# Patient Record
Sex: Male | Born: 1960
Health system: Southern US, Community
[De-identification: ages and names within clinical notes are randomized; demographics above are authoritative.]

## PROBLEM LIST (undated history)

## (undated) DIAGNOSIS — I10 Essential (primary) hypertension: Secondary | ICD-10-CM

## (undated) DIAGNOSIS — R5383 Other fatigue: Principal | ICD-10-CM

## (undated) DIAGNOSIS — I2510 Atherosclerotic heart disease of native coronary artery without angina pectoris: Secondary | ICD-10-CM

## (undated) DIAGNOSIS — E785 Hyperlipidemia, unspecified: Secondary | ICD-10-CM

## (undated) DIAGNOSIS — D751 Secondary polycythemia: Secondary | ICD-10-CM

## (undated) DIAGNOSIS — E119 Type 2 diabetes mellitus without complications: Secondary | ICD-10-CM

## (undated) DIAGNOSIS — I214 Non-ST elevation (NSTEMI) myocardial infarction: Secondary | ICD-10-CM

## (undated) DIAGNOSIS — M109 Gout, unspecified: Secondary | ICD-10-CM

## (undated) DIAGNOSIS — Z9861 Coronary angioplasty status: Secondary | ICD-10-CM

## (undated) DIAGNOSIS — N200 Calculus of kidney: Secondary | ICD-10-CM

## (undated) DIAGNOSIS — E781 Pure hyperglyceridemia: Secondary | ICD-10-CM

## (undated) HISTORY — DX: Calculus of kidney: N20.0

## (undated) HISTORY — DX: Non-ST elevation (NSTEMI) myocardial infarction: I21.4

## (undated) HISTORY — PX: APPENDECTOMY: SHX54

## (undated) HISTORY — DX: Type 2 diabetes mellitus without complications: E11.9

## (undated) HISTORY — PX: CHOLECYSTECTOMY: SHX55

## (undated) HISTORY — DX: Gout, unspecified: M10.9

## (undated) HISTORY — DX: Coronary angioplasty status: Z98.61

## (undated) HISTORY — DX: Other fatigue: R53.83

## (undated) HISTORY — PX: COLECTOMY: SHX59

## (undated) HISTORY — PX: OTHER SURGICAL HISTORY: SHX169

## (undated) HISTORY — PX: HERNIA REPAIR: SHX51

## (undated) HISTORY — DX: Atherosclerotic heart disease of native coronary artery without angina pectoris: I25.10

## (undated) HISTORY — DX: Pure hyperglyceridemia: E78.1

## (undated) HISTORY — DX: Essential (primary) hypertension: I10

## (undated) HISTORY — DX: Secondary polycythemia: D75.1

## (undated) HISTORY — DX: Hyperlipidemia, unspecified: E78.5

---

## 2000-09-06 ENCOUNTER — Emergency Department (HOSPITAL_COMMUNITY): Admission: EM | Admit: 2000-09-06 | Discharge: 2000-09-06 | Payer: Self-pay | Admitting: *Deleted

## 2000-09-06 ENCOUNTER — Inpatient Hospital Stay (HOSPITAL_COMMUNITY): Admission: EM | Admit: 2000-09-06 | Discharge: 2000-09-07 | Payer: Self-pay | Admitting: Internal Medicine

## 2000-09-06 ENCOUNTER — Encounter: Payer: Self-pay | Admitting: *Deleted

## 2000-09-07 ENCOUNTER — Encounter: Payer: Self-pay | Admitting: Urology

## 2000-09-09 ENCOUNTER — Emergency Department (HOSPITAL_COMMUNITY): Admission: EM | Admit: 2000-09-09 | Discharge: 2000-09-09 | Payer: Self-pay | Admitting: Emergency Medicine

## 2001-05-02 ENCOUNTER — Ambulatory Visit (HOSPITAL_COMMUNITY): Admission: RE | Admit: 2001-05-02 | Discharge: 2001-05-02 | Payer: Self-pay | Admitting: Internal Medicine

## 2001-05-06 ENCOUNTER — Inpatient Hospital Stay (HOSPITAL_COMMUNITY): Admission: AD | Admit: 2001-05-06 | Discharge: 2001-05-08 | Payer: Self-pay | Admitting: Internal Medicine

## 2001-05-06 ENCOUNTER — Encounter (INDEPENDENT_AMBULATORY_CARE_PROVIDER_SITE_OTHER): Payer: Self-pay | Admitting: Internal Medicine

## 2001-05-09 ENCOUNTER — Inpatient Hospital Stay (HOSPITAL_COMMUNITY): Admission: EM | Admit: 2001-05-09 | Discharge: 2001-05-14 | Payer: Self-pay | Admitting: *Deleted

## 2001-05-09 ENCOUNTER — Encounter: Payer: Self-pay | Admitting: Internal Medicine

## 2001-05-13 ENCOUNTER — Encounter (INDEPENDENT_AMBULATORY_CARE_PROVIDER_SITE_OTHER): Payer: Self-pay | Admitting: Internal Medicine

## 2001-05-14 ENCOUNTER — Encounter (INDEPENDENT_AMBULATORY_CARE_PROVIDER_SITE_OTHER): Payer: Self-pay | Admitting: Internal Medicine

## 2002-08-11 ENCOUNTER — Emergency Department (HOSPITAL_COMMUNITY): Admission: EM | Admit: 2002-08-11 | Discharge: 2002-08-11 | Payer: Self-pay | Admitting: Emergency Medicine

## 2002-08-11 ENCOUNTER — Encounter: Payer: Self-pay | Admitting: Emergency Medicine

## 2002-12-31 ENCOUNTER — Emergency Department (HOSPITAL_COMMUNITY): Admission: EM | Admit: 2002-12-31 | Discharge: 2002-12-31 | Payer: Self-pay | Admitting: *Deleted

## 2004-07-31 ENCOUNTER — Ambulatory Visit: Payer: Self-pay | Admitting: Internal Medicine

## 2004-11-17 ENCOUNTER — Ambulatory Visit (HOSPITAL_COMMUNITY): Admission: RE | Admit: 2004-11-17 | Discharge: 2004-11-17 | Payer: Self-pay | Admitting: Internal Medicine

## 2005-08-28 ENCOUNTER — Emergency Department (HOSPITAL_COMMUNITY): Admission: EM | Admit: 2005-08-28 | Discharge: 2005-08-29 | Payer: Self-pay | Admitting: Emergency Medicine

## 2005-08-29 ENCOUNTER — Inpatient Hospital Stay (HOSPITAL_COMMUNITY): Admission: AD | Admit: 2005-08-29 | Discharge: 2005-09-03 | Payer: Self-pay | Admitting: Internal Medicine

## 2005-08-31 ENCOUNTER — Ambulatory Visit: Payer: Self-pay | Admitting: Internal Medicine

## 2006-02-06 ENCOUNTER — Ambulatory Visit: Payer: Self-pay | Admitting: Internal Medicine

## 2006-04-13 ENCOUNTER — Ambulatory Visit: Payer: Self-pay | Admitting: Internal Medicine

## 2006-04-13 ENCOUNTER — Inpatient Hospital Stay (HOSPITAL_COMMUNITY): Admission: EM | Admit: 2006-04-13 | Discharge: 2006-04-16 | Payer: Self-pay | Admitting: Emergency Medicine

## 2007-03-21 ENCOUNTER — Ambulatory Visit (HOSPITAL_COMMUNITY): Admission: RE | Admit: 2007-03-21 | Discharge: 2007-03-21 | Payer: Self-pay | Admitting: Internal Medicine

## 2009-01-17 ENCOUNTER — Ambulatory Visit (HOSPITAL_COMMUNITY): Admission: RE | Admit: 2009-01-17 | Discharge: 2009-01-17 | Payer: Self-pay | Admitting: Chiropractic Medicine

## 2009-09-05 ENCOUNTER — Ambulatory Visit (HOSPITAL_COMMUNITY)
Admission: RE | Admit: 2009-09-05 | Discharge: 2009-09-05 | Payer: Self-pay | Admitting: Physical Medicine and Rehabilitation

## 2009-09-16 ENCOUNTER — Ambulatory Visit (HOSPITAL_COMMUNITY)
Admission: RE | Admit: 2009-09-16 | Discharge: 2009-09-16 | Payer: Self-pay | Admitting: Physical Medicine and Rehabilitation

## 2010-07-07 NOTE — Consult Note (Signed)
NAMETEODOR, PRATER               ACCOUNT NO.:  [id number]   MEDICAL RECORD NO.:  [id number]          PATIENT TYPE:  INP   LOCATION:  A301                          FACILITY:  [facility]   PHYSICIAN:  R. Roetta Sessions, M.D. DATE OF BIRTH:  04/01/1960   DATE OF CONSULTATION:  08/31/2005  DATE OF DISCHARGE:                                   CONSULTATION   REASON FOR CONSULTATION:  Small bowel obstruction.   HISTORY OF PRESENT ILLNESS:  [name] is a pleasant 50 year old  Caucasian male admitted to the hospital on August 29, 2005, with a 24-hour  history of abdominal distention, pain, nausea, vomiting, and increased stool  frequency.  This gentleman was in his usual state of health status until 24  hours prior to admission when he developed the above-mentioned symptoms. He  ate some baked chicken the day before he got ill. This was fixed for some  period of time and he had a second helping at supper time. He went to work  and started having some abdominal pain cramps and the above mentioned  symptoms. He did not have any fever or chills. He came to the  emergency  department and was ultimately admitted. Plain films demonstrated a partial  small bowel obstruction as did CT scan.   He has been treated symptomatically with Dilaudid and Zofran/Phenergan.  He  has refused an NG tube.   He developed some GI upset with Cipro orally previously and refused to take  Cipro.  His antibiotic regimen includes IV Flagyl alone at this time.  Stool  studies from August 30, 2005, demonstrated rare white cells, CF toxin assay  negative.  Culture is negative. Thus far, white count 9.2 on admission. H&H  15.7 and 44.9.  LFTs, amylase and lipase were not elevated. His T-max has  been 99 since admission to the hospital.  CT of the abdomen today,  interpreted by Dr. [name], revealed worsening of small bowel obstruction  picture  with small bowel edema and free fluid.  I reviewed the CT  personally with Dr.  Valentino Nose this evening and indeed it does show  impressive diffuse small bowel edema and free fluid. It was notable that  there was no transition point and edematous small bowel all the way down to  his J-pouch anal anastomosis.  This gentleman is status post total  proctocolectomy at [facility] back in 2003 for fulminant ulcerative  colitis. He had a stormy postoperative course and what sounds like he had a  wound dehiscence and had to have a staged subsequent procedure with  ileostomy takedown and J-pouch construction/ileoanal anastomosis.   He is status post cholecystectomy. It is notable that scrutinizing the CT  film closely this evening there is no evidence of vascular  compromise/insufficient length of venous or arterial side and there is no  transition point as well.   PAST MEDICAL HISTORY:  As outlined above.  1.  History of gastroesophageal reflux disease, takes Prilosec      intermittently.  2.  Hyperlipidemia, on Tricor.   The patient presented in March  2003 here with ulcerative colitis __________  over at [facility].  He had surgery by Dr. Lorne Skeens.  He has been  followed in six month and now yearly intervals by Dr. Byrd Hesselbach. He is felt to  be doing very well from his surgery and subsequent revision.  He has a  history of undergoing colonoscopy by Dr. 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   He had been passing blood for some time prior to his diagnosis of UC.   Past medical history is also significant for herniorrhaphy.   MEDICATIONS ON ADMISSION:  1.  Prilosec p.r.n.  2.  Tricor.   ALLERGIES:  DOXYCYCLINE and MORPHINE.   FAMILY HISTORY:  Negative for chronic GI or liver illness.   SOCIAL HISTORY:  The patient lives here in [place] and he works for  [employer].  He has one 30 year old son.  He has two  grandchildren with whom he is in the process of adopting. He does not use   tobacco products or alcohol.  No illicit drugs.  Has supportive family.   REVIEW OF SYSTEMS:  As in history of present illness. He generally  has 8-10  watery bowel movements daily and rarely takes Lomotil (not mentioned above),  frequency of bowel movements increased just minimally recently.  He has not  been passing any blood per rectum.  He denies any chest pain, dyspnea on  exertion. He has gained back the 70 pounds he lost at the time of surgery  back in 2003/2004.   PHYSICAL EXAMINATION:  GENERAL: A 50 year old resting comfortably in the  hospital room accompanied by his wife.  VITAL SIGNS:  Temperature 98.4, pulse 82, respiratory rate 20, BP 123/77.  Weight on admission 254.5.  SKIN: Warm and dry. There is no jaundice. No cutaneous stigmata or chronic  liver disease.  HEENT:  No scleral icterus. Conjunctiva pink. Oral cavity with no lesions.  CHEST:  Lungs clear to auscultation.  HEART:  Regular rate and rhythm, without murmurs, rubs, or gallops.  ABDOMEN:  Full.  He has an ileostomy scar and a wide laparotomy scar, well  healed. His abdomen is fully distended.  His bowel sounds are markedly  diminished.  He has mild diffuse tenderness, voluntary guarding. He does not  have any rebound tenderness. No obvious mass or organomegaly.  EXTREMITIES: No edema.   LABORATORY DATA:  CBC as stated above.  Sodium 137, potassium 3.9, chloride  101, CO2 28, glucose 150, BUN 50, creatinine 1.0, calcium 9.7, total protein  7.4, albumin 4.6, AST 33, ALT 27,  alkaline phosphatase 66, total bilirubin  1.1, amylase 27, lipase 17.   IMPRESSION:  Calvin Cruz is a very pleasant 50 year old gentleman, status post  total proctocolectomy and a J-pouch ileal anastomosis secondary to fulminant  ulcerative colitis. He has done very well from the standpoint of bowel  surgery until recently when he developed acute illness characterized by abdominal distention, nausea and vomiting, some increased stool  output,  radiographic evidence of diffuse small bowel process.   In all likelihood he has an infectious enteritis which is really producing  more of a clinically pseudo-obstruction picture than a true mechanical  obstruction per se.  There is nothing to  suggest vascular compromise,  either a venous or arterial side.  Likewise, there is no transition point.  This appears to be a diffuse process and most likely represents food-borne  illness.   His antibiotic  regimen includes Flagyl only.   I had a lengthy discussion with the patient and his wife at the bedside  about their aversion to NG tube placement, although they had no untoward  problems related to NG tube placement at [facility]. They recall that NG tube  placement is associated with a fairly horrific 40-day hospitalization when  Calvin Cruz was quite ill. In addition, his resistance to Cipro stems to some  nausea that both he (and his wife) have experienced taking Cipro previously.  In no way am I given a history of anything that is remotely concerning for  an allergic reaction with this agent.   RECOMMENDATIONS:  I explained to Calvin Cruz and his wife that placing an NG  tube, how likely will decompress this upstream small bowel and given him  significant relief, although an NG tube may not be very comfortable in the  nasal or oropharynx. It is a temporary maneuver.  It is for the greater good  of his bowel condition at this point in time. In addition, I feel that we  should go ahead and start him on IV Cipro forthwith as a bacterial infection  is likely the primary problem.   The fact that he has white cells in his stool is not particularly reassuring  as white cells are usually produced from inflammation of the colon and are  not typically produced from the small bowel.  Stool culture has been  negative thus far and his CF toxin assay has come back negative, it has been  noted.  I have instructed the nursing staff to go ahead and  give him  premedicating Cipro of 0.5 mg and Dilaudid IV, and given him Ativan 1 mg IV.  Will go ahead and subsequently place a small NG tube intermittently while  suctioning and will cut his p.o. intake back to ice chips  and will take off  his IV fluids.   Will start Cipro 400 mg IV q.12h. with the first dose being administered  immediately.   I see no need for surgical consultation at this time.   I would like to thank Dr. [name] for allowing this nice gentleman to see  me.      [name], M.D.  Electronically Signed     RMR/MEDQ  D:  08/31/2005  T:  08/31/2005  Job:  [id number]

## 2010-07-07 NOTE — Discharge Summary (Signed)
[facility]  Patient:    Calvin Cruz, Calvin Cruz Visit Number: [id number] MRN: [id number]          Service Type: MED Location: 3A Y782 [id number] Attending Physician:  [name] Dictated by:   Lionel December, M.D. Admit Date:  05/09/2001 Discharge Date: 05/14/2001   CC:         [name], M.D.   Discharge Summary  DISCHARGE DIAGNOSES: 1. Fulminant pan ulcerative colitis refractory to medical therapy. 2. Impending toxic megacolon. 3. Reflux esophagitis. 4. Hyperlipidemia. 5. Mild hypokalemia.  DISPOSITION:  The patient was transferred to ______ on May 14, 2001, to the care of Dr. [name] of gastroenterology service.  HISTORY OF PRESENT ILLNESS:  The patient is a 50 year old Caucasian male who underwent a total colonoscopy on May 02, 2001, and was found to have fairly severe ulcerative colitis which was diffuse.  He was begun on prednisone, Cipro, and Flagyl.  His stool studies subsequently came back negative.  He was admitted with nausea, vomiting on May 06, 2001, and discharged on May 08, 2001.  His nausea or vomiting were felt to be due to his medications.  He returned one day after being discharged.  At this time he was complaining of abdominal pain, relapse of nausea and vomiting, and still had bloody diarrhea. He was admitted by Dr. [name].  LABORATORY DATA:  His laboratory studies revealed WBC of 15.9, H&H of 16 and 46.7, platelet count of 443,000.  He had 64% neutrophils, but no bands.  His sedimentation rate was 40.  Sodium was 134, potassium 3.4, chloride 109, CO2 was 22, glucose 129, BUN 13, creatinine 1, calcium 8, total protein 5.6, with albumin of 2.8.  Acute abdominal series revealed normal gas pattern without pneumoperitoneum, air fluid levels, or obstruction.  Chest x-ray was within normal limits.  HOSPITAL COURSE:  The patient was maintained on clear liquids and switched to IV steroids and given Nubain for pain  control.  Because of continued pain, abdominopelvic CT was obtained on May 09, 2001.  It showed changes of diffuse colitis with sparing of cecum and rectum.  These changes were most pronounced involving the descending and sigmoid colon.  He had a scant amount of ascites, and there was no evidence of urinary tract calculi or hydronephrosis.  His therapy was continued, and he appeared to be gradually getting better.  He was having less pain, and his nausea and vomiting resolved.  His WBC dropped to 13.1 on the day after admission and, on March 24 and May 14, 2001, was normal.  However, when he was begun on a low-residue diet he developed more pain and started to throw up again.  He was still having diarrhea which was liquid but without frank blood.  Stool studies were repeated.  The C. difficile toxin titer was negative.  Culture was also negative, but O&P revealed a few Blastocystis hominis.  I felt that this was not a pathogen but decided to treat him with IV Flagyl.  After initial improvement, his condition regressed.  He was having more pain in his lower abdomen and also had a low-grade temperature.  I also did upper abdominal ultrasound to make sure he did not have biliary tract disease, and this was normal.  His acute abdominal series was repeated on May 14, 2001, and now he had developed a distention of his colon with a few air fluid levels in it. There was some gas in small bowel.  There was no free  air.  This finding was completely new compared to his previous finding.  I was concerned that he had failed medical therapy and was heading towards toxic megacolon.  I was also concerned that he could end up with perforation due to fulminant colitis and felt that may be heading toward surgery.  His condition was reviewed with his family members, and it was decided to transfer him to the care of Dr. Steele Sizer, a gastroenterologist at ______ with expertise in inflammatory bowel  disease.  The patient was transferred to his service on May 14, 2001. ictated by:   Lionel December, M.D. Attending Physician:  [name] DD:  05/23/01 TD:  05/24/01 Job: [id number]

## 2010-07-07 NOTE — Discharge Summary (Signed)
NAMETORRY, ISTRE               ACCOUNT NO.:  [id number]   MEDICAL RECORD NO.:  [id number]          PATIENT TYPE:  INP   LOCATION:  A301                          FACILITY:  [facility]   PHYSICIAN:  Kingsley Callander. Ouida Sills, MD       DATE OF BIRTH:  January 04, 1961   DATE OF ADMISSION:  08/29/2005  DATE OF DISCHARGE:  07/16/2007LH                                 DISCHARGE SUMMARY   DISCHARGE DIAGNOSES:  1.  Gastroenteritis.  2.  History of ulcerative colitis.  3.  Hypertriglyceridemia.  4.  Hypokalemia.   HOSPITAL COURSE:  This patient is a 51 year old white male with a history of  colectomy for ulcerative colitis who presented with abdominal pain, nausea,  vomiting and diarrhea.  He was treated with IV fluids, antibiotics and  analgesics.  A CT scan had revealed findings suggestive of partial small-  bowel obstruction and acute abdominal series on August 29, 2005, revealed  improvement actually compared to films from an ER visit 2 days earlier.  He  had increased symptoms of pain though on August 31, 2005, and had worsening  small bowel obstructive changes by x-ray. An NG tube could not be placed due  to his nasal anatomy and level of discomfort.  He was kept n.p.o.  He was  seen in gastroenterology consultation by Dr. [name].  He was treated with IV  Cipro and Flagyl.  He gradually improved with diminished abdominal  discomfort and diminished distension.  He had loose stools.  A stool for  white cells revealed rare PMNs.  His C.  diff toxin was negative.  Stool  culture has thus far been negative.   As he improved, his diet was advanced.  He tolerated clear liquids and then  a low-residue diet   His repeat white count was 4.9.  He had a mild hypokalemia of 3.4 and was  supplemented intravenously.  He was improved and stable for discharge by the  evening of September 03, 2005.  He will have follow-up in the office in 1 week.   DISCHARGE MEDICATIONS:  1.  Lortab q.4h. p.r.n.  2.  Cipro 500 mg b.i.d.  3.   Flagyl 500 mg t.i.d.   He was encouraged to continue a low-residue diet.  He will hold Tricor for  now.      Kingsley Callander. Ouida Sills, MD  Electronically Signed     ROF/MEDQ  D:  09/03/2005  T:  09/04/2005  Job:  [id number]

## 2010-07-07 NOTE — Group Therapy Note (Signed)
NAMEDILLINGER, ASTON               ACCOUNT NO.:  [id number]   MEDICAL RECORD NO.:  [id number]          PATIENT TYPE:  INP   LOCATION:  A301                          FACILITY:  [facility]   PHYSICIAN:  Kingsley Callander. Ouida Sills, MD       DATE OF BIRTH:  October 04, 1960   DATE OF PROCEDURE:  08/31/2005  DATE OF DISCHARGE:                                   PROGRESS NOTE   Calvin Cruz has had some additional discomfort and some heaving overnight.  He has  not been febrile.  He has had a couple of loose stools which have not been  bloody.   Temperature 98.6, pulse 84, respirations 20, blood pressure 124/83.  Chest  clear.  Heart regular with no murmurs.  Abdomen more distended, minimally  tender.  No hepatosplenomegaly or palpable mass.   IMPRESSION:  Ileus versus partial small-bowel obstruction.  Flat and upright  abdominal films reveal worsening.  An NG tube is being placed.  Will  continue Flagyl and p.r.n. Zofran and IV fluids.  A surgical consultation  will be obtained if necessary.  His MET-7 today is normal except for a  glucose of 116, which is actually improved from his prior values of 187 and  150.      Kingsley Callander. Ouida Sills, MD  Electronically Signed     ROF/MEDQ  D:  08/31/2005  T:  09/01/2005  Job:  [id number]

## 2010-07-07 NOTE — Discharge Summary (Signed)
[facility]  Patient:    Calvin Cruz, Calvin Cruz Visit Number: [id number] MRN: [id number]          Service Type: MED Location: 3A Y782 01 Attending Physician:  [name] Dictated by:   Lionel December, M.D. Admit Date:  05/09/2001 Discharge Date: 05/14/2001   CC:         [name], M.D.   Discharge Summary  DISCHARGE DIAGNOSES: 1. Acute ulcerative colitis. 2. Nausea and vomiting possibly secondary to antibiotic therapy. 3. Reflux esophagitis. 4. Hyperlipidemia.  CONDITION AT TIME OF DISCHARGE:  Improved and stable.  DISCHARGE MEDICATIONS: 1. Prednisone 40 mg q.a.m. 2. Bentyl 20 mg t.i.d. 3. Prilosec 30 mg q.a.m. 4. Flagyl 250 mg b.i.d. for one week. 5. Imodium 2 mg q.i.d. p.r.n.  HOSPITAL COURSE:  The patient is a 50 year old Caucasian male who presented with a rectal bleeding and diarrhea and was diagnosed with a fairly severe pan ulcerative colitis on May 02, 2001. While results of stool studies and biopsy were pending, he was discharged on prednisone, Cipro, and Flagyl. One day before his office visit he developed nausea and vomiting and also was having copious diarrhea and therefore hospitalized. When his wife called me from home, I was concerned that he may be developing toxic megacolon. However, on initial evaluation he did not appear to be toxic or acutely ill. His abdominal examination revealed active bowel sounds, and he had mild to moderate tenderness in both iliac fossa as to his epigastric area. Acute abdominal series showed nonspecific gas pattern. There was no evidence of air-fluid levels or dilated colon. His WBC was 11.0, H&H was 15.3 and 42.6. He had 63% neutrophils but no bands. His chemistry panel was normal. He was treated with IV fluids and IV steroids, initially made n.p.o. He was begun on Rowasa enemas as well as Levsin sublingual. By the next morning, he was feeling much better and begun on low residue diet.  Flagyl  was added. He was also given IV Protonix initially and this was changed to p.o. We also changed his IV steroid to prednisone. By day #2 he was feeling even better. He was tolerating his diet. He was having some cramps and diarrhea but no nausea and vomiting. His lab studies were repeated. His WBC was 8.8, H&H was 14.2 and 42.2, platelet count was 359,000. I felt that his nausea and vomiting was possibly due to Cipro.  DISPOSITION:  He was discharged on May 08, 2001 to be followed in the office on May 14, 2001. Dictated by:   Lionel December, M.D. Attending Physician:  [name] DD:  05/23/01 TD:  05/24/01 Job: [id number]

## 2010-07-07 NOTE — H&P (Signed)
[name], Cruz               ACCOUNT NO.:  [id number]   MEDICAL RECORD NO.:  [id number]          PATIENT TYPE:  OBV   LOCATION:  A301                          FACILITY:  [facility]   PHYSICIAN:  Edward L. Juanetta Gosling, M.D.DATE OF BIRTH:  17-Mar-1960   DATE OF ADMISSION:  08/29/2005  DATE OF DISCHARGE:  LH                                HISTORY & PHYSICAL   PRIMARY CARE PHYSICIAN:  Patient of Dr. Ouida Sills.   REASON FOR ADMISSION:  Possible food poisoning.   HISTORY:  Calvin Cruz is a 50 year old who came to the emergency room last  night with complaints of abdominal pain, nausea, and vomiting.  He had  diarrhea but he always has diarrhea.  His pain was rated at that time as  moderate to severe but it resolved apparently while he was in the emergency  room and then he was discharged home.  However after he was going home he  had more problems with pain and nausea, called Dr. Karilyn Cota who is his  gastroenterologist, Dr. Karilyn Cota told him that perhaps he should be in the  hospital.  He then called Dr. Ouida Sills who is his regular medical doctor but  Dr. Ouida Sills was out so I had him come to the hospital.   PAST MEDICAL HISTORY:  His past medical history is positive for ulcerative  colitis and in 2003 he had a severe episode of ulcerative colitis and  eventually culminated in him having a colon resection and he says he is left  with a J pouch.  He has had an umbilical hernioplasty as well.  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   ALLERGIES:  HE IS ALLERGIC TO DOXYCYCLINE AND MORPHINE.   SOCIAL HISTORY:  He lives with his family.  He does not smoke.  He does not  drink any alcohol.  He is employed and actually got sick at work.  He thinks  that he got sick after eating some chicken that was baked and he thinks he  got food poisoning.   PHYSICAL EXAMINATION:  His physical examination here shows a well-developed,  well-nourished male who appears to be fairly comfortable, although  he says  he is still nauseated and still having some pain that he rates at about a  three.  His temperature is 99, pulse is 99, respirations 20, blood pressure  130/85, O2 saturation is 94%, height listed at 66 inches which I think is  incorrect, weight 254.5.  His pupils are reactive.  Nose and throat are  clear.  His neck is supple without masses.  His chest is clear without  wheezes, rales or rhonchi.  His mucous membranes are slightly dry.  His  pupils are reactive.  His abdomen has multiple surgical scars and is mildly  tender mostly in the right lower quadrant.  His central nervous system  examination is grossly intact.  Abdominal exam shows that his bowel sounds  are present and mildly hyperactive.  I did not do a rectal examination.   LABORATORY WORK DONE LAST NIGHT:  His glucose was 187, SGOT  was 38, the rest  of his comp metabolic profile was normal.  White count 11,700, hemoglobin  16.2, platelets 300,000.  He had a CT abdomen in the emergency room and  apparently was told by Dr. Karilyn Cota that Dr. Karilyn Cota had looked at the films  and it was consistent with food poisoning.  A flat and upright abdominal  film was at least somewhat suggestive of a small-bowel obstruction.  CT  abdomen read as complete colectomy wall thickening and multiple distal small  bowel loops consistent with enteritis, mild surrounding mesenteric edema,  mild adjacent ascites, possibly ileus.   PLAN:  He says that in the past he has taken Flagyl when he has gotten sick.  If he takes Cipro with it, it tends to make him worse so he is not wanting  to take Cipro.  My plan is to go ahead and get some more labs tonight, give  him a clear liquid diet, give him Flagyl, Dilaudid in low dose for pain and  Zofran for nausea.      Edward L. Juanetta Gosling, M.D.  Electronically Signed     ELH/MEDQ  D:  08/29/2005  T:  08/29/2005  Job:  [id number]

## 2010-07-07 NOTE — Discharge Summary (Signed)
Calvin Cruz, Calvin Cruz               ACCOUNT NO.:  [id number]   MEDICAL RECORD NO.:  [id number]          PATIENT TYPE:  INP   LOCATION:  A325                          FACILITY:  [facility]   PHYSICIAN:  Skeet Latch, DO    DATE OF BIRTH:  Apr 23, 1960   DATE OF ADMISSION:  04/13/2006  DATE OF DISCHARGE:  02/26/2008LH                               DISCHARGE SUMMARY   DISCHARGE DIAGNOSES:  1. Viral gastroenteritis.  2. History of ulcerative colitis.  3. Gastroesophageal reflux disease.   CONSULTANTS:  Dr.  Karilyn Cota.   BRIEF HOSPITAL COURSE:  Calvin Cruz is a 50 year old Caucasian male who  has a history of ulcerative colitis and hemicolectomy who presented with  a 3 day history of nausea, vomiting and diarrhea.  The patient states  that he had a fever.  Called his primary care physician who gave him  Phenergan without relief.  The patient states these symptoms continued  and he was brought to the hospital for evaluation.  The patient was seen  and with his history of ulcerative colitis with hemicolectomy it was  decided the patient needed to be admitted for evaluation.  The patient  had an acute abdominal series performed which was suspicious for small  bowel obstruction.  The patient had a CT of his abdomen performed which  was negative for any bowel obstruction.  This showed his status post  colectomy and his cholecystectomy, but no acute disease.  The patient  was maintained on IV fluids and kept n.p.o.  The patient's diet was  slowly increased to a full liquid diet and was advanced as tolerated.  Today the patient did have a regular breakfast and lunch and now the  patient is stable for discharge.   TESTS:  The patient had a C-diff toxin performed which was negative.  The patient also had a stool culture which was reintubated for better  growth, so pending result.   VITALS ON DISCHARGE:  Temperature 97.9, pulse 64, respirations 20, blood  pressure is 101/71.  The patient is sating 97%  on room air.   MEDICATIONS ON DISCHARGE:  1. Tricor 145 mg daily.  2. Prilosec 40 mg p.o. daily.  3. Phenergan 25 mg p.o. q.4-6h. p.r.n.  4. Levsin 0.125 mg sublingual q.i.d. p.r.n.   DISCHARGE CONDITION:  Stable.   DISPOSITION:  Discharge to home.   INSTRUCTIONS:  The patient can resume regular activities as tolerated.  The patient is to maintain a regular diet, but to start off with bland  foods and then increase as tolerated.  The patient to follow up with Dr.  Ouida Sills in 1-2 weeks. The patient is also instructed to follow up with Dr.  Patty Sermons office in the next month if abdominal pain continues.  The  patient understands these directions and agrees.      Skeet Latch, DO  Electronically Signed     SM/MEDQ  D:  04/16/2006  T:  04/16/2006  Job:  [id number]   cc:   Kingsley Callander. Ouida Sills, MD  Fax: [phone]

## 2010-07-07 NOTE — H&P (Signed)
[facility]  Patient:    Calvin Cruz, Calvin Cruz Visit Number: [id number] MRN: [id number]          Service Type: MED Location: 3A [phone] Attending Physician:  Rosalyn Charters Dictated by:   Tana Coast, P.A. Admit Date:  05/09/2001   CC:         [name], M.D.   History and Physical  DATE OF BIRTH:  Sep 21, 1960  CHIEF COMPLAINT:  Nausea, vomiting, abdominal pain and bloody diarrhea.  HISTORY OF PRESENT ILLNESS:  The patient is a 50 year old gentleman well-known to our practice from recent evaluation for a several-week history of bloody diarrhea.  He underwent a total colonoscopy on May 02, 2001 and was found to have severe colitis involving all of the colon and the rectum.  His TI was normal.  Stool culture at that time was negative.  He was felt to have UC and was begun on prednisone, Cipro and Flagyl.  He was seen on May 06, 2001 with intractable nausea and vomiting and was admitted to the hospital for this. Patient was evaluated for possible toxic megacolon, however, there was no evidence of this on acute abdominal series.  He quickly improved and actually went home yesterday.  He went home on prednisone 40 mg q.d., Bentyl 20 mg t.i.d., Prevacid 30 mg q.d., Flagyl 500 mg b.i.d., Imodium 2 mg p.r.n. Patient did very well yesterday and was able to tolerate a regular diet.  He woke up about 12 a.m. this morning with mild abdominal cramping which progressed throughout the day.  He called our office and we recommended Phenergan 25 mg p.o. q.4h. p.r.n. at that point.  Several hours later, his wife called back stating that he began vomiting and had vomited at least three or four episodes.  He continued to have three to four episodes of moderate-volume hematochezia as well.  He had one episode of syncope.  Patient was advised to come to the emergency department for further evaluation.  In the emergency department, he was found to have moderate left  lower quadrant/left flank pain on examination; he also had moderate left CVA tenderness.  He had diffuse lower abdominal discomfort with palpation as well. No organomegaly or masses.  His rectal temperature was 101.1, pulse 102, blood pressure 107/74.  He was pale and clammy.  Laboratories revealed a white count of 15.9 with 64% neutrophils, 22% lymphocytes, hemoglobin 16.9, hematocrit 46.7, platelets 443,000; sodium 134, potassium 3.4, BUN 13, creatinine 1, glucose 129, amylase 34, lipase 25, albumin 2.8, total bilirubin 0.5, alkaline phosphatase 41, SGOT 16, SGPT 15.  Urinalysis revealed trace ketones, trace protein, positive nitrite.  Acute abdominal series revealed no acute disease.  He was begun on IV normal saline and received Phenergan 25 mg IV (requested IM) and Demerol 50 mg IV.  The patients nausea and abdominal pain improved.  MEDICATIONS:  1. Prednisone 40 mg p.o. q.d.  2. Bentyl 20 mg p.o. t.i.d.  3. Imodium 2 mg q.i.d. p.r.n.  4. Phenergan 25 mg p.o. q.4h. p.r.n.  5. Prevacid 30 mg p.o. q.d.  6. Flagyl 500 mg p.o. b.i.d.  ALLERGIES:  DOXYCYCLINE causes rash.  PAST MEDICAL HISTORY:  In December 2001, he had a total colonoscopy by Dr. Franky Macho and had a large polyp removed from the rectosigmoid area which had intramucosal carcinoma and high-grade dysplasia.  No polyps were found on recent colonoscopy by Dr. Lionel December.  He has hyperlipidemia, intermittent gastroesophageal reflux disease.  History of urinary  tract stone requiring lithotripsy in June 2002.  He has had umbilical hernia repair.  FAMILY HISTORY:  Mother has diabetes, hypertension, coronary artery disease, status post CABG.  Father died of lung cancer.  He has a brother who is a diabetic and one who is an alcoholic.  He denies any chronic history of GI illnesses or colorectal cancer.  SOCIAL HISTORY:  He has been married for 23 years.  He has one child.  He is employed with [employer].  He currently  smokes 15 cigarettes daily and has been smoking since age 24.  He denies alcohol use.  REVIEW OF SYSTEMS:  Please see HPI for GI.  GENERAL:  One episode of syncope. Denies any injury.  GENITOURINARY:  Denies any dysuria or hematuria.  History of renal stones.  RESPIRATORY:  Denies any shortness of breath.  CARDIAC: Denies any chest pain.  PHYSICAL EXAMINATION:  VITAL SIGNS:  Temperature 101.1, rectally; pulse 102; respirations 18; blood pressure 107/74.  GENERAL:  A pleasant, well-nourished, well-developed Caucasian male who appears acutely ill.  SKIN:  Pale and clammy.  HEENT:  Conjunctivae are pink.  Sclerae nonicteric.  Oropharyngeal mucosa moist and pink.  No lesions, erythema or exudate.  CHEST:  Lungs clear to auscultation.  CARDIAC:  Regular rate and rhythm.  Normal S1 and S2.  No murmurs, rubs, or gallops.  ABDOMEN:  Positive bowel sounds, nondistended.  Palpation reveals mild-to-moderate tenderness in the left lower quadrant.  Moderate tenderness in the left flank area.  Moderate CVA tenderness.  No organomegaly or masses.  EXTREMITIES:  No cyanosis or edema.  LABORATORIES AND X-RAYS:  Please see HPI.  CT of the abdomen and pelvis pending.  IMPRESSION:  Patient is a 50 year old gentleman with pancolonic ulcerative colitis.  He has had recurrent nausea and vomiting requiring a recent hospitalization (just discharged yesterday).  Previously, it was felt that his nausea and vomiting were secondary to antibiotic therapy and it may very well be related to his Flagyl currently.  Again, there is no evidence of toxic megacolon on acute abdominal series.  With history of ureteral stone previously requiring lithotripsy, would also wonder about possibility of renal colic with resultant nausea and vomiting; hopefully, the screening CT of the abdomen and pelvis will provide useful information.  He continues to have significant bloody diarrhea.  Patient has what appears to be  two separate  types of abdominal pain, one with diffuse abdominal cramping and another with left flank/left back pain.  Again, etiology may be secondary to both his colitis and possibly renal colic.  PLAN:  1. Will admit him for further treatment and evaluation.  IV normal saline     with 20 mEq of Kay Ciel per liter at 100 cc/hr.  2. Nubain 10 mg IV q.4h. p.r.n.  3. Phenergan 25 mg IM q.4h. p.r.n.  4. IV Solu-Medrol 20 mg q.6h.  5. IV Protonix 40 mg q.24h.  6. Bentyl 20 mg p.o. t.i.d. p.r.n.  7. Imodium 2 mg p.r.n. loose stool, not to exceed four daily.  8. Will obtain a MET-7 and CBC in the morning.  9. Stool for O&P and C. difficile. 10. Clear liquids as tolerated. 11. Will hold off antibiotics for now.Dictated by:   Tana Coast, P.A.  Attending Physician:  Rosalyn Charters DD:  05/09/01 TD:  05/10/01 Job: [id number]

## 2010-07-07 NOTE — Consult Note (Signed)
[name], [name]               ACCOUNT NO.:  [id number]   MEDICAL RECORD NO.:  [id number]          PATIENT TYPE:  INP   LOCATION:  A325                          FACILITY:  [facility]   PHYSICIAN:  R. Calvin Cruz, M.D. DATE OF BIRTH:  08-14-1960   DATE OF CONSULTATION:  04/14/2006  DATE OF DISCHARGE:                                 CONSULTATION   REASON FOR CONSULTATION:  Nausea, vomiting, partial small-bowel  obstruction.   HISTORY OF PRESENT ILLNESS:  [name] is a pleasant 50-year-  old Caucasian male with a history of undergoing an urgent total  proctocolectomy for fulminant UC by Dr. Mirian Cruz over at [facility] in 2003 who subsequently had a J-pouch  ileoanal anastomosis created was in his usual state of fair health until  3 days ago when he developed abdominal distention, nausea and vomiting.  He has four to five loose to semiformed nonbloody bowel movements at his  baseline since surgery but over the past 2 days he has passed more green  watery like bowel movements perhaps eight to nine daily.  He has not had  any melena or blood per rectum.  He came to the emergency department  yesterday and was evaluated and subsequently admitted to the hospitalist  service.  Plain films demonstrated a fairly impressive pattern  consistent with a high-grade small-bowel obstruction.  There was no free  air.  The small bowel dilation extended well into what appeared to be  the distal small bowel.   He has not been febrile since hospitalized.  His white count on  admission is 7.8, today 5.0, his H&H were 18.3 and 51.9 on admission,  today 15.6 and 45.2.  He has pretty much normal differential.   He has been treated with IV fluids and antiemetics.  He does not have an  NG tube.  Today he is feeling much better.  It is notable we saw this  gentleman back in July 2007 with nausea and vomiting.  CAT scan  demonstrated picture more consistent with an enteritis  rather than a  small-bowel obstruction.  He responded nicely to a course of Flagyl and  Cipro.  He has done well over the past 6 months, in fact, he has gained  a good 20 pounds by his report.  He did have a problem with what sounds  like an abscess around his old ostomy site which drained spontaneously.  He saw Dr. Karilyn Cruz and Dr. Byrd Cruz for this problem a couple of months  ago.   He really has not had any sick contacts recently, although his son who  does not live with him did have some nausea and vomiting which was self-  limited a week or so ago.   PAST MEDICAL HISTORY:  As stated above fulminant ulcerative colitis  requiring panproctocolectomy at [facility].  He had a total of  three surgeries to get where he is today with a J-pouch ileoanal  anastomosis.  Prior to that he had a colonoscopy by Dr. Lovell Cruz in 2001  which demonstrated carcinoma in situ and  a polyp removed.  Gastroesophageal reflux disease.  Hyperlipidemia.   MEDICATIONS ON ADMISSION:  Prilosec OTC p.r.n. and TriCor.   ALLERGIES:  DOXYCYCLINE AND MORPHINE.   FAMILY HISTORY:  Negative for chronic GI or liver disease.   SOCIAL HISTORY:  The patient lives in [place].  He works third shift  at [employer].  He has a 56 year old son who does not live  with him.  He has grandchildren.  Supportive family.  No tobacco or  alcohol.   REVIEW OF SYSTEMS:  Has not had any fever or chills.  No odynophagia,  dysphagia.  No melena, rectal bleeding.  Weight gain over the past 6  months.  No chest pain, dyspnea on exertion.   PHYSICAL EXAMINATION:  GENERAL:  He appears to be comfortable and says  he is feeling much better today.  His wife is present in the room.  VITAL SIGNS:  Temperature 97.6, pulse 73, respiratory rate 20, BP  125/81.  He weighs 242.8 pounds on admission.  HEENT:  No scleral icterus.  Conjunctivae are pink.  Oral cavity:  No  lesions.  CHEST:  Lungs are clear to auscultation.  CARDIAC  EXAM:  Regular rate and rhythm without murmur, gallop or rub.  ABDOMEN:  He has a well-healed scar from ileostomy takedown previously.  Abdomen is somewhat full.  Bowel sounds are markedly diminished.  There  is increased tympany.  He has mild diffuse tenderness without obvious  mass or organomegaly.  EXTREMITY EXAM:  No edema.   LABORATORY DATA:  CBC today:  White count 5.0, H&H 15.6 and 45.2, MCV  89.9, platelet count 286.  Sodium 139, potassium 4.4, chloride 101, CO2  29, glucose 166, BUN 21, creatinine 1.04, alkaline phosphatase 63, AST  30, ALT 29, total protein 8.5, albumin 5.0, calcium 9.1.  Urinalysis:  Small bilirubin, trace ketones, many squamous epithelial cells, white  count 11-20, rbc's 7-10, bacteria many.   IMPRESSION:  [name] is a very pleasant 50 year old gentleman  admitted to the hospital with nausea, vomiting and basically worsening  of chronic diarrhea.  Plain films of the abdomen suggest a rather high-  grade partial small-bowel obstruction.   This in the setting of someone who has undergone a total proctocolectomy  and J-pouch ileoanal anastomosis related to fulminant ulcerative colitis  some 4 years ago now.   This acute illness is more related to a mechanical bowel obstruction  rather than any enteric or inflammatory bowel process, although he  presented with a somewhat similar illness back in July 2007, more  impressed now that he currently has a mechanical process, he certainly  could have adhesions, the J-pouch anastomosis also could have some  problems with stricture formation, etc.   RECOMMENDATIONS:  1. Agree with symptomatic treatment for his nausea and vomiting.   He has a significant aversion to an NG tube related to a long stormy  hospitalization relating to his emergency panproctocolectomy back in  2003 and we will avoid an NG tube if at all possible.  1. I will proceed with a CT of the abdomen and pelvis with IV and oral       contrast.  Hopefully we will get small bowel contrast down into the      small bowel.   Would really like to see the anastomosis on CT containing an adequate  amount of contrast.   It could be a bit risky proposition to instill contrast per his anus  given the surgical reconstruction  he has had recently.  Hopefully we  will get enough information per oral administered contrast.  I will make  further recommendations in the very near future.   I would like to thank the hospitalist team for allowing me to see this  very nice gentleman.   It is notable that no one called me to notify me of this consultation.  I stumbled upon this consultation when his name popped up on my computer  list at 1430 hours today.      [name], M.D.  Electronically Signed     RMR/MEDQ  D:  04/14/2006  T:  04/14/2006  Job:  [id number]   cc:   Hospitalist P Team   Kingsley Callander. Ouida Sills, MD  Fax: [phone]

## 2010-07-07 NOTE — Group Therapy Note (Signed)
Calvin Cruz, Calvin Cruz               ACCOUNT NO.:  [id number]   MEDICAL RECORD NO.:  [id number]          PATIENT TYPE:  INP   LOCATION:  A325                          FACILITY:  [facility]   PHYSICIAN:  Margaretmary Dys, M.D.DATE OF BIRTH:  Sep 24, 1960   DATE OF PROCEDURE:  04/14/2006  DATE OF DISCHARGE:                                 PROGRESS NOTE   SUBJECTIVE:  The patient feels better today.  He has had little nausea  and has not vomited all day today.  He has had increased diarrhea  frequency, more than his baseline.  He does have a history of ulcerative  colitis and has a jejunal pouch placed.   OBJECTIVE:  GENERAL:  Conscious, alert, comfortable, and in no acute  distress.  VITAL SIGNS:  Stable.  HEENT:  Normocephalic and atraumatic.  Oral mucosa was moist.  There  were no exudates.  NECK:  Supple, no JVD.  LUNGS:  Clear clinically with good air entry bilaterally.  HEART:  S1 and S2 regular.  No S3, S4, gallops, or rubs.  ABDOMEN:  Soft and nontender.  Bowel sounds were positive.  No masses  palpable.  EXTREMITIES:  No edema.   ASSESSMENT:  Likely has acute viral gastroenteritis.  The patient had  nausea and vomiting, diarrhea, and it appears to be improved.  Continue  fluid hydration at this time.  There is no indication for any antibiotic  therapy.  We will observe him for another day or two.  Overall, the  patient is stable.      Margaretmary Dys, M.D.  Electronically Signed     AM/MEDQ  D:  04/14/2006  T:  04/14/2006  Job:  [id number]

## 2010-07-07 NOTE — H&P (Signed)
[facility]  Patient:    Calvin Cruz, Calvin Cruz Visit Number: [id number] MRN: [id number]          Service Type: MED Location: 3A A330 01 Attending Physician:  Malissa Hippo Dictated by:   Lionel December, M.D. Admit Date:  05/06/2001   CC:         [name], M.D.   History and Physical  PRESENTING COMPLAINT:  Nausea and vomiting in a patient recently diagnosed with pan-ulcerative colitis who also has copious diarrhea.  HISTORY OF PRESENT ILLNESS:  Mr. Garriga is a 50 year old Caucasian male who was evaluated in the office last week for bloody diarrhea.  He underwent total colonoscopy on May 02, 2001.  He was found to have severe colitis involving all of the colon and rectum.  His TI was normal.  Stool studies were also obtained at that time.  It was felt that he had UC and was begun on prednisone, Cipro and Flagyl, and was also given prescription for Bentyl to be used on p.r.n. basis for cramps.  He called the office yesterday stating that he was still having several bowel movements per day; he was advised to take Imodium 2 mg q.i.d. p.r.n.  He has been vomiting since 2 oclock this morning. He has been vomiting fluid and has not thrown up any blood.  He has also been having intractable heartburn.  This has gotten worse in the last few days.  He has had this problem intermittently and has been using Maalox.  Only once in the past did he take Prevacid.  He is having a bowel movement almost every hour.  Most stools are loose and watery but every now and then he is seeing some blood.  He does not feel much better since he has been on therapy.  He has not experienced any fever, chills or night sweats.  He is having abdominal pain mainly in his epigastrium and across the lower abdomen.  Review of his weight record reveals that he has lost 10 pounds since he was seen in the office one week ago.  MEDICATIONS:  His usual medications include: 1. Tricor 160 mg  q.d. 2. Lipitor 10 mg q.d. 3. Vitamin E 400 units q.d. 4. Prednisone 40 mg q.a.m. 5. Cipro 500 mg b.i.d. 6. Flagyl 500 mg b.i.d. 7. Bentyl 20 mg t.i.d. p.r.n. 8. He is also on enteric-coated ASA 81 mg q.d., which is on hold. 9. Imodium 2 mg q.i.d. p.r.n.  PAST MEDICAL HISTORY:  He has had umbilical herniorrhaphy.  In December of 2001, he had total colonoscopy by Dr. Franky Macho and had a large polyp removed from rectosigmoid area which had intramucosal carcinoma and high-grade dysplasia.  No polyps were found on recent colonoscopy by me.  He has hyperlipidemia and intermittent symptoms of reflux esophagitis.  ALLERGIES:  None known.  FAMILY HISTORY:  Negative for inflammatory bowel disease.  Father died of lung carcinoma and mother has CAD.  One brother is diabetic.  SOCIAL HISTORY:  He is married and has one child.  He does not ______ but has been smoking about 15 cigarettes per day; he has been doing this for almost 30 years.  PHYSICAL EXAMINATION:  GENERAL:  A well-developed, well-nourished Caucasian male who appears to be acutely ill.  He weighs 229-1/2 pounds.  He is 6-feet 6-inches tall.  VITAL SIGNS:  Pulse 83 per minute, blood pressure 138/90, temperature is 97.1, respirations 20.  HEENT:  Conjunctivae are pink, sclerae nonicteric.  Oropharyngeal  mucosa is unremarkable.  NECK:  Without masses or thyromegaly.  CARDIAC:  Regular rhythm, normal S1 and S2.  No murmur or gallop noted.  LUNGS:  Clear to auscultation.  ABDOMEN:  Symmetrical.  Bowel sounds are normal.  Palpation reveals a soft abdomen with mild-to-moderate tenderness in both iliac fossae as well as epigastric area.  No organomegaly or masses.  RECTAL:  Exam is deferred.  EXTREMITIES:  No clubbing or edema noted.  LABORATORY AND ACCESSORY DATA:  Stool cultures from last week are negative for enteric pathogens and O&P is also negative.  Acute abdominal series reviewed with Dr. Manson Passey.  He has gas  mainly in the colon with a few small air-fluid levels but it is not dilated.  Small bowel is non-dilated.  There is no pneumoperitoneum.  There is some bony abnormality on a chest film which is unchanged from previous x-rays; it is in the left upper hemithorax.  Labs from today:  WBC is 11.0, H&H are 15.3 and 42.6, platelet count is 329,000; he had 63 neutrophils, 6 eos, 22 lymphs, 9 monos.  Sodium 135, potassium 3.5, chloride 105, CO2 is 26, glucose 113, BUN 9, creatinine 1.1, bilirubin is 0.5, AP 44, albumin 3.4, AST 17, ALT 17, calcium is 8.7.  ASSESSMENT:  Patient has acute pan-ulcerative colitis.  He now presents with nausea and vomiting.  I suspect that it is secondary to his antibiotic therapy.  It could also be due to the fact that his gastroesophageal reflux disease has gotten worse with acute illness, antibiotics and antispasmodic. Acute abdominal series does not show any changes of toxic megacolon.  PLAN:  We will treat him with IV fluids with Joyce Gross Ciel supplement and for now keep him n.p.o.  We will give him Solu-Medrol 40 mg IV q.12h. or Decadron in an equivalent dose.  We will start him on Protonix 40 mg IV q.d. and Rowasa enema at bedtime.  We will also use Levsin SL t.i.d. to help with his cramps.  We will hold off his Tricor and Lipitor for now and also hold off antibiotics. Dictated by:   Lionel December, M.D. Attending Physician:  Malissa Hippo DD:  05/06/01 TD:  05/07/01 Job: [id number]

## 2010-07-07 NOTE — H&P (Signed)
Calvin Cruz, Calvin Cruz               ACCOUNT NO.:  [id number]   MEDICAL RECORD NO.:  [id number]          PATIENT TYPE:  INP   LOCATION:  A325                          FACILITY:  [facility]   PHYSICIAN:  Gardiner Barefoot, MD    DATE OF BIRTH:  [birth year]   DATE OF ADMISSION:  04/13/2006  DATE OF DISCHARGE:  LH                              HISTORY & PHYSICAL   CHIEF COMPLAINT:  Nausea, vomiting and diarrhea.   HISTORY OF PRESENT ILLNESS:  Calvin Cruz is a pleasant 50 year old male  with a history of ulcerative colitis and hemicolectomy who presents with  3-day history of nausea, vomiting and diarrhea.  The patient has had a  subjective fever at home as well and had to call his primary physician  who prescribed Phenergan with no relief.  The patient says it is similar  to what he has had in the past.  He reports no sick contacts, no travel,  no recent illnesses and no other complaints.  The patient is unable to  hold any food down at this time.   PAST MEDICAL HISTORY:  1. GERD.  2. Hyperlipidemia.  3. Ulcerative colitis.  4. Hemicolectomy.   MEDICATIONS:  1. Prilosec.  2. Tricor.   ALLERGIES:  1. DOXYCYCLINE causes rash.  2. MORPHINE causes rash.   FAMILY HISTORY:  No history of ulcerative colitis.   SOCIAL HISTORY:  The patient lives in [place] with his wife.  Denies  any alcohol, tobacco or drugs.   REVIEW OF SYSTEMS:  A 12-point review of systems is obtained and was  negative other than that as presented in the history of present illness.   PHYSICAL EXAMINATION:  VITAL SIGNS:  Temperature 98.6, pulse is 104,  respirations 20, blood pressure is 116/84.  Weight is 242 pounds.  GENERAL:  The patient is awake, alert and oriented x3 and appears in  mild stress with nausea.  HEENT:  Anicteric.  Mucus membranes are dry.  CARDIOVASCULAR:  Tachycardic with regular rhythm and no murmurs,  gallops, or rubs.  LUNGS:  Clear to auscultation bilaterally.  ABDOMEN:  Soft, nontender.  Mild  distention.  No hepatosplenomegaly and  positive hyperactive bowel sounds.  EXTREMITIES:  No clubbing, cyanosis, or edema.  SKIN:  No rash.   LABORATORY DATA:  Includes WBC of 7.8 with 49% neutrophils, hemoglobin  is 18.3, platelets 332.  CMP notable for a sodium of 135, potassium 3.6,  chloride 98, bicarb 25, glucose 181, BUN is 23, creatinine is 1.24.  Urinalysis is negative.   ASSESSMENT/PLAN:  1. Gastroenteritis:  The patient will be treated with adequate      hydration and we will check the stool for bacterial causes as well      as treat the patient for his nausea and vomiting with p.r.n. Zofran      and Dilaudid for pain.  We will consult his gastroenterologist, Dr.      [name].  2. Gastroesophageal reflux disease:  We will continue with intravenous      reflux medicine.  3. Disposition:  The patient is a full code.  Gardiner Barefoot, MD  Electronically Signed     RWC/MEDQ  D:  04/13/2006  T:  04/13/2006  Job:  [id number]

## 2010-10-31 ENCOUNTER — Emergency Department (HOSPITAL_COMMUNITY): Payer: Worker's Compensation

## 2010-10-31 ENCOUNTER — Encounter: Payer: Self-pay | Admitting: Emergency Medicine

## 2010-10-31 ENCOUNTER — Emergency Department (HOSPITAL_COMMUNITY)
Admission: EM | Admit: 2010-10-31 | Discharge: 2010-10-31 | Disposition: A | Discharge status: Home / Self Care | Payer: Worker's Compensation | Attending: Emergency Medicine | Admitting: Emergency Medicine

## 2010-10-31 DIAGNOSIS — W230XXA Caught, crushed, jammed, or pinched between moving objects, initial encounter: Secondary | ICD-10-CM | POA: Insufficient documentation

## 2010-10-31 DIAGNOSIS — Y9269 Other specified industrial and construction area as the place of occurrence of the external cause: Secondary | ICD-10-CM | POA: Insufficient documentation

## 2010-10-31 DIAGNOSIS — S62609B Fracture of unspecified phalanx of unspecified finger, initial encounter for open fracture: Secondary | ICD-10-CM | POA: Insufficient documentation

## 2010-10-31 MED ORDER — HYDROCODONE-ACETAMINOPHEN 5-325 MG PO TABS
1.0000 | ORAL_TABLET | Freq: Once | ORAL | Status: DC
  Filled 2010-10-31: qty 1

## 2010-10-31 MED ORDER — LIDOCAINE HCL (PF) 2 % IJ SOLN
5.0000 mL | Freq: Once | INTRAMUSCULAR | Status: AC
  Administered 2010-10-31: 5 mL
  Filled 2010-10-31: qty 10

## 2010-10-31 MED ORDER — CEPHALEXIN 500 MG PO CAPS: 500.0000 mg | ORAL_CAPSULE | Freq: Four times a day (QID) | ORAL | Status: AC

## 2010-10-31 MED ORDER — HYDROCODONE-ACETAMINOPHEN 5-325 MG PO TABS: 1.0000 | ORAL_TABLET | ORAL | Status: AC | PRN

## 2010-10-31 MED ORDER — CEPHALEXIN 500 MG PO CAPS
500.0000 mg | ORAL_CAPSULE | Freq: Once | ORAL | Status: AC
  Administered 2010-10-31: 500 mg via ORAL
  Filled 2010-10-31: qty 1

## 2010-10-31 NOTE — ED Notes (Signed)
Pt a/ox4. Resp even and unlabored. NAD at this time. D/C instructions reviewed with pt. Pt verbalized understanding. Vicodin 6 pack dispensed by Burgess Amor. Pt escorted to lab for drug screen.

## 2010-10-31 NOTE — ED Provider Notes (Signed)
Medical screening examination/treatment/procedure(s) were performed by non-physician practitioner and as supervising physician I was immediately available for consultation/collaboration. Devoria Albe, MD, Armando Gang   Ward Givens, MD 10/31/10 [phone]

## 2010-10-31 NOTE — ED Provider Notes (Signed)
History     CSN: [id number] Arrival date & time: 10/31/2010  5:50 PM  Chief Complaint  Patient presents with  . Laceration   Patient is a 50 y.o. male presenting with skin laceration. The history is provided by the patient.  Laceration  The incident occurred less than 1 hour ago. The laceration is located on the right hand. The laceration is 1 cm in size. Injury mechanism: caught finger at work in a door. The pain is at a severity of 8/10. The pain is moderate (He denies numbness of the finger. ). The pain has been constant since onset. He reports no foreign bodies present. His tetanus status is unknown.    Past Medical History  Diagnosis Date  . Ulcerative colitis   . High cholesterol     Past Surgical History  Procedure Date  . Colectomy   . Hernia repair     Family History  Problem Relation Age of Onset  . Diabetes Mother   . Coronary artery disease Mother   . Cancer Father   . Diabetes Brother     History  Substance Use Topics  . Smoking status: Never Smoker   . Smokeless tobacco: Not on file  . Alcohol Use: No      Review of Systems  All other systems reviewed and are negative.    Physical Exam  BP 133/84  Pulse 84  Temp 99.2 F (37.3 C)  Resp 20  Ht 6\' 6"  (1.981 m)  Wt 240 lb (108.863 kg)  BMI 27.73 kg/m2  SpO2 100%  Physical Exam  Nursing note and vitals reviewed. Constitutional: He is oriented to person, place, and time. He appears well-developed and well-nourished.  HENT:  Head: Normocephalic and atraumatic.  Eyes: Conjunctivae are normal.  Neck: Normal range of motion.  Cardiovascular: Normal rate and intact distal pulses.   Pulmonary/Chest: Effort normal.  Abdominal: He exhibits no distension.  Musculoskeletal: Normal range of motion. He exhibits tenderness.  Neurological: He is alert and oriented to person, place, and time.       Distal sensation intact.  Skin: Skin is warm and dry.       Laceration through distal right index finger,   Including through distal nail plate.  Hemostatic.  Psychiatric: He has a normal mood and affect.    ED Course  Procedures  MDM Spoke with Dr Amanda Pea who will see patient in am at his office 11 am.  Xeroform,  Bulky dressing,  Keflex.  No sutures.      Candis Musa, PA 10/31/10 2141

## 2010-10-31 NOTE — ED Notes (Signed)
Pt has laceration to index finger after getting it caught in a machine at work.

## 2010-10-31 NOTE — ED Notes (Signed)
Betadine and NS soak being performed.

## 2012-02-20 DIAGNOSIS — M109 Gout, unspecified: Secondary | ICD-10-CM

## 2012-02-20 HISTORY — DX: Gout, unspecified: M10.9

## 2012-10-30 ENCOUNTER — Telehealth: Payer: Self-pay | Admitting: Hematology and Oncology

## 2012-10-30 NOTE — Telephone Encounter (Signed)
Left pt vm to return call in ref to np appt. °

## 2012-11-05 ENCOUNTER — Telehealth: Payer: Self-pay | Admitting: Hematology and Oncology

## 2012-11-05 NOTE — Telephone Encounter (Signed)
S/w pt wife and gve np appt 10/10 @ 10:30 w/Dr. Bertis Ruddy Referring Dr. Adrian Prince  Dx- Polycythemia Welcome packet mailed.

## 2012-11-05 NOTE — Telephone Encounter (Signed)
C/D 11/05/12 for appt. 10/10

## 2012-11-05 NOTE — Telephone Encounter (Signed)
2ND. LVOM FOR PT TO RETURN CALL IN RE TO REFERRAL.  °

## 2012-11-28 ENCOUNTER — Ambulatory Visit (HOSPITAL_BASED_OUTPATIENT_CLINIC_OR_DEPARTMENT_OTHER): Payer: Worker's Compensation

## 2012-11-28 ENCOUNTER — Encounter: Payer: Self-pay | Admitting: Hematology and Oncology

## 2012-11-28 ENCOUNTER — Telehealth: Payer: Self-pay | Admitting: Hematology and Oncology

## 2012-11-28 ENCOUNTER — Ambulatory Visit (HOSPITAL_BASED_OUTPATIENT_CLINIC_OR_DEPARTMENT_OTHER): Payer: Worker's Compensation | Admitting: Hematology and Oncology

## 2012-11-28 ENCOUNTER — Ambulatory Visit (HOSPITAL_BASED_OUTPATIENT_CLINIC_OR_DEPARTMENT_OTHER): Payer: Managed Care, Other (non HMO)

## 2012-11-28 ENCOUNTER — Other Ambulatory Visit (HOSPITAL_BASED_OUTPATIENT_CLINIC_OR_DEPARTMENT_OTHER): Payer: Worker's Compensation | Admitting: Lab

## 2012-11-28 VITALS — BP 135/85 | HR 72 | Temp 97.5°F | Resp 20 | Ht 78.0 in | Wt 242.9 lb

## 2012-11-28 VITALS — BP 129/71 | HR 62

## 2012-11-28 DIAGNOSIS — E291 Testicular hypofunction: Secondary | ICD-10-CM

## 2012-11-28 DIAGNOSIS — D751 Secondary polycythemia: Secondary | ICD-10-CM

## 2012-11-28 DIAGNOSIS — Z9049 Acquired absence of other specified parts of digestive tract: Secondary | ICD-10-CM

## 2012-11-28 DIAGNOSIS — K5190 Ulcerative colitis, unspecified, without complications: Secondary | ICD-10-CM

## 2012-11-28 DIAGNOSIS — D45 Polycythemia vera: Secondary | ICD-10-CM

## 2012-11-28 DIAGNOSIS — R5383 Other fatigue: Secondary | ICD-10-CM

## 2012-11-28 DIAGNOSIS — R5381 Other malaise: Secondary | ICD-10-CM

## 2012-11-28 HISTORY — DX: Secondary polycythemia: D75.1

## 2012-11-28 HISTORY — DX: Other fatigue: R53.83

## 2012-11-28 LAB — CBC WITH DIFFERENTIAL/PLATELET
Basophils Absolute: 0 10*3/uL (ref 0.0–0.1)
EOS%: 1.3 % (ref 0.0–7.0)
Eosinophils Absolute: 0.1 10*3/uL (ref 0.0–0.5)
HGB: 14.3 g/dL (ref 13.0–17.1)
LYMPH%: 49.9 % — ABNORMAL HIGH (ref 14.0–49.0)
MCV: 89.6 fL (ref 79.3–98.0)
MONO%: 7.4 % (ref 0.0–14.0)
NEUT#: 2.1 10*3/uL (ref 1.5–6.5)
Platelets: 152 10*3/uL (ref 140–400)
RBC: 4.59 10*6/uL (ref 4.20–5.82)
RDW: 12.4 % (ref 11.0–14.6)

## 2012-11-28 LAB — COMPREHENSIVE METABOLIC PANEL (CC13)
ALT: 25 U/L (ref 0–55)
AST: 22 U/L (ref 5–34)
Albumin: 3.2 g/dL — ABNORMAL LOW (ref 3.5–5.0)
Alkaline Phosphatase: 57 U/L (ref 40–150)
BUN: 9.5 mg/dL (ref 7.0–26.0)
Creatinine: 0.7 mg/dL (ref 0.7–1.3)
Potassium: 3.9 mEq/L (ref 3.5–5.1)

## 2012-11-28 MED ORDER — SODIUM CHLORIDE 0.9 % IV SOLN
1000.0000 mL | INTRAVENOUS | Status: DC
  Administered 2012-11-28: 12:00:00 via INTRAVENOUS

## 2012-11-28 NOTE — Patient Instructions (Signed)

## 2012-11-28 NOTE — Progress Notes (Signed)
1203-Phlebotomy to right ac.  375 grams drained from 1203-1210.  At 1210-Pt started to c/o being "light headed".  Phlebotomy stopped immediately.  Pt.'s feet elevated. Pt unresponsive for approximately 1 minute. IV started to left forearm with NS wide open.  Dr. Bertis Ruddy notified and to infusion room to assess patient.  VS monitored closely.  Pt.'s wife at bedside.  Pt discharged to home after receiving 1 liter of NS and had no complaints at time of discharge.  Gait steady.  Pt has no questions at this time.  Pressure dressing clean, dry and intact to phlebotomy site on left arm.  Phlebotomy discharge instructions reviewed with patient.

## 2012-11-28 NOTE — Progress Notes (Signed)
Checked in new patient with no financial issues. Mail and phone only °

## 2012-11-28 NOTE — Progress Notes (Signed)
[facility] CONSULT NOTE  CHIEF COMPLAINTS/PURPOSE OF CONSULTATION: Secondary erythrocytosis  HISTORY OF PRESENTING ILLNESS:  Calvin Cruz 52 y.o. male is accompanied by his wife today. The patient reportedly had a normal CBC at baseline although we do not have records that. More than a year ago, he was complaining of profound fatigue and subsequently went to her primary care provider who subsequently found that his testosterone level was borderline low. He was started on testosterone injection every other week for many months but he was not improving his testosterone level or his energy level. He saw another provider who prescribed androgen gel and has been taking that for almost a year on a daily basis. Despite seeing elevated levels of testosterone, the patient still feels fatigued. Incidentally, he have blood work checked and found to have severe erythrocytosis. He was referred to do blood donation Center and donated his blood once. Most recently, recheck testosterone level was over 19 g and he was refused blood donation and hence was being referred here. The patient denies recent diagnosis of blood clots. Denies any headaches, chest pain, shortness of breath, older cramps. The patient has chronic musculoskeletal pain and recently was found to a borderline low vitamin D level and has started on vitamin D supplements this week. He also has a history of ulcerative colitis status post complete colectomy with ileal anal anastomosis in 2003. The patient denies smoking history. He is never been diagnosed with obstructive sleep apnea although according to his wife, he fractured his nasal bones 3 times and appeared to snore heavily at times. Denies excessive exposure to carbon monoxide. No family history of polycythemia vera or erythrocytosis  MEDICAL HISTORY:  Past Medical History  Diagnosis Date  . Ulcerative colitis   . High cholesterol   . Gout 2014  . Fatigue 11/28/2012  .  Erythrocytosis 11/28/2012    SURGICAL HISTORY: Past Surgical History  Procedure Laterality Date  . Colectomy    . Hernia repair      SOCIAL HISTORY: History   Social History  . Marital Status: Married    Spouse Name: N/A    Number of Children: N/A  . Years of Education: N/A   Occupational History  . Not on file.   Social History Main Topics  . Smoking status: Never Smoker   . Smokeless tobacco: Not on file  . Alcohol Use: No  . Drug Use: No  . Sexual Activity: Not on file   Other Topics Concern  . Not on file   Social History Narrative  . No narrative on file    FAMILY HISTORY: Family History  Problem Relation Age of Onset  . Diabetes Mother   . Coronary artery disease Mother   . Cancer Father 30    lung  . Diabetes Brother     ALLERGIES:  is allergic to doxycycline; flagyl; and morphine and related.  MEDICATIONS: Current outpatient prescriptions:allopurinol (ZYLOPRIM) 300 MG tablet, Take 300 mg by mouth daily., Disp: , Rfl: ;  diphenoxylate-atropine (LOMOTIL) 2.5-0.025 MG per tablet, Take 2 tablets by mouth 2 (two) times daily. Twice daily and as needed for diarrhea, Disp: , Rfl: ;  omega-3 acid ethyl esters (LOVAZA) 1 G capsule, Take 2 g by mouth 2 (two) times daily.  , Disp: , Rfl:  OVER THE COUNTER MEDICATION, Take by mouth daily. Multivitamin Liquid: Mixture of vitamins and minerals , Disp: , Rfl:   REVIEW OF SYSTEMS:   Constitutional: Denies fevers, chills or abnormal weight  loss Eyes: Denies blurriness of vision, double vision or watery eyes Ears, nose, mouth, throat, and face: Denies mucositis or sore throat Respiratory: Denies cough, dyspnea or wheezes Cardiovascular: Denies palpitation, chest discomfort or lower extremity swelling Gastrointestinal:  Denies nausea, heartburn or change in bowel habits Skin: Denies abnormal skin rashes Lymphatics: Denies new lymphadenopathy or easy bruising Neurological:Denies numbness, tingling or new  weaknesses Behavioral/Psych: Mood is stable, no new changes  All other systems were reviewed with the patient and are negative.  PHYSICAL EXAMINATION: ECOG PERFORMANCE STATUS: 1 - Symptomatic but completely ambulatory  Filed Vitals:   11/28/12 1043  BP: 135/85  Pulse: 72  Temp: 97.5 F (36.4 C)  Resp: 20   Filed Weights   11/28/12 1043  Weight: 242 lb 14.4 oz (110.179 kg)    GENERAL:alert, no distress and comfortable SKIN: skin color, texture, turgor are normal, no rashes or significant lesions EYES: normal, conjunctiva are pink and non-injected, sclera clear OROPHARYNX:no exudate, no erythema and lips, buccal mucosa, and tongue normal  NECK: supple, thyroid normal size, non-tender, without nodularity LYMPH:  no palpable lymphadenopathy in the cervical, axillary or inguinal LUNGS: clear to auscultation and percussion with normal breathing effort HEART: regular rate & rhythm and no murmurs and no lower extremity edema ABDOMEN:abdomen soft, non-tender and normal bowel sounds Musculoskeletal:no cyanosis of digits and no clubbing  PSYCH: alert & oriented x 3 with fluent speech NEURO: no focal motor/sensory deficits  LABORATORY DATA:  I have reviewed the data as listed I reviewed his CBC was done in his physician's office dated 10/13/2012 it came back really high with hemoglobin 19.4 and hematocrit of 56.8%.   ASSESSMENT:  Erythrocytosis, likely secondary to testosterone use  PLAN:  #1 erythrocytosis The patient has discontinued his testosterone use 2-3 weeks ago. The patient does look plethoric today. I recommend phlebotomy of one unit of blood. Also recommend the patient to take aspirin. The patient is not going to go back on hormone replacement therapy due to side effects of erythrocytosis and he did not help with his energy level #2 hypogonadism I suspect this is not the cause of his fatigue. We would discontinue his hormone replacement therapy #3 fatigue We'll check his  thyroid function tests today. Am wondering whether the patient may have diagnosis obstructive sleep apnea. After further investigation, it appears the patient knows to work long hours at work. I recommend lifestyle modifications before we refer him to get a sleep study. #4 history of ulcerative colitis status post colectomy I would check a B12 level today. I recommend the patient to continue high dose vitamin D due to possible malabsorption. Addendum while the patient is getting phlebotomy today he actually passed out for about a minute. The patient's vital signs were stable. We will give you 1 L of saline infusion before discharging him from the clinic.  All questions were answered. The patient knows to call the clinic with any problems, questions or concerns. I can certainly see the patient much sooner if necessary. No barriers to learning was detected.  I spent 40 minutes counseling the patient face to face. The total time spent in the appointment was 60 minutes and more than 50% was on counseling.     Salim Forero, MD 11/28/2012 12:01 PM

## 2012-11-28 NOTE — Telephone Encounter (Signed)
gv and printed appt sched and avs for pt for OCT added tx for today per Cameo

## 2012-12-01 LAB — VITAMIN B12: Vitamin B-12: 926 pg/mL — ABNORMAL HIGH (ref 211–911)

## 2012-12-01 LAB — T4: T4, Total: 8 ug/dL (ref 5.0–12.5)

## 2012-12-01 LAB — ERYTHROPOIETIN: Erythropoietin: 3.3 m[IU]/mL (ref 2.6–18.5)

## 2012-12-05 ENCOUNTER — Encounter (INDEPENDENT_AMBULATORY_CARE_PROVIDER_SITE_OTHER): Payer: Self-pay | Admitting: *Deleted

## 2012-12-11 ENCOUNTER — Other Ambulatory Visit (HOSPITAL_BASED_OUTPATIENT_CLINIC_OR_DEPARTMENT_OTHER): Payer: Worker's Compensation | Admitting: Lab

## 2012-12-11 ENCOUNTER — Ambulatory Visit (HOSPITAL_BASED_OUTPATIENT_CLINIC_OR_DEPARTMENT_OTHER): Payer: Worker's Compensation | Admitting: Hematology and Oncology

## 2012-12-11 VITALS — BP 129/78 | HR 87 | Temp 97.5°F | Resp 20 | Ht 78.0 in | Wt 242.1 lb

## 2012-12-11 DIAGNOSIS — R5381 Other malaise: Secondary | ICD-10-CM

## 2012-12-11 DIAGNOSIS — D751 Secondary polycythemia: Secondary | ICD-10-CM

## 2012-12-11 DIAGNOSIS — Z9049 Acquired absence of other specified parts of digestive tract: Secondary | ICD-10-CM

## 2012-12-11 DIAGNOSIS — R5383 Other fatigue: Secondary | ICD-10-CM

## 2012-12-11 LAB — CBC WITH DIFFERENTIAL/PLATELET
Eosinophils Absolute: 0.1 10*3/uL (ref 0.0–0.5)
HCT: 43.2 % (ref 38.4–49.9)
LYMPH%: 44.7 % (ref 14.0–49.0)
MCHC: 35.2 g/dL (ref 32.0–36.0)
MCV: 89 fL (ref 79.3–98.0)
MONO#: 0.4 10*3/uL (ref 0.1–0.9)
NEUT#: 2.8 10*3/uL (ref 1.5–6.5)
Platelets: 183 10*3/uL (ref 140–400)
RBC: 4.86 10*6/uL (ref 4.20–5.82)
RDW: 12.7 % (ref 11.0–14.6)
WBC: 6 10*3/uL (ref 4.0–10.3)
lymph#: 2.7 10*3/uL (ref 0.9–3.3)

## 2012-12-11 NOTE — Progress Notes (Signed)
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 The patient was found to have hypergonadism and was placed on testosterone replacement therapy. Subsequently he was found to have severe erythrocytosis requiring phlebotomy sessions INTERVAL HISTORY: Calvin Cruz 52 y.o. male returns for further followup. He had fainting episode after the recent phlebotomy. History of fatigue. Denies any headache some muscle cramps.  I have reviewed the past medical history, past surgical history, social history and family history with the patient and they are unchanged from previous note.  ALLERGIES:  is allergic to doxycycline; flagyl; and morphine and related.  MEDICATIONS:  Current Outpatient Prescriptions  Medication Sig Dispense Refill  . allopurinol (ZYLOPRIM) 300 MG tablet Take 300 mg by mouth daily.      . diphenoxylate-atropine (LOMOTIL) 2.5-0.025 MG per tablet Take 2 tablets by mouth 2 (two) times daily. Twice daily and as needed for diarrhea      . omega-3 acid ethyl esters (LOVAZA) 1 G capsule Take 2 g by mouth 2 (two) times daily.        Marland Kitchen OVER THE COUNTER MEDICATION Take by mouth daily. Multivitamin Liquid: Mixture of vitamins and minerals        No current facility-administered medications for this visit.     REVIEW OF SYSTEMS:   Constitutional: Denies fevers, chills or night sweats All other systems were reviewed with the patient and are negative.  PHYSICAL EXAMINATION: ECOG PERFORMANCE STATUS: 1 - Symptomatic but completely ambulatory  Filed Vitals:   12/11/12 1306  BP: 129/78  Pulse: 87  Temp: 97.5 F (36.4 C)  Resp: 20   Filed Weights   12/11/12 1306  Weight: 242 lb 1.6 oz (109.816 kg)    GENERAL:alert, no distress and comfortable  LABORATORY DATA:  I have reviewed  the data as listed Results for orders placed in visit on 12/11/12 (from the past 48 hour(s))  CBC WITH DIFFERENTIAL     Status: None   Collection Time    12/11/12 12:49 PM      Result Value Range   WBC 6.0  4.0 - 10.3 10e3/uL   NEUT# 2.8  1.5 - 6.5 10e3/uL   HGB 15.2  13.0 - 17.1 g/dL   HCT 40.9  81.1 - 91.4 %   Platelets 183  140 - 400 10e3/uL   MCV 89.0  79.3 - 98.0 fL   MCH 31.3  27.2 - 33.4 pg   MCHC 35.2  32.0 - 36.0 g/dL   RBC 7.82  9.56 - 2.13 10e6/uL   RDW 12.7  11.0 - 14.6 %   lymph# 2.7  0.9 - 3.3 10e3/uL   MONO# 0.4  0.1 - 0.9 10e3/uL   Eosinophils Absolute 0.1  0.0 - 0.5 10e3/uL   Basophils Absolute 0.1  0.0 - 0.1 10e3/uL   NEUT% 46.2  39.0 - 75.0 %   LYMPH% 44.7  14.0 - 49.0 %   MONO% 6.9  0.0 - 14.0 %   EOS% 1.2  0.0 - 7.0 %   BASO% 1.0  0.0 - 2.0 %    ASSESSMENT:  Erythrocytosis secondary to testosterone replacement  PLAN:  #1 secondary erythrocytosis After phlebotomy session, his hemoglobin now dropped to less than 16 g. I recommend he continue to stay off using testosterone replacement therapy. The patient agrees. One further workup that has not been  done is evaluation for a obstructive sleep apnea. The patient will like to hold off for now. I recommend he have his CBC rechecked in 3 months. I recommend the patient to consider 81 mg aspirin to prevent risk of blood clot #2 fatigue Suspected to stress from work. He could also have an undiagnosed obstructive sleep apnea. The patient would like to defer workup for now. All questions were answered. The patient knows to call the clinic with any problems, questions or concerns. No barriers to learning was detected.  I spent 10 minutes counseling the patient face to face. The total time spent in the appointment was 15 minutes and more than 50% was on counseling.     [facility], Jamarea Selner, MD 12/11/2012 1:43 PM

## 2012-12-22 ENCOUNTER — Ambulatory Visit (INDEPENDENT_AMBULATORY_CARE_PROVIDER_SITE_OTHER): Payer: Managed Care, Other (non HMO) | Admitting: Internal Medicine

## 2012-12-22 ENCOUNTER — Encounter (INDEPENDENT_AMBULATORY_CARE_PROVIDER_SITE_OTHER): Payer: Self-pay | Admitting: Internal Medicine

## 2012-12-22 VITALS — BP 122/74 | HR 86 | Temp 97.3°F | Resp 18 | Ht 78.0 in | Wt 241.6 lb

## 2012-12-22 DIAGNOSIS — K439 Ventral hernia without obstruction or gangrene: Secondary | ICD-10-CM

## 2012-12-22 DIAGNOSIS — K91850 Pouchitis: Secondary | ICD-10-CM | POA: Insufficient documentation

## 2012-12-22 DIAGNOSIS — E291 Testicular hypofunction: Secondary | ICD-10-CM

## 2012-12-22 MED ORDER — RIFAXIMIN 550 MG PO TABS: 550.0000 mg | ORAL_TABLET | Freq: Two times a day (BID) | ORAL | Status: DC

## 2012-12-22 NOTE — Patient Instructions (Addendum)
Use Xifaxan as directed for total of 2 weeks each time. Call if abdominal pain recurs.

## 2012-12-22 NOTE — Progress Notes (Signed)
Presenting complaint;  Upper abdominal pain. To discuss treatment options for pouchitis.  History of present illness;   patient is 52 year old Caucasian male who is here for scheduled visit accompanied by his wife. He was last seen over 2 years ago. He has history of ulcerative colitis requiring proctocolectomy with ileal pouch anal anastomosis in 2003. Few weeks ago he developed aching pain across his upper abdomen. He felt like he had sore muscles. He did not experience nausea or vomiting fever or chills melena or rectal bleeding. This pain has completely resolved. He also complains of intermittent soreness involving a ventral hernia. He states he is always able to reduce this hernia. He does not believe his getting bigger. He has history of pouchitis and took antibiotics about 6 months ago. He had good response to Flagyl but he developed skin rash after 3 courses or so. Similarly he developed side effects with Cipro. His wife is wondering what he could take for the next episode of pouchitis. On an average she has 5 stools per day. Stool consistency varies from watery to semi-formed. He has occasional nocturnal bowel movement. He has not seen Dr. Durenda Hurt in the last few years. It has been more than 5 years since his pouch was examined. He has very good appetite and his weight has been stable. He does not do scheduled exercise but he states he gets to do a lot of walking and climbing stairs at work. He works anywhere from 3-6 days each week.  Current Medications: Lomotil 2 tablets by mouth twice a day when necessary. Lovaza 2 g by mouth twice a day. MVI one capsule by mouth twice a day. AndroGel 1.62%; 4 pumps to skin daily.  Past medical history; History of ulcerative colitis refractory to medical therapy leading to proctocolectomy with ileal pouch anal anastomosis. First stage was in November 2003 and  second stage was in February 2004. He had cholecystectomy at the time of takedown of  ileostomy. History of pre-diabetes. History of urolithiasis Hypogonadism. Erythrocytosis secondary to androgen therapy. H&H is now normal. Umbilicus herniorrhaphy. Keratoplasty in October 2013. He was diagnosed with gout 3 months ago.  Allergies; Allergies  Allergen Reactions  . Ciprofloxacin Itching  . Doxycycline Other (See Comments)    Redness of skin, burning sensation  . Flagyl [Metronidazole] Itching  . Morphine And Related Itching    Family history; Father had CAD and lung cancer and died at 51. Mother had CAD diabetes and hypertension and died at 26. He has 2 brothers and one sister. Both brothers are diabetic and sister has hyperlipidemia.  Social history; He is married and has one son. They have 2 adopted grandchildren. He smoked cigarettes for 25 years about a pack a day but quit in March 2003. He does not drink alcohol. He worked at [employer] for 25 years and for the last 8 years he's been working at [employer] and [employer].   Objective: Blood pressure 122/74, pulse 86, temperature 97.3 F (36.3 C), temperature source Oral, resp. rate 18, height 6\' 6"  (1.981 m), weight 241 lb 9.6 oz (109.589 kg). Patient is alert and in no acute distress. Conjunctiva is pink. Sclera is nonicteric Oropharyngeal mucosa is normal. No neck masses or thyromegaly noted. Cardiac exam with regular rhythm normal S1 and S2. No murmur or gallop noted. Lungs are clear to auscultation. Abdomen is symmetrical with midline scar as well as ileostomy scar in right lower quadrant. Small hernia noted to the left of scar above level  of umbilicus. It is completely reducible and nontender. No organomegaly or masses noted.  No LE edema or clubbing noted.  Labs/studies Results: Lab data from 10/13/2012. Hemoglobin A1c 6.7%  Bilirubin 1.4 and direct 0.4, AP 66, AST 28, ALT to 33, total protein 7.0 and albumin 4.0 CBC from 12/11/2012. WBC 6.0, H&H 15.2 and 43.2 and platelet count  183K  Assessment:  #1. recent episode of self-limiting upper abdominal pain possibly musculoskeletal. Will not proceed with further workup unless pain relapses in which case he would be evaluated while symptomatic. #2. Small ventral hernia. He is having intermittent discomfort but hernia is completely reducible. Will monitor course and determine if he should have it fixed. #3. History of ulcerative colitis. Status post proctocolectomy with ileal pouch anal anastomosis over 10 years ago. He has had 2 episodes of pouchitis a long delay and has responded to metronidazole and Cipro and unfortunately he cannot take either of these medications. Next choice would be Xifaxan. He may also need examination of pouch at some point.    Plan:  Patient given prescription for Xifaxan 550 mg along with instructions on how to use this medication. If he does experience another episode of pouchitis he'll take it for 2 weeks and stop. Patient will call for urgent appointment if abdominal pain recurs or he develops symptoms pertaining to ventral hernia otherwise he will return for office visit in one year. I will confer with his surgeon Dr. Durenda Hurt of [facility] if there is any need for examination of ileal pouch.

## 2013-02-04 ENCOUNTER — Encounter (INDEPENDENT_AMBULATORY_CARE_PROVIDER_SITE_OTHER): Payer: Self-pay

## 2013-08-26 ENCOUNTER — Encounter (INDEPENDENT_AMBULATORY_CARE_PROVIDER_SITE_OTHER): Payer: Self-pay | Admitting: *Deleted

## 2013-12-22 ENCOUNTER — Encounter (INDEPENDENT_AMBULATORY_CARE_PROVIDER_SITE_OTHER): Payer: Managed Care, Other (non HMO) | Admitting: Internal Medicine

## 2013-12-22 NOTE — Progress Notes (Deleted)
   Subjective:    Patient ID: Calvin Cruz, male    DOB: 05-Aug-1960, 53 y.o.   MRN: [id number]  HPI   History of ulcerative colitis refractory to medical therapy leading to proctocolectomy with ileal pouch anal anastomosis. First stage was in November 2003 and  second stage was in February 2004. He had cholecystectomy at the time of takedown of ileostomy.  .  Review of Systems     Objective:   Physical Exam        Assessment & Plan:

## 2013-12-23 ENCOUNTER — Telehealth (INDEPENDENT_AMBULATORY_CARE_PROVIDER_SITE_OTHER): Payer: Self-pay | Admitting: Internal Medicine

## 2013-12-23 ENCOUNTER — Other Ambulatory Visit (INDEPENDENT_AMBULATORY_CARE_PROVIDER_SITE_OTHER): Payer: Self-pay | Admitting: Internal Medicine

## 2013-12-23 NOTE — Progress Notes (Signed)
This encounter was created in error - please disregard.

## 2013-12-29 ENCOUNTER — Telehealth (INDEPENDENT_AMBULATORY_CARE_PROVIDER_SITE_OTHER): Payer: Self-pay | Admitting: *Deleted

## 2013-12-29 ENCOUNTER — Encounter (INDEPENDENT_AMBULATORY_CARE_PROVIDER_SITE_OTHER): Payer: Self-pay | Admitting: *Deleted

## 2013-12-29 NOTE — Telephone Encounter (Signed)
Albesa Seen SHOWED for his apt with Deberah Castle, NP on 12/22/13. A NS letter has been mailed.

## 2013-12-29 NOTE — Telephone Encounter (Signed)
error 

## 2014-07-22 ENCOUNTER — Encounter (HOSPITAL_COMMUNITY): Payer: Self-pay | Admitting: Emergency Medicine

## 2014-07-22 ENCOUNTER — Ambulatory Visit (HOSPITAL_COMMUNITY): Payer: Managed Care, Other (non HMO)

## 2014-07-22 ENCOUNTER — Inpatient Hospital Stay (HOSPITAL_COMMUNITY)
Admission: EM | Admit: 2014-07-22 | Discharge: 2014-07-24 | DRG: 247 | Disposition: A | Discharge status: Home / Self Care | Payer: Managed Care, Other (non HMO) | Attending: Cardiology | Admitting: Cardiology

## 2014-07-22 ENCOUNTER — Encounter (HOSPITAL_COMMUNITY)
Admission: EM | Disposition: A | Discharge status: Home / Self Care | Payer: Self-pay | Source: Home / Self Care | Attending: Cardiology

## 2014-07-22 ENCOUNTER — Emergency Department (HOSPITAL_COMMUNITY): Payer: Managed Care, Other (non HMO)

## 2014-07-22 DIAGNOSIS — D751 Secondary polycythemia: Secondary | ICD-10-CM | POA: Diagnosis present

## 2014-07-22 DIAGNOSIS — M109 Gout, unspecified: Secondary | ICD-10-CM | POA: Diagnosis present

## 2014-07-22 DIAGNOSIS — Z881 Allergy status to other antibiotic agents status: Secondary | ICD-10-CM | POA: Diagnosis not present

## 2014-07-22 DIAGNOSIS — E785 Hyperlipidemia, unspecified: Secondary | ICD-10-CM | POA: Diagnosis present

## 2014-07-22 DIAGNOSIS — I10 Essential (primary) hypertension: Secondary | ICD-10-CM | POA: Insufficient documentation

## 2014-07-22 DIAGNOSIS — Z888 Allergy status to other drugs, medicaments and biological substances status: Secondary | ICD-10-CM | POA: Diagnosis not present

## 2014-07-22 DIAGNOSIS — Z885 Allergy status to narcotic agent status: Secondary | ICD-10-CM | POA: Diagnosis not present

## 2014-07-22 DIAGNOSIS — I214 Non-ST elevation (NSTEMI) myocardial infarction: Principal | ICD-10-CM | POA: Diagnosis present

## 2014-07-22 DIAGNOSIS — Z8249 Family history of ischemic heart disease and other diseases of the circulatory system: Secondary | ICD-10-CM

## 2014-07-22 DIAGNOSIS — Z833 Family history of diabetes mellitus: Secondary | ICD-10-CM

## 2014-07-22 DIAGNOSIS — Z79899 Other long term (current) drug therapy: Secondary | ICD-10-CM | POA: Diagnosis not present

## 2014-07-22 DIAGNOSIS — I2510 Atherosclerotic heart disease of native coronary artery without angina pectoris: Secondary | ICD-10-CM | POA: Diagnosis not present

## 2014-07-22 DIAGNOSIS — R079 Chest pain, unspecified: Secondary | ICD-10-CM | POA: Diagnosis present

## 2014-07-22 DIAGNOSIS — G43909 Migraine, unspecified, not intractable, without status migrainosus: Secondary | ICD-10-CM | POA: Diagnosis not present

## 2014-07-22 DIAGNOSIS — Z87891 Personal history of nicotine dependence: Secondary | ICD-10-CM

## 2014-07-22 DIAGNOSIS — Z7902 Long term (current) use of antithrombotics/antiplatelets: Secondary | ICD-10-CM

## 2014-07-22 DIAGNOSIS — E780 Pure hypercholesterolemia: Secondary | ICD-10-CM | POA: Diagnosis present

## 2014-07-22 DIAGNOSIS — Z7982 Long term (current) use of aspirin: Secondary | ICD-10-CM | POA: Diagnosis not present

## 2014-07-22 DIAGNOSIS — K5190 Ulcerative colitis, unspecified, without complications: Secondary | ICD-10-CM | POA: Diagnosis present

## 2014-07-22 DIAGNOSIS — Z9861 Coronary angioplasty status: Secondary | ICD-10-CM

## 2014-07-22 HISTORY — DX: Atherosclerotic heart disease of native coronary artery without angina pectoris: I25.10

## 2014-07-22 HISTORY — PX: TRANSTHORACIC ECHOCARDIOGRAM: SHX275

## 2014-07-22 HISTORY — DX: Non-ST elevation (NSTEMI) myocardial infarction: I21.4

## 2014-07-22 LAB — CBC
HCT: 42 % (ref 39.0–52.0)
HEMATOCRIT: 40 % (ref 39.0–52.0)
HEMOGLOBIN: 14.3 g/dL (ref 13.0–17.0)
Hemoglobin: 14.9 g/dL (ref 13.0–17.0)
MCH: 31.5 pg (ref 26.0–34.0)
MCH: 31.1 pg (ref 26.0–34.0)
MCHC: 35.5 g/dL (ref 30.0–36.0)
MCHC: 35.8 g/dL (ref 30.0–36.0)
MCV: 88.8 fL (ref 78.0–100.0)
MCV: 87 fL (ref 78.0–100.0)
PLATELETS: 187 10*3/uL (ref 150–400)
Platelets: 192 10*3/uL (ref 150–400)
RBC: 4.73 MIL/uL (ref 4.22–5.81)
RBC: 4.6 MIL/uL (ref 4.22–5.81)
RDW: 12.3 % (ref 11.5–15.5)
RDW: 12.3 % (ref 11.5–15.5)
WBC: 7.1 10*3/uL (ref 4.0–10.5)
WBC: 6.8 10*3/uL (ref 4.0–10.5)

## 2014-07-22 LAB — BASIC METABOLIC PANEL
Anion gap: 15 (ref 5–15)
Anion gap: 14 (ref 5–15)
BUN: 15 mg/dL (ref 6–20)
BUN: 14 mg/dL (ref 6–20)
CALCIUM: 9.9 mg/dL (ref 8.9–10.3)
CHLORIDE: 101 mmol/L (ref 101–111)
CHLORIDE: 100 mmol/L — AB (ref 101–111)
CO2: 24 mmol/L (ref 22–32)
CO2: 23 mmol/L (ref 22–32)
Calcium: 9.6 mg/dL (ref 8.9–10.3)
Creatinine, Ser: 0.85 mg/dL (ref 0.61–1.24)
Creatinine, Ser: 0.82 mg/dL (ref 0.61–1.24)
GFR calc Af Amer: >60 mL/min (ref >60)
GFR calc non Af Amer: >60 mL/min (ref >60)
GFR calc non Af Amer: >60 mL/min (ref >60)
GLUCOSE: 186 mg/dL — AB (ref 65–99)
Glucose, Bld: 169 mg/dL — ABNORMAL HIGH (ref 65–99)
Potassium: 3.9 mmol/L (ref 3.5–5.1)
Potassium: 3.9 mmol/L (ref 3.5–5.1)
SODIUM: 140 mmol/L (ref 135–145)
Sodium: 137 mmol/L (ref 135–145)

## 2014-07-22 LAB — LIPID PANEL
CHOLESTEROL: 198 mg/dL (ref 0–200)
HDL: 29 mg/dL — AB (ref >40)
LDL CALC: UNDETERMINED mg/dL (ref 0–99)
Total CHOL/HDL Ratio: 6.8 RATIO
Triglycerides: 421 mg/dL — ABNORMAL HIGH (ref <150)
VLDL: UNDETERMINED mg/dL (ref 0–40)

## 2014-07-22 LAB — HEPARIN LEVEL (UNFRACTIONATED): Heparin Unfractionated: 0.32 IU/mL (ref 0.30–0.70)

## 2014-07-22 LAB — APTT: aPTT: 31 seconds (ref 24–37)

## 2014-07-22 LAB — PROTIME-INR
INR: 0.84 (ref 0.00–1.49)
Prothrombin Time: 11.8 seconds (ref 11.6–15.2)

## 2014-07-22 LAB — TROPONIN I
TROPONIN I: 0.64 ng/mL — AB (ref <0.031)
TROPONIN I: 0.76 ng/mL — AB (ref <0.031)
Troponin I: 0.22 ng/mL — ABNORMAL HIGH (ref <0.031)
Troponin I: 0.94 ng/mL (ref <0.031)

## 2014-07-22 LAB — BRAIN NATRIURETIC PEPTIDE: B Natriuretic Peptide: 17.1 pg/mL (ref 0.0–100.0)

## 2014-07-22 LAB — MAGNESIUM: Magnesium: 1.9 mg/dL (ref 1.7–2.4)

## 2014-07-22 LAB — TSH: TSH: 2.944 u[IU]/mL (ref 0.350–4.500)

## 2014-07-22 SURGERY — LEFT HEART CATH AND CORONARY ANGIOGRAPHY

## 2014-07-22 MED ORDER — ACETAMINOPHEN 325 MG PO TABS
650.0000 mg | ORAL_TABLET | ORAL | Status: DC | PRN
  Administered 2014-07-22 – 2014-07-24 (×3): 650 mg via ORAL
  Filled 2014-07-22 (×2): qty 2

## 2014-07-22 MED ORDER — NITROGLYCERIN 0.4 MG SL SUBL
0.4000 mg | SUBLINGUAL_TABLET | SUBLINGUAL | Status: DC | PRN
  Administered 2014-07-23: 0.4 mg via SUBLINGUAL
  Filled 2014-07-22: qty 1

## 2014-07-22 MED ORDER — LORAZEPAM 0.5 MG PO TABS
0.5000 mg | ORAL_TABLET | Freq: Once | ORAL | Status: AC
  Administered 2014-07-22: 0.5 mg via ORAL
  Filled 2014-07-22: qty 1

## 2014-07-22 MED ORDER — SODIUM CHLORIDE 0.9 % IJ SOLN: 3.0000 mL | INTRAMUSCULAR | Status: DC | PRN

## 2014-07-22 MED ORDER — ATORVASTATIN CALCIUM 80 MG PO TABS
80.0000 mg | ORAL_TABLET | Freq: Every day | ORAL | Status: DC
  Administered 2014-07-22 – 2014-07-23 (×2): 80 mg via ORAL
  Filled 2014-07-22 (×2): qty 1

## 2014-07-22 MED ORDER — SODIUM CHLORIDE 0.9 % IJ SOLN: 3.0000 mL | Freq: Two times a day (BID) | INTRAMUSCULAR | Status: DC

## 2014-07-22 MED ORDER — ASPIRIN EC 81 MG PO TBEC
81.0000 mg | DELAYED_RELEASE_TABLET | Freq: Every day | ORAL | Status: DC
Start: 2014-07-24 — End: 2014-07-24

## 2014-07-22 MED ORDER — ASPIRIN EC 81 MG PO TBEC: 81.0000 mg | DELAYED_RELEASE_TABLET | Freq: Every day | ORAL | Status: DC

## 2014-07-22 MED ORDER — NITROGLYCERIN 2 % TD OINT
1.0000 [in_us] | TOPICAL_OINTMENT | Freq: Once | TRANSDERMAL | Status: AC
  Administered 2014-07-22: 1 [in_us] via TOPICAL
  Filled 2014-07-22: qty 1

## 2014-07-22 MED ORDER — ASPIRIN 81 MG PO CHEW
81.0000 mg | CHEWABLE_TABLET | ORAL | Status: AC
  Administered 2014-07-22: 81 mg via ORAL
  Filled 2014-07-22: qty 1

## 2014-07-22 MED ORDER — SODIUM CHLORIDE 0.9 % IV SOLN: 250.0000 mL | INTRAVENOUS | Status: DC | PRN

## 2014-07-22 MED ORDER — METOPROLOL TARTRATE 25 MG PO TABS
25.0000 mg | ORAL_TABLET | Freq: Two times a day (BID) | ORAL | Status: DC
  Administered 2014-07-22 – 2014-07-23 (×4): 25 mg via ORAL
  Filled 2014-07-22 (×5): qty 1

## 2014-07-22 MED ORDER — ONDANSETRON HCL 4 MG/2ML IJ SOLN
4.0000 mg | Freq: Four times a day (QID) | INTRAMUSCULAR | Status: DC | PRN
  Administered 2014-07-24: 4 mg via INTRAVENOUS
  Filled 2014-07-22: qty 2

## 2014-07-22 MED ORDER — ONDANSETRON HCL 4 MG/2ML IJ SOLN
4.0000 mg | Freq: Once | INTRAMUSCULAR | Status: DC
  Filled 2014-07-22: qty 2

## 2014-07-22 MED ORDER — ASPIRIN 81 MG PO CHEW
324.0000 mg | CHEWABLE_TABLET | Freq: Once | ORAL | Status: AC
  Administered 2014-07-22: 324 mg via ORAL
  Filled 2014-07-22: qty 4

## 2014-07-22 MED ORDER — FENOFIBRATE 160 MG PO TABS
160.0000 mg | ORAL_TABLET | Freq: Every day | ORAL | Status: DC
  Administered 2014-07-22: 160 mg via ORAL
  Filled 2014-07-22 (×3): qty 1

## 2014-07-22 MED ORDER — HEPARIN BOLUS VIA INFUSION
4000.0000 [IU] | Freq: Once | INTRAVENOUS | Status: AC
  Administered 2014-07-22: 4000 [IU] via INTRAVENOUS

## 2014-07-22 MED ORDER — HEPARIN (PORCINE) IN NACL 100-0.45 UNIT/ML-% IJ SOLN
1700.0000 [IU]/h | INTRAMUSCULAR | Status: DC
  Administered 2014-07-22 (×2): 1600 [IU]/h via INTRAVENOUS
  Administered 2014-07-23: 1700 [IU]/h via INTRAVENOUS
  Filled 2014-07-22 (×4): qty 250

## 2014-07-22 MED ORDER — SODIUM CHLORIDE 0.9 % IV SOLN
INTRAVENOUS | Status: DC
  Administered 2014-07-22: 950 mL via INTRAVENOUS
  Administered 2014-07-22: 09:00:00 via INTRAVENOUS

## 2014-07-22 MED ORDER — ONDANSETRON HCL 4 MG/2ML IJ SOLN
4.0000 mg | Freq: Once | INTRAMUSCULAR | Status: AC
  Administered 2014-07-22: 4 mg via INTRAVENOUS

## 2014-07-22 MED ORDER — ACETAMINOPHEN 500 MG PO TABS
1000.0000 mg | ORAL_TABLET | Freq: Once | ORAL | Status: AC
  Administered 2014-07-22: 1000 mg via ORAL
  Filled 2014-07-22: qty 2

## 2014-07-22 NOTE — H&P (Signed)
Calvin Cruz is an 54 y.o. male.    Chief Complaint: chest pain Primary Cardiologist: new HPI: Calvin Cruz is a 54 yo man with PMH of ulcerative colitis, secondary erythrocytosis and dyslipidemia who presents with chest pain to [facility]. He characterized his pain as substernal, pressure-like with some right jaw and arm pain/weakness that began 1 night prior to admission and returned night of admission lasting up to 4 hours that resolved by the time he reached the ER. He received aspirin and heparin gtt at [facility] and cardiology consulted for transfer to [facility]. He has not had recurrent pain. The pain finally resolved when he rested. His mother had CAD and 5v CABG. He is currently CP free and accompanied by his wife and son.      Past Medical History  Diagnosis Date  . Ulcerative colitis   . High cholesterol   . Gout 2014  . Fatigue 11/28/2012  . Erythrocytosis 11/28/2012    Past Surgical History  Procedure Laterality Date  . Colectomy    . Hernia repair      Family History  Problem Relation Age of Onset  . Diabetes Mother   . Coronary artery disease Mother   . Cancer Father 71    lung  . Diabetes Brother    Social History:  reports that he quit smoking about 12 years ago. His smoking use included Cigarettes. He has never used smokeless tobacco. He reports that he does not drink alcohol or use illicit drugs.  Allergies:  Allergies  Allergen Reactions  . Ciprofloxacin Itching  . Doxycycline Other (See Comments)    Redness of skin, burning sensation  . Flagyl [Metronidazole] Itching  . Morphine And Related Itching     (Not in a hospital admission)  Results for orders placed or performed during the hospital encounter of 07/22/14 (from the past 48 hour(s))  CBC     Status: None   Collection Time: 07/22/14  1:00 AM  Result Value Ref Range   WBC 7.1 4.0 - 10.5 K/uL   RBC 4.73 4.22 - 5.81 MIL/uL   Hemoglobin 14.9 13.0 - 17.0 g/dL   HCT 42.0 39.0 - 52.0 %     MCV 88.8 78.0 - 100.0 fL   MCH 31.5 26.0 - 34.0 pg   MCHC 35.5 30.0 - 36.0 g/dL   RDW 12.3 11.5 - 15.5 %   Platelets 192 150 - 400 K/uL  Basic metabolic panel     Status: Abnormal   Collection Time: 07/22/14  1:00 AM  Result Value Ref Range   Sodium 140 135 - 145 mmol/L   Potassium 3.9 3.5 - 5.1 mmol/L   Chloride 101 101 - 111 mmol/L   CO2 24 22 - 32 mmol/L   Glucose, Bld 186 (H) 65 - 99 mg/dL   BUN 15 6 - 20 mg/dL   Creatinine, Ser 0.85 0.61 - 1.24 mg/dL   Calcium 9.9 8.9 - 10.3 mg/dL   GFR calc non Af Amer >60 >60 mL/min   GFR calc Af Amer >60 >60 mL/min    Comment: (NOTE) The eGFR has been calculated using the CKD EPI equation. This calculation has not been validated in all clinical situations. eGFR's persistently <60 mL/min signify possible Chronic Kidney Disease.    Anion gap 15 5 - 15  Troponin I     Status: Abnormal   Collection Time: 07/22/14  1:00 AM  Result Value Ref Range   Troponin I 0.22 (H) <0.031  ng/mL    Comment:        PERSISTENTLY INCREASED TROPONIN VALUES IN THE RANGE OF 0.04-0.49 ng/mL CAN BE SEEN IN:       -UNSTABLE ANGINA       -CONGESTIVE HEART FAILURE       -MYOCARDITIS       -CHEST TRAUMA       -ARRYHTHMIAS       -LATE PRESENTING MYOCARDIAL INFARCTION       -COPD   CLINICAL FOLLOW-UP RECOMMENDED.    Dg Chest Port 1 View  07/22/2014   CLINICAL DATA:  Chest pain radiating to the neck, jaw, and bilateral arms. Symptoms for 3 hours.  EXAM: PORTABLE CHEST - 1 VIEW  COMPARISON:  None.  FINDINGS: The cardiomediastinal contours are normal. Pulmonary vasculature is normal. No consolidation, pleural effusion, or pneumothorax. Incidental note of an azygos fissure. No acute osseous abnormalities are seen.  IMPRESSION: No acute pulmonary process.   Electronically Signed   By: Jeb Levering M.D.   On: 07/22/2014 01:31    Review of Systems  Constitutional: Negative for fever, chills and weight loss.  HENT: Negative for ear pain, hearing loss and  tinnitus.   Respiratory: Negative for cough and shortness of breath.   Cardiovascular: Positive for chest pain. Negative for orthopnea, claudication and leg swelling.  Gastrointestinal: Positive for diarrhea. Negative for nausea and vomiting.  Musculoskeletal: Positive for joint pain. Negative for myalgias and neck pain.  Neurological: Positive for headaches. Negative for dizziness, tingling and tremors.  Endo/Heme/Allergies: Negative for polydipsia. Does not bruise/bleed easily.  Psychiatric/Behavioral: Negative for suicidal ideas, hallucinations and substance abuse.    Blood pressure 155/94, pulse 93, temperature 97.4 F (36.3 C), resp. rate 15, height _0  (1.981 m), weight 113.399 kg (250 lb), SpO2 96 %. Physical Exam  Nursing note and vitals reviewed. Constitutional: He is oriented to person, place, and time. He appears well-developed and well-nourished. No distress.  HENT:  Head: Normocephalic and atraumatic.  Nose: Nose normal.  Mouth/Throat: Oropharynx is clear and moist. No oropharyngeal exudate.  Eyes: Conjunctivae and EOM are normal. Pupils are equal, round, and reactive to light. No scleral icterus.  Neck: Normal range of motion. Neck supple. No JVD present. No tracheal deviation present.  Cardiovascular: Normal rate, regular rhythm, normal heart sounds and intact distal pulses.  Exam reveals no gallop.   No murmur heard. Respiratory: Effort normal. No respiratory distress. He has no wheezes. He has no rales.  GI: Soft. Bowel sounds are normal. He exhibits no distension. There is no tenderness. There is no rebound.  Musculoskeletal: Normal range of motion. He exhibits no edema or tenderness.  Neurological: He is alert and oriented to person, place, and time. No cranial nerve deficit. Coordination normal.  Skin: Skin is warm and dry. No rash noted. He is not diaphoretic. No erythema.  Psychiatric: He has a normal mood and affect. His behavior is normal. Thought content normal.     Labs reviewed; na 140, K 3.9, bun/cr 15/0.85, trop 0.22 EKG: SR  Assessment/Plan Calvin Cruz is a 54 yo man with PMH of ulcerative colitis, secondary erythrocytosis and dyslipidemia who presents with chest pain to [facility]. He was found to have elevated troponin consistent with NSTEMI. He has risk factors of ulcerative colitis, dyslipidemia, family history, age and male gender. He received aspirin and heparin gtt at [facility].  Problem List 1. NSTEMI/chest pain 2. Dyslipidemia 3. Hypertension 4. Ulcerative Colitis Plan 1. NPO after MN for  LHC in AM. I discussed this plan with the patient.  2. trend cardiac markers, observation on telemetry, admit to telemetry 3. plan for LHC in AM; if symptoms change, low threshold to activate cath lab 4. asa 81 mg, heparin gtt, metoprolol 25 mg bid  5. atorvastatin 80 mg qHS first dose now 6. hba1c, tsh, lipid panel, BNP 7. Echocardiogram in AM  Alexarae Oliva 07/22/2014, 2:06 AM

## 2014-07-22 NOTE — ED Provider Notes (Signed)
CSN: [id number]     Arrival date & time 07/22/14  0034 History   First MD Initiated Contact with Patient 07/22/14 0110     Chief Complaint  Patient presents with  . Chest Pain     (Consider location/radiation/quality/duration/timing/severity/associated sxs/prior Treatment) HPI  Patient states yesterday afternoon he was just sitting around the house and he started getting right-sided chest pain that went up into his right jaw. He denies any other associated symptoms. He states it lasted about an hour. He states tonight about 9:30 PM after he went to work he started having a sharp right-sided chest pain that radiated into his jaw described as achy feeling. He denies pain in his arms but states his arms felt weak and heavy. He denies nausea or vomiting, diaphoresis, shortness of breath. He states nothing he did made the pain feel better, nothing he did made the pain worse. He states he took some Advil, walked around, and nothing made it feel better. He states the pain continued until he was placed in a stretcher in the ED at which point pain resolved. He states he is currently pain-free. He states he had his blood pressure checked at work and it was high at 188/120. He states he checked it 3 times and the lowest was 177/111. Patient does admit he has "nerves". He states he gets very upset easily if he is having some type of health problem.  Patient states he quit smoking over 10 years ago. He does have high cholesterol and is on medication for that. He states his mother had 5 vessel bypass surgery and died at age 43. He states his father died at age 43 from lung cancer but had some type of heart problem that he is not sure what the details of.     PCP Dr Forde Dandy  Past Medical History  Diagnosis Date  . Ulcerative colitis   . High cholesterol   . Gout 2014  . Fatigue 11/28/2012  . Erythrocytosis 11/28/2012   Past Surgical History  Procedure Laterality Date  . Colectomy    . Hernia repair      Family History  Problem Relation Age of Onset  . Diabetes Mother   . Coronary artery disease Mother   . Cancer Father 31    lung  . Diabetes Brother    History  Substance Use Topics  . Smoking status: Former Smoker    Types: Cigarettes    Quit date: 12/22/2001  . Smokeless tobacco: Never Used  . Alcohol Use: No  lives with spouse employed  Review of Systems  All other systems reviewed and are negative.     Allergies  Ciprofloxacin; Doxycycline; Flagyl; and Morphine and related  Home Medications   Prior to Admission medications   Medication Sig Start Date End Date Taking? Authorizing Provider  diphenoxylate-atropine (LOMOTIL) 2.5-0.025 MG per tablet Take 2 tablets by mouth 2 (two) times daily. Twice daily and as needed for diarrhea   Yes Historical Provider, MD  omega-3 acid ethyl esters (LOVAZA) 1 G capsule Take 2 g by mouth 2 (two) times daily.     Yes Historical Provider, MD  [place] Patient takes 2 by mouth daily - Vita - Sprout Capsules - Multi Vitamin   Yes Historical Provider, MD  rifaximin (XIFAXAN) 550 MG TABS tablet Take 1 tablet (550 mg total) by mouth 2 (two) times daily. 12/22/12   Rogene Houston, MD   BP 155/94 mmHg  Pulse 93  Temp(Src)  97.4 F (36.3 C)  Resp 15  Ht 6\' 6"  (1.981 m)  Wt 250 lb (113.399 kg)  BMI 28.90 kg/m2  SpO2 96%  Vital signs normal except hypertension  Physical Exam  Constitutional: He is oriented to person, place, and time. He appears well-developed and well-nourished.  Non-toxic appearance. He does not appear ill. No distress.  HENT:  Head: Normocephalic and atraumatic.  Right Ear: External ear normal.  Left Ear: External ear normal.  Nose: Nose normal. No mucosal edema or rhinorrhea.  Mouth/Throat: Oropharynx is clear and moist and mucous membranes are normal. No dental abscesses or uvula swelling.  Eyes: Conjunctivae and EOM are normal. Pupils are equal, round, and reactive to light.  Neck: Normal  range of motion and full passive range of motion without pain. Neck supple.  Cardiovascular: Normal rate, regular rhythm and normal heart sounds.  Exam reveals no gallop and no friction rub.   No murmur heard. Pulmonary/Chest: Effort normal and breath sounds normal. No respiratory distress. He has no wheezes. He has no rhonchi. He has no rales. He exhibits no tenderness and no crepitus.  Abdominal: Soft. Normal appearance and bowel sounds are normal. He exhibits no distension. There is no tenderness. There is no rebound and no guarding.  Musculoskeletal: Normal range of motion. He exhibits no edema or tenderness.  Moves all extremities well.   Neurological: He is alert and oriented to person, place, and time. He has normal strength. No cranial nerve deficit.  Skin: Skin is warm, dry and intact. No rash noted. No erythema. No pallor.  Psychiatric: His speech is normal and behavior is normal. His mood appears anxious.  Nursing note and vitals reviewed.   ED Course  Procedures (including critical care time)  Medications  heparin ADULT infusion 100 units/mL (25000 units/250 mL) (1,600 Units/hr Intravenous Transfusing/Transfer 07/22/14 0300)  aspirin chewable tablet 324 mg (324 mg Oral Given 07/22/14 0151)  acetaminophen (TYLENOL) tablet 1,000 mg (1,000 mg Oral Given 07/22/14 0151)  nitroGLYCERIN (NITROGLYN) 2 % ointment 1 inch (1 inch Topical Given 07/22/14 0151)  heparin bolus via infusion 4,000 Units (4,000 Units Intravenous Given 07/22/14 0227)  LORazepam (ATIVAN) tablet 0.5 mg (0.5 mg Oral Given 07/22/14 0246)  ondansetron (ZOFRAN) injection 4 mg (4 mg Intravenous Given 07/22/14 0246)   Patient was given aspirin 324 mg to chew, nitroglycerin ointment was placed and he was given Tylenol as preventative for headache from the nitroglycerin.  Patient's troponin was reviewed and is noted to be positive. He was started on a heparin bolus and drip.  Patient remains pain-free so he was just kept on the  nitroglycerin paste.  02:00 Dr Alejandro Mulling, accepts in transfer to [facility] telemetry, Dr Zandra Abts attending.  Patient informed of need for admission and he is agreeable.  03:15 pt left with [facility]. Remained painfree. Did gets meds for nausea and anxiety.    Labs Review Results for orders placed or performed during the hospital encounter of 07/22/14  CBC  Result Value Ref Range   WBC 7.1 4.0 - 10.5 K/uL   RBC 4.73 4.22 - 5.81 MIL/uL   Hemoglobin 14.9 13.0 - 17.0 g/dL   HCT 42.0 39.0 - 52.0 %   MCV 88.8 78.0 - 100.0 fL   MCH 31.5 26.0 - 34.0 pg   MCHC 35.5 30.0 - 36.0 g/dL   RDW 12.3 11.5 - 15.5 %   Platelets 192 150 - 400 K/uL  Basic metabolic panel  Result Value Ref Range   Sodium  140 135 - 145 mmol/L   Potassium 3.9 3.5 - 5.1 mmol/L   Chloride 101 101 - 111 mmol/L   CO2 24 22 - 32 mmol/L   Glucose, Bld 186 (H) 65 - 99 mg/dL   BUN 15 6 - 20 mg/dL   Creatinine, Ser 0.85 0.61 - 1.24 mg/dL   Calcium 9.9 8.9 - 10.3 mg/dL   GFR calc non Af Amer >60 >60 mL/min   GFR calc Af Amer >60 >60 mL/min   Anion gap 15 5 - 15  Troponin I  Result Value Ref Range   Troponin I 0.22 (H) <0.031 ng/mL  Protime-INR  Result Value Ref Range   Prothrombin Time 11.8 11.6 - 15.2 seconds   INR 0.84 0.00 - 1.49  APTT  Result Value Ref Range   aPTT 31 24 - 37 seconds   Laboratory interpretation all normal except hyperglycemia, positive troponin     Imaging Review Dg Chest Port 1 View  07/22/2014   CLINICAL DATA:  Chest pain radiating to the neck, jaw, and bilateral arms. Symptoms for 3 hours.  EXAM: PORTABLE CHEST - 1 VIEW  COMPARISON:  None.  FINDINGS: The cardiomediastinal contours are normal. Pulmonary vasculature is normal. No consolidation, pleural effusion, or pneumothorax. Incidental note of an azygos fissure. No acute osseous abnormalities are seen.  IMPRESSION: No acute pulmonary process.   Electronically Signed   By: Jeb Levering M.D.   On: 07/22/2014 01:31     EKG  Interpretation   Date/Time:  Thursday July 22 2014 00:43:02 EDT Ventricular Rate:  93 PR Interval:  164 QRS Duration: 98 QT Interval:  355 QTC Calculation: 441 R Axis:   54 Text Interpretation:  Sinus rhythm Normal ECG No old tracing to compare  Confirmed by Tjay Velazquez  MD-I, Debby Clyne ([id number]) on 07/22/2014 1:10:32 AM      MDM   Final diagnoses:  NSTEMI (non-ST elevated myocardial infarction)    Plan transfer to [facility] for admission  Rolland Porter, MD, FACEP  CRITICAL CARE Performed by: Rolland Porter L Total critical care time: 36 min Critical care time was exclusive of separately billable procedures and treating other patients. Critical care was necessary to treat or prevent imminent or life-threatening deterioration. Critical care was time spent personally by me on the following activities: development of treatment plan with patient and/or surrogate as well as nursing, discussions with consultants, evaluation of patient's response to treatment, examination of patient, obtaining history from patient or surrogate, ordering and performing treatments and interventions, ordering and review of laboratory studies, ordering and review of radiographic studies, pulse oximetry and re-evaluation of patient's condition.     Rolland Porter, MD 07/22/14 [phone]

## 2014-07-22 NOTE — Progress Notes (Signed)
CRITICAL VALUE ALERT  Critical value received:  Troponin 0.64  Date of notification:  07/22/2014  Time of notification:  1040  Critical value read back:Yes.    Nurse who received alert:  Edward Qualia RN  MD notified (1st page):  Hoi Meng  Time of first page:  0750  MD notified (2nd page):  Time of second page:  Responding MD:  Almyra Deforest  Time MD responded:  [phone]

## 2014-07-22 NOTE — Progress Notes (Signed)
Cath delayed until 9:00am tomorrow (07/23/14) due to few urgent cases this afternoon. Patient and wife informed and they were appreciative. NPO after midnight.

## 2014-07-22 NOTE — Progress Notes (Signed)
ANTICOAGULATION CONSULT NOTE - Follow Up Consult  Pharmacy Consult for Heparin Indication: chest pain/ACS  Allergies  Allergen Reactions  . Ciprofloxacin Itching  . Doxycycline Other (See Comments)    Redness of skin, burning sensation  . Flagyl [Metronidazole] Itching  . Morphine And Related Itching    Patient Measurements: Height: 6\' 6"  (198.1 cm) Weight: 249 lb 12.8 oz (113.309 kg) IBW/kg (Calculated) : 91.4 Heparin Dosing Weight: 113.3 kg  Vital Signs: Temp: 98.7 F (37.1 C) (06/02 0500) Temp Source: Oral (06/02 0500) BP: 120/82 mmHg (06/02 1105) Pulse Rate: 71 (06/02 1105)  Labs:  Recent Labs  07/22/14 0100 07/22/14 0158 07/22/14 0440 07/22/14 1005 07/22/14 1110  HGB 14.9  --  14.3  --   --   HCT 42.0  --  40.0  --   --   PLT 192  --  187  --   --   APTT  --  31  --   --   --   LABPROT  --  11.8  --   --   --   INR  --  0.84  --   --   --   HEPARINUNFRC  --   --   --   --  0.32  CREATININE 0.85  --  0.82  --   --   TROPONINI 0.22*  --  0.64* 0.94*  --     Estimated Creatinine Clearance: 144.3 mL/min (by C-G formula based on Cr of 0.82).   Medications:  Prescriptions prior to admission  Medication Sig Dispense Refill Last Dose  . diphenoxylate-atropine (LOMOTIL) 2.5-0.025 MG per tablet Take 2 tablets by mouth 2 (two) times daily. Twice daily and as needed for diarrhea   07/21/2014 at Unknown time  . ibuprofen (ADVIL,MOTRIN) 200 MG tablet Take 200 mg by mouth every 6 (six) hours as needed for headache or moderate pain.   Past Week at Unknown time  . omega-3 acid ethyl esters (LOVAZA) 1 G capsule Take 2 g by mouth 2 (two) times daily.     07/21/2014 at Unknown time  . OVER THE COUNTER MEDICATION Patient takes 2 by mouth daily - Vita - Sprout Capsules - Multi Vitamin   07/21/2014 at Unknown time    Assessment: 54 y.o. M transferred from [facility]. Continues on heparin for CP. + troponins  Anticoagulation: Heparin for r/o ACS. HL 0.32 in goal. CBC WNL. Plan  cath  Cardiovascular: HLD, TG 421. VSS Meds: ASA81, Lipitor80, Fenofibrate, metoprolol  Endocrinology: gout, TSH WNL  Gastrointestinal / Nutrition: ulcerative colitis  Nephrology: CrCl>100  Hematology / Oncology: secondary erythrocytosis due to hormone injection therapy  PTA Medication Issues: [place]  Best Practices  Goal of Therapy:  Heparin level 0.3-0.7 units/ml Monitor platelets by anticoagulation protocol: Yes   Plan:  Continue heparin infusion at 1600 units/hr Cath this afternoon.   Jadah Bobak S. Alford Highland, PharmD, [place] Clinical Staff Pharmacist Pager [phone]  Eilene Ghazi Stillinger 07/22/2014,12:16 PM

## 2014-07-22 NOTE — ED Notes (Signed)
Pt c/o chest pain that radiates to neck and jaw and bilateral arm weakness since yesterday.

## 2014-07-22 NOTE — Progress Notes (Signed)
ANTICOAGULATION CONSULT NOTE - Initial Consult  Pharmacy Consult for Heparin Indication: chest pain/ACS  Allergies  Allergen Reactions  . Ciprofloxacin Itching  . Doxycycline Other (See Comments)    Redness of skin, burning sensation  . Flagyl [Metronidazole] Itching  . Morphine And Related Itching    Patient Measurements: Height: 6\' 6"  (198.1 cm) Weight: 250 lb (113.399 kg) IBW/kg (Calculated) : 91.4 Heparin Dosing Weight: 113.4 kg  Vital Signs: Temp: 97.4 F (36.3 C) (06/02 0040) BP: 155/94 mmHg (06/02 0200) Pulse Rate: 93 (06/02 0200)  Labs:  Recent Labs  07/22/14 0100  HGB 14.9  HCT 42.0  PLT 192  CREATININE 0.85  TROPONINI 0.22*    Estimated Creatinine Clearance: 139.2 mL/min (by C-G formula based on Cr of 0.85).   Medical History: Past Medical History  Diagnosis Date  . Ulcerative colitis   . High cholesterol   . Gout 2014  . Fatigue 11/28/2012  . Erythrocytosis 11/28/2012    Medications:   (Not in a hospital admission)  Assessment: 54 yo male ED patient positive troponin, chest pain PTA medications reviewed Labs reviewed  Goal of Therapy:  Heparin level 0.3-0.7 units/ml Monitor platelets by anticoagulation protocol: Yes   Plan:  Give 4000 units bolus x 1 Start heparin infusion at 1600 units/hr Check anti-Xa level in 6 hours and daily while on heparin Continue to monitor H&H and platelets  Abner Greenspan, Kylie Simmonds Bennett 07/22/2014,2:09 AM

## 2014-07-22 NOTE — Progress Notes (Addendum)
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 Initial enzyme was elevated. Dr. Jules Cruz discussed with patient cardiac catheterization last night and he agree to proceed.   Subjective: CP resolved around midnight, has not occurred since. No SOB  Physical exam: General: NAD, A&O Heart: RRR, no murmur. 2+ radial pulse bilaterally Lung: CTA Abdomen: soft, nontender  Plan: Per Dr. Evette Cruz note, I will call cath lab and place him on cath board today. Notified by nurse about second trop trending up. However no CP at this time since midnight. Will continue IV heparin. I have personally discussed with patient benefit and risk of cardiac catheterization including bleeding, renal/vascular injury, arrhythmia, MI, stroke, loss of life or limb. He displayed clear understanding and agree to proceed.   Note triglyceride >400, on high dose statin, likely need finofibrate  Calvin Corrigan PA Pager: [phone]   The patient was seen, examined and discussed with Calvin Deforest, PA-C and I agree with the above.   The patient now chest pain free, we will add fenofibrates.  Calvin Spark, MD, [facility] 07/22/2014

## 2014-07-22 NOTE — Progress Notes (Signed)
UR Completed Nolita Kutter Graves-Bigelow, RN,BSN [phone]  

## 2014-07-22 NOTE — Care Management Note (Signed)
Case Management Note  Patient Details  Name: Calvin Cruz MRN: [id number] Date of Birth: 02-Jun-1960  Subjective/Objective: Pt admitted for Nstemi. Plan for cardiac cath 07-23-14.                   Action/Plan: CM to monitor for disposition needs.   Expected Discharge Date:                  Expected Discharge Plan:  Home/Self Care  In-House Referral:     Discharge planning Services  CM Consult  Post Acute Care Choice:    Choice offered to:     DME Arranged:    DME Agency:     HH Arranged:    Black Agency:     Status of Service:     Medicare Important Message Given:  No Date Medicare IM Given:    Medicare IM give by:    Date Additional Medicare IM Given:    Additional Medicare Important Message give by:     If discussed at [place] of Stay Meetings, dates discussed:    Additional Comments:  Bethena Roys, RN 07/22/2014, 3:11 PM

## 2014-07-22 NOTE — Progress Notes (Signed)
  Echocardiogram 2D Echocardiogram has been performed.  Calvin Cruz 07/22/2014, 12:18 PM

## 2014-07-23 ENCOUNTER — Encounter (HOSPITAL_COMMUNITY)
Admission: EM | Disposition: A | Discharge status: Home / Self Care | Payer: Managed Care, Other (non HMO) | Source: Home / Self Care | Attending: Cardiology

## 2014-07-23 DIAGNOSIS — I2510 Atherosclerotic heart disease of native coronary artery without angina pectoris: Secondary | ICD-10-CM

## 2014-07-23 DIAGNOSIS — E785 Hyperlipidemia, unspecified: Secondary | ICD-10-CM | POA: Insufficient documentation

## 2014-07-23 DIAGNOSIS — I10 Essential (primary) hypertension: Secondary | ICD-10-CM | POA: Insufficient documentation

## 2014-07-23 HISTORY — PX: CARDIAC CATHETERIZATION: SHX172

## 2014-07-23 LAB — CBC
HCT: 39.4 % (ref 39.0–52.0)
HEMOGLOBIN: 14.1 g/dL (ref 13.0–17.0)
MCH: 31.3 pg (ref 26.0–34.0)
MCHC: 35.8 g/dL (ref 30.0–36.0)
MCV: 87.6 fL (ref 78.0–100.0)
PLATELETS: 168 10*3/uL (ref 150–400)
RBC: 4.5 MIL/uL (ref 4.22–5.81)
RDW: 12.4 % (ref 11.5–15.5)
WBC: 6.1 10*3/uL (ref 4.0–10.5)

## 2014-07-23 LAB — BASIC METABOLIC PANEL
ANION GAP: 11 (ref 5–15)
BUN: 10 mg/dL (ref 6–20)
CHLORIDE: 102 mmol/L (ref 101–111)
CO2: 26 mmol/L (ref 22–32)
CREATININE: 0.66 mg/dL (ref 0.61–1.24)
Calcium: 9 mg/dL (ref 8.9–10.3)
GFR calc Af Amer: >60 mL/min (ref >60)
GLUCOSE: 143 mg/dL — AB (ref 65–99)
POTASSIUM: 3.6 mmol/L (ref 3.5–5.1)
SODIUM: 139 mmol/L (ref 135–145)

## 2014-07-23 LAB — GLUCOSE, CAPILLARY
GLUCOSE-CAPILLARY: 147 mg/dL — AB (ref 65–99)
GLUCOSE-CAPILLARY: 221 mg/dL — AB (ref 65–99)
GLUCOSE-CAPILLARY: 146 mg/dL — AB (ref 65–99)

## 2014-07-23 LAB — HEMOGLOBIN A1C
Hgb A1c MFr Bld: 6.8 % — ABNORMAL HIGH (ref 4.8–5.6)
Mean Plasma Glucose: 148 mg/dL

## 2014-07-23 LAB — HEPARIN LEVEL (UNFRACTIONATED): Heparin Unfractionated: 0.28 IU/mL — ABNORMAL LOW (ref 0.30–0.70)

## 2014-07-23 LAB — POCT ACTIVATED CLOTTING TIME: ACTIVATED CLOTTING TIME: 571 s

## 2014-07-23 SURGERY — LEFT HEART CATH AND CORONARY ANGIOGRAPHY

## 2014-07-23 MED ORDER — HEPARIN SODIUM (PORCINE) 1000 UNIT/ML IJ SOLN
INTRAMUSCULAR | Status: AC
  Filled 2014-07-23: qty 1

## 2014-07-23 MED ORDER — SODIUM CHLORIDE 0.9 % IV SOLN: 250.0000 mL | INTRAVENOUS | Status: DC | PRN

## 2014-07-23 MED ORDER — SODIUM CHLORIDE 0.9 % WEIGHT BASED INFUSION
3.0000 mL/kg/h | INTRAVENOUS | Status: AC
  Administered 2014-07-23: 3 mL/kg/h via INTRAVENOUS

## 2014-07-23 MED ORDER — INSULIN ASPART 100 UNIT/ML ~~LOC~~ SOLN: 0.0000 [IU] | Freq: Three times a day (TID) | SUBCUTANEOUS | Status: DC

## 2014-07-23 MED ORDER — ACETAMINOPHEN 325 MG PO TABS
ORAL_TABLET | ORAL | Status: AC
  Filled 2014-07-23: qty 2

## 2014-07-23 MED ORDER — SODIUM CHLORIDE 0.9 % IJ SOLN
3.0000 mL | Freq: Two times a day (BID) | INTRAMUSCULAR | Status: DC
  Administered 2014-07-23: 3 mL via INTRAVENOUS

## 2014-07-23 MED ORDER — VERAPAMIL HCL 2.5 MG/ML IV SOLN
INTRAVENOUS | Status: DC | PRN
  Administered 2014-07-23 (×2): via INTRA_ARTERIAL

## 2014-07-23 MED ORDER — FENTANYL CITRATE (PF) 100 MCG/2ML IJ SOLN
INTRAMUSCULAR | Status: DC | PRN
  Administered 2014-07-23: 50 ug via INTRAVENOUS

## 2014-07-23 MED ORDER — HEPARIN SODIUM (PORCINE) 1000 UNIT/ML IJ SOLN
INTRAMUSCULAR | Status: DC | PRN
  Administered 2014-07-23: 6000 [IU] via INTRAVENOUS

## 2014-07-23 MED ORDER — MIDAZOLAM HCL 2 MG/2ML IJ SOLN
INTRAMUSCULAR | Status: AC
  Filled 2014-07-23: qty 2

## 2014-07-23 MED ORDER — TICAGRELOR 90 MG PO TABS
ORAL_TABLET | ORAL | Status: DC | PRN
  Administered 2014-07-23: 180 mg via ORAL

## 2014-07-23 MED ORDER — ASPIRIN 81 MG PO CHEW
81.0000 mg | CHEWABLE_TABLET | ORAL | Status: AC
  Administered 2014-07-23: 81 mg via ORAL
  Filled 2014-07-23: qty 1

## 2014-07-23 MED ORDER — BIVALIRUDIN BOLUS VIA INFUSION - CUPID
INTRAVENOUS | Status: DC | PRN
  Administered 2014-07-23: 83.175 mg via INTRAVENOUS
  Administered 2014-07-23: 194.075 mg via INTRAVENOUS

## 2014-07-23 MED ORDER — SODIUM CHLORIDE 0.9 % IV SOLN
0.2500 mg/kg/h | INTRAVENOUS | Status: DC
  Filled 2014-07-23: qty 250

## 2014-07-23 MED ORDER — NITROGLYCERIN 1 MG/10 ML FOR IR/CATH LAB
INTRA_ARTERIAL | Status: AC
  Filled 2014-07-23: qty 10

## 2014-07-23 MED ORDER — INSULIN ASPART 100 UNIT/ML ~~LOC~~ SOLN: 0.0000 [IU] | Freq: Every day | SUBCUTANEOUS | Status: DC

## 2014-07-23 MED ORDER — VERAPAMIL HCL 2.5 MG/ML IV SOLN
INTRAVENOUS | Status: AC
  Filled 2014-07-23: qty 2

## 2014-07-23 MED ORDER — IOHEXOL 350 MG/ML SOLN
INTRAVENOUS | Status: DC | PRN
  Administered 2014-07-23: 195 mL via INTRA_ARTERIAL

## 2014-07-23 MED ORDER — TRAMADOL HCL 50 MG PO TABS
50.0000 mg | ORAL_TABLET | Freq: Two times a day (BID) | ORAL | Status: DC | PRN
  Administered 2014-07-23: 50 mg via ORAL
  Filled 2014-07-23: qty 1

## 2014-07-23 MED ORDER — LIDOCAINE HCL (PF) 1 % IJ SOLN
INTRAMUSCULAR | Status: AC
  Filled 2014-07-23: qty 30

## 2014-07-23 MED ORDER — SODIUM CHLORIDE 0.9 % IV SOLN
250.0000 mg | INTRAVENOUS | Status: DC | PRN
  Administered 2014-07-23: 1.75 mg/kg/h via INTRAVENOUS

## 2014-07-23 MED ORDER — NITROGLYCERIN 1 MG/10 ML FOR IR/CATH LAB
INTRA_ARTERIAL | Status: DC | PRN
  Administered 2014-07-23: 200 ug via INTRACORONARY

## 2014-07-23 MED ORDER — TICAGRELOR 90 MG PO TABS
90.0000 mg | ORAL_TABLET | Freq: Two times a day (BID) | ORAL | Status: DC
  Administered 2014-07-23: 90 mg via ORAL
  Filled 2014-07-23: qty 1

## 2014-07-23 MED ORDER — TICAGRELOR 90 MG PO TABS: 90.0000 mg | ORAL_TABLET | Freq: Two times a day (BID) | ORAL | Status: DC

## 2014-07-23 MED ORDER — HEPARIN (PORCINE) IN NACL 2-0.9 UNIT/ML-% IJ SOLN
INTRAMUSCULAR | Status: AC
  Filled 2014-07-23: qty 1500

## 2014-07-23 MED ORDER — FENTANYL CITRATE (PF) 100 MCG/2ML IJ SOLN
INTRAMUSCULAR | Status: AC
  Filled 2014-07-23: qty 2

## 2014-07-23 MED ORDER — BIVALIRUDIN 250 MG IV SOLR
INTRAVENOUS | Status: AC
Start: 2014-07-23 — End: 2014-07-23
  Filled 2014-07-23: qty 250

## 2014-07-23 MED ORDER — NITROGLYCERIN 0.4 MG/SPRAY TL SOLN
Status: DC | PRN
  Administered 2014-07-23: 1 via SUBLINGUAL

## 2014-07-23 MED ORDER — SODIUM CHLORIDE 0.9 % IV SOLN
INTRAVENOUS | Status: DC | PRN
  Administered 2014-07-23: 500 mL via INTRAVENOUS

## 2014-07-23 MED ORDER — TICAGRELOR 90 MG PO TABS
ORAL_TABLET | ORAL | Status: AC
  Filled 2014-07-23: qty 2

## 2014-07-23 MED ORDER — SODIUM CHLORIDE 0.9 % IJ SOLN: 3.0000 mL | INTRAMUSCULAR | Status: DC | PRN

## 2014-07-23 MED ORDER — MIDAZOLAM HCL 2 MG/2ML IJ SOLN
INTRAMUSCULAR | Status: DC | PRN
  Administered 2014-07-23: 1 mg via INTRAVENOUS
  Administered 2014-07-23: 2 mg via INTRAVENOUS

## 2014-07-23 SURGICAL SUPPLY — 21 items
BALLN TREK RX 2.5X15 (BALLOONS) ×3
BALLOON TREK RX 2.5X15 (BALLOONS) IMPLANT
CATH INFINITI 5FR ANG PIGTAIL (CATHETERS) ×3 IMPLANT
CATH INFINITI 5FR MULTPACK ANG (CATHETERS) IMPLANT
CATH OPTITORQUE TIG 4.0 5F (CATHETERS) ×3 IMPLANT
CATH VISTA GUIDE 6FR JL4 (CATHETERS) ×2 IMPLANT
CATH VISTA GUIDE 6FR XBLAD3.5 (CATHETERS) ×2 IMPLANT
DEVICE RAD COMP TR BAND LRG (VASCULAR PRODUCTS) ×3 IMPLANT
GLIDESHEATH SLEND A-KIT 6F 22G (SHEATH) ×3 IMPLANT
KIT ENCORE 26 ADVANTAGE (KITS) ×2 IMPLANT
KIT HEART LEFT (KITS) ×3 IMPLANT
PACK CARDIAC CATHETERIZATION (CUSTOM PROCEDURE TRAY) ×3 IMPLANT
SHEATH PINNACLE 5F 10CM (SHEATH) IMPLANT
STENT XIENCE ALPINE RX 2.75X18 (Permanent Stent) ×2 IMPLANT
SYR MEDRAD MARK V 150ML (SYRINGE) ×3 IMPLANT
TRANSDUCER W/STOPCOCK (MISCELLANEOUS) ×3 IMPLANT
TUBING CIL FLEX 10 FLL-RA (TUBING) ×3 IMPLANT
WIRE ASAHI PROWATER 180CM (WIRE) ×2 IMPLANT
WIRE EMERALD 3MM-J .035X150CM (WIRE) IMPLANT
WIRE HI TORQ BMW 190CM (WIRE) ×2 IMPLANT
WIRE SAFE-T 1.5MM-J .035X260CM (WIRE) ×3 IMPLANT

## 2014-07-23 NOTE — Progress Notes (Addendum)
TR BAND REMOVAL  LOCATION:   rightl radial  DEFLATED PER PROTOCOL:    Yes.    TIME BAND OFF / DRESSING APPLIED:    1500  SITE UPON ARRIVAL:    Level 0  SITE AFTER BAND REMOVAL:    Level 0  REVERSE ALLEN'S TEST:     positive  CIRCULATION SENSATION AND MOVEMENT:    Within Normal Limits   Yes.    COMMENTS:   Pt denies any discomfort at site at this time.

## 2014-07-23 NOTE — Progress Notes (Signed)
ANTICOAGULATION CONSULT NOTE - Follow Up Consult  Pharmacy Consult for heparin Indication: NSTEMI   Labs:  Recent Labs  07/22/14 0100 07/22/14 0158 07/22/14 0440 07/22/14 1005 07/22/14 1110 07/22/14 1635 07/23/14 0339  HGB 14.9  --  14.3  --   --   --  14.1  HCT 42.0  --  40.0  --   --   --  39.4  PLT 192  --  187  --   --   --  168  APTT  --  31  --   --   --   --   --   LABPROT  --  11.8  --   --   --   --   --   INR  --  0.84  --   --   --   --   --   HEPARINUNFRC  --   --   --   --  0.32  --  0.28*  CREATININE 0.85  --  0.82  --   --   --   --   TROPONINI 0.22*  --  0.64* 0.94*  --  0.76*  --       Assessment: 54yo male now subtherapeutic on heparin after one level at low end of goal.  Goal of Therapy:  Heparin level 0.3-0.7 units/ml   Plan:  Will increase heparin gtt slightly to 1700 units/hr until off for cath.  Wynona Neat, PharmD, BCPS  07/23/2014,5:56 AM

## 2014-07-23 NOTE — Interval H&P Note (Signed)
History and Physical Interval Note:  07/23/2014 9:02 AM  Calvin Cruz  has presented today for surgery, with the diagnosis of NSTEMI.   The various methods of treatment have been discussed with the patient and family. After consideration of risks, benefits and other options for treatment, the patient has consented to  Procedure(s): Left Heart Cath and Coronary Angiography (N/A) as a surgical intervention .  The patient's history has been reviewed, patient examined, no change in status, stable for surgery.  I have reviewed the patient's chart and labs.  Questions were answered to the patient's satisfaction.     Davenport, Lindsay  Cath Lab Visit (complete for each Cath Lab visit)  Clinical Evaluation Leading to the Procedure:   ACS: Yes.    Non-ACS:    Anginal Classification: CCS IV  Anti-ischemic medical therapy: Maximal Therapy (2 or more classes of medications)  Non-Invasive Test Results: No non-invasive testing performed  Prior CABG: No previous CABG

## 2014-07-23 NOTE — Progress Notes (Signed)
CM spoke to pt/wife regarding Brilinta, phamplet given to pt with 30 day freecard and copay card..Pt uses [facility] in [place], [place], pharmacy([phone]) called per CM to confirm medication is in stock. CM shared with pt/wife medication is in stock. No other needs identified per CM. Whitman Hero RN,BSN,CM [phone]

## 2014-07-23 NOTE — Progress Notes (Signed)
Inpatient Diabetes Program Recommendations  AACE/ADA: New Consensus Statement on Inpatient Glycemic Control (2013)  Target Ranges:  Prepandial:   less than 140 mg/dL      Peak postprandial:   less than 180 mg/dL (1-2 hours)      Critically ill patients:  140 - 180 mg/dL   Diabetes history: None   Inpatient Diabetes Program Recommendations HgbA1C: A1c 6.8%. Per ADA this meets New DM diagnosis. If patient is to be diagnosed this admission, please consider odering a Dietitian and DM consult for Lifestyle management education.   Thanks,  Tama Headings RN, MSN, [facility] Inpatient Diabetes Coordinator Team Pager [phone]

## 2014-07-23 NOTE — Progress Notes (Signed)
Paged by nurse as pt having headache and slight atypical chest discomfort with SOB. Per nurse, headache not alleviated by tylenol.  Lung clear on exam. Heart RRR  Patient allergic to morphine. Dilaudid cause nausea and vomiting and require zofran (his symptom would not justify given dilaudid). Per wife he can tolerate tramadol.   Will order PRN tramadol, however discussed with patient and advised not to use narcotic as long as he can tolerates it. He denies any current CP and was asleep when i walked into the room.  Hilbert Corrigan PA Pager: [phone]

## 2014-07-23 NOTE — Progress Notes (Signed)
Patient Name: Calvin Cruz Date of Encounter: 07/23/2014  Primary Cardiologist: new- Dr. Meda Coffee   Principal Problem:   NSTEMI (non-ST elevated myocardial infarction) Active Problems:   Ulcerative colitis    SUBJECTIVE  Denies any CP or SOB. CP has not recurred since admission  CURRENT MEDS . [MAR Hold] aspirin EC  81 mg Oral Daily  . [MAR Hold] atorvastatin  80 mg Oral q1800  . bivalirudin (ANGIOMAX) infusion 5 mg/mL (Cath Lab,ACS,PCI indication)  0.25 mg/kg/hr Intravenous To Cath  . [MAR Hold] fenofibrate  160 mg Oral Daily  . [MAR Hold] metoprolol tartrate  25 mg Oral BID  . [MAR Hold] sodium chloride  3 mL Intravenous Q12H  . sodium chloride  3 mL Intravenous Q12H   OBJECTIVE  Filed Vitals:   07/22/14 1105 07/22/14 1323 07/22/14 2100 07/23/14 0529  BP: 120/82 113/73 116/75 110/68  Pulse: 71 65 74 67  Temp:  98.7 F (37.1 C) 97.7 F (36.5 C) 98 F (36.7 C)  TempSrc:  Oral Oral Oral  Resp:  19 18 18   Height:      Weight:    244 lb 9.6 oz (110.95 kg)  SpO2:  97% 96% 95%    Intake/Output Summary (Last 24 hours) at 07/23/14 0821 Last data filed at 07/22/14 2000  Gross per 24 hour  Intake    336 ml  Output   1000 ml  Net   -664 ml   Filed Weights   07/22/14 0040 07/22/14 0500 07/23/14 0529  Weight: 250 lb (113.399 kg) 249 lb 12.8 oz (113.309 kg) 244 lb 9.6 oz (110.95 kg)    PHYSICAL EXAM  General: Pleasant, NAD. Neuro: Alert and oriented X 3. Moves all extremities spontaneously. Psych: Normal affect. HEENT:  Normal  Neck: Supple without bruits or JVD. Lungs:  Resp regular and unlabored, CTA. Heart: RRR no s3, s4, or murmurs. Abdomen: Soft, non-tender, non-distended, BS + x 4.  Extremities: No clubbing, cyanosis or edema. DP/PT/Radials 2+ and equal bilaterally.  Accessory Clinical Findings  CBC  Recent Labs  07/22/14 0440 07/23/14 0339  WBC 6.8 6.1  HGB 14.3 14.1  HCT 40.0 39.4  MCV 87.0 87.6  PLT 187 073   Basic Metabolic  Panel  Recent Labs  07/22/14 0440 07/23/14 0339  NA 137 139  K 3.9 3.6  CL 100* 102  CO2 23 26  GLUCOSE 169* 143*  BUN 14 10  CREATININE 0.82 0.66  CALCIUM 9.6 9.0  MG 1.9  --    Cardiac Enzymes  Recent Labs  07/22/14 0440 07/22/14 1005 07/22/14 1635  TROPONINI 0.64* 0.94* 0.76*   Hemoglobin A1C  Recent Labs  07/22/14 0440  HGBA1C 6.8*   Fasting Lipid Panel  Recent Labs  07/22/14 0440  CHOL 198  HDL 29*  LDLCALC UNABLE TO CALCULATE IF TRIGLYCERIDE OVER 400 mg/dL  TRIG 421*  CHOLHDL 6.8   Thyroid Function Tests  Recent Labs  07/22/14 0440  TSH 2.944    TELE NSR    ECG  NSR with no significant ST-T wave changes  Echocardiogram 07/22/2014  LV EF: 55% -  60%  ------------------------------------------------------------------- Indications:   Chest pain 786.51.  ------------------------------------------------------------------- History:  PMH: High Cholesterol.  ------------------------------------------------------------------- Study Conclusions  - Left ventricle: The cavity size was normal. Systolic function was normal. The estimated ejection fraction was in the range of 55% to 60%. Wall motion was normal; there were no regional wall motion abnormalities. Left ventricular diastolic function parameters were normal.  Radiology/Studies  Dg Chest Port 1 View  07/22/2014   CLINICAL DATA:  Chest pain radiating to the neck, jaw, and bilateral arms. Symptoms for 3 hours.  EXAM: PORTABLE CHEST - 1 VIEW  COMPARISON:  None.  FINDINGS: The cardiomediastinal contours are normal. Pulmonary vasculature is normal. No consolidation, pleural effusion, or pneumothorax. Incidental note of an azygos fissure. No acute osseous abnormalities are seen.  IMPRESSION: No acute pulmonary process.   Electronically Signed   By: Jeb Levering M.D.   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 Initial enzyme was elevated. Dr. Jules Husbands discussed with patient cardiac catheterization last night and he agree to proceed.   1. NSTEMI  - Echo 07/22/2014 EF 55-60%, no RWMA  - pending cath today, discussed with patient benefit and risk of cardiac catheterization including bleeding, renal/vascular injury, arrhythmia, MI, stroke, loss of life or limb. He displayed clear understanding and agree to proceed.   2. Hyperlipidemia: triglyceride >400, on high dose statin, started on finofibrate  - per pt, he has been aware of it for several yrs, originally had trig >1200, tried diet with trig controlled to <500, started on lovaza by his PCP. PCP monitor every 3 month   3. h/o ulcerative colitis: large colon removed during last flair, no further problem or bleeding, no contraindication to DES  4. secondary erythrocytosis due to hormone injection therapy  Signed, Woodward Ku Pager: [phone]   The patient was seen, examined and discussed with Almyra Deforest, PA-C and I agree with the above.   54 year old male with NSTEMI - cath today showed Severe single-vessel disease involving the mid circumflex at the takeoff of OM1, s/p a Xience alpine drug-eluting stent placement reducing this circumflex stenosis to 0%. Well-preserved LVEF with normal LVEDP. Mild inferolateral hypokinesis. Dual antiplatelet therapy for minimum one year. Continue aggressive risk factor modification: Beta blocker, statin, plus or minus ACE inhibitor pending blood pressure. Fenofibrates added to atorvastatin for severe triglycerides elevation.  Anticipated discharge tomorrow.  Dorothy Spark, MD, [facility] 07/23/2014

## 2014-07-24 DIAGNOSIS — I214 Non-ST elevation (NSTEMI) myocardial infarction: Principal | ICD-10-CM

## 2014-07-24 LAB — BASIC METABOLIC PANEL
Anion gap: 9 (ref 5–15)
BUN: 6 mg/dL (ref 6–20)
CALCIUM: 9.1 mg/dL (ref 8.9–10.3)
CO2: 28 mmol/L (ref 22–32)
CREATININE: 0.79 mg/dL (ref 0.61–1.24)
Chloride: 103 mmol/L (ref 101–111)
GFR calc Af Amer: >60 mL/min (ref >60)
Glucose, Bld: 155 mg/dL — ABNORMAL HIGH (ref 65–99)
POTASSIUM: 4 mmol/L (ref 3.5–5.1)
SODIUM: 140 mmol/L (ref 135–145)

## 2014-07-24 LAB — CBC
HCT: 39.9 % (ref 39.0–52.0)
HEMOGLOBIN: 14.2 g/dL (ref 13.0–17.0)
MCH: 30.9 pg (ref 26.0–34.0)
MCHC: 35.6 g/dL (ref 30.0–36.0)
MCV: 86.9 fL (ref 78.0–100.0)
PLATELETS: 161 10*3/uL (ref 150–400)
RBC: 4.59 MIL/uL (ref 4.22–5.81)
RDW: 12.1 % (ref 11.5–15.5)
WBC: 6.9 10*3/uL (ref 4.0–10.5)

## 2014-07-24 LAB — GLUCOSE, CAPILLARY: Glucose-Capillary: 120 mg/dL — ABNORMAL HIGH (ref 65–99)

## 2014-07-24 MED ORDER — LISINOPRIL 5 MG PO TABS: 5.0000 mg | ORAL_TABLET | Freq: Every day | ORAL | Status: DC

## 2014-07-24 MED ORDER — FENOFIBRATE 160 MG PO TABS: 160.0000 mg | ORAL_TABLET | Freq: Every day | ORAL | Status: DC

## 2014-07-24 MED ORDER — ASPIRIN 81 MG PO TBEC: 81.0000 mg | DELAYED_RELEASE_TABLET | Freq: Every day | ORAL | Status: DC

## 2014-07-24 MED ORDER — METOPROLOL TARTRATE 25 MG PO TABS: 25.0000 mg | ORAL_TABLET | Freq: Two times a day (BID) | ORAL | Status: DC

## 2014-07-24 MED ORDER — TICAGRELOR 90 MG PO TABS: 90.0000 mg | ORAL_TABLET | Freq: Two times a day (BID) | ORAL | Status: DC

## 2014-07-24 MED ORDER — HEART ATTACK BOUNCING BOOK
Freq: Once | Status: AC
  Administered 2014-07-24: 05:00:00
  Filled 2014-07-24: qty 1

## 2014-07-24 MED ORDER — NITROGLYCERIN 0.4 MG SL SUBL: 0.4000 mg | SUBLINGUAL_TABLET | SUBLINGUAL | Status: DC | PRN

## 2014-07-24 MED ORDER — ANGIOPLASTY BOOK
Freq: Once | Status: AC
  Administered 2014-07-24: 05:00:00
  Filled 2014-07-24: qty 1

## 2014-07-24 MED ORDER — EXERCISE FOR HEART AND HEALTH BOOK
Freq: Once | Status: AC
  Administered 2014-07-24: 05:00:00
  Filled 2014-07-24: qty 1

## 2014-07-24 MED ORDER — ATORVASTATIN CALCIUM 80 MG PO TABS: 80.0000 mg | ORAL_TABLET | Freq: Every day | ORAL | Status: DC

## 2014-07-24 NOTE — Progress Notes (Signed)
CARDIAC REHAB PHASE I   PRE:  Rate/Rhythm: 80  BP:  Sitting: 142/93     SaO2: 96% RA  MODE:  Ambulation: 1000 ft   POST:  Rate/Rhythm: 87  BP:  Sitting: 151/98     SaO2: 96% RA  8:22am-9:05am Patient ambulated independently without any complaints. Educated patient, wife, and son on risk factors, exercising at home, NTG and chest pain, restrictions, nutrition, and stents.  Patient and family were receptive. Patient is interested in Cardiac Rehab at [facility]. Patient left sitting in bed with family at bedside.  Heiley Shaikh, [place], [place] 07/24/2014 9:02 AM

## 2014-07-24 NOTE — Plan of Care (Signed)
Problem: Consults Goal: PCI Patient Education (See Patient Education module for education specifics.)  Outcome: Progressing MI book ordered, angioplasty book and exercise for your heart book.

## 2014-07-24 NOTE — Discharge Summary (Signed)
Physician Discharge Summary     Cardiologist:  Ellyn Hack  Patient ID: Calvin Cruz MRN: [id number] DOB/AGE: [birth year] 56 y.o.  Admit date: 07/22/2014 Discharge date: 07/24/2014  Admission Diagnoses: NSTEMI  Discharge Diagnoses:  Principal Problem:   NSTEMI (non-ST elevated myocardial infarction) Active Problems:   Ulcerative colitis   Hyperlipidemia   Essential hypertension   Discharged Condition: stable  Hospital Course:   [name] is a 54 yo man with PMH of ulcerative colitis, secondary erythrocytosis and dyslipidemia who presented with chest pain to [facility]. He characterized his pain as substernal, pressure-like with some right jaw and arm pain/weakness that began one night prior to admission and returned night of admission lasting up to 4 hours that resolved by the time he reached the ER. He received aspirin and heparin gtt at [facility] and cardiology consulted for transfer to [facility]. He has not had recurrent pain. The pain finally resolved when he rested. His mother had CAD and 5v CABG.   He was continued on ASA, heparin.  Lopressor 25 bid added along with statin.  TG were 421( used to be >1200).  Fenofibrate and lovaza.  He underwent a LHC which revealed severe single-vessel disease involving the mid circumflex at the takeoff of OM1, s/p a Xience alpine drug-eluting stent placement reducing this circumflex stenosis to 0%.  Well-preserved LVEF with normal LVEDP. Mild inferolateral hypokinesis. Dual antiplatelet therapy with ASA, brilinta for minimum one year. Continue aggressive risk factor modification. The patient developed a migraine the night prior to DC.  Tramadol was given.  Echo 07/22/2014 EF 55-60%, no RWMA.  The patient was seen by Dr. Harl Bowie who felt he was stable for DC home.   Consults: Cardiac rehab  Significant Diagnostic Studies:  Lipid Panel     Component Value Date/Time   CHOL 198 07/22/2014 0440   TRIG 421* 07/22/2014 0440   HDL 29*  07/22/2014 0440   CHOLHDL 6.8 07/22/2014 0440   VLDL UNABLE TO CALCULATE IF TRIGLYCERIDE OVER 400 mg/dL 07/22/2014 0440   LDLCALC UNABLE TO CALCULATE IF TRIGLYCERIDE OVER 400 mg/dL 07/22/2014 0440     Treatments:  See above  Discharge Exam: Blood pressure 156/90, pulse 72, temperature 98.3 F (36.8 C), temperature source Oral, resp. rate 16, height 6\' 6"  (1.981 m), weight 246 lb 7.6 oz (111.8 kg), SpO2 98 %.   Disposition: 01-Home or Self Care      Discharge Instructions    Diet - low sodium heart healthy    Complete by:  As directed      Discharge instructions    Complete by:  As directed   No lifting with right arm for three days     Discharge instructions    Complete by:  As directed   Stay out of work until June 13.     Increase activity slowly    Complete by:  As directed             Medication List    TAKE these medications        aspirin 81 MG EC tablet  Take 1 tablet (81 mg total) by mouth daily.     atorvastatin 80 MG tablet  Commonly known as:  LIPITOR  Take 1 tablet (80 mg total) by mouth daily at 6 PM.     diphenoxylate-atropine 2.5-0.025 MG per tablet  Commonly known as:  LOMOTIL  Take 2 tablets by mouth 2 (two) times daily. Twice daily and as needed for diarrhea  fenofibrate 160 MG tablet  Take 1 tablet (160 mg total) by mouth daily.     ibuprofen 200 MG tablet  Commonly known as:  ADVIL,MOTRIN  Take 200 mg by mouth every 6 (six) hours as needed for headache or moderate pain.     lisinopril 5 MG tablet  Commonly known as:  PRINIVIL,ZESTRIL  Take 1 tablet (5 mg total) by mouth daily.     metoprolol tartrate 25 MG tablet  Commonly known as:  LOPRESSOR  Take 1 tablet (25 mg total) by mouth 2 (two) times daily.     nitroGLYCERIN 0.4 MG SL tablet  Commonly known as:  NITROSTAT  Place 1 tablet (0.4 mg total) under the tongue every 5 (five) minutes x 3 doses as needed for chest pain.     omega-3 acid ethyl esters 1 G capsule  Commonly  known as:  LOVAZA  Take 2 g by mouth 2 (two) times daily.     OVER THE COUNTER MEDICATION  Patient takes 2 by mouth daily - Vita - Sprout Capsules - Multi Vitamin     ticagrelor 90 MG Tabs tablet  Commonly known as:  BRILINTA  Take 1 tablet (90 mg total) by mouth 2 (two) times daily.       Follow-up Information    Follow up with [facility], Leonie Green, MD.   Specialty:  Cardiology   Why:  The office will call you with the follow up appt date and time.  The initial visist may be at the [facility].    Contact information:   [address] [place] [address] [phone]      Greater than 30 minutes was spent completing the patient's discharge.    SignedTarri Fuller, [place] 07/24/2014, 8:43 AM

## 2014-07-24 NOTE — Progress Notes (Addendum)
Subjective: Had a migraine last night.  Atypical CP. Resolved  Objective: Vital signs in last 24 hours: Temp:  [97.9 F (36.6 C)-98.6 F (37 C)] 98.6 F (37 C) (06/04 0403) Pulse Rate:  [0-89] 69 (06/04 0403) Resp:  [0-22] 12 (06/04 0403) BP: (107-152)/(61-95) 137/77 mmHg (06/04 0403) SpO2:  [0 %-99 %] 98 % (06/04 0403) Weight:  [246 lb 7.6 oz (111.8 kg)] 246 lb 7.6 oz (111.8 kg) (06/04 0038) Last BM Date: 07/23/14  Intake/Output from previous day: 06/03 0701 - 06/04 0700 In: 2492 [P.O.:1160; I.V.:1332] Out: 2500 [Urine:2500] Intake/Output this shift:    Medications Scheduled Meds: . aspirin EC  81 mg Oral Daily  . atorvastatin  80 mg Oral q1800  . bivalirudin (ANGIOMAX) infusion 5 mg/mL (Cath Lab,ACS,PCI indication)  0.25 mg/kg/hr Intravenous To Cath  . fenofibrate  160 mg Oral Daily  . insulin aspart  0-5 Units Subcutaneous QHS  . insulin aspart  0-9 Units Subcutaneous TID WC  . metoprolol tartrate  25 mg Oral BID  . sodium chloride  3 mL Intravenous Q12H  . ticagrelor  90 mg Oral BID   Continuous Infusions: . heparin 1,700 Units/hr (07/23/14 0720)   PRN Meds:.sodium chloride, sodium chloride, acetaminophen, nitroGLYCERIN, ondansetron (ZOFRAN) IV, sodium chloride, traMADol  PE: General appearance: alert, cooperative and no distress Lungs: clear to auscultation bilaterally Heart: regular rate and rhythm, S1, S2 normal, no murmur, click, rub or gallop Extremities: No LEE Pulses: 2+ and symmetric Skin: Warm and dry. Radial cath site stable Neurologic: Grossly normal  Lab Results:   Recent Labs  07/22/14 0440 07/23/14 0339 07/24/14 0253  WBC 6.8 6.1 6.9  HGB 14.3 14.1 14.2  HCT 40.0 39.4 39.9  PLT 187 168 161   BMET  Recent Labs  07/22/14 0440 07/23/14 0339 07/24/14 0253  NA 137 139 140  K 3.9 3.6 4.0  CL 100* 102 103  CO2 23 26 28   GLUCOSE 169* 143* 155*  BUN 14 10 6   CREATININE 0.82 0.66 0.79  CALCIUM 9.6 9.0 9.1   PT/INR  Recent  Labs  07/22/14 0158  LABPROT 11.8  INR 0.84   Cholesterol  Recent Labs  07/22/14 0440  CHOL 198   Lipid Panel     Component Value Date/Time   CHOL 198 07/22/2014 0440   TRIG 421* 07/22/2014 0440   HDL 29* 07/22/2014 0440   CHOLHDL 6.8 07/22/2014 0440   VLDL UNABLE TO CALCULATE IF TRIGLYCERIDE OVER 400 mg/dL 07/22/2014 0440   LDLCALC UNABLE TO CALCULATE IF TRIGLYCERIDE OVER 400 mg/dL 07/22/2014 0440       Assessment/Plan  Principal Problem:   NSTEMI (non-ST elevated myocardial infarction) Active Problems:   Ulcerative colitis   Hyperlipidemia   Essential hypertension  1. NSTEMI - Echo 07/22/2014 EF 55-60%, no RWMA -cath yesterday showed severe single-vessel disease involving the mid circumflex at the takeoff of OM1, s/p a Xience alpine drug-eluting stent placement reducing this circumflex stenosis to 0%. Well-preserved LVEF with normal LVEDP. Mild inferolateral hypokinesis. Dual antiplatelet therapy for minimum one year. Continue aggressive risk factor modification: Beta blocker, statin, plus or minus ACE inhibitor pending blood pressure.  Fenofibrates added to atorvastatin for severe triglycerides elevation.  Dietary changes discussed as well.  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  2. Hyperlipidemia: triglyceride >400, on high dose statin, started on fenofibrate - per pt, he has been aware of it for several yrs, originally had trig >1200, tried diet with trig controlled  to <500, started on lovaza by his PCP. PCP monitor every 3 month   3. h/o ulcerative colitis: large colon removed during last flair, no further problem or bleeding, no contraindication to DES  4. secondary erythrocytosis due to hormone injection therapy  Ok for DC home   LOS: 2 days    Tarri Fuller PA-C 07/24/2014 7:22 AM  Patient seen and discussed with PA Hager. Admitted with NSTEMI, s/p DES to LCX. Normal LVEF. On medical therapy with ASA 81, atorva 80, lopressor,  brillinta. Will start low dose ACE-I in setting of ACS and CAD and DM. Elevated HgbA1c of 6.6 consistent with DM, he will need to f/u with pcp to consider therapy. Fenofibrate added to regimen due to TGs in 400s. Will need f/u in 2 weeks, plan for discharge today.   Zandra Abts MD

## 2014-07-26 ENCOUNTER — Encounter (HOSPITAL_COMMUNITY): Payer: Self-pay | Admitting: Cardiology

## 2014-07-26 ENCOUNTER — Telehealth: Payer: Self-pay | Admitting: Cardiology

## 2014-07-26 NOTE — Telephone Encounter (Signed)
6.6.16 Received FMLA/Attending Physician Statement on my desk 07/26/14.  Sent to [employer] @ [facility] to send letter/packet for signed AUTH and pmt.  Sent 6.6.16 via I/O courier. lp

## 2014-07-27 MED FILL — Heparin Sodium (Porcine) 2 Unit/ML in Sodium Chloride 0.9%: INTRAMUSCULAR | Qty: 500 | Status: AC

## 2014-07-27 MED FILL — Lidocaine HCl Local Preservative Free (PF) Inj 1%: INTRAMUSCULAR | Qty: 30 | Status: AC

## 2014-08-03 ENCOUNTER — Telehealth: Payer: Self-pay | Admitting: *Deleted

## 2014-08-03 NOTE — Telephone Encounter (Signed)
Addendum: Correction-- faxed -signed by Dr Ellyn Hack cardiac rehab phase ll order to [facility]

## 2014-08-03 NOTE — Telephone Encounter (Signed)
FAXED  -SIGNED BY DR Glen Flora

## 2014-08-09 NOTE — Telephone Encounter (Signed)
Received FMLA Personal assistant Forms) and [employer] Illness Claim Form-Physician Statement from [employer].  On my desk 08/09/14.  Given to Earlie Server, RN for Dr Ellyn Hack to review , complete and sign

## 2014-08-10 ENCOUNTER — Encounter (HOSPITAL_COMMUNITY): Payer: Self-pay

## 2014-08-10 ENCOUNTER — Encounter (HOSPITAL_COMMUNITY)
Admission: RE | Admit: 2014-08-10 | Discharge: 2014-08-10 | Disposition: A | Discharge status: Home / Self Care | Payer: Managed Care, Other (non HMO) | Source: Ambulatory Visit | Attending: Cardiology | Admitting: Cardiology

## 2014-08-10 VITALS — BP 102/68 | HR 79 | Ht 78.0 in | Wt 240.1 lb

## 2014-08-10 DIAGNOSIS — I213 ST elevation (STEMI) myocardial infarction of unspecified site: Secondary | ICD-10-CM | POA: Diagnosis not present

## 2014-08-10 DIAGNOSIS — I214 Non-ST elevation (NSTEMI) myocardial infarction: Secondary | ICD-10-CM

## 2014-08-10 NOTE — Patient Instructions (Signed)
Pt has finished orientation and is scheduled to start CR on August 16, 2014 at 1100. Pt has been instructed to arrive to class 15 minutes early for scheduled class. Pt has been instructed to wear comfortable clothing and shoes with rubber soles. Pt has been told to take their medications 1 hour prior to coming to class.  If the patient is not going to attend class, he has been instructed to call.

## 2014-08-10 NOTE — Progress Notes (Signed)
Patient arrived at 1430.  Patient referred to Cardiac Rehab by Dr. Ellyn Hack due to MI (I21.4)  Dr. Ellyn Hack is his cardiologist and Dr. Forde Dandy is his PCP.  During orientation advised patient on arrival and appointment times what to wear, what to do before, during and after exercise.  Reviewed attendance and class policy.  Talked about inclement weather and class consultation policy. Patient is scheduled to start cardiac Rehab on August 16, 2014 at 1100.  Patient was advised to come to class 5 minutes before class starts.  He was also given instructions on meeting with the dietician and attending the Family Structure classes. Pt is eager to get started.  Patient able to finish pre walk test.  Education and orientation finished at 1535.

## 2014-08-10 NOTE — Progress Notes (Signed)
Cardiac/Pulmonary Rehab Medication Review by a Pharmacist  Does the patient  feel that his/her medications are working for him/her?  yes  Has the patient been experiencing any side effects to the medications prescribed?  yes  Does the patient measure his/her own blood pressure or blood glucose at home?  yes   Does the patient have any problems obtaining medications due to transportation or finances?   no  Understanding of regimen: excellent Understanding of indications: excellent Potential of compliance: excellent  Questions asked to Determine Patient Understanding of Medication Regimen:  1. What is the name of the medication?  2. What is the medication used for?  3. When should it be taken?  4. How much should be taken?  5. How will you take it?  6. What side effects should you report?  Understanding Defined as: Excellent: All questions above are correct Good: Questions 1-4 are correct Fair: Questions 1-2 are correct  Poor: 1 or none of the above questions are correct   Pharmacist comments: Pt is experiencing some dizziness and shortness of breath but not every day.  No other side effects reported.  Medication changes have been noted in chart.    Calvin Cruz A 08/10/2014 2:45 PM

## 2014-08-12 NOTE — Telephone Encounter (Signed)
Placed signed fmla papers on lynn's desk

## 2014-08-13 ENCOUNTER — Telehealth: Payer: Self-pay | Admitting: Cardiology

## 2014-08-13 NOTE — Telephone Encounter (Signed)
Received signed FMLA/Physician Statement back from Dr Ellyn Hack.  Notified patient that forms were completed and signed.  Faxed forms to [place].  Mailed patient copies. 08/13/14. lp

## 2014-08-16 ENCOUNTER — Encounter (HOSPITAL_COMMUNITY)
Admission: RE | Admit: 2014-08-16 | Discharge: 2014-08-16 | Disposition: A | Discharge status: Home / Self Care | Payer: Managed Care, Other (non HMO) | Source: Ambulatory Visit | Attending: Cardiology | Admitting: Cardiology

## 2014-08-16 DIAGNOSIS — I213 ST elevation (STEMI) myocardial infarction of unspecified site: Secondary | ICD-10-CM | POA: Diagnosis not present

## 2014-08-18 ENCOUNTER — Encounter (HOSPITAL_COMMUNITY)
Admission: RE | Admit: 2014-08-18 | Discharge: 2014-08-18 | Disposition: A | Discharge status: Home / Self Care | Payer: Managed Care, Other (non HMO) | Source: Ambulatory Visit | Attending: Cardiology | Admitting: Cardiology

## 2014-08-18 DIAGNOSIS — I213 ST elevation (STEMI) myocardial infarction of unspecified site: Secondary | ICD-10-CM | POA: Diagnosis not present

## 2014-08-18 NOTE — Progress Notes (Signed)
Cardiac Rehabilitation Program Outcomes Report   Orientation:  08/10/14 Graduate Date:  tbd Discharge Date:  tbd # of sessions completed: 3  Cardiologist: Benita Stabile MD:  Forde Dandy Class Time:  1100  A.  Exercise Program:  Tolerates exercise @ 3.71 METS for 15 minutes and Walk Test Results:  Pre: 3.03  B.  Mental Health:  Good mental attitude  C.  Education/Instruction/Skills  Accurately checks own pulse.  Rest:  71  Exercise:  102  Uses Perceived Exertion Scale and/or Dyspnea Scale  D.  Nutrition/Weight Control/Body Composition:  Adherence to prescribed nutrition program: fair    E.  Blood Lipids    Lab Results  Component Value Date   CHOL 198 07/22/2014   HDL 29* 07/22/2014   LDLCALC UNABLE TO CALCULATE IF TRIGLYCERIDE OVER 400 mg/dL 07/22/2014   TRIG 421* 07/22/2014   CHOLHDL 6.8 07/22/2014    F.  Lifestyle Changes:  Making positive lifestyle changes  G.  Symptoms noted with exercise:  Asymptomatic  Report Completed By:  Stevphen Rochester RN   Comments:  This is patients first week in [facility]

## 2014-08-20 ENCOUNTER — Encounter (HOSPITAL_COMMUNITY)
Admission: RE | Admit: 2014-08-20 | Discharge: 2014-08-20 | Disposition: A | Discharge status: Home / Self Care | Payer: Managed Care, Other (non HMO) | Source: Ambulatory Visit | Attending: Cardiology | Admitting: Cardiology

## 2014-08-20 DIAGNOSIS — I213 ST elevation (STEMI) myocardial infarction of unspecified site: Secondary | ICD-10-CM | POA: Diagnosis present

## 2014-08-23 ENCOUNTER — Encounter (HOSPITAL_COMMUNITY): Payer: Managed Care, Other (non HMO)

## 2014-08-25 ENCOUNTER — Encounter (HOSPITAL_COMMUNITY)
Admission: RE | Admit: 2014-08-25 | Discharge: 2014-08-25 | Disposition: A | Discharge status: Home / Self Care | Payer: Managed Care, Other (non HMO) | Source: Ambulatory Visit | Attending: Cardiology | Admitting: Cardiology

## 2014-08-25 DIAGNOSIS — I213 ST elevation (STEMI) myocardial infarction of unspecified site: Secondary | ICD-10-CM | POA: Diagnosis not present

## 2014-08-27 ENCOUNTER — Encounter (HOSPITAL_COMMUNITY)
Admission: RE | Admit: 2014-08-27 | Discharge: 2014-08-27 | Disposition: A | Discharge status: Home / Self Care | Payer: Managed Care, Other (non HMO) | Source: Ambulatory Visit | Attending: Cardiology | Admitting: Cardiology

## 2014-08-27 DIAGNOSIS — I213 ST elevation (STEMI) myocardial infarction of unspecified site: Secondary | ICD-10-CM | POA: Diagnosis not present

## 2014-08-30 ENCOUNTER — Encounter (HOSPITAL_COMMUNITY)
Admission: RE | Admit: 2014-08-30 | Discharge: 2014-08-30 | Disposition: A | Discharge status: Home / Self Care | Payer: Managed Care, Other (non HMO) | Source: Ambulatory Visit | Attending: Cardiology | Admitting: Cardiology

## 2014-08-30 DIAGNOSIS — I213 ST elevation (STEMI) myocardial infarction of unspecified site: Secondary | ICD-10-CM | POA: Diagnosis not present

## 2014-09-01 ENCOUNTER — Encounter (HOSPITAL_COMMUNITY): Payer: Managed Care, Other (non HMO)

## 2014-09-01 ENCOUNTER — Encounter: Payer: Self-pay | Admitting: Physician Assistant

## 2014-09-01 ENCOUNTER — Ambulatory Visit (INDEPENDENT_AMBULATORY_CARE_PROVIDER_SITE_OTHER): Payer: Managed Care, Other (non HMO) | Admitting: Physician Assistant

## 2014-09-01 VITALS — BP 112/62 | HR 60 | Wt 240.8 lb

## 2014-09-01 DIAGNOSIS — E785 Hyperlipidemia, unspecified: Secondary | ICD-10-CM

## 2014-09-01 DIAGNOSIS — D751 Secondary polycythemia: Secondary | ICD-10-CM

## 2014-09-01 DIAGNOSIS — K51919 Ulcerative colitis, unspecified with unspecified complications: Secondary | ICD-10-CM

## 2014-09-01 DIAGNOSIS — I10 Essential (primary) hypertension: Secondary | ICD-10-CM

## 2014-09-01 DIAGNOSIS — I2510 Atherosclerotic heart disease of native coronary artery without angina pectoris: Secondary | ICD-10-CM | POA: Diagnosis not present

## 2014-09-01 MED ORDER — LISINOPRIL 2.5 MG PO TABS: 2.5000 mg | ORAL_TABLET | Freq: Every day | ORAL | Status: DC

## 2014-09-01 NOTE — Progress Notes (Signed)
Cardiology Office Note   Date:  09/01/2014   ID:  Calvin Cruz, DOB 09-18-1960, MRN [id number]  PCP:  Calvin Stack, MD  Cardiologist:  Dr. Glenetta Hew     Chief Complaint  Patient presents with  . Hospitalization Follow-up    Non-STEMI >> DES to LCx  . Coronary Artery Disease     History of Present Illness: Calvin Cruz is a 54 y.o. male with a hx of ulcerative colitis, secondary erythrocytosis, HL. Admitted 6/2-6/4 with a non-STEMI. Originally presented [facility] with sub-sternal chest discomfort with right jaw pain. He was transferred to [facility] for further evaluation and treatment. LHC demonstrated severe single-vessel disease with 90% stenosis of the mid LCx at the bifurcation of OM1. This was treated with a Xience DES. Echocardiogram demonstrated normal LV function. Of note, A1c was 6.6 suggestive of diabetes. Follow-up with PCP as recommended. He was treated with high-dose statin as well as fenofibrate in the setting of significantly elevated triglycerides. He returns for follow-up.  Since DC from the hospital, he has been doing well. He denies any further chest discomfort. He has been walking and going to cardiac rehabilitation. He denies significant dyspnea. He does get dizzy if he bends over and stands up quickly. He denies syncope. He denies orthopnea, PND or edema.   Studies/Reports Reviewed Today:  LHC/PCI 07/23/14 LM: Okay LAD: Proximal to mid 40% RI: 30% LCx: Mid to distal 90%, distal 20%, OM1 50% RCA: Mild disease EF: Mid inferolateral HK, EF 55-65% PCI: 2.75 x 18 mm Xience Alpine DES to mid LCx at takeoff of OM1 (OM1 jailed 50% stenosis)  Echo 07/22/14 EF 55-60%, normal wall motion, normal diastolic function    Past Medical History  Diagnosis Date  . Ulcerative colitis   . High cholesterol   . Gout 2014  . Fatigue 11/28/2012  . Erythrocytosis 11/28/2012    Past Surgical History  Procedure Laterality Date  .  Colectomy    . Hernia repair    . Cardiac catheterization N/A 07/23/2014    Procedure: Left Heart Cath and Coronary Angiography;  Surgeon: Leonie Man, MD;  Location: [place] [facility];  Service: Cardiovascular;  Laterality: N/A;  . Cardiac catheterization N/A 07/23/2014    Procedure: Coronary Stent Intervention;  Surgeon: Leonie Man, MD;  Location: [place] [facility];  Service: Cardiovascular;  Laterality: N/A;     Current Outpatient Prescriptions  Medication Sig Dispense Refill  . acetaminophen (TYLENOL) 325 MG tablet Take 650 mg by mouth every 6 (six) hours as needed for mild pain.    Marland Kitchen acidophilus (RISAQUAD) CAPS capsule Take 2 capsules by mouth daily.    Marland Kitchen aspirin EC 81 MG EC tablet Take 1 tablet (81 mg total) by mouth daily.    Marland Kitchen atorvastatin (LIPITOR) 80 MG tablet Take 1 tablet (80 mg total) by mouth daily at 6 PM. 30 tablet 11  . diphenoxylate-atropine (LOMOTIL) 2.5-0.025 MG per tablet Take 2 tablets by mouth 2 (two) times daily. Twice daily and as needed for diarrhea    . fenofibrate 160 MG tablet Take 1 tablet (160 mg total) by mouth daily. 30 tablet 11  . lisinopril (PRINIVIL,ZESTRIL) 2.5 MG tablet Take 1 tablet (2.5 mg total) by mouth daily. 30 tablet 11  . metoprolol tartrate (LOPRESSOR) 25 MG tablet Take 1 tablet (25 mg total) by mouth 2 (two) times daily. 60 tablet 11  . nitroGLYCERIN (NITROSTAT) 0.4 MG SL tablet Place 1 tablet (0.4 mg  total) under the tongue every 5 (five) minutes x 3 doses as needed for chest pain. 25 tablet 12  . OVER THE COUNTER MEDICATION Patient takes 2 by mouth daily - Vita - Sprout Capsules - Multi Vitamin    . rifaximin (XIFAXAN) 550 MG TABS tablet Take 550 mg by mouth 2 (two) times daily.    . ticagrelor (BRILINTA) 90 MG TABS tablet Take 1 tablet (90 mg total) by mouth 2 (two) times daily. 60 tablet 11   No current facility-administered medications for this visit.    Allergies:   Flagyl; Ciprofloxacin; Doxycycline; and Morphine and related     Social History:  The patient  reports that he quit smoking about 12 years ago. His smoking use included Cigarettes. He has never used smokeless tobacco. He reports that he does not drink alcohol or use illicit drugs.   Family History:  The patient's family history includes Cancer (age of onset: 21) in his father; Coronary artery disease in his mother; Diabetes in his brother and mother.    ROS:   Please see the history of present illness.   Review of Systems  Cardiovascular: Positive for dyspnea on exertion.  Hematologic/Lymphatic: Bruises/bleeds easily.  All other systems reviewed and are negative.     PHYSICAL EXAM: VS:  BP 112/62 mmHg  Pulse 60  Wt 240 lb 12.8 oz (109.226 kg)    Wt Readings from Last 3 Encounters:  09/01/14 240 lb 12.8 oz (109.226 kg)  08/10/14 240 lb 1.3 oz (108.9 kg)  07/24/14 246 lb 7.6 oz (111.8 kg)     GEN: Well nourished, well developed, in no acute distress HEENT: normal Neck: no JVD, no masses Cardiac:  Normal S1/S2, RRR; no murmur ,  no rubs or gallops, no edema ; right wrist without hematoma or mass  Respiratory:  clear to auscultation bilaterally, no wheezing, rhonchi or rales. GI: soft, nontender, nondistended, + BS MS: no deformity or atrophy Skin: warm and dry  Neuro:  CNs II-XII intact, Strength and sensation are intact Psych: Normal affect   EKG:  EKG is ordered today.  It demonstrates:   NSR, HR 60, normal axis, no ST changes, no change since prior tracing.    Recent Labs: 07/22/2014: B Natriuretic Peptide 17.1; Magnesium 1.9; TSH 2.944 07/24/2014: BUN 6; Creatinine, Ser 0.79; Hemoglobin 14.2; Platelets 161; Potassium 4.0; Sodium 140    Lipid Panel    Component Value Date/Time   CHOL 198 07/22/2014 0440   TRIG 421* 07/22/2014 0440   HDL 29* 07/22/2014 0440   CHOLHDL 6.8 07/22/2014 0440   VLDL UNABLE TO CALCULATE IF TRIGLYCERIDE OVER 400 mg/dL 07/22/2014 0440   LDLCALC UNABLE TO CALCULATE IF TRIGLYCERIDE OVER 400 mg/dL  07/22/2014 0440      ASSESSMENT AND PLAN:  Coronary artery disease involving native coronary artery of native heart without angina pectoris:  No further angina. We discussed the importance of dual antiplatelet therapy. Of note, he had several mos of post neck pain prior to his MI.  This has resolved as well. He had xrays that were neg.  He saw PT and chiropractors without any improvement.  Question if this was his anginal equivalent.  He is already going to [facility]. He will continue this.  He does get dizzy after bending over at times.  I will decrease his Lisinopril to 2.5 mg QD.  Continue statin, beta-blocker.   Essential hypertension:  Controlled.  Adjust Lisinopril as noted.  Arrange FU BMET.   Hyperlipidemia:  Continue  statin, fenofibrate.  Arrange FU lipids and LFTs.   Ulcerative colitis, unspecified complication:  FU with GI as planned.   Glucose Intolerance:  He is working on diet and exercise.  He is losing weight. He will FU with PCP.      Medication Changes: Current medicines are reviewed at length with the patient today.  Concerns regarding medicines are as outlined above.  The following changes have been made:   Discontinued Medications   No medications on file   Modified Medications   Modified Medication Previous Medication   LISINOPRIL (PRINIVIL,ZESTRIL) 2.5 MG TABLET lisinopril (PRINIVIL,ZESTRIL) 5 MG tablet      Take 1 tablet (2.5 mg total) by mouth daily.    Take 1 tablet (5 mg total) by mouth daily.   New Prescriptions   No medications on file    Labs/ tests ordered today include:   Orders Placed This Encounter  Procedures  . EKG 12-Lead     Disposition:   FU with Dr. Glenetta Hew in 3 mos.    Signed, Versie Starks, MHS 09/01/2014 5:28 PM    [facility] [place], [place], [place]  [address] Phone: [phone]; Fax: [phone]

## 2014-09-01 NOTE — Patient Instructions (Signed)
Medication Instructions:  1. DECREASE LISINOPRIL 2.5 MG DAILY; NEW RX SENT IN TODAY  Labwork: RX GIVEN TO HAVE LAB WORK DONE IN 2 WEEKS WITH THE RESULTS TO BE FAXED TO [facility], [place]  Testing/Procedures: NONE  Follow-Up: DR. HARDING IN 3 MONTHS  Any Other Special Instructions Will Be Listed Below (If Applicable).

## 2014-09-03 ENCOUNTER — Encounter (HOSPITAL_COMMUNITY)
Admission: RE | Admit: 2014-09-03 | Discharge: 2014-09-03 | Disposition: A | Discharge status: Home / Self Care | Payer: Managed Care, Other (non HMO) | Source: Ambulatory Visit | Attending: Cardiology | Admitting: Cardiology

## 2014-09-03 DIAGNOSIS — I213 ST elevation (STEMI) myocardial infarction of unspecified site: Secondary | ICD-10-CM | POA: Diagnosis not present

## 2014-09-06 ENCOUNTER — Encounter (HOSPITAL_COMMUNITY)
Admission: RE | Admit: 2014-09-06 | Discharge: 2014-09-06 | Disposition: A | Discharge status: Home / Self Care | Payer: Managed Care, Other (non HMO) | Source: Ambulatory Visit | Attending: Cardiology | Admitting: Cardiology

## 2014-09-06 DIAGNOSIS — I213 ST elevation (STEMI) myocardial infarction of unspecified site: Secondary | ICD-10-CM | POA: Diagnosis not present

## 2014-09-07 NOTE — Progress Notes (Signed)
Patient was given Individual Home Exercise Plan today, 09/06/2014. Handout was reviewed and discussed. Patient verbalized an understanding.

## 2014-09-08 ENCOUNTER — Encounter (HOSPITAL_COMMUNITY)
Admission: RE | Admit: 2014-09-08 | Discharge: 2014-09-08 | Disposition: A | Discharge status: Home / Self Care | Payer: Managed Care, Other (non HMO) | Source: Ambulatory Visit | Attending: Cardiology | Admitting: Cardiology

## 2014-09-08 DIAGNOSIS — I213 ST elevation (STEMI) myocardial infarction of unspecified site: Secondary | ICD-10-CM | POA: Diagnosis not present

## 2014-09-10 ENCOUNTER — Encounter (HOSPITAL_COMMUNITY)
Admission: RE | Admit: 2014-09-10 | Discharge: 2014-09-10 | Disposition: A | Discharge status: Home / Self Care | Payer: Managed Care, Other (non HMO) | Source: Ambulatory Visit | Attending: Cardiology | Admitting: Cardiology

## 2014-09-10 DIAGNOSIS — I213 ST elevation (STEMI) myocardial infarction of unspecified site: Secondary | ICD-10-CM | POA: Diagnosis not present

## 2014-09-13 ENCOUNTER — Encounter (HOSPITAL_COMMUNITY)
Admission: RE | Admit: 2014-09-13 | Discharge: 2014-09-13 | Disposition: A | Discharge status: Home / Self Care | Payer: Managed Care, Other (non HMO) | Source: Ambulatory Visit | Attending: Cardiology | Admitting: Cardiology

## 2014-09-13 DIAGNOSIS — I213 ST elevation (STEMI) myocardial infarction of unspecified site: Secondary | ICD-10-CM | POA: Diagnosis not present

## 2014-09-15 ENCOUNTER — Encounter (HOSPITAL_COMMUNITY)
Admission: RE | Admit: 2014-09-15 | Discharge: 2014-09-15 | Disposition: A | Discharge status: Home / Self Care | Payer: Managed Care, Other (non HMO) | Source: Ambulatory Visit | Attending: Cardiology | Admitting: Cardiology

## 2014-09-15 DIAGNOSIS — I213 ST elevation (STEMI) myocardial infarction of unspecified site: Secondary | ICD-10-CM | POA: Diagnosis not present

## 2014-09-16 ENCOUNTER — Telehealth: Payer: Self-pay | Admitting: Physician Assistant

## 2014-09-16 DIAGNOSIS — E785 Hyperlipidemia, unspecified: Secondary | ICD-10-CM

## 2014-09-16 DIAGNOSIS — I2510 Atherosclerotic heart disease of native coronary artery without angina pectoris: Secondary | ICD-10-CM

## 2014-09-16 DIAGNOSIS — I10 Essential (primary) hypertension: Secondary | ICD-10-CM

## 2014-09-16 NOTE — Telephone Encounter (Signed)
S/w pt's wife and pt about pt's c/o severe leg cramps. Wife states this has happened x 2 since his MI in 07/2014, new meds started in the hospital. Lisinopril was decreased 7/13 when pt saw PA. She denies any swelling, redness. I will d/w PA and cb   By Michae Kava, CMA

## 2014-09-16 NOTE — Telephone Encounter (Signed)
New message      Pt wife states pt is having severe leg cramps at night Please call to discuss

## 2014-09-16 NOTE — Telephone Encounter (Signed)
Check BMET, Mg2+. Hold Lipitor for 1 week.  If this helps, change to Crestor 40 mg QD.  If no help, resume Lipitor 80 mg QD. Arrange FU with PCP to evaluate for other causes. Richardson Dopp, PA-C   09/16/2014 1:19 PM

## 2014-09-17 ENCOUNTER — Other Ambulatory Visit (HOSPITAL_COMMUNITY)
Admission: RE | Admit: 2014-09-17 | Discharge: 2014-09-17 | Disposition: A | Discharge status: Home / Self Care | Payer: Managed Care, Other (non HMO) | Source: Ambulatory Visit | Attending: Physician Assistant | Admitting: Physician Assistant

## 2014-09-17 ENCOUNTER — Encounter (HOSPITAL_COMMUNITY): Admission: RE | Admit: 2014-09-17 | Payer: Managed Care, Other (non HMO) | Source: Ambulatory Visit

## 2014-09-17 DIAGNOSIS — I2510 Atherosclerotic heart disease of native coronary artery without angina pectoris: Secondary | ICD-10-CM | POA: Insufficient documentation

## 2014-09-17 DIAGNOSIS — E785 Hyperlipidemia, unspecified: Secondary | ICD-10-CM | POA: Insufficient documentation

## 2014-09-17 LAB — LIPID PANEL
CHOLESTEROL: 144 mg/dL (ref 0–200)
HDL: 36 mg/dL — ABNORMAL LOW (ref >40)
LDL Cholesterol: 84 mg/dL (ref 0–99)
Total CHOL/HDL Ratio: 4 RATIO
Triglycerides: 119 mg/dL (ref <150)
VLDL: 24 mg/dL (ref 0–40)

## 2014-09-17 LAB — BASIC METABOLIC PANEL
Anion gap: 7 (ref 5–15)
BUN: 18 mg/dL (ref 6–20)
CO2: 26 mmol/L (ref 22–32)
CREATININE: 0.9 mg/dL (ref 0.61–1.24)
Calcium: 9.3 mg/dL (ref 8.9–10.3)
Chloride: 105 mmol/L (ref 101–111)
Glucose, Bld: 136 mg/dL — ABNORMAL HIGH (ref 65–99)
Potassium: 4 mmol/L (ref 3.5–5.1)
SODIUM: 138 mmol/L (ref 135–145)

## 2014-09-17 LAB — HEPATIC FUNCTION PANEL
ALT: 25 U/L (ref 17–63)
AST: 33 U/L (ref 15–41)
Albumin: 4.6 g/dL (ref 3.5–5.0)
Alkaline Phosphatase: 38 U/L (ref 38–126)
BILIRUBIN TOTAL: 0.6 mg/dL (ref 0.3–1.2)
Bilirubin, Direct: <0.1 mg/dL — ABNORMAL LOW (ref 0.1–0.5)
TOTAL PROTEIN: 7.5 g/dL (ref 6.5–8.1)

## 2014-09-17 LAB — MAGNESIUM: MAGNESIUM: 2 mg/dL (ref 1.7–2.4)

## 2014-09-17 NOTE — Telephone Encounter (Signed)
Pt's wife today made aware of recommendations to hold lipitor x 1 week; if no chnage then restart; if leg cramps better then will start crestor 40 mg daily; advised tcb next week to update PA . Rx sent to [facility] bmet, mag, L/L today.

## 2014-09-20 ENCOUNTER — Encounter (HOSPITAL_COMMUNITY)
Admission: RE | Admit: 2014-09-20 | Discharge: 2014-09-20 | Disposition: A | Discharge status: Home / Self Care | Payer: Managed Care, Other (non HMO) | Source: Ambulatory Visit | Attending: Cardiology | Admitting: Cardiology

## 2014-09-20 DIAGNOSIS — I213 ST elevation (STEMI) myocardial infarction of unspecified site: Secondary | ICD-10-CM | POA: Diagnosis not present

## 2014-09-22 ENCOUNTER — Encounter (HOSPITAL_COMMUNITY)
Admission: RE | Admit: 2014-09-22 | Discharge: 2014-09-22 | Disposition: A | Discharge status: Home / Self Care | Payer: Managed Care, Other (non HMO) | Source: Ambulatory Visit | Attending: Cardiology | Admitting: Cardiology

## 2014-09-22 DIAGNOSIS — I213 ST elevation (STEMI) myocardial infarction of unspecified site: Secondary | ICD-10-CM | POA: Diagnosis not present

## 2014-09-23 ENCOUNTER — Telehealth: Payer: Self-pay | Admitting: Physician Assistant

## 2014-09-23 DIAGNOSIS — E785 Hyperlipidemia, unspecified: Secondary | ICD-10-CM

## 2014-09-23 NOTE — Telephone Encounter (Signed)
New Message  Follow Up on how the patient is doing with the cramps after coming off of lipitor to switch to crestor. The cramps are gone. Please call back to discuss the labs

## 2014-09-24 ENCOUNTER — Encounter (HOSPITAL_COMMUNITY)
Admission: RE | Admit: 2014-09-24 | Discharge: 2014-09-24 | Disposition: A | Discharge status: Home / Self Care | Payer: Managed Care, Other (non HMO) | Source: Ambulatory Visit | Attending: Cardiology | Admitting: Cardiology

## 2014-09-24 DIAGNOSIS — I213 ST elevation (STEMI) myocardial infarction of unspecified site: Secondary | ICD-10-CM | POA: Diagnosis not present

## 2014-09-27 ENCOUNTER — Encounter (HOSPITAL_COMMUNITY)
Admission: RE | Admit: 2014-09-27 | Discharge: 2014-09-27 | Disposition: A | Discharge status: Home / Self Care | Payer: Managed Care, Other (non HMO) | Source: Ambulatory Visit | Attending: Cardiology | Admitting: Cardiology

## 2014-09-27 DIAGNOSIS — I213 ST elevation (STEMI) myocardial infarction of unspecified site: Secondary | ICD-10-CM | POA: Diagnosis not present

## 2014-09-29 ENCOUNTER — Encounter (HOSPITAL_COMMUNITY)
Admission: RE | Admit: 2014-09-29 | Discharge: 2014-09-29 | Disposition: A | Discharge status: Home / Self Care | Payer: Managed Care, Other (non HMO) | Source: Ambulatory Visit | Attending: Cardiology | Admitting: Cardiology

## 2014-09-29 DIAGNOSIS — I213 ST elevation (STEMI) myocardial infarction of unspecified site: Secondary | ICD-10-CM | POA: Diagnosis not present

## 2014-09-29 MED ORDER — ROSUVASTATIN CALCIUM 40 MG PO TABS: 40.0000 mg | ORAL_TABLET | Freq: Every day | ORAL | Status: DC

## 2014-09-29 NOTE — Progress Notes (Signed)
Cardiac Rehabilitation Program Outcomes Report   Orientation:  08/10/14 Graduate Date:  tbd Discharge Date:  tbd # of sessions completed: 18  Cardiologist: Benita Stabile MD:  Forde Dandy Class Time:  1100  A.  Exercise Program:  Tolerates exercise @ 3.70 METS for 15 minutes  B.  Mental Health:  Good mental attitude  C.  Education/Instruction/Skills  Accurately checks own pulse.  Rest:  64  Exercise:  88 and Knows THR for exercise  Uses Perceived Exertion Scale and/or Dyspnea Scale  D.  Nutrition/Weight Control/Body Composition:  Adherence to prescribed nutrition program: fair    E.  Blood Lipids    Lab Results  Component Value Date   CHOL 144 09/17/2014   HDL 36* 09/17/2014   LDLCALC 84 09/17/2014   TRIG 119 09/17/2014   CHOLHDL 4.0 09/17/2014    F.  Lifestyle Changes:  Making positive lifestyle changes  G.  Symptoms noted with exercise:  Asymptomatic  Report Completed By:  Stevphen Rochester RN   Comments:  This is the patients halfway progress note in [facility].

## 2014-09-29 NOTE — Telephone Encounter (Signed)
lmptcb to advise pt per our previous conversation 08/2014 about lipitor/crestor, that on 8/4 leg cramps better since d/c lipitor. See previous phone note 08/2014 per PA states if cleg cramps better off lipitor then start crestor 40 mg daily w/repeat labs.

## 2014-10-01 ENCOUNTER — Encounter (HOSPITAL_COMMUNITY)
Admission: RE | Admit: 2014-10-01 | Discharge: 2014-10-01 | Disposition: A | Discharge status: Home / Self Care | Payer: Managed Care, Other (non HMO) | Source: Ambulatory Visit | Attending: Cardiology | Admitting: Cardiology

## 2014-10-01 DIAGNOSIS — I213 ST elevation (STEMI) myocardial infarction of unspecified site: Secondary | ICD-10-CM | POA: Diagnosis not present

## 2014-10-01 NOTE — Telephone Encounter (Signed)
S/w pt's wife to see if they got my message from 50/10 about starting Crestor, wife said yes. Scheduled FLP/LFT 11/18. Wife states to me that Dr. Seward Grater (DDS) needs ok to clean pt's teeth, pt is on ASA and Brilinta. I advised I will d/w PA and cb on Mon., wife said ok; Wife then also states pt insurance co was needing more detailed information. I said I will ask our HIM Dept call her to find out exactly what it is the insurance co is needing, wife said ok and thank you.

## 2014-10-03 NOTE — Telephone Encounter (Signed)
Ok to clean teeth. He CANNOT stop Brilinta or ASA - he has to continue these both. If dentist needs to stop either of these, the patient will need to hold off on cleaning his teeth for 1 year. Richardson Dopp, PA-C   10/03/2014 7:51 PM

## 2014-10-04 ENCOUNTER — Encounter (HOSPITAL_COMMUNITY)
Admission: RE | Admit: 2014-10-04 | Discharge: 2014-10-04 | Disposition: A | Discharge status: Home / Self Care | Payer: Managed Care, Other (non HMO) | Source: Ambulatory Visit | Attending: Cardiology | Admitting: Cardiology

## 2014-10-04 DIAGNOSIS — I213 ST elevation (STEMI) myocardial infarction of unspecified site: Secondary | ICD-10-CM | POA: Diagnosis not present

## 2014-10-04 NOTE — Telephone Encounter (Signed)
Lmptcb to advise per Brynda Rim. PA pt CANNOT STOP Brilinta or ASA and that if he is needing his teeth cleaned that he will have to wait for 1 year.

## 2014-10-06 ENCOUNTER — Encounter (HOSPITAL_COMMUNITY)
Admission: RE | Admit: 2014-10-06 | Discharge: 2014-10-06 | Disposition: A | Discharge status: Home / Self Care | Payer: Managed Care, Other (non HMO) | Source: Ambulatory Visit | Attending: Cardiology | Admitting: Cardiology

## 2014-10-06 DIAGNOSIS — I213 ST elevation (STEMI) myocardial infarction of unspecified site: Secondary | ICD-10-CM | POA: Diagnosis not present

## 2014-10-07 ENCOUNTER — Telehealth: Payer: Self-pay | Admitting: Physician Assistant

## 2014-10-07 DIAGNOSIS — I2510 Atherosclerotic heart disease of native coronary artery without angina pectoris: Secondary | ICD-10-CM

## 2014-10-07 DIAGNOSIS — I214 Non-ST elevation (NSTEMI) myocardial infarction: Secondary | ICD-10-CM

## 2014-10-07 DIAGNOSIS — E785 Hyperlipidemia, unspecified: Secondary | ICD-10-CM

## 2014-10-07 NOTE — Telephone Encounter (Signed)
New message      Talk to Arbie Cookey about issues they have talked to you before----leg cramps/medication

## 2014-10-07 NOTE — Telephone Encounter (Signed)
I rtnd pt's call and lmptcb to disuss c/o leg cramps.

## 2014-10-08 ENCOUNTER — Encounter (HOSPITAL_COMMUNITY)
Admission: RE | Admit: 2014-10-08 | Discharge: 2014-10-08 | Disposition: A | Discharge status: Home / Self Care | Payer: Managed Care, Other (non HMO) | Source: Ambulatory Visit | Attending: Cardiology | Admitting: Cardiology

## 2014-10-08 DIAGNOSIS — I213 ST elevation (STEMI) myocardial infarction of unspecified site: Secondary | ICD-10-CM | POA: Diagnosis not present

## 2014-10-11 ENCOUNTER — Encounter (HOSPITAL_COMMUNITY)
Admission: RE | Admit: 2014-10-11 | Discharge: 2014-10-11 | Disposition: A | Discharge status: Home / Self Care | Payer: Managed Care, Other (non HMO) | Source: Ambulatory Visit | Attending: Cardiology | Admitting: Cardiology

## 2014-10-11 DIAGNOSIS — I213 ST elevation (STEMI) myocardial infarction of unspecified site: Secondary | ICD-10-CM | POA: Diagnosis not present

## 2014-10-11 NOTE — Telephone Encounter (Signed)
Pt has given me permission to s/w wife. I s/w wife about leg cramps. Wife states they d/c'd fenofibrate on their own a few days. They state cramping/myalgias are improving. On 8/12 advised d/c lipitor, start crestor 40, pt has not started crestor yet. They are both concerned that pt is not able to take statins. I advised there are new medications out there that are not statins and maybe we need to refer him to out Lipid Clinic to d/w pt about other options. I advised Brynda Rim. PA is out of the office and that I will d/w Tera Helper. NP as to further recommendations. Wife agreeable to plan of care.

## 2014-10-11 NOTE — Telephone Encounter (Signed)
Ok to refer to lipid clinic

## 2014-10-11 NOTE — Telephone Encounter (Signed)
Pt's wife notified of referral to [facility]. Wife said ok thank you.

## 2014-10-13 ENCOUNTER — Encounter (HOSPITAL_COMMUNITY)
Admission: RE | Admit: 2014-10-13 | Discharge: 2014-10-13 | Disposition: A | Discharge status: Home / Self Care | Payer: Managed Care, Other (non HMO) | Source: Ambulatory Visit | Attending: Cardiology | Admitting: Cardiology

## 2014-10-13 DIAGNOSIS — I213 ST elevation (STEMI) myocardial infarction of unspecified site: Secondary | ICD-10-CM | POA: Diagnosis not present

## 2014-10-15 ENCOUNTER — Ambulatory Visit (INDEPENDENT_AMBULATORY_CARE_PROVIDER_SITE_OTHER): Payer: Managed Care, Other (non HMO) | Admitting: Pharmacist

## 2014-10-15 ENCOUNTER — Encounter (HOSPITAL_COMMUNITY)
Admission: RE | Admit: 2014-10-15 | Discharge: 2014-10-15 | Disposition: A | Discharge status: Home / Self Care | Payer: Managed Care, Other (non HMO) | Source: Ambulatory Visit | Attending: Cardiology | Admitting: Cardiology

## 2014-10-15 DIAGNOSIS — I213 ST elevation (STEMI) myocardial infarction of unspecified site: Secondary | ICD-10-CM | POA: Diagnosis not present

## 2014-10-15 DIAGNOSIS — E785 Hyperlipidemia, unspecified: Secondary | ICD-10-CM | POA: Diagnosis not present

## 2014-10-15 MED ORDER — OMEGA-3-ACID ETHYL ESTERS 1 G PO CAPS
2.0000 g | ORAL_CAPSULE | Freq: Two times a day (BID) | ORAL | Status: DC
Start: 2014-10-15 — End: 2015-08-04

## 2014-10-15 NOTE — Patient Instructions (Addendum)
Start taking Crestor 20mg  (1/2 tablet) on Monday, Wednesday and Friday.    Add Lovaza 4 capsules each day  If you have any problems, please call Gay Filler at [phone].  We will recheck your labs in November.

## 2014-10-18 ENCOUNTER — Encounter (HOSPITAL_COMMUNITY)
Admission: RE | Admit: 2014-10-18 | Discharge: 2014-10-18 | Disposition: A | Discharge status: Home / Self Care | Payer: Managed Care, Other (non HMO) | Source: Ambulatory Visit | Attending: Cardiology | Admitting: Cardiology

## 2014-10-18 DIAGNOSIS — I213 ST elevation (STEMI) myocardial infarction of unspecified site: Secondary | ICD-10-CM | POA: Diagnosis not present

## 2014-10-18 NOTE — Progress Notes (Signed)
HPI: Calvin Cruz is a 54 y.o. male patient referred to lipid clinic by Richardson Dopp, PA.  He has a PMH significant for CAD s/p PCI of the LCx in June 2016, ulcerative colitis, secondary erythrocysosis, DM (recent A1c 6.6 but pt untreated at this time) and hyperlipidemia.  Pt reports he has had a history of TG > 1000mg /dL for severla years.  He has been on Lovaza and fenofibrate in the past.  On admission to the hospital, he was only taking Lovaza 4 gm/day.  TG were > 400 at that time.  Upon discharge, he was placed on fenofibrate 160mg  and Lipitor 80mg .  He called the office on 7/28 and reported extreme leg cramps.  He was instructed to start holding Lipitor if cramps resolved, would start Crestor 40mg  daily.  He called on 8/4 and cramps were better so Rx for Crestor was sent to pharmacy.  Of note, pt never started the Crestor.  He called back on 8/18 and stated he had continued cramps so the fenofibrate was held and patient sent to the Lipid clinic.  Pt states his leg cramps have resolved but he is still having issues with his hands.  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    Current Medications: none Intolerances: Lipitor 80mg , fenofibrate 160mg   Risk Factors: CAD s/p PCI, DM LDL goal: LDL < 70 mg/dL, non-HDL <100 mg/dL  Diet:  Breakfast- Nutragrain bar, water Lunch- grilled chicken salad, fried catfish and slaw, chicken salad, bison burger  Dinner- Wife cooks.  She is a type 1 diabetic so watches carbohydrates and sugars.  Snack- peanut butter and graham crackers  Exercise:  Cardiac rehab 3 days of the week  Family History:  Mother- high TG Dad- had heart problems but does not know what kind Uncle- MI in 67s  Social History: No alcohol.  Former smoker- quit in March 2003  Labs: 09/17/14- TC 114, TG 119, HDL 36, LDL 84 (on Lipitor 80mg  and Fenofibrate 160mg ) 07/2014-  TC 198, TG 421, HDL 29, LDL not calculated (Lovaza 4gm/day)  Past Medical History  Diagnosis Date  . Ulcerative colitis   . High cholesterol   . Gout 2014  . Fatigue 11/28/2012  . Erythrocytosis 11/28/2012    Current Outpatient Prescriptions on File Prior to Visit  Medication Sig Dispense Refill  . diphenoxylate-atropine (LOMOTIL) 2.5-0.025 MG per tablet Take 2 tablets by mouth 2 (two) times daily. Twice daily and as needed for diarrhea    . acetaminophen (TYLENOL) 325 MG tablet Take 650 mg by mouth every 6 (six) hours as needed for mild pain.    Marland Kitchen acidophilus (RISAQUAD) CAPS capsule Take 2 capsules by mouth daily.    Marland Kitchen aspirin EC 81 MG EC tablet Take 1 tablet (81 mg total) by mouth daily.    . fenofibrate 160 MG tablet Take 1 tablet (160 mg total) by mouth daily. 30 tablet 11  . lisinopril (PRINIVIL,ZESTRIL) 2.5 MG tablet Take 1 tablet (2.5 mg total) by mouth daily. 30 tablet 11  . metoprolol tartrate (LOPRESSOR) 25 MG tablet Take 1 tablet (25 mg total) by mouth 2 (two) times daily. 60 tablet 11  . nitroGLYCERIN (NITROSTAT) 0.4 MG SL tablet Place 1 tablet (0.4 mg total) under the tongue every 5 (five) minutes x 3 doses as needed for chest pain. 25 tablet 12  . OVER THE COUNTER MEDICATION Patient  takes 2 by mouth daily - Vita - Sprout Capsules - Multi Vitamin    . rifaximin (XIFAXAN) 550 MG TABS tablet Take 550 mg by mouth 2 (two) times daily.    . ticagrelor (BRILINTA) 90 MG TABS tablet Take 1 tablet (90 mg total) by mouth 2 (two) times daily. 60 tablet 11   No current facility-administered medications on file prior to visit.    Allergies  Allergen Reactions  . Flagyl [Metronidazole] Itching  . Ciprofloxacin Itching and Nausea And Vomiting  . Doxycycline Itching and Other (See Comments)    Redness of skin, burning sensation  . Morphine And Related Itching    Assessment/Plan:  1.  Hyperlipidemia-  Had long discussion with pt and his wife regarding the symptoms he had while on  Lipitor and fenofibrate.  Agree that the leg cramps were likely medication related but I am not sure his continued issues are medication related as they have not improved off medication and he has a job that puts him at risk for hand/wrist issues.  Myalgias were likely due to combination of high dose statin and fibrate.  I do not think he will be statin intolerant.  He is willing to try a lower dose of statin.  He already has the 40mg  tablet of Crestor.  Will start him with 20mg  three days of the week.   Given his high TG, not sure that Crestor will be able to control these alone.  Will add back Lovaza to avoid medication interactions with fibrate and potential of increased blood glucose with Niacin.  Pt has labs scheduled in November and will recheck cholesterol at that time.

## 2014-10-20 ENCOUNTER — Encounter (HOSPITAL_COMMUNITY)
Admission: RE | Admit: 2014-10-20 | Discharge: 2014-10-20 | Disposition: A | Discharge status: Home / Self Care | Payer: Managed Care, Other (non HMO) | Source: Ambulatory Visit | Attending: Cardiology | Admitting: Cardiology

## 2014-10-20 DIAGNOSIS — I213 ST elevation (STEMI) myocardial infarction of unspecified site: Secondary | ICD-10-CM | POA: Diagnosis not present

## 2014-10-22 ENCOUNTER — Encounter (HOSPITAL_COMMUNITY)
Admission: RE | Admit: 2014-10-22 | Discharge: 2014-10-22 | Disposition: A | Discharge status: Home / Self Care | Payer: Managed Care, Other (non HMO) | Source: Ambulatory Visit | Attending: Cardiology | Admitting: Cardiology

## 2014-10-22 DIAGNOSIS — I213 ST elevation (STEMI) myocardial infarction of unspecified site: Secondary | ICD-10-CM | POA: Diagnosis present

## 2014-10-25 ENCOUNTER — Encounter (HOSPITAL_COMMUNITY): Payer: Managed Care, Other (non HMO)

## 2014-10-27 ENCOUNTER — Encounter (HOSPITAL_COMMUNITY)
Admission: RE | Admit: 2014-10-27 | Discharge: 2014-10-27 | Disposition: A | Discharge status: Home / Self Care | Payer: Managed Care, Other (non HMO) | Source: Ambulatory Visit | Attending: Cardiology | Admitting: Cardiology

## 2014-10-27 DIAGNOSIS — I213 ST elevation (STEMI) myocardial infarction of unspecified site: Secondary | ICD-10-CM | POA: Diagnosis not present

## 2014-10-29 ENCOUNTER — Encounter (HOSPITAL_COMMUNITY): Payer: Managed Care, Other (non HMO)

## 2014-11-01 ENCOUNTER — Encounter (HOSPITAL_COMMUNITY)
Admission: RE | Admit: 2014-11-01 | Discharge: 2014-11-01 | Disposition: A | Discharge status: Home / Self Care | Payer: Managed Care, Other (non HMO) | Source: Ambulatory Visit | Attending: Cardiology | Admitting: Cardiology

## 2014-11-01 DIAGNOSIS — I213 ST elevation (STEMI) myocardial infarction of unspecified site: Secondary | ICD-10-CM | POA: Diagnosis not present

## 2014-11-03 ENCOUNTER — Encounter (HOSPITAL_COMMUNITY)
Admission: RE | Admit: 2014-11-03 | Discharge: 2014-11-03 | Disposition: A | Discharge status: Home / Self Care | Payer: Managed Care, Other (non HMO) | Source: Ambulatory Visit | Attending: Cardiology | Admitting: Cardiology

## 2014-11-03 DIAGNOSIS — I213 ST elevation (STEMI) myocardial infarction of unspecified site: Secondary | ICD-10-CM | POA: Diagnosis not present

## 2014-11-05 ENCOUNTER — Encounter (HOSPITAL_COMMUNITY)
Admission: RE | Admit: 2014-11-05 | Discharge: 2014-11-05 | Disposition: A | Discharge status: Home / Self Care | Payer: Managed Care, Other (non HMO) | Source: Ambulatory Visit | Attending: Cardiology | Admitting: Cardiology

## 2014-11-05 DIAGNOSIS — I213 ST elevation (STEMI) myocardial infarction of unspecified site: Secondary | ICD-10-CM | POA: Diagnosis not present

## 2014-11-08 ENCOUNTER — Encounter (HOSPITAL_COMMUNITY)
Admission: RE | Admit: 2014-11-08 | Discharge: 2014-11-08 | Disposition: A | Discharge status: Home / Self Care | Payer: Managed Care, Other (non HMO) | Source: Ambulatory Visit | Attending: Cardiology | Admitting: Cardiology

## 2014-11-08 DIAGNOSIS — I213 ST elevation (STEMI) myocardial infarction of unspecified site: Secondary | ICD-10-CM | POA: Diagnosis not present

## 2014-11-10 ENCOUNTER — Encounter (HOSPITAL_COMMUNITY): Payer: Managed Care, Other (non HMO)

## 2014-11-12 ENCOUNTER — Encounter (HOSPITAL_COMMUNITY): Payer: Managed Care, Other (non HMO)

## 2014-11-15 ENCOUNTER — Encounter (HOSPITAL_COMMUNITY)
Admission: RE | Admit: 2014-11-15 | Discharge: 2014-11-15 | Disposition: A | Discharge status: Home / Self Care | Payer: Managed Care, Other (non HMO) | Source: Ambulatory Visit | Attending: Cardiology | Admitting: Cardiology

## 2014-11-15 DIAGNOSIS — I213 ST elevation (STEMI) myocardial infarction of unspecified site: Secondary | ICD-10-CM | POA: Diagnosis not present

## 2014-11-16 ENCOUNTER — Encounter: Payer: Self-pay | Admitting: Cardiology

## 2014-11-16 DIAGNOSIS — E781 Pure hyperglyceridemia: Secondary | ICD-10-CM

## 2014-11-16 HISTORY — DX: Pure hyperglyceridemia: E78.1

## 2014-11-16 NOTE — Progress Notes (Signed)
PATIENT: Calvin Cruz MRN: [id number] DOB: 03-19-1960 PCP: Sheela Stack, MD  Clinic Note: Chief Complaint  Patient presents with  . Follow-up    pt c/o wrist pain//had stent put in, Dr. Oneta Rack thinks radial nerve damage  . Dizziness    when he goes from sitting to standing sometimes//has gotten much better     HPI: Calvin Cruz is a 54 y.o. male with a ulcerative colitis, secondary erythrocytosis and dyslipidemia who presented in transfer from [facility] and non-ST elevation MI on 07/22/2014 -- he underwent PCI to the circumflex with a Xience DES stent. He had some mild inferolateral hypokinesis on LV gram that was resolved by the time of his echocardiogram. He was discharged home after 2 days, stable with no current issues.  Studies/Reports Reviewed Today: PSH/PMH updated   LHC/PCI 07/23/14 RCA: Mild disease; LM: Okay ; LAD: Proximal to mid 40%; RI: 30%;LCx: Mid to distal 90%, distal 20%, OM1 50% EF: Mid inferolateral HK, EF 55-65% PCI: 2.75 x 18 mm Xience Alpine DES to mid LCx at takeoff of OM1 (OM1 jailed 50% stenosis)   Echo 07/22/14 EF 55-60%, normal wall motion, normal diastolic function,  no valve lesions   He was seen in initial hospital follow-up by Richardson Dopp, PA. He was doing well, denying any chest discomfort. He was doing cardiac rehabilitation. He did note myalgias.  During his hospitalization he was noted to have severe hypertriglyceridemia with triglycerides in the 1200 range. They went down to roughly 400. He was discharged on fenofibrate with Lovaza as well as atorvastatin. On August 12, he stopped atorvastatin for myalgias symptoms, and was told to switch to Crestor 40 mg. 10 days later, he had still not started the Crestor, but had also stopped fenofibrate because of leg cramps. He was suddenly referred to the lipid clinic with Elberta Leatherwood, [facility]. The feeling was that the myalgias were likely related to high-dose statin and  fenofibrate .  He  agreed to take a low-dose statin -- 20 g Crestor 3 days a week. He was also then restarted back on Lovaza. Plan was to have labs rechecked November.  He is currently doing cardiac rehabilitation.   Interval History: Calvin Cruz presents back for second post hospital follow-upno major complaints. The only issue is that he has had some neuropathic type symptoms in his left wrist. There is concern of possible radial nerve damage. It makes his hand less functional. Other than that he notices strength and usage of his hand is somewhat limited. He has a brace. Otherwise her cardiac standpoint, he is doing well without any complaints. He has a little prostatic dizziness but that got much better when his ACE inhibitor dose was cut in half. He denies any myalgias or arthralgias since increasing the Crestor dosing. He very much enjoys his cardiac rehabilitation, and has not gone back to doing a lot of his routine work.  Cardiovascular ROS: no chest pain or dyspnea on exertion positive for - Orthostatic dizziness negative for - edema, irregular heartbeat, loss of consciousness, orthopnea, palpitations, paroxysmal nocturnal dyspnea, rapid heart rate or shortness of breath : syncope/near-syncope; TIA/amaurosis fugax    Past Medical History  Diagnosis Date  . Ulcerative colitis   . Gout 2014  . Fatigue 11/28/2012  . Erythrocytosis 11/28/2012  . Non-STEMI (non-ST elevated myocardial infarction) 07/22/2014    a) RCA: OK, LM: Okay ; LAD: Proximal to mid 40%; RI: 30%;LCx: Mid to distal 90%, distal 20%, OM1 50% --> DES PCI  .  CAD S/P DES PCI-circumflex 07/22/2014    PCI mCx: PCI: 2.75 x 18 mm Xience Alpine DES to mid LCx at takeoff of OM1 (OM1 jailed 50% stenosis)  . Essential hypertension   . Hyperlipidemia with target LDL less than 70   . Hypertriglyceridemia 11/16/2014    Past Surgical History  Procedure Laterality Date  . Colectomy    . Hernia repair    . Cardiac catheterization N/A 07/23/2014     Procedure: Left Heart Cath and Coronary Angiography;  Surgeon: Leonie Man, MD;  Location: [place] [facility];  Service: Cardiovascular;  RCA: Mild disease; LM: Okay ; LAD: Proximal to mid 40%; RI: 30%;LCx: Mid to distal 90%, distal 20%, OM1 50%; Mid inferolateral HK, EF 55-65%   . Cardiac catheterization N/A 07/23/2014    Procedure: Coronary Stent Intervention;  Surgeon: Leonie Man, MD;  Location: [facility];  Service: Cardiovascular; PCI: 2.75 x 18 mm Xience Alpine DES to mid LCx at takeoff of OM1 (OM1 jailed 50% stenosis)  . Transthoracic echocardiogram  07/22/2014    EF 55-60%, normal wall motion, normal diastolic function,  no valve lesions     Allergies  Allergen Reactions  . Flagyl [Metronidazole] Itching  . Ciprofloxacin Itching and Nausea And Vomiting  . Doxycycline Itching and Other (See Comments)    Redness of skin, burning sensation  . Morphine And Related Itching    Current Outpatient Prescriptions  Medication Sig Dispense Refill  . acetaminophen (TYLENOL) 325 MG tablet Take 650 mg by mouth every 6 (six) hours as needed for mild pain.    Marland Kitchen acidophilus (RISAQUAD) CAPS capsule Take 2 capsules by mouth daily.    Marland Kitchen aspirin EC 81 MG EC tablet Take 1 tablet (81 mg total) by mouth daily.    . Coenzyme Q10 (COQ10) 100 MG CAPS Take 100 mg by mouth daily. GRADUALLY INCREASE TO 300 MG DAILY 90 each 11  . diphenoxylate-atropine (LOMOTIL) 2.5-0.025 MG per tablet Take 2 tablets by mouth 2 (two) times daily. Twice daily and as needed for diarrhea    . lisinopril (PRINIVIL,ZESTRIL) 2.5 MG tablet Take 1 tablet (2.5 mg total) by mouth daily. 30 tablet 11  . metoprolol tartrate (LOPRESSOR) 25 MG tablet Take 1 tablet (25 mg total) by mouth 2 (two) times daily. 60 tablet 11  . nitroGLYCERIN (NITROSTAT) 0.4 MG SL tablet Place 1 tablet (0.4 mg total) under the tongue every 5 (five) minutes x 3 doses as needed for chest pain. 25 tablet 12  . omega-3 acid ethyl esters (LOVAZA) 1 G  capsule Take 2 capsules (2 g total) by mouth 2 (two) times daily. 120 capsule 6  . OVER THE COUNTER MEDICATION Patient takes 2 by mouth daily - Vita - Sprout Capsules - Multi Vitamin    . rifaximin (XIFAXAN) 550 MG TABS tablet Take 550 mg by mouth 2 (two) times daily.    . rosuvastatin (CRESTOR) 40 MG tablet Take 1/2 tablet by mouth on Monday, Wednesday and Friday 30 tablet 11  . ticagrelor (BRILINTA) 90 MG TABS tablet Take 1 tablet (90 mg total) by mouth 2 (two) times daily. 60 tablet 11   No current facility-administered medications for this visit.   Social History   Social History  . Marital Status: Married    Spouse Name: N/A  . Number of Children: N/A  . Years of Education: N/A   Social History Main Topics  . Smoking status: Former Smoker    Types: Cigarettes  Quit date: 12/22/2001  . Smokeless tobacco: Never Used  . Alcohol Use: No  . Drug Use: No  . Sexual Activity: Not Asked   Other Topics Concern  . None   Social History Narrative     family history includes Cancer (age of onset: 14) in his father; Coronary artery disease in his mother; Diabetes in his brother and mother.  ROS: A comprehensive Review of Systems - was perfomed Review of Systems  Constitutional: Negative for malaise/fatigue.  HENT: Negative for nosebleeds.   Respiratory: Negative for cough and wheezing.   Cardiovascular: Negative for claudication.  Gastrointestinal: Negative for blood in stool and melena.  Genitourinary: Negative for hematuria.  Musculoskeletal:       Right wrist pain as noted  Endo/Heme/Allergies: Does not bruise/bleed easily.  All other systems reviewed and are negative.  PHYSICAL EXAM BP 116/74 mmHg  Pulse 67  Ht 6\' 6"  (1.981 m)  Wt 237 lb 11.2 oz (107.82 kg)  BMI 27.47 kg/m2 General appearance: alert, cooperative, appears stated age, no distress and well-nourished and well-groomed. Neck: no adenopathy, no carotid bruit and no JVD Lungs: clear to auscultation  bilaterally, normal percussion bilaterally and non-labored Heart: regular rate and rhythm, S1, S2 normal, no murmur, click, rub or gallop; nondisplaced PMI Abdomen: soft, non-tender; bowel sounds normal; no masses,  no organomegaly; no HJR Extremities: extremities normal, atraumatic, no cyanosis, or edema; right wrist is in a brace. He has full range of motion and normal Allen's test. Pulses: 2+ and symmetric Skin: mobility and turgor normal  Neurologic: Mental status: Alert, oriented, thought content appropriate Cranial nerves: normal (II-XII grossly intact)    Adult ECG Report Not checked  Recent Labs:  Lab Results  Component Value Date   CHOL 144 09/17/2014   HDL 36* 09/17/2014   LDLCALC 84 09/17/2014   TRIG 119 09/17/2014   CHOLHDL 4.0 09/17/2014    ASSESSMENT / PLAN: Overall doing well following his MI and PCI. Orthostatic dizziness improved. The risk discomfort is still causing an issue.  Problem List Items Addressed This Visit    NSTEMI (non-ST elevated myocardial infarction) - Primary (Chronic)    No further incidences a anginal pain. Normal wall motion on echo. He is enjoying phase II cardiac rehabilitation.      Hypertriglyceridemia (Chronic)   Hyperlipidemia with target LDL less than 70 (Chronic)    Repeat triglycerides are much better. Tolerating Crestor. Recommended using CoQ10 to help with side effects. He is not having leg cramps of the head with the Lipitor. He is taking Lovaza now along with Crestor. Due to have labs rechecked in about a month. We will see how he is doing on the new changes.      Essential hypertension (Chronic)    Blood pressure is actually pretty well controlled on low doses medications. Less orthostatic with the reduced dose of lisinopril. We talked about the importance of staying adequately hydrated, and that he may stop or hold lisinopril if he is feeling especially dizzy      CAD S/P DES PCI-circumflex (Chronic)    Only mild residual  disease noted. He had a 50% stenosis and the jailed OM branch a stent. Otherwise no lesions suspect angina. He is on aspirin plus her foot. Crestor. He is taking low-dose metoprolol and even lower dose of lisinopril. As he has no active symptoms, no change dimension.         START TAKING CoQ10 100 mg by mouth daily . Increase by 100 mg  every 3 weeks until you are taking 300 mg daily.  No change any other medications   Your physician wants you to follow-up in FEB-March 2016.   Meds ordered this encounter  Medications  . Coenzyme Q10 (COQ10) 100 MG CAPS    Sig: Take 100 mg by mouth daily. GRADUALLY INCREASE TO 300 MG DAILY    Dispense:  90 each    Refill:  11    Followup: ~5 months  DAVID W. Ellyn Hack, M.D., M.S. Interventional Cardiolgy [facility]

## 2014-11-17 ENCOUNTER — Ambulatory Visit (INDEPENDENT_AMBULATORY_CARE_PROVIDER_SITE_OTHER): Payer: Managed Care, Other (non HMO) | Admitting: Cardiology

## 2014-11-17 ENCOUNTER — Encounter (HOSPITAL_COMMUNITY)
Admission: RE | Admit: 2014-11-17 | Discharge: 2014-11-17 | Disposition: A | Discharge status: Home / Self Care | Payer: Managed Care, Other (non HMO) | Source: Ambulatory Visit | Attending: Cardiology | Admitting: Cardiology

## 2014-11-17 VITALS — BP 116/74 | HR 67 | Ht 78.0 in | Wt 237.7 lb

## 2014-11-17 DIAGNOSIS — I2510 Atherosclerotic heart disease of native coronary artery without angina pectoris: Secondary | ICD-10-CM | POA: Diagnosis not present

## 2014-11-17 DIAGNOSIS — I214 Non-ST elevation (NSTEMI) myocardial infarction: Secondary | ICD-10-CM | POA: Diagnosis not present

## 2014-11-17 DIAGNOSIS — Z9861 Coronary angioplasty status: Secondary | ICD-10-CM

## 2014-11-17 DIAGNOSIS — I213 ST elevation (STEMI) myocardial infarction of unspecified site: Secondary | ICD-10-CM | POA: Diagnosis not present

## 2014-11-17 DIAGNOSIS — I10 Essential (primary) hypertension: Secondary | ICD-10-CM | POA: Diagnosis not present

## 2014-11-17 DIAGNOSIS — E781 Pure hyperglyceridemia: Secondary | ICD-10-CM | POA: Diagnosis not present

## 2014-11-17 DIAGNOSIS — E785 Hyperlipidemia, unspecified: Secondary | ICD-10-CM

## 2014-11-17 MED ORDER — COQ10 100 MG PO CAPS: 100.0000 mg | ORAL_CAPSULE | Freq: Every day | ORAL | Status: AC

## 2014-11-17 NOTE — Patient Instructions (Addendum)
START TAKING CoQ10 100 mg by mouth daily . Increase by 100 mg every 3 weeks until you are taking 300 mg daily.  No change any other medications   Your physician wants you to follow-up in FEB-March 2016. You will receive a reminder letter in the mail two months in advance. If you don't receive a letter, please call our office to schedule the follow-up appointment.

## 2014-11-19 ENCOUNTER — Encounter (HOSPITAL_COMMUNITY)
Admission: RE | Admit: 2014-11-19 | Discharge: 2014-11-19 | Disposition: A | Discharge status: Home / Self Care | Payer: Managed Care, Other (non HMO) | Source: Ambulatory Visit | Attending: Cardiology | Admitting: Cardiology

## 2014-11-19 DIAGNOSIS — I213 ST elevation (STEMI) myocardial infarction of unspecified site: Secondary | ICD-10-CM | POA: Diagnosis not present

## 2014-11-19 NOTE — Assessment & Plan Note (Signed)
No further incidences a anginal pain. Normal wall motion on echo. He is enjoying phase II cardiac rehabilitation.

## 2014-11-19 NOTE — Assessment & Plan Note (Signed)
Blood pressure is actually pretty well controlled on low doses medications. Less orthostatic with the reduced dose of lisinopril. We talked about the importance of staying adequately hydrated, and that he may stop or hold lisinopril if he is feeling especially dizzy

## 2014-11-19 NOTE — Assessment & Plan Note (Signed)
Only mild residual disease noted. He had a 50% stenosis and the jailed OM branch a stent. Otherwise no lesions suspect angina. He is on aspirin plus her foot. Crestor. He is taking low-dose metoprolol and even lower dose of lisinopril. As he has no active symptoms, no change dimension.

## 2014-11-19 NOTE — Assessment & Plan Note (Signed)
Repeat triglycerides are much better. Tolerating Crestor. Recommended using CoQ10 to help with side effects. He is not having leg cramps of the head with the Lipitor. He is taking Lovaza now along with Crestor. Due to have labs rechecked in about a month. We will see how he is doing on the new changes.

## 2014-11-26 NOTE — Progress Notes (Signed)
Cardiac Rehabilitation Program Outcomes Report   Orientation:  08/10/14 Graduate Date:  11/19/14 Discharge Date:  11/19/14 # of sessions completed: 36  Cardiologist: Benita Stabile MD:  Willis Modena Time:  1100  A.  Exercise Program:  Tolerates exercise @ 4.20 METS for 15 minutes, Walk Test Results:  Post: 6.61 mets, Improved functional capacity  1931 %, Improved  muscular strength  7.43 % and Improved  flexibility 11.46 %  B.  Mental Health:  Good mental attitude and PHQ-9: 0.  PHQ9 score 0 at entrance and discharge.   C.  Education/Instruction/Skills  Accurately checks own pulse.  Rest:  91   Exercise:  110, Knows THR for exercise, Uses Perceived Exertion Scale and/or Dyspnea Scale and Attended 13 education classes  Home exercise given: 11/19/14  D.  Nutrition/Weight Control/Body Composition:  Adherence to prescribed nutrition program: good  and Patient has lost .6 kg   E.  Blood Lipids    Lab Results  Component Value Date   CHOL 144 09/17/2014   HDL 36* 09/17/2014   LDLCALC 84 09/17/2014   TRIG 119 09/17/2014   CHOLHDL 4.0 09/17/2014    F.  Lifestyle Changes:  Making positive lifestyle changes  G.  Symptoms noted with exercise:  Asymptomatic  Report Completed By:  Stevphen Rochester RN   Comments:  This is the patients graduation note for [facility].  Patient has done very well in the program and plans to join [facility].

## 2014-11-26 NOTE — Progress Notes (Signed)
Patient is discharged from [facility] program today, November 19, 2014  with 36 sessions.  He achieved LTG of 30 minutes of aerobic exercise at max met level of 4.20.  All patient vitals are WNL.  Patient has not met with dietician.  Discharge instructions have been reviewed in detail and patient expressed an understanding of material given.  Patient plans to exercise at home and join the [facility]. Cardiac Rehab will make 1 month, 6 month and 1 year call backs.  Patient had no complaints of any abnormal S/S or pain on their exit visit.  Patient finished post walk test.

## 2015-01-07 ENCOUNTER — Other Ambulatory Visit (INDEPENDENT_AMBULATORY_CARE_PROVIDER_SITE_OTHER): Payer: Managed Care, Other (non HMO)

## 2015-01-07 DIAGNOSIS — E785 Hyperlipidemia, unspecified: Secondary | ICD-10-CM | POA: Diagnosis not present

## 2015-01-07 LAB — LIPID PANEL
Cholesterol: 171 mg/dL (ref 125–200)
HDL: 43 mg/dL (ref >=40)
LDL CALC: 105 mg/dL (ref <130)
Total CHOL/HDL Ratio: 4 Ratio (ref <=5.0)
Triglycerides: 115 mg/dL (ref <150)
VLDL: 23 mg/dL (ref <30)

## 2015-01-07 LAB — HEPATIC FUNCTION PANEL
ALK PHOS: 64 U/L (ref 40–115)
ALT: 22 U/L (ref 9–46)
AST: 18 U/L (ref 10–35)
Albumin: 4.8 g/dL (ref 3.6–5.1)
BILIRUBIN INDIRECT: 0.6 mg/dL (ref 0.2–1.2)
BILIRUBIN TOTAL: 0.7 mg/dL (ref 0.2–1.2)
Bilirubin, Direct: 0.1 mg/dL (ref <=0.2)
Total Protein: 7.2 g/dL (ref 6.1–8.1)

## 2015-01-07 NOTE — Addendum Note (Signed)
Addended by: Eulis Foster on: 01/07/2015 08:22 AM   Modules accepted: Orders

## 2015-01-11 ENCOUNTER — Telehealth: Payer: Self-pay | Admitting: *Deleted

## 2015-01-11 NOTE — Telephone Encounter (Signed)
-----   Message from Leonie Man, MD sent at 01/10/2015  5:52 PM EST ----- Overall, cholesterol is not at goal. Lets try to increase Crestor to every day dosing. LFTs OK  HARDING, Leonie Green, MD

## 2015-01-11 NOTE — Telephone Encounter (Signed)
Spoke to wife Result given verbalized understanding.  wife states patient is still having cramps taking Crestor M-W F  Will defer to  Dr Oval Linsey and contact her

## 2015-01-11 NOTE — Telephone Encounter (Signed)
Can we do Zetia 10 mg then?  Calvin Man, MD

## 2015-01-12 NOTE — Telephone Encounter (Signed)
Left message to call back  

## 2015-01-20 MED ORDER — EZETIMIBE 10 MG PO TABS: 10.0000 mg | ORAL_TABLET | Freq: Every day | ORAL | Status: DC

## 2015-01-20 NOTE — Telephone Encounter (Signed)
LEFT MESSAGE TO CALL BACK

## 2015-01-20 NOTE — Telephone Encounter (Signed)
Spoke to patient's wife. Verbalized understanding  start ZETIA 10 MG  QD-- # 30 X 11 Wife states patient did stop taking crestor for a few weeks and cramps still continued,so patient restarted medications E-sent to pharmacy

## 2015-04-19 ENCOUNTER — Encounter (HOSPITAL_COMMUNITY): Payer: Self-pay

## 2015-04-19 ENCOUNTER — Emergency Department (HOSPITAL_COMMUNITY)
Admission: EM | Admit: 2015-04-19 | Discharge: 2015-04-19 | Disposition: A | Discharge status: Home / Self Care | Payer: 59 | Source: Home / Self Care | Attending: Emergency Medicine | Admitting: Emergency Medicine

## 2015-04-19 ENCOUNTER — Inpatient Hospital Stay (HOSPITAL_COMMUNITY)
Admission: EM | Admit: 2015-04-19 | Discharge: 2015-04-25 | DRG: 392 | Disposition: A | Discharge status: Home / Self Care | Payer: 59 | Attending: Internal Medicine | Admitting: Internal Medicine

## 2015-04-19 ENCOUNTER — Emergency Department (HOSPITAL_COMMUNITY): Payer: 59

## 2015-04-19 DIAGNOSIS — E119 Type 2 diabetes mellitus without complications: Secondary | ICD-10-CM | POA: Diagnosis present

## 2015-04-19 DIAGNOSIS — K567 Ileus, unspecified: Secondary | ICD-10-CM | POA: Diagnosis present

## 2015-04-19 DIAGNOSIS — M109 Gout, unspecified: Secondary | ICD-10-CM | POA: Diagnosis present

## 2015-04-19 DIAGNOSIS — Z79899 Other long term (current) drug therapy: Secondary | ICD-10-CM

## 2015-04-19 DIAGNOSIS — K5190 Ulcerative colitis, unspecified, without complications: Secondary | ICD-10-CM | POA: Insufficient documentation

## 2015-04-19 DIAGNOSIS — Z8249 Family history of ischemic heart disease and other diseases of the circulatory system: Secondary | ICD-10-CM

## 2015-04-19 DIAGNOSIS — Z7982 Long term (current) use of aspirin: Secondary | ICD-10-CM | POA: Insufficient documentation

## 2015-04-19 DIAGNOSIS — E785 Hyperlipidemia, unspecified: Secondary | ICD-10-CM

## 2015-04-19 DIAGNOSIS — Z87891 Personal history of nicotine dependence: Secondary | ICD-10-CM

## 2015-04-19 DIAGNOSIS — I252 Old myocardial infarction: Secondary | ICD-10-CM

## 2015-04-19 DIAGNOSIS — I2510 Atherosclerotic heart disease of native coronary artery without angina pectoris: Secondary | ICD-10-CM

## 2015-04-19 DIAGNOSIS — R197 Diarrhea, unspecified: Principal | ICD-10-CM

## 2015-04-19 DIAGNOSIS — Z955 Presence of coronary angioplasty implant and graft: Secondary | ICD-10-CM

## 2015-04-19 DIAGNOSIS — K529 Noninfective gastroenteritis and colitis, unspecified: Secondary | ICD-10-CM

## 2015-04-19 DIAGNOSIS — R0981 Nasal congestion: Secondary | ICD-10-CM | POA: Insufficient documentation

## 2015-04-19 DIAGNOSIS — E860 Dehydration: Secondary | ICD-10-CM

## 2015-04-19 DIAGNOSIS — I10 Essential (primary) hypertension: Secondary | ICD-10-CM | POA: Diagnosis present

## 2015-04-19 DIAGNOSIS — Z9049 Acquired absence of other specified parts of digestive tract: Secondary | ICD-10-CM

## 2015-04-19 DIAGNOSIS — R0789 Other chest pain: Secondary | ICD-10-CM

## 2015-04-19 DIAGNOSIS — Z9861 Coronary angioplasty status: Secondary | ICD-10-CM

## 2015-04-19 DIAGNOSIS — Z809 Family history of malignant neoplasm, unspecified: Secondary | ICD-10-CM

## 2015-04-19 DIAGNOSIS — E781 Pure hyperglyceridemia: Secondary | ICD-10-CM | POA: Diagnosis present

## 2015-04-19 DIAGNOSIS — K91850 Pouchitis: Secondary | ICD-10-CM | POA: Diagnosis present

## 2015-04-19 DIAGNOSIS — Z881 Allergy status to other antibiotic agents status: Secondary | ICD-10-CM

## 2015-04-19 DIAGNOSIS — K56609 Unspecified intestinal obstruction, unspecified as to partial versus complete obstruction: Secondary | ICD-10-CM

## 2015-04-19 DIAGNOSIS — A084 Viral intestinal infection, unspecified: Secondary | ICD-10-CM | POA: Diagnosis not present

## 2015-04-19 DIAGNOSIS — R112 Nausea with vomiting, unspecified: Secondary | ICD-10-CM

## 2015-04-19 DIAGNOSIS — Z833 Family history of diabetes mellitus: Secondary | ICD-10-CM

## 2015-04-19 DIAGNOSIS — R109 Unspecified abdominal pain: Secondary | ICD-10-CM

## 2015-04-19 DIAGNOSIS — R05 Cough: Secondary | ICD-10-CM

## 2015-04-19 DIAGNOSIS — K5660 Unspecified intestinal obstruction: Secondary | ICD-10-CM | POA: Diagnosis present

## 2015-04-19 LAB — COMPREHENSIVE METABOLIC PANEL
ALBUMIN: 4.7 g/dL (ref 3.5–5.0)
ALK PHOS: 69 U/L (ref 38–126)
ALT: 20 U/L (ref 17–63)
AST: 19 U/L (ref 15–41)
Anion gap: 12 (ref 5–15)
BUN: 15 mg/dL (ref 6–20)
CALCIUM: 9.7 mg/dL (ref 8.9–10.3)
CO2: 23 mmol/L (ref 22–32)
Chloride: 103 mmol/L (ref 101–111)
Creatinine, Ser: 0.64 mg/dL (ref 0.61–1.24)
GFR calc Af Amer: >60 mL/min (ref >60)
GFR calc non Af Amer: >60 mL/min (ref >60)
GLUCOSE: 195 mg/dL — AB (ref 65–99)
Potassium: 4 mmol/L (ref 3.5–5.1)
Sodium: 138 mmol/L (ref 135–145)
Total Bilirubin: 0.9 mg/dL (ref 0.3–1.2)
Total Protein: 8.2 g/dL — ABNORMAL HIGH (ref 6.5–8.1)

## 2015-04-19 LAB — LIPASE, BLOOD: LIPASE: 21 U/L (ref 11–51)

## 2015-04-19 LAB — CBC WITH DIFFERENTIAL/PLATELET
BASOS ABS: 0.1 10*3/uL (ref 0.0–0.1)
BASOS PCT: 0 %
EOS ABS: 0.1 10*3/uL (ref 0.0–0.7)
Eosinophils Relative: 1 %
HCT: 47.6 % (ref 39.0–52.0)
HEMOGLOBIN: 17.1 g/dL — AB (ref 13.0–17.0)
Lymphocytes Relative: 24 %
Lymphs Abs: 3.4 10*3/uL (ref 0.7–4.0)
MCH: 32 pg (ref 26.0–34.0)
MCHC: 35.9 g/dL (ref 30.0–36.0)
MCV: 89 fL (ref 78.0–100.0)
Monocytes Absolute: 1 10*3/uL (ref 0.1–1.0)
Monocytes Relative: 7 %
NEUTROS PCT: 68 %
Neutro Abs: 9.7 10*3/uL — ABNORMAL HIGH (ref 1.7–7.7)
Platelets: 253 10*3/uL (ref 150–400)
RBC: 5.35 MIL/uL (ref 4.22–5.81)
RDW: 12.5 % (ref 11.5–15.5)
WBC: 14.1 10*3/uL — ABNORMAL HIGH (ref 4.0–10.5)

## 2015-04-19 LAB — LACTIC ACID, PLASMA: LACTIC ACID, VENOUS: 1.5 mmol/L (ref 0.5–2.0)

## 2015-04-19 LAB — TROPONIN I: Troponin I: <0.03 ng/mL (ref <0.031)

## 2015-04-19 MED ORDER — ACETAMINOPHEN 500 MG PO TABS
1000.0000 mg | ORAL_TABLET | Freq: Once | ORAL | Status: AC
  Administered 2015-04-19: 1000 mg via ORAL
  Filled 2015-04-19: qty 2

## 2015-04-19 MED ORDER — HYDROMORPHONE HCL 1 MG/ML IJ SOLN
0.5000 mg | Freq: Once | INTRAMUSCULAR | Status: AC
  Administered 2015-04-19: 0.5 mg via INTRAVENOUS
  Filled 2015-04-19: qty 1

## 2015-04-19 MED ORDER — ONDANSETRON HCL 4 MG/2ML IJ SOLN
4.0000 mg | Freq: Once | INTRAMUSCULAR | Status: AC
  Administered 2015-04-19: 4 mg via INTRAVENOUS
  Filled 2015-04-19: qty 2

## 2015-04-19 MED ORDER — HYDROMORPHONE HCL 1 MG/ML IJ SOLN
1.0000 mg | Freq: Once | INTRAMUSCULAR | Status: AC
  Administered 2015-04-19: 1 mg via INTRAVENOUS
  Filled 2015-04-19: qty 1

## 2015-04-19 MED ORDER — SODIUM CHLORIDE 0.9 % IV SOLN
1000.0000 mL | Freq: Once | INTRAVENOUS | Status: AC
  Administered 2015-04-19: 1000 mL via INTRAVENOUS

## 2015-04-19 MED ORDER — SODIUM CHLORIDE 0.9 % IV BOLUS (SEPSIS): 1000.0000 mL | Freq: Once | INTRAVENOUS | Status: DC

## 2015-04-19 MED ORDER — ONDANSETRON HCL 4 MG/2ML IJ SOLN
INTRAMUSCULAR | Status: AC
  Filled 2015-04-19: qty 2

## 2015-04-19 MED ORDER — SODIUM CHLORIDE 0.9 % IV BOLUS (SEPSIS)
1000.0000 mL | Freq: Once | INTRAVENOUS | Status: AC
  Administered 2015-04-19: 1000 mL via INTRAVENOUS

## 2015-04-19 MED ORDER — DIATRIZOATE MEGLUMINE & SODIUM 66-10 % PO SOLN
ORAL | Status: AC
  Filled 2015-04-19: qty 30

## 2015-04-19 MED ORDER — HYDROMORPHONE HCL 4 MG PO TABS: 4.0000 mg | ORAL_TABLET | Freq: Four times a day (QID) | ORAL | Status: DC | PRN

## 2015-04-19 MED ORDER — ONDANSETRON HCL 4 MG/2ML IJ SOLN
4.0000 mg | Freq: Once | INTRAMUSCULAR | Status: AC
  Administered 2015-04-19: 4 mg via INTRAVENOUS

## 2015-04-19 MED ORDER — IOHEXOL 300 MG/ML  SOLN
100.0000 mL | Freq: Once | INTRAMUSCULAR | Status: AC | PRN
  Administered 2015-04-19: 100 mL via INTRAVENOUS

## 2015-04-19 MED ORDER — ONDANSETRON 4 MG PO TBDP: ORAL_TABLET | ORAL | Status: DC

## 2015-04-19 NOTE — ED Notes (Signed)
Pt received prescription for dilaudid.

## 2015-04-19 NOTE — ED Provider Notes (Signed)
CSN: [id number]     Arrival date & time 04/19/15  2253 History  By signing my name below, I, Helane Gunther, attest that this documentation has been prepared under the direction and in the presence of Merryl Hacker, MD. Electronically Signed: Helane Gunther, ED Scribe. 04/20/2015. 12:23 AM.    Chief Complaint  Patient presents with  . Emesis   The history is provided by the patient. No language interpreter was used.   HPI Comments: Calvin Cruz is a 55 y.o. male former smoker with a PMHx of CAD, Non-STEMI, HLD, hypertriglyceridemia, and ulcerative colitis as well as a PSHx of colectomy who presents to the Emergency Department complaining of emesis onset 1 day ago. He reports associated diarrhea onset 2 days ago. Pt states he was seen for the same in the ED earlier today and sent home after all test results were reviewed. Per wife, pt was sent home with dilaudid and Zofran which he has been taking without relief. She states that pt has not been able to take his heart medications due to the vomiting, and she is concerned he may need to be admitted. Pt denies fever, CP, hematemesis, and bloody stool.  Past Medical History  Diagnosis Date  . Ulcerative colitis   . Gout 2014  . Fatigue 11/28/2012  . Erythrocytosis 11/28/2012  . Non-STEMI (non-ST elevated myocardial infarction) ([place]) 07/22/2014    a) RCA: OK, LM: Okay ; LAD: Proximal to mid 40%; RI: 30%;LCx: Mid to distal 90%, distal 20%, OM1 50% --> DES PCI  . CAD S/P DES PCI-circumflex 07/22/2014    PCI mCx: PCI: 2.75 x 18 mm Xience Alpine DES to mid LCx at takeoff of OM1 (OM1 jailed 50% stenosis)  . Essential hypertension   . Hyperlipidemia with target LDL less than 70   . Hypertriglyceridemia 11/16/2014   Past Surgical History  Procedure Laterality Date  . Colectomy    . Hernia repair    . Cardiac catheterization N/A 07/23/2014    Procedure: Left Heart Cath and Coronary Angiography;  Surgeon: Leonie Man, MD;  Location: [facility];  Service: Cardiovascular;  RCA: Mild disease; LM: Okay ; LAD: Proximal to mid 40%; RI: 30%;LCx: Mid to distal 90%, distal 20%, OM1 50%; Mid inferolateral HK, EF 55-65%   . Cardiac catheterization N/A 07/23/2014    Procedure: Coronary Stent Intervention;  Surgeon: Leonie Man, MD;  Location: [facility];  Service: Cardiovascular; PCI: 2.75 x 18 mm Xience Alpine DES to mid LCx at takeoff of OM1 (OM1 jailed 50% stenosis)  . Transthoracic echocardiogram  07/22/2014    EF 55-60%, normal wall motion, normal diastolic function,  no valve lesions    Family History  Problem Relation Age of Onset  . Diabetes Mother   . Coronary artery disease Mother   . Cancer Father 29    lung  . Diabetes Brother    Social History  Substance Use Topics  . Smoking status: Former Smoker    Types: Cigarettes    Quit date: 12/22/2001  . Smokeless tobacco: Never Used  . Alcohol Use: No    Review of Systems  Constitutional: Negative for fever.  Cardiovascular: Negative for chest pain.  Gastrointestinal: Positive for nausea, vomiting and diarrhea. Negative for blood in stool.  All other systems reviewed and are negative.   Allergies  Flagyl; Ciprofloxacin; Doxycycline; and Morphine and related  Home Medications   Prior to Admission medications   Medication Sig Start Date End  Date Taking? Authorizing Provider  acetaminophen (TYLENOL) 325 MG tablet Take 650 mg by mouth every 6 (six) hours as needed for mild pain.    Historical Provider, MD  acidophilus (RISAQUAD) CAPS capsule Take 2 capsules by mouth daily.    Historical Provider, MD  aspirin EC 81 MG EC tablet Take 1 tablet (81 mg total) by mouth daily. 07/24/14   Brett Canales, PA-C  Coenzyme Q10 (COQ10) 100 MG CAPS Take 100 mg by mouth daily. GRADUALLY INCREASE TO 300 MG DAILY 11/17/14   Leonie Man, MD  diphenoxylate-atropine (LOMOTIL) 2.5-0.025 MG per tablet Take 2 tablets by mouth 2 (two) times daily. Twice daily and as needed  for diarrhea    Historical Provider, MD  ezetimibe (ZETIA) 10 MG tablet Take 1 tablet (10 mg total) by mouth daily. 01/20/15   Leonie Man, MD  HYDROmorphone (DILAUDID) 4 MG tablet Take 1 tablet (4 mg total) by mouth every 6 (six) hours as needed for severe pain. 04/19/15   Fredia Sorrow, MD  lisinopril (PRINIVIL,ZESTRIL) 2.5 MG tablet Take 1 tablet (2.5 mg total) by mouth daily. 09/01/14   Liliane Shi, PA-C  metoprolol tartrate (LOPRESSOR) 25 MG tablet Take 1 tablet (25 mg total) by mouth 2 (two) times daily. 07/24/14   Brett Canales, PA-C  nitroGLYCERIN (NITROSTAT) 0.4 MG SL tablet Place 1 tablet (0.4 mg total) under the tongue every 5 (five) minutes x 3 doses as needed for chest pain. 07/24/14   Brett Canales, PA-C  omega-3 acid ethyl esters (LOVAZA) 1 G capsule Take 2 capsules (2 g total) by mouth 2 (two) times daily. 10/15/14   Leonie Man, MD  ondansetron (ZOFRAN ODT) 4 MG disintegrating tablet 4mg  ODT q4 hours prn nausea/vomit 04/19/15   [name], MD  OVER THE COUNTER MEDICATION Patient takes 2 by mouth daily - Vita - Sprout Capsules - Multi Vitamin    Historical Provider, MD  rifaximin (XIFAXAN) 550 MG TABS tablet Take 550 mg by mouth 2 (two) times daily.    Historical Provider, MD  rosuvastatin (CRESTOR) 40 MG tablet Take 1/2 tablet by mouth on Monday, Wednesday and Friday 10/15/14   Leonie Man, MD  ticagrelor (BRILINTA) 90 MG TABS tablet Take 1 tablet (90 mg total) by mouth 2 (two) times daily. 07/24/14   Brett Canales, PA-C   BP 135/73 mmHg  Pulse 118  Temp(Src) 97.9 F (36.6 C) (Oral)  Resp 20  Ht 6\' 6"  (1.981 m)  Wt 239 lb (108.41 kg)  BMI 27.62 kg/m2  SpO2 100% Physical Exam  Constitutional: He is oriented to person, place, and time.  Uncomfortable appearing but nontoxic  HENT:  Head: Normocephalic and atraumatic.  Mucous membranes dry  Cardiovascular: Normal rate, regular rhythm and normal heart sounds.   No murmur heard. Pulmonary/Chest: Effort normal and  breath sounds normal. No respiratory distress. He has no wheezes.  Abdominal: Soft. Bowel sounds are normal. There is tenderness. There is no rebound and no guarding.  Diffuse tenderness to palpation without rebound or guarding  Musculoskeletal: He exhibits no edema.  Neurological: He is alert and oriented to person, place, and time.  Skin: Skin is warm and dry.  Psychiatric: He has a normal mood and affect.  Nursing note and vitals reviewed.   ED Course  Procedures  DIAGNOSTIC STUDIES: Oxygen Saturation is 100% on RA, normal by my interpretation.    COORDINATION OF CARE: 12:04 AM - Discussed plans to wait on diagnostic studies  and order IV fluids. Pt advised of plan for treatment and pt agrees.  Labs Review Labs Reviewed  COMPREHENSIVE METABOLIC PANEL - Abnormal; Notable for the following:    CO2 21 (*)    Glucose, Bld 216 (*)    Total Bilirubin 1.7 (*)    All other components within normal limits  CBC WITH DIFFERENTIAL/PLATELET    Imaging Review Dg Chest 2 View  04/19/2015  CLINICAL DATA:  Chest pain and cough beginning yesterday. Chest pressure when coughing. EXAM: CHEST  2 VIEW COMPARISON:  Single view of the chest 07/22/2014. FINDINGS: The lungs are clear. Heart size is normal. No pneumothorax or pleural effusion. Azygos fissure is noted. Thoracic spondylosis also seen. IMPRESSION: No acute disease. Electronically Signed   By: Inge Rise M.D.   On: 04/19/2015 11:00   Ct Abdomen Pelvis W Contrast  04/19/2015  CLINICAL DATA:  Low pelvic pain for 1.5 days. EXAM: CT ABDOMEN AND PELVIS WITH CONTRAST TECHNIQUE: Multidetector CT imaging of the abdomen and pelvis was performed using the standard protocol following bolus administration of intravenous contrast. CONTRAST:  126mL OMNIPAQUE IOHEXOL 300 MG/ML  SOLN COMPARISON:  03/21/07 FINDINGS: Lower chest: There is no pleural effusion identified the lung bases are clear. Hepatobiliary: There is no focal liver abnormality. Previous  cholecystectomy. Pancreas: Normal appearance of the pancreas. Spleen: Negative Adrenals/Urinary Tract: The adrenal glands are normal. Unremarkable appearance of the kidneys. The urinary bladder appears within normal limits. Stomach/Bowel: The stomach appears normal the patient is status post total collect. A Hartman's pouch is identified within the pelvis. The small bowel loops are mildly increased in caliber measuring up to 3.5 cm, image number 49 of series 4. Mild small bowel wall enhancement with surrounding inflammation. There is a small amount of free fluid within the white lower quadrant of the abdomen surrounding the small bowel loops, for example image 34 series 4. No abscess identified. Vascular/Lymphatic: Calcified atherosclerotic disease involves the abdominal aorta. No aneurysm. No enlarged retroperitoneal or mesenteric adenopathy. No enlarged pelvic or inguinal lymph nodes. Reproductive: Prostate gland and seminal vesicles are unremarkable. Other: There is no ascites or focal fluid collections within the abdomen or pelvis. Musculoskeletal: The visualized bony structures are remarkable for degenerative disc disease within the lower thoracic spine. IMPRESSION: 1. Postoperative changes of total colectomy and Hartman's pouch formation. 2. There are mild inflammatory changes involving the mildly dilated right lower quadrant small bowel loops with surrounding fat stranding and free fluid. Findings compatible with acute enteritis. No evidence for perforation or abscess formation. No high-grade obstruction 3. Aortic atherosclerosis Electronically Signed   By: Kerby Moors M.D.   On: 04/19/2015 14:32   I have personally reviewed and evaluated these images and lab results as part of my medical decision-making.   EKG Interpretation None      MDM   Final diagnoses:  Enterocolitis   Patient presents with persistent vomiting and diarrhea. CT scan earlier today showed enterocolitis. History of  ulcerative colitis status post colectomy. No fevers. Vital signs notable for heart rate of 118. He received 3 L of fluid earlier today. Patient was placed on maintenance fluid and given pain and nausea medication. Repeat lab work is stable.  Given inability to tolerate by mouth at home, will admit for hydration and further management. Discussed with the hospitalist. Antibiotics. However, patient is allergic to Cipro and Flagyl. Will defer to the hospitalist. He is a patient of Dr. Laural Golden, GI.  I personally performed the services described in this documentation,  which was scribed in my presence. The recorded information has been reviewed and is accurate.   Merryl Hacker, MD 04/20/15 [phone]

## 2015-04-19 NOTE — ED Notes (Signed)
MD notified that pt was having emesis.

## 2015-04-19 NOTE — ED Notes (Signed)
Pt reports diarrhea since Saturday.  Reports stool is watery and frequent.  Pt has a j pouch.  Reports pt usually has 8-10 diarrhea stools a day.  Pt also reports nausea and dry heaves.  Pt thought had a sinus infection and was prescribed augmentin yesterday but has only taken one.  Also took xifaxin for possible pouchitis.

## 2015-04-19 NOTE — ED Notes (Signed)
Pt ambulated to bathroom to have bowel movement.

## 2015-04-19 NOTE — ED Notes (Addendum)
Patient was seen in ED today for vomiting and diarrhea and unable to keep anything down or take medications. Family states they spoke to GI on call and was told to come back to ED for possibl admission. Patient currently vomiting in triage.

## 2015-04-19 NOTE — ED Provider Notes (Signed)
Following the additional liter of fluid patient feels well enough to go home. Patient request a mild pain medicine. Patient tolerated hydromorphone fine here will be given prescription for oral hydromorphone.  Fredia Sorrow, MD 04/19/15 [phone]

## 2015-04-19 NOTE — Discharge Instructions (Signed)
If you were given medicines take as directed.  If you are on coumadin or contraceptives realize their levels and effectiveness is altered by many different medicines.  If you have any reaction (rash, tongues swelling, other) to the medicines stop taking and see a physician.    If your blood pressure was elevated in the ER make sure you follow up for management with a primary doctor or return for chest pain, shortness of breath or stroke symptoms.  Please follow up as directed and return to the ER or see a physician for new or worsening symptoms.  Thank you. Filed Vitals:   04/19/15 1308 04/19/15 1330 04/19/15 1400 04/19/15 1430  BP: 121/86 123/84 125/82 122/85  Pulse: 90 88 90 94  Temp:      TempSrc:      Resp: 12 14 16 26   Height:      Weight:      SpO2: 96% 94% 94% 99%

## 2015-04-19 NOTE — ED Provider Notes (Signed)
History  By signing my name below, I, Calvin Cruz, attest that this documentation has been prepared under the direction and in the presence of Calvin Morrison, MD. Electronically Signed: [name], ED Scribe. 04/19/2015. 12:32 PM  Chief Complaint  Patient presents with  . Diarrhea   The history is provided by the patient and medical records. No language interpreter was used.    HPI Comments:  Calvin Cruz is a 55 y.o. male with PMHx of ulcerative colitis with ileoanal anastemosis who presents to the Emergency Department complaining of constant low abdominal pain that began yesterday. He describes the pain as sharp. Wife reports pt normally has 8-10 loose stools daily but reports he is having severe diarrhea that she describes " is like a faucet". He reports congestion and cough with chest tightness secondary to cough. He reports dry heaving but denies vomiting. She reports sick contact from their grandchildren. He has been drinking Powerade and Gatorade but states he cannot take in enough. He denies modifying factors. He denies fevers, rash, HA, leg swelling. He states he takes an ASA daily. Wife states he was started on Augmentin yesterday for a sinus infection and has had one dose. He is also taking Xifaxin for pouchitis.  Past Medical History  Diagnosis Date  . Ulcerative colitis   . Gout 2014  . Fatigue 11/28/2012  . Erythrocytosis 11/28/2012  . Non-STEMI (non-ST elevated myocardial infarction) ([place]) 07/22/2014    a) RCA: OK, LM: Okay ; LAD: Proximal to mid 40%; RI: 30%;LCx: Mid to distal 90%, distal 20%, OM1 50% --> DES PCI  . CAD S/P DES PCI-circumflex 07/22/2014    PCI mCx: PCI: 2.75 x 18 mm Xience Alpine DES to mid LCx at takeoff of OM1 (OM1 jailed 50% stenosis)  . Essential hypertension   . Hyperlipidemia with target LDL less than 70   . Hypertriglyceridemia 11/16/2014   Past Surgical History  Procedure Laterality Date  . Colectomy    . Hernia repair    . Cardiac  catheterization N/A 07/23/2014    Procedure: Left Heart Cath and Coronary Angiography;  Surgeon: Leonie Man, MD;  Location: [facility];  Service: Cardiovascular;  RCA: Mild disease; LM: Okay ; LAD: Proximal to mid 40%; RI: 30%;LCx: Mid to distal 90%, distal 20%, OM1 50%; Mid inferolateral HK, EF 55-65%   . Cardiac catheterization N/A 07/23/2014    Procedure: Coronary Stent Intervention;  Surgeon: Leonie Man, MD;  Location: [facility];  Service: Cardiovascular; PCI: 2.75 x 18 mm Xience Alpine DES to mid LCx at takeoff of OM1 (OM1 jailed 50% stenosis)  . Transthoracic echocardiogram  07/22/2014    EF 55-60%, normal wall motion, normal diastolic function,  no valve lesions    Family History  Problem Relation Age of Onset  . Diabetes Mother   . Coronary artery disease Mother   . Cancer Father 49    lung  . Diabetes Brother    Social History  Substance Use Topics  . Smoking status: Former Smoker    Types: Cigarettes    Quit date: 12/22/2001  . Smokeless tobacco: Never Used  . Alcohol Use: No    Review of Systems A complete 10 system review of systems was obtained and all systems are negative except as noted in the HPI and PMH.   Allergies  Flagyl; Ciprofloxacin; Doxycycline; and Morphine and related  Home Medications   Prior to Admission medications   Medication Sig Start Date End Date Taking?  Authorizing Provider  acetaminophen (TYLENOL) 325 MG tablet Take 650 mg by mouth every 6 (six) hours as needed for mild pain.   Yes Historical Provider, MD  acidophilus (RISAQUAD) CAPS capsule Take 2 capsules by mouth daily.   Yes Historical Provider, MD  aspirin EC 81 MG EC tablet Take 1 tablet (81 mg total) by mouth daily. 07/24/14  Yes Brett Canales, PA-C  Coenzyme Q10 (COQ10) 100 MG CAPS Take 100 mg by mouth daily. GRADUALLY INCREASE TO 300 MG DAILY 11/17/14  Yes Leonie Man, MD  diphenoxylate-atropine (LOMOTIL) 2.5-0.025 MG per tablet Take 2 tablets by mouth 2 (two)  times daily. Twice daily and as needed for diarrhea   Yes Historical Provider, MD  ezetimibe (ZETIA) 10 MG tablet Take 1 tablet (10 mg total) by mouth daily. 01/20/15  Yes Leonie Man, MD  lisinopril (PRINIVIL,ZESTRIL) 2.5 MG tablet Take 1 tablet (2.5 mg total) by mouth daily. 09/01/14  Yes Liliane Shi, PA-C  metoprolol tartrate (LOPRESSOR) 25 MG tablet Take 1 tablet (25 mg total) by mouth 2 (two) times daily. 07/24/14  Yes Brett Canales, PA-C  nitroGLYCERIN (NITROSTAT) 0.4 MG SL tablet Place 1 tablet (0.4 mg total) under the tongue every 5 (five) minutes x 3 doses as needed for chest pain. 07/24/14  Yes Brett Canales, PA-C  omega-3 acid ethyl esters (LOVAZA) 1 G capsule Take 2 capsules (2 g total) by mouth 2 (two) times daily. 10/15/14  Yes Leonie Man, MD  OVER THE COUNTER MEDICATION Patient takes 2 by mouth daily - Vita - Sprout Capsules - Multi Vitamin   Yes Historical Provider, MD  rifaximin (XIFAXAN) 550 MG TABS tablet Take 550 mg by mouth 2 (two) times daily.   Yes Historical Provider, MD  rosuvastatin (CRESTOR) 40 MG tablet Take 1/2 tablet by mouth on Monday, Wednesday and Friday 10/15/14  Yes Leonie Man, MD  ticagrelor (BRILINTA) 90 MG TABS tablet Take 1 tablet (90 mg total) by mouth 2 (two) times daily. 07/24/14  Yes Brett Canales, PA-C  ondansetron (ZOFRAN ODT) 4 MG disintegrating tablet 4mg  ODT q4 hours prn nausea/vomit 04/19/15   Calvin Morrison, MD   Triage Vitals: BP 146/102 mmHg  Pulse 106  Temp(Src) 97.7 F (36.5 C) (Oral)  Resp 18  Ht 6\' 6"  (1.981 m)  Wt 239 lb (108.41 kg)  BMI 27.62 kg/m2  SpO2 97% Physical Exam  Constitutional: He is oriented to person, place, and time. He appears well-developed and well-nourished.  HENT:  Head: Normocephalic and atraumatic.  Mouth/Throat: Mucous membranes are normal.  Dry mm  Eyes: EOM are normal.  Neck: Normal range of motion.  Cardiovascular: Regular rhythm.  Tachycardia present.   Pulmonary/Chest: Effort normal.  Abdominal:  Soft. Bowel sounds are normal. There is tenderness. There is no guarding.  No peritonitis  Musculoskeletal: Normal range of motion.  Neurological: He is alert and oriented to person, place, and time.  Skin: Skin is warm and dry.  Psychiatric: He has a normal mood and affect. His behavior is normal.  Nursing note and vitals reviewed.   ED Course  Procedures (including critical care time) DIAGNOSTIC STUDIES: Oxygen Saturation is 97% on RA, normal by my interpretation.   COORDINATION OF CARE: 10:14 AM- Will order IV fluids, labs, CXR, EKG and pain and nausea medication. Pt verbalizes understanding and agrees to plan.  Medications  diatrizoate meglumine-sodium (GASTROGRAFIN) 66-10 % solution (not administered)  HYDROmorphone (DILAUDID) injection 1 mg (not administered)  ondansetron (ZOFRAN)  4 MG/2ML injection (not administered)  acetaminophen (TYLENOL) tablet 1,000 mg (not administered)  sodium chloride 0.9 % bolus 1,000 mL (0 mLs Intravenous Stopped 04/19/15 1255)  sodium chloride 0.9 % bolus 1,000 mL (0 mLs Intravenous Stopped 04/19/15 1255)  ondansetron (ZOFRAN) injection 4 mg (4 mg Intravenous Given 04/19/15 1024)  HYDROmorphone (DILAUDID) injection 0.5 mg (0.5 mg Intravenous Given 04/19/15 1024)  iohexol (OMNIPAQUE) 300 MG/ML solution 100 mL (100 mLs Intravenous Contrast Given 04/19/15 1346)  ondansetron (ZOFRAN) injection 4 mg (4 mg Intravenous Given 04/19/15 1443)    Labs Review Labs Reviewed  CBC WITH DIFFERENTIAL/PLATELET - Abnormal; Notable for the following:    WBC 14.1 (*)    Hemoglobin 17.1 (*)    Neutro Abs 9.7 (*)    All other components within normal limits  COMPREHENSIVE METABOLIC PANEL - Abnormal; Notable for the following:    Glucose, Bld 195 (*)    Total Protein 8.2 (*)    All other components within normal limits  LIPASE, BLOOD  TROPONIN I  LACTIC ACID, PLASMA    Imaging Review Dg Chest 2 View  04/19/2015  CLINICAL DATA:  Chest pain and cough beginning  yesterday. Chest pressure when coughing. EXAM: CHEST  2 VIEW COMPARISON:  Single view of the chest 07/22/2014. FINDINGS: The lungs are clear. Heart size is normal. No pneumothorax or pleural effusion. Azygos fissure is noted. Thoracic spondylosis also seen. IMPRESSION: No acute disease. Electronically Signed   By: Inge Rise M.D.   On: 04/19/2015 11:00   Ct Abdomen Pelvis W Contrast  04/19/2015  CLINICAL DATA:  Low pelvic pain for 1.5 days. EXAM: CT ABDOMEN AND PELVIS WITH CONTRAST TECHNIQUE: Multidetector CT imaging of the abdomen and pelvis was performed using the standard protocol following bolus administration of intravenous contrast. CONTRAST:  166mL OMNIPAQUE IOHEXOL 300 MG/ML  SOLN COMPARISON:  03/21/07 FINDINGS: Lower chest: There is no pleural effusion identified the lung bases are clear. Hepatobiliary: There is no focal liver abnormality. Previous cholecystectomy. Pancreas: Normal appearance of the pancreas. Spleen: Negative Adrenals/Urinary Tract: The adrenal glands are normal. Unremarkable appearance of the kidneys. The urinary bladder appears within normal limits. Stomach/Bowel: The stomach appears normal the patient is status post total collect. A Hartman's pouch is identified within the pelvis. The small bowel loops are mildly increased in caliber measuring up to 3.5 cm, image number 49 of series 4. Mild small bowel wall enhancement with surrounding inflammation. There is a small amount of free fluid within the white lower quadrant of the abdomen surrounding the small bowel loops, for example image 34 series 4. No abscess identified. Vascular/Lymphatic: Calcified atherosclerotic disease involves the abdominal aorta. No aneurysm. No enlarged retroperitoneal or mesenteric adenopathy. No enlarged pelvic or inguinal lymph nodes. Reproductive: Prostate gland and seminal vesicles are unremarkable. Other: There is no ascites or focal fluid collections within the abdomen or pelvis. Musculoskeletal:  The visualized bony structures are remarkable for degenerative disc disease within the lower thoracic spine. IMPRESSION: 1. Postoperative changes of total colectomy and Hartman's pouch formation. 2. There are mild inflammatory changes involving the mildly dilated right lower quadrant small bowel loops with surrounding fat stranding and free fluid. Findings compatible with acute enteritis. No evidence for perforation or abscess formation. No high-grade obstruction 3. Aortic atherosclerosis Electronically Signed   By: Kerby Moors M.D.   On: 04/19/2015 14:32   I have personally reviewed and evaluated these images and lab results as part of my medical decision-making.   EKG Interpretation   Date/Time:  Tuesday April 19 2015 10:32:29 EST Ventricular Rate:  100 PR Interval:  152 QRS Duration: 98 QT Interval:  335 QTC Calculation: 432 R Axis:   45 Text Interpretation:  Sinus tachycardia Borderline T wave abnormalities  Confirmed by Salvatore Poe  MD, Conlan Miceli ([id number]) on 04/19/2015 10:58:52 AM      MDM   Final diagnoses:  Nausea vomiting and diarrhea  Dehydration  Enteritis   Patient with colectomy history presents with recurrent nausea vomiting diarrhea. Clinical concern for enteritis. Patient had leukocytosis initially improved and then vomited further. CT scan obtained confirming antritis without abscess no bowel obstruction. Multiple fluid boluses given. Discussed options of observation versus close outpatient follow-up. Patient wishes to try third liter of fluid and oral challenge prior to final decision.  Results and differential diagnosis were discussed with the patient/parent/guardian. Xrays were independently reviewed by myself.  Close follow up outpatient was discussed, comfortable with the plan.   Medications  diatrizoate meglumine-sodium (GASTROGRAFIN) 66-10 % solution (not administered)  HYDROmorphone (DILAUDID) injection 1 mg (not administered)  ondansetron (ZOFRAN) 4 MG/2ML  injection (not administered)  acetaminophen (TYLENOL) tablet 1,000 mg (not administered)  sodium chloride 0.9 % bolus 1,000 mL (0 mLs Intravenous Stopped 04/19/15 1255)  sodium chloride 0.9 % bolus 1,000 mL (0 mLs Intravenous Stopped 04/19/15 1255)  ondansetron (ZOFRAN) injection 4 mg (4 mg Intravenous Given 04/19/15 1024)  HYDROmorphone (DILAUDID) injection 0.5 mg (0.5 mg Intravenous Given 04/19/15 1024)  iohexol (OMNIPAQUE) 300 MG/ML solution 100 mL (100 mLs Intravenous Contrast Given 04/19/15 1346)  ondansetron (ZOFRAN) injection 4 mg (4 mg Intravenous Given 04/19/15 1443)    Filed Vitals:   04/19/15 1308 04/19/15 1330 04/19/15 1400 04/19/15 1430  BP: 121/86 123/84 125/82 122/85  Pulse: 90 88 90 94  Temp:      TempSrc:      Resp: 12 14 16 26   Height:      Weight:      SpO2: 96% 94% 94% 99%    Final diagnoses:  Nausea vomiting and diarrhea  Dehydration  Enteritis      Calvin Morrison, MD 04/21/15 [phone]

## 2015-04-19 NOTE — ED Notes (Signed)
Pt made aware to return if symptoms worsen or if any life threatening symptoms occur.   

## 2015-04-20 ENCOUNTER — Encounter (HOSPITAL_COMMUNITY): Payer: Self-pay | Admitting: Internal Medicine

## 2015-04-20 DIAGNOSIS — Z9861 Coronary angioplasty status: Secondary | ICD-10-CM | POA: Diagnosis not present

## 2015-04-20 DIAGNOSIS — K529 Noninfective gastroenteritis and colitis, unspecified: Secondary | ICD-10-CM | POA: Diagnosis not present

## 2015-04-20 DIAGNOSIS — M109 Gout, unspecified: Secondary | ICD-10-CM | POA: Diagnosis present

## 2015-04-20 DIAGNOSIS — K5669 Other intestinal obstruction: Secondary | ICD-10-CM | POA: Diagnosis not present

## 2015-04-20 DIAGNOSIS — I252 Old myocardial infarction: Secondary | ICD-10-CM | POA: Diagnosis not present

## 2015-04-20 DIAGNOSIS — Z809 Family history of malignant neoplasm, unspecified: Secondary | ICD-10-CM | POA: Diagnosis not present

## 2015-04-20 DIAGNOSIS — K5190 Ulcerative colitis, unspecified, without complications: Secondary | ICD-10-CM | POA: Diagnosis present

## 2015-04-20 DIAGNOSIS — R112 Nausea with vomiting, unspecified: Secondary | ICD-10-CM | POA: Diagnosis present

## 2015-04-20 DIAGNOSIS — K91850 Pouchitis: Secondary | ICD-10-CM

## 2015-04-20 DIAGNOSIS — Z9049 Acquired absence of other specified parts of digestive tract: Secondary | ICD-10-CM | POA: Diagnosis not present

## 2015-04-20 DIAGNOSIS — E781 Pure hyperglyceridemia: Secondary | ICD-10-CM | POA: Diagnosis present

## 2015-04-20 DIAGNOSIS — K567 Ileus, unspecified: Secondary | ICD-10-CM | POA: Diagnosis present

## 2015-04-20 DIAGNOSIS — K51919 Ulcerative colitis, unspecified with unspecified complications: Secondary | ICD-10-CM | POA: Diagnosis not present

## 2015-04-20 DIAGNOSIS — A084 Viral intestinal infection, unspecified: Secondary | ICD-10-CM | POA: Diagnosis present

## 2015-04-20 DIAGNOSIS — Z8249 Family history of ischemic heart disease and other diseases of the circulatory system: Secondary | ICD-10-CM | POA: Diagnosis not present

## 2015-04-20 DIAGNOSIS — Z7982 Long term (current) use of aspirin: Secondary | ICD-10-CM | POA: Diagnosis not present

## 2015-04-20 DIAGNOSIS — I2510 Atherosclerotic heart disease of native coronary artery without angina pectoris: Secondary | ICD-10-CM

## 2015-04-20 DIAGNOSIS — I10 Essential (primary) hypertension: Secondary | ICD-10-CM | POA: Diagnosis present

## 2015-04-20 DIAGNOSIS — R109 Unspecified abdominal pain: Secondary | ICD-10-CM

## 2015-04-20 DIAGNOSIS — Z87891 Personal history of nicotine dependence: Secondary | ICD-10-CM | POA: Diagnosis not present

## 2015-04-20 DIAGNOSIS — Z833 Family history of diabetes mellitus: Secondary | ICD-10-CM | POA: Diagnosis not present

## 2015-04-20 DIAGNOSIS — K5660 Unspecified intestinal obstruction: Secondary | ICD-10-CM | POA: Diagnosis present

## 2015-04-20 DIAGNOSIS — E785 Hyperlipidemia, unspecified: Secondary | ICD-10-CM | POA: Diagnosis not present

## 2015-04-20 DIAGNOSIS — E119 Type 2 diabetes mellitus without complications: Secondary | ICD-10-CM | POA: Diagnosis present

## 2015-04-20 DIAGNOSIS — Z955 Presence of coronary angioplasty implant and graft: Secondary | ICD-10-CM | POA: Diagnosis not present

## 2015-04-20 DIAGNOSIS — Z881 Allergy status to other antibiotic agents status: Secondary | ICD-10-CM | POA: Diagnosis not present

## 2015-04-20 LAB — COMPREHENSIVE METABOLIC PANEL
ALBUMIN: 4.4 g/dL (ref 3.5–5.0)
ALT: 17 U/L (ref 17–63)
ANION GAP: 13 (ref 5–15)
AST: 19 U/L (ref 15–41)
Alkaline Phosphatase: 57 U/L (ref 38–126)
BILIRUBIN TOTAL: 1.7 mg/dL — AB (ref 0.3–1.2)
BUN: 15 mg/dL (ref 6–20)
CHLORIDE: 104 mmol/L (ref 101–111)
CO2: 21 mmol/L — ABNORMAL LOW (ref 22–32)
Calcium: 9.3 mg/dL (ref 8.9–10.3)
Creatinine, Ser: 0.79 mg/dL (ref 0.61–1.24)
GFR calc Af Amer: >60 mL/min (ref >60)
GFR calc non Af Amer: >60 mL/min (ref >60)
GLUCOSE: 216 mg/dL — AB (ref 65–99)
POTASSIUM: 4 mmol/L (ref 3.5–5.1)
SODIUM: 138 mmol/L (ref 135–145)
TOTAL PROTEIN: 7.8 g/dL (ref 6.5–8.1)

## 2015-04-20 LAB — CBC WITH DIFFERENTIAL/PLATELET
BASOS ABS: 0 10*3/uL (ref 0.0–0.1)
BASOS PCT: 0 %
EOS ABS: 0 10*3/uL (ref 0.0–0.7)
EOS PCT: 1 %
HCT: 45.1 % (ref 39.0–52.0)
Hemoglobin: 16 g/dL (ref 13.0–17.0)
Lymphocytes Relative: 16 %
Lymphs Abs: 1 10*3/uL (ref 0.7–4.0)
MCH: 31.5 pg (ref 26.0–34.0)
MCHC: 35.5 g/dL (ref 30.0–36.0)
MCV: 88.8 fL (ref 78.0–100.0)
MONO ABS: 0.8 10*3/uL (ref 0.1–1.0)
MONOS PCT: 14 %
NEUTROS ABS: 4.1 10*3/uL (ref 1.7–7.7)
Neutrophils Relative %: 69 %
PLATELETS: 205 10*3/uL (ref 150–400)
RBC: 5.08 MIL/uL (ref 4.22–5.81)
RDW: 12.4 % (ref 11.5–15.5)
WBC: 5.9 10*3/uL (ref 4.0–10.5)

## 2015-04-20 LAB — C DIFFICILE QUICK SCREEN W PCR REFLEX
C DIFFICILE (CDIFF) INTERP: NEGATIVE
C DIFFICILE (CDIFF) TOXIN: NEGATIVE
C DIFFICLE (CDIFF) ANTIGEN: NEGATIVE

## 2015-04-20 LAB — GLUCOSE, CAPILLARY
GLUCOSE-CAPILLARY: 232 mg/dL — AB (ref 65–99)
GLUCOSE-CAPILLARY: 199 mg/dL — AB (ref 65–99)
GLUCOSE-CAPILLARY: 15 mg/dL — AB (ref 65–99)
Glucose-Capillary: 183 mg/dL — ABNORMAL HIGH (ref 65–99)

## 2015-04-20 MED ORDER — OMEGA-3-ACID ETHYL ESTERS 1 G PO CAPS
2.0000 g | ORAL_CAPSULE | Freq: Two times a day (BID) | ORAL | Status: DC
  Filled 2015-04-20 (×3): qty 2

## 2015-04-20 MED ORDER — ROSUVASTATIN CALCIUM 20 MG PO TABS
40.0000 mg | ORAL_TABLET | Freq: Every day | ORAL | Status: DC
  Filled 2015-04-20 (×2): qty 2

## 2015-04-20 MED ORDER — ONDANSETRON HCL 4 MG/2ML IJ SOLN: 4.0000 mg | Freq: Four times a day (QID) | INTRAMUSCULAR | Status: DC | PRN

## 2015-04-20 MED ORDER — HYDROMORPHONE HCL 1 MG/ML IJ SOLN
1.0000 mg | INTRAMUSCULAR | Status: DC | PRN
  Administered 2015-04-20 (×2): 1 mg via INTRAVENOUS
  Filled 2015-04-20 (×2): qty 1

## 2015-04-20 MED ORDER — ACETAMINOPHEN 325 MG PO TABS: 650.0000 mg | ORAL_TABLET | Freq: Four times a day (QID) | ORAL | Status: DC | PRN

## 2015-04-20 MED ORDER — ASPIRIN EC 81 MG PO TBEC
81.0000 mg | DELAYED_RELEASE_TABLET | Freq: Every day | ORAL | Status: DC
  Administered 2015-04-21 – 2015-04-24 (×4): 81 mg via ORAL
  Filled 2015-04-20 (×4): qty 1

## 2015-04-20 MED ORDER — PROCHLORPERAZINE EDISYLATE 5 MG/ML IJ SOLN
10.0000 mg | Freq: Four times a day (QID) | INTRAMUSCULAR | Status: DC | PRN
  Administered 2015-04-20 – 2015-04-21 (×3): 10 mg via INTRAVENOUS
  Filled 2015-04-20 (×4): qty 2

## 2015-04-20 MED ORDER — ONDANSETRON HCL 4 MG PO TABS
4.0000 mg | ORAL_TABLET | ORAL | Status: DC | PRN
  Administered 2015-04-23: 4 mg via ORAL

## 2015-04-20 MED ORDER — INSULIN ASPART 100 UNIT/ML ~~LOC~~ SOLN
0.0000 [IU] | Freq: Every day | SUBCUTANEOUS | Status: DC
Start: 2015-04-20 — End: 2015-04-25

## 2015-04-20 MED ORDER — HYDROMORPHONE HCL 1 MG/ML IJ SOLN: 1.0000 mg | INTRAMUSCULAR | Status: DC | PRN

## 2015-04-20 MED ORDER — TICAGRELOR 90 MG PO TABS
90.0000 mg | ORAL_TABLET | Freq: Two times a day (BID) | ORAL | Status: DC
  Administered 2015-04-20 – 2015-04-23 (×7): 90 mg via ORAL
  Filled 2015-04-20 (×11): qty 1

## 2015-04-20 MED ORDER — SODIUM CHLORIDE 0.9 % IV SOLN
INTRAVENOUS | Status: AC
  Filled 2015-04-20: qty 1.5

## 2015-04-20 MED ORDER — ONDANSETRON HCL 4 MG PO TABS: 4.0000 mg | ORAL_TABLET | Freq: Four times a day (QID) | ORAL | Status: DC | PRN

## 2015-04-20 MED ORDER — INSULIN ASPART 100 UNIT/ML ~~LOC~~ SOLN
0.0000 [IU] | Freq: Three times a day (TID) | SUBCUTANEOUS | Status: DC
  Administered 2015-04-20: 3 [IU] via SUBCUTANEOUS

## 2015-04-20 MED ORDER — LISINOPRIL 5 MG PO TABS
2.5000 mg | ORAL_TABLET | Freq: Every day | ORAL | Status: DC
  Filled 2015-04-20 (×2): qty 1

## 2015-04-20 MED ORDER — RISAQUAD PO CAPS
2.0000 | ORAL_CAPSULE | Freq: Every day | ORAL | Status: DC
  Filled 2015-04-20 (×2): qty 2

## 2015-04-20 MED ORDER — ONDANSETRON HCL 4 MG/2ML IJ SOLN
INTRAMUSCULAR | Status: AC
  Administered 2015-04-20: 04:00:00
  Filled 2015-04-20: qty 2

## 2015-04-20 MED ORDER — HYDROMORPHONE HCL 1 MG/ML IJ SOLN
1.0000 mg | INTRAMUSCULAR | Status: DC | PRN
  Administered 2015-04-20 – 2015-04-21 (×6): 2 mg via INTRAVENOUS
  Administered 2015-04-21: 1 mg via INTRAVENOUS
  Administered 2015-04-21 – 2015-04-23 (×5): 2 mg via INTRAVENOUS
  Administered 2015-04-23: 1 mg via INTRAVENOUS
  Administered 2015-04-23 – 2015-04-24 (×6): 2 mg via INTRAVENOUS
  Filled 2015-04-20 (×11): qty 2
  Filled 2015-04-20: qty 1
  Filled 2015-04-20 (×7): qty 2

## 2015-04-20 MED ORDER — SODIUM CHLORIDE 0.9 % IV SOLN: INTRAVENOUS | Status: DC

## 2015-04-20 MED ORDER — SODIUM CHLORIDE 0.9 % IV SOLN
1.5000 g | Freq: Four times a day (QID) | INTRAVENOUS | Status: DC
  Administered 2015-04-20 – 2015-04-25 (×21): 1.5 g via INTRAVENOUS
  Filled 2015-04-20 (×23): qty 1.5

## 2015-04-20 MED ORDER — KCL IN DEXTROSE-NACL 20-5-0.9 MEQ/L-%-% IV SOLN
INTRAVENOUS | Status: DC
  Administered 2015-04-20 – 2015-04-21 (×3): via INTRAVENOUS

## 2015-04-20 MED ORDER — ONDANSETRON HCL 4 MG/2ML IJ SOLN
4.0000 mg | Freq: Once | INTRAMUSCULAR | Status: AC
  Administered 2015-04-20: 4 mg via INTRAVENOUS
  Filled 2015-04-20: qty 2

## 2015-04-20 MED ORDER — ONDANSETRON HCL 4 MG/2ML IJ SOLN
4.0000 mg | INTRAMUSCULAR | Status: DC | PRN
  Administered 2015-04-20 – 2015-04-24 (×20): 4 mg via INTRAVENOUS
  Filled 2015-04-20 (×22): qty 2

## 2015-04-20 MED ORDER — COQ10 100 MG PO CAPS: 100.0000 mg | ORAL_CAPSULE | Freq: Every day | ORAL | Status: DC

## 2015-04-20 MED ORDER — METOPROLOL TARTRATE 25 MG PO TABS
25.0000 mg | ORAL_TABLET | Freq: Two times a day (BID) | ORAL | Status: DC
  Filled 2015-04-20 (×3): qty 1

## 2015-04-20 MED ORDER — EZETIMIBE 10 MG PO TABS
10.0000 mg | ORAL_TABLET | Freq: Every day | ORAL | Status: DC
  Filled 2015-04-20 (×3): qty 1

## 2015-04-20 MED ORDER — SODIUM CHLORIDE 0.9 % IV SOLN
Freq: Once | INTRAVENOUS | Status: AC
  Administered 2015-04-20: 125 mL/h via INTRAVENOUS

## 2015-04-20 MED ORDER — FENTANYL CITRATE (PF) 100 MCG/2ML IJ SOLN
50.0000 ug | INTRAMUSCULAR | Status: DC | PRN
  Administered 2015-04-20: 50 ug via INTRAVENOUS
  Filled 2015-04-20: qty 2

## 2015-04-20 MED ORDER — HYDROMORPHONE HCL 1 MG/ML IJ SOLN
1.0000 mg | INTRAMUSCULAR | Status: DC | PRN
  Administered 2015-04-20: 1 mg via INTRAVENOUS
  Filled 2015-04-20: qty 1

## 2015-04-20 MED ORDER — HYDROMORPHONE HCL 1 MG/ML IJ SOLN
1.0000 mg | Freq: Once | INTRAMUSCULAR | Status: AC
  Administered 2015-04-20: 1 mg via INTRAVENOUS
  Filled 2015-04-20: qty 1

## 2015-04-20 NOTE — Progress Notes (Signed)
[facility]                                                                              Patient Demographics  Calvin Cruz, is a 55 y.o. male, DOB - [birth year], [id number]  Admit date - 04/19/2015   Admitting Physician Orvan Falconer, MD  Outpatient Primary MD for the patient is Sheela Stack, MD  LOS - 0   Chief Complaint  Patient presents with  . Emesis       Brief HPI   Calvin Cruz is an 55 y.o. male with hx of UC, s/p colectomy with Hartman's pouch formation, hx of gout, NSTEMI, CAD s/p DES June 2016, HTN, HLD, seen earlier today for abdominal pain, nausea and vomiting, returned to the ER as nausea and vomiting persisted. He has no fever or chills, but did have URI symptoms, and took one Augmentin yesterday. He has some ill contact, but not definite. His wife was not ill. In the ER his WBC was 5.9K, though was 14K before, and his abdominal CT showed focal inflammation c/w enterocolitis.    Assessment & Plan    Principal Problem: Enteritis with underlying history of ulcerative colitis, status post colectomy with Hartman's pouch - Continue IV fluid hydration, clear liquid diet, continue IV Unasyn - GI consulted - Stool studies including C. difficile, fecal lactoferrin, GI pathogen panel - Continue IV pain control, antiemetics  Active Problems:      Hyperlipidemia with target LDL less than 70 -Continue statin     CAD S/P DES PCI-circumflex - Continue aspirin, brilinta  Diabetes mellitus - Per patient's wife, hemoglobin A1c was checked last Friday and was 7.2, hence will not order. His endocrinologist is Dr. Forde Dandy. -Sliding scale Insulin for now   Code Status: Full Code    Family Communication: Discussed in detail with the patient, all imaging results, lab results explained to the patient and wife at the bedside   Disposition Plan:   Time Spent in minutes  25  minutes  Procedures  CT Abdomen and pelvis   Consults   GI   DVT  Prophylaxis  SCD's  Medications  Scheduled Meds: . acidophilus  2 capsule Oral Daily  . ampicillin-sulbactam (UNASYN) IV  1.5 g Intravenous Q6H  . aspirin EC  81 mg Oral Daily  . ezetimibe  10 mg Oral Daily  . insulin aspart  0-5 Units Subcutaneous QHS  . insulin aspart  0-9 Units Subcutaneous TID WC  . lisinopril  2.5 mg Oral Daily  . metoprolol tartrate  25 mg Oral BID  . omega-3 acid ethyl esters  2 g Oral BID  . rosuvastatin  40 mg Oral Daily  . ticagrelor  90 mg Oral BID   Continuous Infusions: . dextrose 5 % and 0.9 % NaCl with KCl 20 mEq/L 125 mL/hr at 04/20/15 0252   PRN Meds:.acetaminophen, HYDROmorphone (DILAUDID) injection, ondansetron **OR** ondansetron (ZOFRAN) IV, prochlorperazine   Antibiotics   Anti-infectives    Start     Dose/Rate Route Frequency Ordered Stop   04/20/15 0230  ampicillin-sulbactam (UNASYN) 1.5 g in sodium chloride 0.9 % 50 mL IVPB  1.5 g 100 mL/hr over 30 Minutes Intravenous Every 6 hours 04/20/15 0226          Subjective:   Calvin Cruz was seen and examined today. Patient, still having diarrhea, 1 time today, with nausea. Abdominal pain 6-7/10 nausea and vomiting..  Patient denies dizziness, chest pain, shortness of breath, new weakness, numbess, tingling. No acute events overnight.  Low-grade temperature.   Objective:   Filed Vitals:   04/19/15 2256 04/20/15 0200 04/20/15 0300  BP: 135/73 126/87 129/81  Pulse: 118 94 96  Temp: 97.9 F (36.6 C)  99.1 F (37.3 C)  TempSrc: Oral  Oral  Resp: 20    Height: 6\' 6"  (1.981 m)  6\' 6"  (1.981 m)  Weight: 108.41 kg (239 lb)  106.9 kg (235 lb 10.8 oz)  SpO2: 100% 100% 94%   No intake or output data in the 24 hours ending 04/20/15 1115   Wt Readings from Last 3 Encounters:  04/20/15 106.9 kg (235 lb 10.8 oz)  04/19/15 108.41 kg (239 lb)  11/17/14 107.82 kg (237 lb 11.2 oz)     Exam  General: Alert and oriented x 3uncomfortable  HEENT:  PERRLA, EOMI, Anicteric Sclera,  mucous membranes moist.   Neck: Supple, no JVD, no masses  CVS: S1 S2 auscultated, no rubs, murmurs or gallops. Regular rate and rhythm.  Respiratory: Clear to auscultation bilaterally, no wheezing, rales or rhonchi  Abdomen: Soft,  diffuse tenderness , nondistended, + bowel sounds  Ext: no cyanosis clubbing or edema  Neuro: AAOx3, Cr N's II- XII. Strength 5/5 upper and lower extremities bilaterally  Skin: No rashes  Psych: Normal affect and demeanor, alert and oriented x3    Data Review   Micro Results No results found for this or any previous visit (from the past 240 hour(s)).  Radiology Reports Dg Chest 2 View  04/19/2015  CLINICAL DATA:  Chest pain and cough beginning yesterday. Chest pressure when coughing. EXAM: CHEST  2 VIEW COMPARISON:  Single view of the chest 07/22/2014. FINDINGS: The lungs are clear. Heart size is normal. No pneumothorax or pleural effusion. Azygos fissure is noted. Thoracic spondylosis also seen. IMPRESSION: No acute disease. Electronically Signed   By: Inge Rise M.D.   On: 04/19/2015 11:00   Ct Abdomen Pelvis W Contrast  04/19/2015  CLINICAL DATA:  Low pelvic pain for 1.5 days. EXAM: CT ABDOMEN AND PELVIS WITH CONTRAST TECHNIQUE: Multidetector CT imaging of the abdomen and pelvis was performed using the standard protocol following bolus administration of intravenous contrast. CONTRAST:  158mL OMNIPAQUE IOHEXOL 300 MG/ML  SOLN COMPARISON:  03/21/07 FINDINGS: Lower chest: There is no pleural effusion identified the lung bases are clear. Hepatobiliary: There is no focal liver abnormality. Previous cholecystectomy. Pancreas: Normal appearance of the pancreas. Spleen: Negative Adrenals/Urinary Tract: The adrenal glands are normal. Unremarkable appearance of the kidneys. The urinary bladder appears within normal limits. Stomach/Bowel: The stomach appears normal the patient is status post total collect. A Hartman's pouch is identified within the pelvis. The  small bowel loops are mildly increased in caliber measuring up to 3.5 cm, image number 49 of series 4. Mild small bowel wall enhancement with surrounding inflammation. There is a small amount of free fluid within the white lower quadrant of the abdomen surrounding the small bowel loops, for example image 34 series 4. No abscess identified. Vascular/Lymphatic: Calcified atherosclerotic disease involves the abdominal aorta. No aneurysm. No enlarged retroperitoneal or mesenteric adenopathy. No enlarged pelvic or inguinal lymph nodes. Reproductive: Prostate  gland and seminal vesicles are unremarkable. Other: There is no ascites or focal fluid collections within the abdomen or pelvis. Musculoskeletal: The visualized bony structures are remarkable for degenerative disc disease within the lower thoracic spine. IMPRESSION: 1. Postoperative changes of total colectomy and Hartman's pouch formation. 2. There are mild inflammatory changes involving the mildly dilated right lower quadrant small bowel loops with surrounding fat stranding and free fluid. Findings compatible with acute enteritis. No evidence for perforation or abscess formation. No high-grade obstruction 3. Aortic atherosclerosis Electronically Signed   By: Kerby Moors M.D.   On: 04/19/2015 14:32    CBC  Recent Labs Lab 04/19/15 1030 04/19/15 2250  WBC 14.1* 5.9  HGB 17.1* 16.0  HCT 47.6 45.1  PLT 253 205  MCV 89.0 88.8  MCH 32.0 31.5  MCHC 35.9 35.5  RDW 12.5 12.4  LYMPHSABS 3.4 1.0  MONOABS 1.0 0.8  EOSABS 0.1 0.0  BASOSABS 0.1 0.0    Chemistries   Recent Labs Lab 04/19/15 1030 04/19/15 2250  NA 138 138  K 4.0 4.0  CL 103 104  CO2 23 21*  GLUCOSE 195* 216*  BUN 15 15  CREATININE 0.64 0.79  CALCIUM 9.7 9.3  AST 19 19  ALT 20 17  ALKPHOS 69 57  BILITOT 0.9 1.7*   ------------------------------------------------------------------------------------------------------------------ estimated creatinine clearance is 133.3  mL/min (by C-G formula based on Cr of 0.79). ------------------------------------------------------------------------------------------------------------------ No results for input(s): HGBA1C in the last 72 hours. ------------------------------------------------------------------------------------------------------------------ No results for input(s): CHOL, HDL, LDLCALC, TRIG, CHOLHDL, LDLDIRECT in the last 72 hours. ------------------------------------------------------------------------------------------------------------------ No results for input(s): TSH, T4TOTAL, T3FREE, THYROIDAB in the last 72 hours.  Invalid input(s): FREET3 ------------------------------------------------------------------------------------------------------------------ No results for input(s): VITAMINB12, FOLATE, FERRITIN, TIBC, IRON, RETICCTPCT in the last 72 hours.  Coagulation profile No results for input(s): INR, PROTIME in the last 168 hours.  No results for input(s): DDIMER in the last 72 hours.  Cardiac Enzymes  Recent Labs Lab 04/19/15 1030  TROPONINI <0.03   ------------------------------------------------------------------------------------------------------------------ Invalid input(s): POCBNP  No results for input(s): GLUCAP in the last 72 hours.   Calvin Cruz M.D. [facility] 04/20/2015, 11:15 AM  Pager: [phone] Between 7am to 7pm - call Pager - [phone]  After 7pm go to [email or web address] - password [email or web address]  Call night coverage person covering after 7pm

## 2015-04-20 NOTE — ED Notes (Signed)
hospitalist at the bedside 

## 2015-04-20 NOTE — Progress Notes (Signed)
Inpatient Diabetes Program Recommendations  AACE/ADA: New Consensus Statement on Inpatient Glycemic Control (2015)  Target Ranges:  Prepandial:   less than 140 mg/dL      Peak postprandial:   less than 180 mg/dL (1-2 hours)      Critically ill patients:  140 - 180 mg/dL    Results for Calvin Cruz, Calvin Cruz (MRN [id number]) as of 04/20/2015 10:48  Ref. Range 04/19/2015 10:30 04/19/2015 10:32 04/19/2015 10:47 04/19/2015 14:03 04/19/2015 22:50  Glucose Latest Ref Range: 65-99 mg/dL 195 (H)    216 (H)    3/1 No hx of DM noted.  Currently on sensitive correction scale.  Might consider ordering a Hgb A1c to assess glycemic control.  [place], CDE. M.Ed. Pager [phone] Inpatient Diabetes Coordinator

## 2015-04-20 NOTE — H&P (Signed)
[facility] History and Physical  AUTHUR LAUGHEAD [id number] DOB: 06-27-1960    PCP:   Sheela Stack, MD   Chief Complaint: persistent abdominal pain and nauseas, vomiting.   HPI: Calvin Cruz is an 55 y.o. male with hx of UC, s/p colectomy with Hartman's pouch formation, hx of gout, NSTEMI, CAD s/p DES June 2016, HTN, HLD, seen earlier today for abdominal pain, nausea and vomiting, returned to the ER as nausea and vomiting persisted.  He has no fever or chills, but did have URI symptoms, and took one Augmentin yesterday.  He has some ill contact, but not definite.  His wife was not ill.  In the ER his WBC was 5.9K, though was 14K before, and his abdominal CT showed focal inflammation c/w enterocolitis.  His K and renal fx was normal.  BS was slightly elevated.  Hospitalist was asked to admit him for symptomatic Tx of likely enterocolitis.   Rewiew of Systems:  Constitutional: Negative for malaise, fever and chills. No significant weight loss or weight gain Eyes: Negative for eye pain, redness and discharge, diplopia, visual changes, or flashes of light. ENMT: Negative for ear pain, hoarseness, nasal congestion, sinus pressure and sore throat. No headaches; tinnitus, drooling, or problem swallowing. Cardiovascular: Negative for chest pain, palpitations, diaphoresis, dyspnea and peripheral edema. ; No orthopnea, PND Respiratory: Negative for cough, hemoptysis, wheezing and stridor. No pleuritic chestpain. Gastrointestinal: Negative for  constipation,  melena, blood in stool, hematemesis, jaundice and rectal bleeding.    Genitourinary: Negative for frequency, dysuria, incontinence,flank pain and hematuria; Musculoskeletal: Negative for back pain and neck pain. Negative for swelling and trauma.;  Skin: . Negative for pruritus, rash, abrasions, bruising and skin lesion.; ulcerations Neuro: Negative for headache, lightheadedness and neck stiffness. Negative for weakness, altered  level of consciousness , altered mental status, extremity weakness, burning feet, involuntary movement, seizure and syncope.  Psych: negative for anxiety, depression, insomnia, tearfulness, panic attacks, hallucinations, paranoia, suicidal or homicidal ideation    Past Medical History  Diagnosis Date  . Ulcerative colitis   . Gout 2014  . Fatigue 11/28/2012  . Erythrocytosis 11/28/2012  . Non-STEMI (non-ST elevated myocardial infarction) ([place]) 07/22/2014    a) RCA: OK, LM: Okay ; LAD: Proximal to mid 40%; RI: 30%;LCx: Mid to distal 90%, distal 20%, OM1 50% --> DES PCI  . CAD S/P DES PCI-circumflex 07/22/2014    PCI mCx: PCI: 2.75 x 18 mm Xience Alpine DES to mid LCx at takeoff of OM1 (OM1 jailed 50% stenosis)  . Essential hypertension   . Hyperlipidemia with target LDL less than 70   . Hypertriglyceridemia 11/16/2014    Past Surgical History  Procedure Laterality Date  . Colectomy    . Hernia repair    . Cardiac catheterization N/A 07/23/2014    Procedure: Left Heart Cath and Coronary Angiography;  Surgeon: Leonie Man, MD;  Location: [place] [facility];  Service: Cardiovascular;  RCA: Mild disease; LM: Okay ; LAD: Proximal to mid 40%; RI: 30%;LCx: Mid to distal 90%, distal 20%, OM1 50%; Mid inferolateral HK, EF 55-65%   . Cardiac catheterization N/A 07/23/2014    Procedure: Coronary Stent Intervention;  Surgeon: Leonie Man, MD;  Location: [place] [facility];  Service: Cardiovascular; PCI: 2.75 x 18 mm Xience Alpine DES to mid LCx at takeoff of OM1 (OM1 jailed 50% stenosis)  . Transthoracic echocardiogram  07/22/2014    EF 55-60%, normal wall motion, normal diastolic function,  no valve lesions  Medications:  HOME MEDS: Prior to Admission medications   Medication Sig Start Date End Date Taking? Authorizing Provider  acetaminophen (TYLENOL) 325 MG tablet Take 650 mg by mouth every 6 (six) hours as needed for mild pain.    Historical Provider, MD  acidophilus (RISAQUAD) CAPS  capsule Take 2 capsules by mouth daily.    Historical Provider, MD  aspirin EC 81 MG EC tablet Take 1 tablet (81 mg total) by mouth daily. 07/24/14   Brett Canales, PA-C  Coenzyme Q10 (COQ10) 100 MG CAPS Take 100 mg by mouth daily. GRADUALLY INCREASE TO 300 MG DAILY 11/17/14   Leonie Man, MD  diphenoxylate-atropine (LOMOTIL) 2.5-0.025 MG per tablet Take 2 tablets by mouth 2 (two) times daily. Twice daily and as needed for diarrhea    Historical Provider, MD  ezetimibe (ZETIA) 10 MG tablet Take 1 tablet (10 mg total) by mouth daily. 01/20/15   Leonie Man, MD  HYDROmorphone (DILAUDID) 4 MG tablet Take 1 tablet (4 mg total) by mouth every 6 (six) hours as needed for severe pain. 04/19/15   Fredia Sorrow, MD  lisinopril (PRINIVIL,ZESTRIL) 2.5 MG tablet Take 1 tablet (2.5 mg total) by mouth daily. 09/01/14   Liliane Shi, PA-C  metoprolol tartrate (LOPRESSOR) 25 MG tablet Take 1 tablet (25 mg total) by mouth 2 (two) times daily. 07/24/14   Brett Canales, PA-C  nitroGLYCERIN (NITROSTAT) 0.4 MG SL tablet Place 1 tablet (0.4 mg total) under the tongue every 5 (five) minutes x 3 doses as needed for chest pain. 07/24/14   Brett Canales, PA-C  omega-3 acid ethyl esters (LOVAZA) 1 G capsule Take 2 capsules (2 g total) by mouth 2 (two) times daily. 10/15/14   Leonie Man, MD  ondansetron (ZOFRAN ODT) 4 MG disintegrating tablet 4mg  ODT q4 hours prn nausea/vomit 04/19/15   [name], MD  OVER THE COUNTER MEDICATION Patient takes 2 by mouth daily - Vita - Sprout Capsules - Multi Vitamin    Historical Provider, MD  rifaximin (XIFAXAN) 550 MG TABS tablet Take 550 mg by mouth 2 (two) times daily.    Historical Provider, MD  rosuvastatin (CRESTOR) 40 MG tablet Take 1/2 tablet by mouth on Monday, Wednesday and Friday 10/15/14   Leonie Man, MD  ticagrelor (BRILINTA) 90 MG TABS tablet Take 1 tablet (90 mg total) by mouth 2 (two) times daily. 07/24/14   Brett Canales, PA-C     Allergies:  Allergies  Allergen  Reactions  . Flagyl [Metronidazole] Itching  . Ciprofloxacin Itching and Nausea And Vomiting  . Doxycycline Itching and Other (See Comments)    Redness of skin, burning sensation  . Morphine And Related Itching    Social History:   reports that he quit smoking about 13 years ago. His smoking use included Cigarettes. He has never used smokeless tobacco. He reports that he does not drink alcohol or use illicit drugs.  Family History: Family History  Problem Relation Age of Onset  . Diabetes Mother   . Coronary artery disease Mother   . Cancer Father 59    lung  . Diabetes Brother      Physical Exam: Filed Vitals:   04/19/15 2256  BP: 135/73  Pulse: 118  Temp: 97.9 F (36.6 C)  TempSrc: Oral  Resp: 20  Height: 6\' 6"  (1.981 m)  Weight: 108.41 kg (239 lb)  SpO2: 100%   Blood pressure 135/73, pulse 118, temperature 97.9 F (36.6 C), temperature source  Oral, resp. rate 20, height 6\' 6"  (1.981 m), weight 108.41 kg (239 lb), SpO2 100 %.  GEN:  Pleasant patient lying in the stretcher in no acute distress; cooperative with exam. PSYCH:  alert and oriented x4; does not appear anxious or depressed; affect is appropriate. HEENT: Mucous membranes pink and anicteric; PERRLA; EOM intact; no cervical lymphadenopathy nor thyromegaly or carotid bruit; no JVD; There were no stridor. Neck is very supple. Breasts:: Not examined CHEST WALL: No tenderness CHEST: Normal respiration, clear to auscultation bilaterally.  HEART: Regular rate and rhythm.  There are no murmur, rub, or gallops.   BACK: No kyphosis or scoliosis; no CVA tenderness ABDOMEN: soft and tender to touch,  no masses, no organomegaly, normal abdominal bowel sounds; no pannus; no intertriginous candida. There is no rebound and no distention. Rectal Exam: Not done EXTREMITIES: No bone or joint deformity; age-appropriate arthropathy of the hands and knees; no edema; no ulcerations.  There is no calf tenderness. Genitalia: not  examined PULSES: 2+ and symmetric SKIN: Normal hydration no rash or ulceration CNS: Cranial nerves 2-12 grossly intact no focal lateralizing neurologic deficit.  Speech is fluent; uvula elevated with phonation, facial symmetry and tongue midline. DTR are normal bilaterally, cerebella exam is intact, barbinski is negative and strengths are equaled bilaterally.  No sensory loss.   Labs on Admission:  Basic Metabolic Panel:  Recent Labs Lab 04/19/15 1030 04/19/15 2250  NA 138 138  K 4.0 4.0  CL 103 104  CO2 23 21*  GLUCOSE 195* 216*  BUN 15 15  CREATININE 0.64 0.79  CALCIUM 9.7 9.3   Liver Function Tests:  Recent Labs Lab 04/19/15 1030 04/19/15 2250  AST 19 19  ALT 20 17  ALKPHOS 69 57  BILITOT 0.9 1.7*  PROT 8.2* 7.8  ALBUMIN 4.7 4.4    Recent Labs Lab 04/19/15 1030  LIPASE 21   CBC:  Recent Labs Lab 04/19/15 1030 04/19/15 2250  WBC 14.1* 5.9  NEUTROABS 9.7* 4.1  HGB 17.1* 16.0  HCT 47.6 45.1  MCV 89.0 88.8  PLT 253 205   Cardiac Enzymes:  Recent Labs Lab 04/19/15 1030  TROPONINI <0.03   Radiological Exams on Admission: Dg Chest 2 View  04/19/2015  CLINICAL DATA:  Chest pain and cough beginning yesterday. Chest pressure when coughing. EXAM: CHEST  2 VIEW COMPARISON:  Single view of the chest 07/22/2014. FINDINGS: The lungs are clear. Heart size is normal. No pneumothorax or pleural effusion. Azygos fissure is noted. Thoracic spondylosis also seen. IMPRESSION: No acute disease. Electronically Signed   By: Inge Rise M.D.   On: 04/19/2015 11:00   Ct Abdomen Pelvis W Contrast  04/19/2015  CLINICAL DATA:  Low pelvic pain for 1.5 days. EXAM: CT ABDOMEN AND PELVIS WITH CONTRAST TECHNIQUE: Multidetector CT imaging of the abdomen and pelvis was performed using the standard protocol following bolus administration of intravenous contrast. CONTRAST:  165mL OMNIPAQUE IOHEXOL 300 MG/ML  SOLN COMPARISON:  03/21/07 FINDINGS: Lower chest: There is no pleural  effusion identified the lung bases are clear. Hepatobiliary: There is no focal liver abnormality. Previous cholecystectomy. Pancreas: Normal appearance of the pancreas. Spleen: Negative Adrenals/Urinary Tract: The adrenal glands are normal. Unremarkable appearance of the kidneys. The urinary bladder appears within normal limits. Stomach/Bowel: The stomach appears normal the patient is status post total collect. A Hartman's pouch is identified within the pelvis. The small bowel loops are mildly increased in caliber measuring up to 3.5 cm, image number 49 of series 4.  Mild small bowel wall enhancement with surrounding inflammation. There is a small amount of free fluid within the white lower quadrant of the abdomen surrounding the small bowel loops, for example image 34 series 4. No abscess identified. Vascular/Lymphatic: Calcified atherosclerotic disease involves the abdominal aorta. No aneurysm. No enlarged retroperitoneal or mesenteric adenopathy. No enlarged pelvic or inguinal lymph nodes. Reproductive: Prostate gland and seminal vesicles are unremarkable. Other: There is no ascites or focal fluid collections within the abdomen or pelvis. Musculoskeletal: The visualized bony structures are remarkable for degenerative disc disease within the lower thoracic spine. IMPRESSION: 1. Postoperative changes of total colectomy and Hartman's pouch formation. 2. There are mild inflammatory changes involving the mildly dilated right lower quadrant small bowel loops with surrounding fat stranding and free fluid. Findings compatible with acute enteritis. No evidence for perforation or abscess formation. No high-grade obstruction 3. Aortic atherosclerosis Electronically Signed   By: Kerby Moors M.D.   On: 04/19/2015 14:32    EKG: Independently reviewed.   Assessment/Plan Present on Admission:  . Enterocolitis . Hyperlipidemia with target LDL less than 70 . Pouchitis ([place]) . Ulcerative colitis ([place])  PLAN:     Enterocolitis:  Possible viral, but unlikely to be exacerbation of UC.  Will give IVF, Tx symptoms so he can take his cardiac meds.  Give IV Fentanyl and IV Zofran.  Start IV Unasyn.  CAD:  He will continue with his Brilinta.  His PTCA and DES stent was less than a year ago.  WIll continue heart meds.  HLD: continue with his statin.     Other plans as per orders. Code Status: FULL Haskel Khan, MD. FACP [facility] Pager [phone] 7pm to 7am.  04/20/2015, 1:53 AM

## 2015-04-20 NOTE — Consult Note (Signed)
Reason for Consult: enteritis Referring Physician: Hospitalist Calvin Cruz is an 55 y.o. male.  HPI: Admitted thru the ED last night with c/o abdominal pain. Abdominal pain for about 5 days. Seen in ED Tuesday and again yesterday.  CT revealed finding compatible with acute enteritis. He says he had a low grade fever of 99.  He was having on average of 6 to 8 stools a day. Stools are "pure water".  . Hx of UC and total colectomy 13 yrs a go by Dr. Morton Stall at [facility].  He has an ileal pouch. Patient also has a ventral hernia.  His appetite is not good.  He has nausea and vomiting.  No melena or BRRB.  Patient is allergic to Cipro and Flagyl.  He tells me his wife has not been sick.  Primary Care Dr. Reynold Bowen in [place]. Has not seen Dr. Morton Stall in 10 yrs.   Past Medical History  Diagnosis Date  . Ulcerative colitis   . Gout 2014  . Fatigue 11/28/2012  . Erythrocytosis 11/28/2012  . Non-STEMI (non-ST elevated myocardial infarction) ([place]) 07/22/2014    a) RCA: OK, LM: Okay ; LAD: Proximal to mid 40%; RI: 30%;LCx: Mid to distal 90%, distal 20%, OM1 50% --> DES PCI  . CAD S/P DES PCI-circumflex 07/22/2014    PCI mCx: PCI: 2.75 x 18 mm Xience Alpine DES to mid LCx at takeoff of OM1 (OM1 jailed 50% stenosis)  . Essential hypertension   . Hyperlipidemia with target LDL less than 70   . Hypertriglyceridemia 11/16/2014    Past Surgical History  Procedure Laterality Date  . Colectomy    . Hernia repair    . Cardiac catheterization N/A 07/23/2014    Procedure: Left Heart Cath and Coronary Angiography;  Surgeon: Leonie Man, MD;  Location: [place] [facility];  Service: Cardiovascular;  RCA: Mild disease; LM: Okay ; LAD: Proximal to mid 40%; RI: 30%;LCx: Mid to distal 90%, distal 20%, OM1 50%; Mid inferolateral HK, EF 55-65%   . Cardiac catheterization N/A 07/23/2014    Procedure: Coronary Stent Intervention;  Surgeon: Leonie Man, MD;  Location: [facility];  Service:  Cardiovascular; PCI: 2.75 x 18 mm Xience Alpine DES to mid LCx at takeoff of OM1 (OM1 jailed 50% stenosis)  . Transthoracic echocardiogram  07/22/2014    EF 55-60%, normal wall motion, normal diastolic function,  no valve lesions     Family History  Problem Relation Age of Onset  . Diabetes Mother   . Coronary artery disease Mother   . Cancer Father 73    lung  . Diabetes Brother     Social History:  reports that he quit smoking about 13 years ago. His smoking use included Cigarettes. He has never used smokeless tobacco. He reports that he does not drink alcohol or use illicit drugs.  Allergies:  Allergies  Allergen Reactions  . Flagyl [Metronidazole] Itching  . Ciprofloxacin Itching and Nausea And Vomiting  . Doxycycline Itching and Other (See Comments)    Redness of skin, burning sensation  . Morphine And Related Itching    Medications: I have reviewed the patient's current medications.  Results for orders placed or performed during the hospital encounter of 04/19/15 (from the past 48 hour(s))  CBC with Differential     Status: None   Collection Time: 04/19/15 10:50 PM  Result Value Ref Range   WBC 5.9 4.0 - 10.5 K/uL   RBC 5.08 4.22 - 5.81 MIL/uL  Hemoglobin 16.0 13.0 - 17.0 g/dL   HCT 45.1 39.0 - 52.0 %   MCV 88.8 78.0 - 100.0 fL   MCH 31.5 26.0 - 34.0 pg   MCHC 35.5 30.0 - 36.0 g/dL   RDW 12.4 11.5 - 15.5 %   Platelets 205 150 - 400 K/uL   Neutrophils Relative % 69 %   Neutro Abs 4.1 1.7 - 7.7 K/uL   Lymphocytes Relative 16 %   Lymphs Abs 1.0 0.7 - 4.0 K/uL   Monocytes Relative 14 %   Monocytes Absolute 0.8 0.1 - 1.0 K/uL   Eosinophils Relative 1 %   Eosinophils Absolute 0.0 0.0 - 0.7 K/uL   Basophils Relative 0 %   Basophils Absolute 0.0 0.0 - 0.1 K/uL  Comprehensive metabolic panel     Status: Abnormal   Collection Time: 04/19/15 10:50 PM  Result Value Ref Range   Sodium 138 135 - 145 mmol/L   Potassium 4.0 3.5 - 5.1 mmol/L   Chloride 104 101 - 111  mmol/L   CO2 21 (L) 22 - 32 mmol/L   Glucose, Bld 216 (H) 65 - 99 mg/dL   BUN 15 6 - 20 mg/dL   Creatinine, Ser 0.79 0.61 - 1.24 mg/dL   Calcium 9.3 8.9 - 10.3 mg/dL   Total Protein 7.8 6.5 - 8.1 g/dL   Albumin 4.4 3.5 - 5.0 g/dL   AST 19 15 - 41 U/L   ALT 17 17 - 63 U/L   Alkaline Phosphatase 57 38 - 126 U/L   Total Bilirubin 1.7 (H) 0.3 - 1.2 mg/dL   GFR calc non Af Amer >60 >60 mL/min   GFR calc Af Amer >60 >60 mL/min    Comment: (NOTE) The eGFR has been calculated using the CKD EPI equation. This calculation has not been validated in all clinical situations. eGFR's persistently <60 mL/min signify possible Chronic Kidney Disease.    Anion gap 13 5 - 15    Dg Chest 2 View  04/19/2015  CLINICAL DATA:  Chest pain and cough beginning yesterday. Chest pressure when coughing. EXAM: CHEST  2 VIEW COMPARISON:  Single view of the chest 07/22/2014. FINDINGS: The lungs are clear. Heart size is normal. No pneumothorax or pleural effusion. Azygos fissure is noted. Thoracic spondylosis also seen. IMPRESSION: No acute disease. Electronically Signed   By: Inge Rise M.D.   On: 04/19/2015 11:00   Ct Abdomen Pelvis W Contrast  04/19/2015  CLINICAL DATA:  Low pelvic pain for 1.5 days. EXAM: CT ABDOMEN AND PELVIS WITH CONTRAST TECHNIQUE: Multidetector CT imaging of the abdomen and pelvis was performed using the standard protocol following bolus administration of intravenous contrast. CONTRAST:  1105m OMNIPAQUE IOHEXOL 300 MG/ML  SOLN COMPARISON:  03/21/07 FINDINGS: Lower chest: There is no pleural effusion identified the lung bases are clear. Hepatobiliary: There is no focal liver abnormality. Previous cholecystectomy. Pancreas: Normal appearance of the pancreas. Spleen: Negative Adrenals/Urinary Tract: The adrenal glands are normal. Unremarkable appearance of the kidneys. The urinary bladder appears within normal limits. Stomach/Bowel: The stomach appears normal the patient is status post total  collect. A Hartman's pouch is identified within the pelvis. The small bowel loops are mildly increased in caliber measuring up to 3.5 cm, image number 49 of series 4. Mild small bowel wall enhancement with surrounding inflammation. There is a small amount of free fluid within the white lower quadrant of the abdomen surrounding the small bowel loops, for example image 34 series 4. No abscess identified. Vascular/Lymphatic:  Calcified atherosclerotic disease involves the abdominal aorta. No aneurysm. No enlarged retroperitoneal or mesenteric adenopathy. No enlarged pelvic or inguinal lymph nodes. Reproductive: Prostate gland and seminal vesicles are unremarkable. Other: There is no ascites or focal fluid collections within the abdomen or pelvis. Musculoskeletal: The visualized bony structures are remarkable for degenerative disc disease within the lower thoracic spine. IMPRESSION: 1. Postoperative changes of total colectomy and Hartman's pouch formation. 2. There are mild inflammatory changes involving the mildly dilated right lower quadrant small bowel loops with surrounding fat stranding and free fluid. Findings compatible with acute enteritis. No evidence for perforation or abscess formation. No high-grade obstruction 3. Aortic atherosclerosis Electronically Signed   By: Kerby Moors M.D.   On: 04/19/2015 14:32    ROS Blood pressure 129/81, pulse 96, temperature 99.1 F (37.3 C), temperature source Oral, resp. rate 20, height '6\' 6"'  (1.981 m), weight 235 lb 10.8 oz (106.9 kg), SpO2 94 %. Physical Exam Alert and oriented. Skin warm and dry. Oral mucosa is moist.   . Sclera anicteric, conjunctivae is pink. Thyroid not enlarged. No cervical lymphadenopathy. Lungs clear. Heart regular rate and rhythm.  Abdomen is soft. Bowel sounds are positive. No hepatomegaly. No abdominal masses felt. Diffuse abdominal tenderness.   No edema to lower extremities.      Assessment/Plan: Probable acute enteritis. Agree with  Unasyn. I will discuss with Dr. Laural Golden GI pathogen.   SETZER,TERRI W 04/20/2015, 7:43 AM    GI attending note: Patient interviewed and examined. Abdominopelvic CT and lab studies reviewed. Patient has acute enteritis with ileus most likely of viral etiology. Do not believe he has pouchitis resulting in small bowel obstruction. While stool studies are pending he is being treated empirically with Unasyn. IV fluid rate increased to 175 mL per hour. Hydromorphone interval change to every 2 hours as needed. Monitor urine output.

## 2015-04-21 DIAGNOSIS — K5190 Ulcerative colitis, unspecified, without complications: Secondary | ICD-10-CM

## 2015-04-21 LAB — GLUCOSE, CAPILLARY
GLUCOSE-CAPILLARY: 216 mg/dL — AB (ref 65–99)
Glucose-Capillary: 214 mg/dL — ABNORMAL HIGH (ref 65–99)
Glucose-Capillary: 200 mg/dL — ABNORMAL HIGH (ref 65–99)
Glucose-Capillary: 227 mg/dL — ABNORMAL HIGH (ref 65–99)

## 2015-04-21 LAB — GASTROINTESTINAL PANEL BY PCR, STOOL (REPLACES STOOL CULTURE)

## 2015-04-21 LAB — CBC
HEMATOCRIT: 39.7 % (ref 39.0–52.0)
Hemoglobin: 13.9 g/dL (ref 13.0–17.0)
MCH: 31.1 pg (ref 26.0–34.0)
MCHC: 35 g/dL (ref 30.0–36.0)
MCV: 88.8 fL (ref 78.0–100.0)
Platelets: 208 10*3/uL (ref 150–400)
RBC: 4.47 MIL/uL (ref 4.22–5.81)
RDW: 12.2 % (ref 11.5–15.5)
WBC: 4.4 10*3/uL (ref 4.0–10.5)

## 2015-04-21 LAB — BASIC METABOLIC PANEL
ANION GAP: 8 (ref 5–15)
BUN: 11 mg/dL (ref 6–20)
CALCIUM: 8.4 mg/dL — AB (ref 8.9–10.3)
CO2: 25 mmol/L (ref 22–32)
Chloride: 103 mmol/L (ref 101–111)
Creatinine, Ser: 0.61 mg/dL (ref 0.61–1.24)
GFR calc Af Amer: >60 mL/min (ref >60)
GFR calc non Af Amer: >60 mL/min (ref >60)
GLUCOSE: 225 mg/dL — AB (ref 65–99)
POTASSIUM: 3.9 mmol/L (ref 3.5–5.1)
Sodium: 136 mmol/L (ref 135–145)

## 2015-04-21 MED ORDER — METHYLPREDNISOLONE SODIUM SUCC 125 MG IJ SOLR
60.0000 mg | INTRAMUSCULAR | Status: DC
  Administered 2015-04-21 – 2015-04-24 (×3): 60 mg via INTRAVENOUS
  Filled 2015-04-21 (×4): qty 2

## 2015-04-21 MED ORDER — POTASSIUM CHLORIDE IN NACL 20-0.9 MEQ/L-% IV SOLN
INTRAVENOUS | Status: AC
  Administered 2015-04-21 – 2015-04-22 (×2): via INTRAVENOUS

## 2015-04-21 MED ORDER — SIMETHICONE 80 MG PO CHEW
160.0000 mg | CHEWABLE_TABLET | Freq: Four times a day (QID) | ORAL | Status: AC
  Administered 2015-04-21 (×2): 160 mg via ORAL
  Filled 2015-04-21 (×3): qty 2

## 2015-04-21 MED ORDER — PANTOPRAZOLE SODIUM 40 MG IV SOLR
40.0000 mg | Freq: Two times a day (BID) | INTRAVENOUS | Status: DC
  Administered 2015-04-21 – 2015-04-24 (×8): 40 mg via INTRAVENOUS
  Filled 2015-04-21 (×8): qty 40

## 2015-04-21 NOTE — Progress Notes (Signed)
Patient feels some better compared to this morning. He is less bloated and he is having less pain. He is having intermittent hiccups. He is passing greenish stools but no flatus. Abdomen remains distended but not tense. Bowel sounds are hypoactive and more frequent than yesterday. He has mild tenderness across upper abdomen. Lab studies reviewed. WBC 4.4. Was 14.1 on admission. Electrolytes BUN and creatinine are normal. GI pathogen panel is negative.  Assessment: Acute enteritis most likely secondary to viral illness complicated by ileus. Will begin clear liquids when he begins to pass flatus. Will give 4 doses of Mylicon. CBC with differential in a.m.

## 2015-04-21 NOTE — Progress Notes (Signed)
Patient ID: Calvin Cruz, male   DOB: 01/21/1961, 55 y.o.   MRN: [id number] States he feels about 10 to 15% better. He still has some nausea.  Continues to have some abdominal pain but is better than yesterday. He had 2 stools yesterday. One was watery and the last was more pasty. He has very low grade fever. Filed Vitals:   04/20/15 2203 04/21/15 0447  BP: 131/82 122/76  Pulse: 86 82  Temp: 99.1 F (37.3 C) 98.5 F (36.9 C)  Resp: 20 20  I/O last 3 completed shifts: In: 1080 [P.O.:1080] Out: -   CBC    Component Value Date/Time   WBC 4.4 04/21/2015 0610   WBC 6.0 12/11/2012 1249   RBC 4.47 04/21/2015 0610   RBC 4.86 12/11/2012 1249   HGB 13.9 04/21/2015 0610   HGB 15.2 12/11/2012 1249   HCT 39.7 04/21/2015 0610   HCT 43.2 12/11/2012 1249   PLT 208 04/21/2015 0610   PLT 183 12/11/2012 1249   MCV 88.8 04/21/2015 0610   MCV 89.0 12/11/2012 1249   MCH 31.1 04/21/2015 0610   MCH 31.3 12/11/2012 1249   MCHC 35.0 04/21/2015 0610   MCHC 35.2 12/11/2012 1249   RDW 12.2 04/21/2015 0610   RDW 12.7 12/11/2012 1249   LYMPHSABS 1.0 04/19/2015 2250   LYMPHSABS 2.7 12/11/2012 1249   MONOABS 0.8 04/19/2015 2250   MONOABS 0.4 12/11/2012 1249   EOSABS 0.0 04/19/2015 2250   EOSABS 0.1 12/11/2012 1249   BASOSABS 0.0 04/19/2015 2250   BASOSABS 0.1 12/11/2012 1249     Probably viral enteritis.  Stool studies are pending. Will continue to monitor.

## 2015-04-21 NOTE — Progress Notes (Signed)
[facility]                                                                              Patient Demographics  Calvin Cruz, is a 55 y.o. male, DOB - 1960/08/20, [id number]  Admit date - 04/19/2015   Admitting Physician Orvan Falconer, MD  Outpatient Primary MD for the patient is Sheela Stack, MD  LOS - 1   Chief Complaint  Patient presents with  . Emesis       Brief HPI   Calvin Cruz is an 55 y.o. male with hx of UC, s/p colectomy with Hartman's pouch formation, hx of gout, NSTEMI, CAD s/p DES June 2016, HTN, HLD, seen earlier today for abdominal pain, nausea and vomiting, returned to the ER as nausea and vomiting persisted. He has no fever or chills, but did have URI symptoms, and took one Augmentin yesterday. He has some ill contact, but not definite. His wife was not ill. In the ER his WBC was 5.9K, though was 14K before, and his abdominal CT showed focal inflammation c/w enterocolitis.    Assessment & Plan    Principal Problem: Enteritis with underlying history of ulcerative colitis, status post colectomy with Hartman's pouch -Ct ab on admission: 1. Postoperative changes of total colectomy and Hartman's pouch formation. 2. There are mild inflammatory changes involving the mildly dilated right lower quadrant small bowel loops with surrounding fat stranding and free fluid. Findings compatible with acute enteritis. No evidence for perforation or abscess formation. No high-grade obstruction 3. Aortic atherosclerosis - Continue IV fluid hydration, clear liquid diet, continue IV Unasyn - GI consulted - Stool studies including C. difficile, fecal lactoferrin, GI pathogen panel - Continue IV pain control, antiemetics, still significant symptom, start trial of steroids on 3/2, also started on ppi iv bid on 3/2.  Active Problems:      Hyperlipidemia with target LDL less than 70 -Continue statin     CAD S/P DES PCI-circumflex - Continue  aspirin, brilinta  Diabetes mellitus - Per patient's wife, hemoglobin A1c was checked last Friday and was 7.2, hence will not order. His endocrinologist is Dr. Forde Dandy. -Sliding scale Insulin for now, change ivf from d5 to NS/20K for another day   Code Status: Full Code    Family Communication: Discussed in detail with the patient, all imaging results, lab results explained to the patient and wife at the bedside   Disposition Plan: remain in the hospital  Time Spent in minutes  25  minutes  Procedures  CT Abdomen and pelvis   Consults   GI   DVT Prophylaxis  SCD's  Medications  Scheduled Meds: . acidophilus  2 capsule Oral Daily  . ampicillin-sulbactam (UNASYN) IV  1.5 g Intravenous Q6H  . aspirin EC  81 mg Oral Daily  . ezetimibe  10 mg Oral Daily  . insulin aspart  0-5 Units Subcutaneous QHS  . insulin aspart  0-9 Units Subcutaneous TID WC  . lisinopril  2.5 mg Oral Daily  . methylPREDNISolone (SOLU-MEDROL) injection  60 mg Intravenous Q24H  . metoprolol tartrate  25 mg Oral BID  . omega-3 acid ethyl esters  2 g Oral BID  . pantoprazole (PROTONIX) IV  40 mg Intravenous Q12H  . rosuvastatin  40 mg Oral Daily  . ticagrelor  90 mg Oral BID   Continuous Infusions: . 0.9 % NaCl with KCl 20 mEq / L     PRN Meds:.acetaminophen, HYDROmorphone (DILAUDID) injection, ondansetron **OR** ondansetron (ZOFRAN) IV, prochlorperazine   Antibiotics   Anti-infectives    Start     Dose/Rate Route Frequency Ordered Stop   04/20/15 0230  ampicillin-sulbactam (UNASYN) 1.5 g in sodium chloride 0.9 % 50 mL IVPB     1.5 g 100 mL/hr over 30 Minutes Intravenous Every 6 hours 04/20/15 0226          Subjective:   Calvin Cruz was seen and examined today. Patient, still significant nausea, no vomiting, diarrhea seems slowed down. Persistent diffuse Abdominal pain 6-7/10  Patient denies dizziness, chest pain, shortness of breath, new weakness, numbess, tingling. No acute events  overnight.  Low-grade temperature.   Objective:   Filed Vitals:   04/20/15 0300 04/20/15 1559 04/20/15 2203 04/21/15 0447  BP: 129/81 123/67 131/82 122/76  Pulse: 96 88 86 82  Temp: 99.1 F (37.3 C) 98.7 F (37.1 C) 99.1 F (37.3 C) 98.5 F (36.9 C)  TempSrc: Oral  Oral Oral  Resp:  20 20 20   Height: 6\' 6"  (1.981 m)     Weight: 106.9 kg (235 lb 10.8 oz)     SpO2: 94% 94% 94% 95%    Intake/Output Summary (Last 24 hours) at 04/21/15 1138 Last data filed at 04/20/15 1700  Gross per 24 hour  Intake    720 ml  Output      0 ml  Net    720 ml     Wt Readings from Last 3 Encounters:  04/20/15 106.9 kg (235 lb 10.8 oz)  04/19/15 108.41 kg (239 lb)  11/17/14 107.82 kg (237 lb 11.2 oz)     Exam  General: Alert and oriented x 3,uncomfortable  HEENT:  PERRLA, EOMI, Anicteric Sclera, mucous membranes moist.   Neck: Supple, no JVD, no masses  CVS: S1 S2 auscultated, no rubs, murmurs or gallops. Regular rate and rhythm.  Respiratory: Clear to auscultation bilaterally, no wheezing, rales or rhonchi  Abdomen: Soft,  diffuse tenderness , no rebound,  nondistended, + bowel sounds  Ext: no cyanosis clubbing or edema  Neuro: AAOx3, Cr N's II- XII. Strength 5/5 upper and lower extremities bilaterally  Skin: No rashes  Psych: Normal affect and demeanor, alert and oriented x3    Data Review   Micro Results Recent Results (from the past 240 hour(s))  C difficile quick scan w PCR reflex     Status: None   Collection Time: 04/20/15  4:40 PM  Result Value Ref Range Status   C Diff antigen NEGATIVE NEGATIVE Final   C Diff toxin NEGATIVE NEGATIVE Final   C Diff interpretation Negative for toxigenic C. difficile  Final    Radiology Reports Dg Chest 2 View  04/19/2015  CLINICAL DATA:  Chest pain and cough beginning yesterday. Chest pressure when coughing. EXAM: CHEST  2 VIEW COMPARISON:  Single view of the chest 07/22/2014. FINDINGS: The lungs are clear. Heart size is normal.  No pneumothorax or pleural effusion. Azygos fissure is noted. Thoracic spondylosis also seen. IMPRESSION: No acute disease. Electronically Signed   By: Inge Rise M.D.   On: 04/19/2015 11:00   Ct Abdomen Pelvis W Contrast  04/19/2015  CLINICAL DATA:  Low pelvic pain  for 1.5 days. EXAM: CT ABDOMEN AND PELVIS WITH CONTRAST TECHNIQUE: Multidetector CT imaging of the abdomen and pelvis was performed using the standard protocol following bolus administration of intravenous contrast. CONTRAST:  179mL OMNIPAQUE IOHEXOL 300 MG/ML  SOLN COMPARISON:  03/21/07 FINDINGS: Lower chest: There is no pleural effusion identified the lung bases are clear. Hepatobiliary: There is no focal liver abnormality. Previous cholecystectomy. Pancreas: Normal appearance of the pancreas. Spleen: Negative Adrenals/Urinary Tract: The adrenal glands are normal. Unremarkable appearance of the kidneys. The urinary bladder appears within normal limits. Stomach/Bowel: The stomach appears normal the patient is status post total collect. A Hartman's pouch is identified within the pelvis. The small bowel loops are mildly increased in caliber measuring up to 3.5 cm, image number 49 of series 4. Mild small bowel wall enhancement with surrounding inflammation. There is a small amount of free fluid within the white lower quadrant of the abdomen surrounding the small bowel loops, for example image 34 series 4. No abscess identified. Vascular/Lymphatic: Calcified atherosclerotic disease involves the abdominal aorta. No aneurysm. No enlarged retroperitoneal or mesenteric adenopathy. No enlarged pelvic or inguinal lymph nodes. Reproductive: Prostate gland and seminal vesicles are unremarkable. Other: There is no ascites or focal fluid collections within the abdomen or pelvis. Musculoskeletal: The visualized bony structures are remarkable for degenerative disc disease within the lower thoracic spine. IMPRESSION: 1. Postoperative changes of total colectomy  and Hartman's pouch formation. 2. There are mild inflammatory changes involving the mildly dilated right lower quadrant small bowel loops with surrounding fat stranding and free fluid. Findings compatible with acute enteritis. No evidence for perforation or abscess formation. No high-grade obstruction 3. Aortic atherosclerosis Electronically Signed   By: Kerby Moors M.D.   On: 04/19/2015 14:32    CBC  Recent Labs Lab 04/19/15 1030 04/19/15 2250 04/21/15 0610  WBC 14.1* 5.9 4.4  HGB 17.1* 16.0 13.9  HCT 47.6 45.1 39.7  PLT 253 205 208  MCV 89.0 88.8 88.8  MCH 32.0 31.5 31.1  MCHC 35.9 35.5 35.0  RDW 12.5 12.4 12.2  LYMPHSABS 3.4 1.0  --   MONOABS 1.0 0.8  --   EOSABS 0.1 0.0  --   BASOSABS 0.1 0.0  --     Chemistries   Recent Labs Lab 04/19/15 1030 04/19/15 2250 04/21/15 0610  NA 138 138 136  K 4.0 4.0 3.9  CL 103 104 103  CO2 23 21* 25  GLUCOSE 195* 216* 225*  BUN 15 15 11   CREATININE 0.64 0.79 0.61  CALCIUM 9.7 9.3 8.4*  AST 19 19  --   ALT 20 17  --   ALKPHOS 69 57  --   BILITOT 0.9 1.7*  --    ------------------------------------------------------------------------------------------------------------------ estimated creatinine clearance is 133.3 mL/min (by C-G formula based on Cr of 0.61). ------------------------------------------------------------------------------------------------------------------ No results for input(s): HGBA1C in the last 72 hours. ------------------------------------------------------------------------------------------------------------------ No results for input(s): CHOL, HDL, LDLCALC, TRIG, CHOLHDL, LDLDIRECT in the last 72 hours. ------------------------------------------------------------------------------------------------------------------ No results for input(s): TSH, T4TOTAL, T3FREE, THYROIDAB in the last 72 hours.  Invalid input(s):  FREET3 ------------------------------------------------------------------------------------------------------------------ No results for input(s): VITAMINB12, FOLATE, FERRITIN, TIBC, IRON, RETICCTPCT in the last 72 hours.  Coagulation profile No results for input(s): INR, PROTIME in the last 168 hours.  No results for input(s): DDIMER in the last 72 hours.  Cardiac Enzymes  Recent Labs Lab 04/19/15 1030  TROPONINI <0.03   ------------------------------------------------------------------------------------------------------------------ Invalid input(s): [place]   Recent Labs  04/20/15 1219 04/20/15 1725 04/20/15 2158 04/20/15 2200 04/21/15  [place] M.D. PhD [facility] 04/21/2015, 11:38 AM  Pager: [phone] After 7pm go to [email or web address] - password [email or web address]  Call night coverage person covering after 7pm

## 2015-04-22 ENCOUNTER — Inpatient Hospital Stay (HOSPITAL_COMMUNITY): Payer: 59

## 2015-04-22 LAB — MAGNESIUM: MAGNESIUM: 1.8 mg/dL (ref 1.7–2.4)

## 2015-04-22 LAB — BASIC METABOLIC PANEL
Anion gap: 8 (ref 5–15)
BUN: 10 mg/dL (ref 6–20)
CHLORIDE: 102 mmol/L (ref 101–111)
CO2: 28 mmol/L (ref 22–32)
CREATININE: 0.58 mg/dL — AB (ref 0.61–1.24)
Calcium: 8.8 mg/dL — ABNORMAL LOW (ref 8.9–10.3)
GFR calc Af Amer: >60 mL/min (ref >60)
Glucose, Bld: 156 mg/dL — ABNORMAL HIGH (ref 65–99)
Potassium: 3.9 mmol/L (ref 3.5–5.1)
SODIUM: 138 mmol/L (ref 135–145)

## 2015-04-22 LAB — CBC WITH DIFFERENTIAL/PLATELET
Basophils Absolute: 0 10*3/uL (ref 0.0–0.1)
Basophils Relative: 0 %
Eosinophils Absolute: 0 10*3/uL (ref 0.0–0.7)
Eosinophils Relative: 0 %
HEMATOCRIT: 38.9 % — AB (ref 39.0–52.0)
HEMOGLOBIN: 13.6 g/dL (ref 13.0–17.0)
LYMPHS ABS: 1.6 10*3/uL (ref 0.7–4.0)
Lymphocytes Relative: 27 %
MCH: 30.8 pg (ref 26.0–34.0)
MCHC: 35 g/dL (ref 30.0–36.0)
MCV: 88 fL (ref 78.0–100.0)
Monocytes Absolute: 1.2 10*3/uL — ABNORMAL HIGH (ref 0.1–1.0)
Monocytes Relative: 21 %
Neutro Abs: 3.1 10*3/uL (ref 1.7–7.7)
Neutrophils Relative %: 52 %
Platelets: 223 10*3/uL (ref 150–400)
RBC: 4.42 MIL/uL (ref 4.22–5.81)
RDW: 12 % (ref 11.5–15.5)
WBC: 5.8 10*3/uL (ref 4.0–10.5)

## 2015-04-22 LAB — HEPATIC FUNCTION PANEL
ALT: 12 U/L — AB (ref 17–63)
AST: 11 U/L — ABNORMAL LOW (ref 15–41)
Albumin: 3.2 g/dL — ABNORMAL LOW (ref 3.5–5.0)
Alkaline Phosphatase: 44 U/L (ref 38–126)
BILIRUBIN DIRECT: 0.1 mg/dL (ref 0.1–0.5)
BILIRUBIN INDIRECT: 0.6 mg/dL (ref 0.3–0.9)
Total Bilirubin: 0.7 mg/dL (ref 0.3–1.2)
Total Protein: 6.5 g/dL (ref 6.5–8.1)

## 2015-04-22 LAB — GLUCOSE, CAPILLARY
GLUCOSE-CAPILLARY: 166 mg/dL — AB (ref 65–99)
GLUCOSE-CAPILLARY: 185 mg/dL — AB (ref 65–99)
GLUCOSE-CAPILLARY: 193 mg/dL — AB (ref 65–99)
Glucose-Capillary: 149 mg/dL — ABNORMAL HIGH (ref 65–99)

## 2015-04-22 LAB — FECAL LACTOFERRIN, QUANT: Fecal Lactoferrin: DETECTED

## 2015-04-22 LAB — SEDIMENTATION RATE: Sed Rate: 60 mm/hr — ABNORMAL HIGH (ref 0–16)

## 2015-04-22 LAB — C-REACTIVE PROTEIN: CRP: 5.5 mg/dL — ABNORMAL HIGH (ref <1.0)

## 2015-04-22 MED ORDER — HYDROMORPHONE HCL 1 MG/ML IJ SOLN
1.0000 mg | Freq: Once | INTRAMUSCULAR | Status: AC
  Administered 2015-04-22: 1 mg via INTRAVENOUS

## 2015-04-22 MED ORDER — SODIUM CHLORIDE 0.9 % IV SOLN
INTRAVENOUS | Status: DC
  Administered 2015-04-22: 1 mL via INTRAVENOUS
  Administered 2015-04-23: 01:00:00 via INTRAVENOUS

## 2015-04-22 MED ORDER — LORAZEPAM 2 MG/ML IJ SOLN
INTRAMUSCULAR | Status: AC
  Administered 2015-04-22: 1 mg
  Filled 2015-04-22: qty 1

## 2015-04-22 MED ORDER — LORAZEPAM 2 MG/ML IJ SOLN
1.0000 mg | Freq: Four times a day (QID) | INTRAMUSCULAR | Status: DC | PRN
  Filled 2015-04-22: qty 1

## 2015-04-22 MED ORDER — LORAZEPAM 2 MG/ML IJ SOLN
2.0000 mg | Freq: Once | INTRAMUSCULAR | Status: AC
  Administered 2015-04-22: 2 mg via INTRAVENOUS

## 2015-04-22 NOTE — Progress Notes (Signed)
NG tube placed for decompression. AAS in am. Lorazepam 1 mg IV every 6 hours as need.   Dr. Oneida Alar will be rounding over the weekend.

## 2015-04-22 NOTE — Progress Notes (Signed)
[facility]                                                                              Patient Demographics  Calvin Cruz, is a 55 y.o. male, DOB - Jul 20, 1960, [id number]  Admit date - 04/19/2015   Admitting Physician Orvan Falconer, MD  Outpatient Primary MD for the patient is Sheela Stack, MD  LOS - 2   Chief Complaint  Patient presents with  . Emesis       Brief HPI   Calvin Cruz is an 55 y.o. male with hx of UC, s/p colectomy with Hartman's pouch formation, hx of gout, NSTEMI, CAD s/p DES June 2016, HTN, HLD, seen earlier today for abdominal pain, nausea and vomiting, returned to the ER as nausea and vomiting persisted. He has no fever or chills, but did have URI symptoms, and took one Augmentin yesterday. He has some ill contact, but not definite. His wife was not ill. In the ER his WBC was 5.9K, though was 14K before, and his abdominal CT showed focal inflammation c/w enterocolitis.    Assessment & Plan    Principal Problem: Enteritis with underlying history of ulcerative colitis, status post colectomy with Hartman's pouch - Per patient, no significant improvement, continues to complain of nausea, abdominal pain, liquid stools. - Continue IV fluid hydration, NPO, IV unasyn - GI consulted, recommended acute abdominal series, results consistent with small bowel obstruction, surgery consulted. Per patient, attempts in the past for NG tube placement has failed. - C. Difficile negative, fecal lactoferrin, GI pathogen panel negative  - Continue IV pain control, antiemetics  Active Problems:      Hyperlipidemia with target LDL less than 70 - On statin     CAD S/P DES PCI-circumflex - Continue aspirin, brilinta  Diabetes mellitus - Per patient's wife, hemoglobin A1c was checked on 2/24 and was 7.2, hence will not order. His endocrinologist is Dr. Forde Dandy. - Sliding scale Insulin for now   Code Status: Full Code    Family Communication:  Discussed in detail with the patient, all imaging results, lab results explained to the patient and wife at the bedside   Disposition Plan:   Time Spent in minutes  25  minutes  Procedures  CT Abdomen and pelvis   Consults   GI  surgery  DVT Prophylaxis  SCD's  Medications  Scheduled Meds: . acidophilus  2 capsule Oral Daily  . ampicillin-sulbactam (UNASYN) IV  1.5 g Intravenous Q6H  . aspirin EC  81 mg Oral Daily  . ezetimibe  10 mg Oral Daily  . insulin aspart  0-5 Units Subcutaneous QHS  . insulin aspart  0-9 Units Subcutaneous TID WC  . lisinopril  2.5 mg Oral Daily  . methylPREDNISolone (SOLU-MEDROL) injection  60 mg Intravenous Q24H  . metoprolol tartrate  25 mg Oral BID  . omega-3 acid ethyl esters  2 g Oral BID  . pantoprazole (PROTONIX) IV  40 mg Intravenous Q12H  . rosuvastatin  40 mg Oral Daily  . simethicone  160 mg Oral QID  . ticagrelor  90 mg Oral BID   Continuous Infusions:  PRN Meds:.acetaminophen, HYDROmorphone (DILAUDID) injection, ondansetron **OR** ondansetron (ZOFRAN) IV, prochlorperazine   Antibiotics   Anti-infectives    Start     Dose/Rate Route Frequency Ordered Stop   04/20/15 0230  ampicillin-sulbactam (UNASYN) 1.5 g in sodium chloride 0.9 % 50 mL IVPB     1.5 g 100 mL/hr over 30 Minutes Intravenous Every 6 hours 04/20/15 0226          Subjective:   Calvin Cruz was seen and examined today. Continues to have nausea, abdominal pain. 9/10, no vomiting..  Patient denies dizziness, chest pain, shortness of breath, new weakness, numbess, tingling. No acute events overnight. Diarrhea, x 1 this AM  Objective:   Filed Vitals:   04/21/15 1518 04/21/15 2018 04/21/15 2033 04/22/15 0431  BP: 114/69 134/74  137/82  Pulse: 91 85  78  Temp: 98.3 F (36.8 C) 98.3 F (36.8 C)  98.2 F (36.8 C)  TempSrc:  Oral  Oral  Resp: 20 18  18   Height:      Weight:      SpO2: 96% 92% 93% 97%    Intake/Output Summary (Last 24 hours) at 04/22/15  1300 Last data filed at 04/22/15 0900  Gross per 24 hour  Intake 443.75 ml  Output   1400 ml  Net -956.25 ml     Wt Readings from Last 3 Encounters:  04/20/15 106.9 kg (235 lb 10.8 oz)  04/19/15 108.41 kg (239 lb)  11/17/14 107.82 kg (237 lb 11.2 oz)     Exam  General: Alert and oriented x 3, uncomfortable  HEENT:  PERRLA, EOMI  Neck: Supple, no JVD,  CVS: S1 S2 auscultated, no rubs, murmurs or gallops. Regular rate and rhythm.  Respiratory: CTAB, no wheezing    Abdomen: Soft,  diffuse tenderness , distended, + bowel sounds  Ext: no cyanosis clubbing or edema  Neuro: no new deficits  Skin: No rashes  Psych: Normal affect and demeanor, alert and oriented x3    Data Review   Micro Results Recent Results (from the past 240 hour(s))  Gastrointestinal Panel by PCR , Stool     Status: None   Collection Time: 04/20/15  4:40 PM  Result Value Ref Range Status   Campylobacter species NOT DETECTED NOT DETECTED Final   Plesimonas shigelloides NOT DETECTED NOT DETECTED Final   Salmonella species NOT DETECTED NOT DETECTED Final   Yersinia enterocolitica NOT DETECTED NOT DETECTED Final   Vibrio species NOT DETECTED NOT DETECTED Final   Vibrio cholerae NOT DETECTED NOT DETECTED Final   Enteroaggregative E coli (EAEC) NOT DETECTED NOT DETECTED Final   Enteropathogenic E coli (EPEC) NOT DETECTED NOT DETECTED Final   Enterotoxigenic E coli (ETEC) NOT DETECTED NOT DETECTED Final   Shiga like toxin producing E coli (STEC) NOT DETECTED NOT DETECTED Final   E. coli O157 NOT DETECTED NOT DETECTED Final   Shigella/Enteroinvasive E coli (EIEC) NOT DETECTED NOT DETECTED Final   Cryptosporidium NOT DETECTED NOT DETECTED Final   Cyclospora cayetanensis NOT DETECTED NOT DETECTED Final   Entamoeba histolytica NOT DETECTED NOT DETECTED Final   Giardia lamblia NOT DETECTED NOT DETECTED Final   Adenovirus F40/41 NOT DETECTED NOT DETECTED Final   Astrovirus NOT DETECTED NOT DETECTED Final    Norovirus GI/GII NOT DETECTED NOT DETECTED Final   Rotavirus A NOT DETECTED NOT DETECTED Final   Sapovirus (I, II, IV, and V) NOT DETECTED NOT DETECTED Final  C difficile quick scan w PCR reflex     Status: None  Collection Time: 04/20/15  4:40 PM  Result Value Ref Range Status   C Diff antigen NEGATIVE NEGATIVE Final   C Diff toxin NEGATIVE NEGATIVE Final   C Diff interpretation Negative for toxigenic C. difficile  Final    Radiology Reports Dg Chest 2 View  04/19/2015  CLINICAL DATA:  Chest pain and cough beginning yesterday. Chest pressure when coughing. EXAM: CHEST  2 VIEW COMPARISON:  Single view of the chest 07/22/2014. FINDINGS: The lungs are clear. Heart size is normal. No pneumothorax or pleural effusion. Azygos fissure is noted. Thoracic spondylosis also seen. IMPRESSION: No acute disease. Electronically Signed   By: Inge Rise M.D.   On: 04/19/2015 11:00   Ct Abdomen Pelvis W Contrast  04/19/2015  CLINICAL DATA:  Low pelvic pain for 1.5 days. EXAM: CT ABDOMEN AND PELVIS WITH CONTRAST TECHNIQUE: Multidetector CT imaging of the abdomen and pelvis was performed using the standard protocol following bolus administration of intravenous contrast. CONTRAST:  174mL OMNIPAQUE IOHEXOL 300 MG/ML  SOLN COMPARISON:  03/21/07 FINDINGS: Lower chest: There is no pleural effusion identified the lung bases are clear. Hepatobiliary: There is no focal liver abnormality. Previous cholecystectomy. Pancreas: Normal appearance of the pancreas. Spleen: Negative Adrenals/Urinary Tract: The adrenal glands are normal. Unremarkable appearance of the kidneys. The urinary bladder appears within normal limits. Stomach/Bowel: The stomach appears normal the patient is status post total collect. A Hartman's pouch is identified within the pelvis. The small bowel loops are mildly increased in caliber measuring up to 3.5 cm, image number 49 of series 4. Mild small bowel wall enhancement with surrounding inflammation.  There is a small amount of free fluid within the white lower quadrant of the abdomen surrounding the small bowel loops, for example image 34 series 4. No abscess identified. Vascular/Lymphatic: Calcified atherosclerotic disease involves the abdominal aorta. No aneurysm. No enlarged retroperitoneal or mesenteric adenopathy. No enlarged pelvic or inguinal lymph nodes. Reproductive: Prostate gland and seminal vesicles are unremarkable. Other: There is no ascites or focal fluid collections within the abdomen or pelvis. Musculoskeletal: The visualized bony structures are remarkable for degenerative disc disease within the lower thoracic spine. IMPRESSION: 1. Postoperative changes of total colectomy and Hartman's pouch formation. 2. There are mild inflammatory changes involving the mildly dilated right lower quadrant small bowel loops with surrounding fat stranding and free fluid. Findings compatible with acute enteritis. No evidence for perforation or abscess formation. No high-grade obstruction 3. Aortic atherosclerosis Electronically Signed   By: Kerby Moors M.D.   On: 04/19/2015 14:32   Dg Abd Acute W/chest  04/22/2015  CLINICAL DATA:  Acute abdominal pain, nausea and vomiting. EXAM: DG ABDOMEN ACUTE W/ 1V CHEST COMPARISON:  None. FINDINGS: Dilated small bowel loops with air-fluid levels are noted concerning for distal small bowel obstruction. Surgical sutures are noted in the pelvis. Status post cholecystectomy. No acute cardiopulmonary abnormality is seen. IMPRESSION: Dilated small bowel loops with air-fluid levels are noted concerning for distal small bowel obstruction. Continued radiographic follow-up is recommended. Electronically Signed   By: Marijo Conception, M.D.   On: 04/22/2015 10:34    CBC  Recent Labs Lab 04/19/15 1030 04/19/15 2250 04/21/15 0610 04/22/15 0653  WBC 14.1* 5.9 4.4 5.8  HGB 17.1* 16.0 13.9 13.6  HCT 47.6 45.1 39.7 38.9*  PLT 253 205 208 223  MCV 89.0 88.8 88.8 88.0  MCH  32.0 31.5 31.1 30.8  MCHC 35.9 35.5 35.0 35.0  RDW 12.5 12.4 12.2 12.0  LYMPHSABS 3.4 1.0  --  1.6  MONOABS 1.0 0.8  --  1.2*  EOSABS 0.1 0.0  --  0.0  BASOSABS 0.1 0.0  --  0.0    Chemistries   Recent Labs Lab 04/19/15 1030 04/19/15 2250 04/21/15 0610 04/22/15 0653  NA 138 138 136 138  K 4.0 4.0 3.9 3.9  CL 103 104 103 102  CO2 23 21* 25 28  GLUCOSE 195* 216* 225* 156*  BUN 15 15 11 10   CREATININE 0.64 0.79 0.61 0.58*  CALCIUM 9.7 9.3 8.4* 8.8*  MG  --   --   --  1.8  AST 19 19  --  11*  ALT 20 17  --  12*  ALKPHOS 69 57  --  44  BILITOT 0.9 1.7*  --  0.7   ------------------------------------------------------------------------------------------------------------------ estimated creatinine clearance is 133.3 mL/min (by C-G formula based on Cr of 0.58). ------------------------------------------------------------------------------------------------------------------ No results for input(s): HGBA1C in the last 72 hours. ------------------------------------------------------------------------------------------------------------------ No results for input(s): CHOL, HDL, LDLCALC, TRIG, CHOLHDL, LDLDIRECT in the last 72 hours. ------------------------------------------------------------------------------------------------------------------ No results for input(s): TSH, T4TOTAL, T3FREE, THYROIDAB in the last 72 hours.  Invalid input(s): FREET3 ------------------------------------------------------------------------------------------------------------------ No results for input(s): VITAMINB12, FOLATE, FERRITIN, TIBC, IRON, RETICCTPCT in the last 72 hours.  Coagulation profile No results for input(s): INR, PROTIME in the last 168 hours.  No results for input(s): DDIMER in the last 72 hours.  Cardiac Enzymes  Recent Labs Lab 04/19/15 1030  TROPONINI <0.03    ------------------------------------------------------------------------------------------------------------------ Invalid input(s): POCBNP   Recent Labs  04/21/15 0724 04/21/15 1134 04/21/15 1647 04/21/15 2021 04/22/15 0716 04/22/15 1124  GLUCAP 214* 200* 227* 216* 149* 166*     Kalem Rockwell M.D. [facility] 04/22/2015, 1:00 PM  Pager: [phone] Between 7am to 7pm - call Pager - [phone]  After 7pm go to [email or web address] - password [email or web address]  Call night coverage person covering after 7pm

## 2015-04-22 NOTE — Progress Notes (Signed)
  Subjective:  Patient complains of nausea and abdominal pain she describes as 10 out of 10. He is passing liquid stools but no flatus.  Objective: Blood pressure 137/82, pulse 78, temperature 98.2 F (36.8 C), temperature source Oral, resp. rate 18, height 6\' 6"  (1.981 m), weight 235 lb 10.8 oz (106.9 kg), SpO2 97 %. Patient appears to be in distress Abdomen is distended. Bowel sounds are present. Abdomen is soft with generalized tenderness which is more across upper abdomen. No LE edema or clubbing noted.  Labs/studies Results:   Recent Labs  04/19/15 2250 04/21/15 0610 04/22/15 0653  WBC 5.9 4.4 5.8  HGB 16.0 13.9 13.6  HCT 45.1 39.7 38.9*  PLT 205 208 223    BMET   Recent Labs  04/19/15 2250 04/21/15 0610 04/22/15 0653  NA 138 136 138  K 4.0 3.9 3.9  CL 104 103 102  CO2 21* 25 28  GLUCOSE 216* 225* 156*  BUN 15 11 10   CREATININE 0.79 0.61 0.58*  CALCIUM 9.3 8.4* 8.8*    LFT   Recent Labs  04/19/15 1030 04/19/15 2250 04/22/15 0653  PROT 8.2* 7.8 6.5  ALBUMIN 4.7 4.4 3.2*  AST 19 19 11*  ALT 20 17 12*  ALKPHOS 69 57 44  BILITOT 0.9 1.7* 0.7  BILIDIR  --   --  0.1  IBILI  --   --  0.6      Assessment: Acute illness felt to be due to enteritis. Stool studies are negative. He is on empiric therapy with IV antibiotic. He is not improving. He is behaving like he has small bowel obstruction. A shunt reluctant to have NG tube placement. He states attempts in the past have always failed. Will obtain acute abdominal series. If he has obstruction due to anastomotic stricture he may benefit from a sigmoidoscopy the Mariel Kansky will need surgical consultation.  Recommendations: Acute abdominal series. Increase IV Fluid rate to 150 ml/hr

## 2015-04-22 NOTE — Progress Notes (Signed)
Dr. Laural Golden to room to insert NG tube.  Verbal order given for Ativan IV, gave 1mg  IV, Rehman attempted NG, patient gaging, Rehman ordered another 1mg  Ativan IV, then NG inserted, secured, Rehman used syringe to remove air from patient's stomach.  Connected to low wall suction.  Will continue to monitor, wife at bedside.

## 2015-04-22 NOTE — Progress Notes (Signed)
Per wife, patient passed large amount green colored stool, wife flushed, unable to assess.  Continues to request waiting for NG.

## 2015-04-22 NOTE — Progress Notes (Signed)
Per Dr. Laural Golden, attempt placement of NG tube.  Patient requested waiting later.  Will attempt when patient more awake and ready.  Wife in room and hesitant.

## 2015-04-23 ENCOUNTER — Inpatient Hospital Stay (HOSPITAL_COMMUNITY): Payer: 59

## 2015-04-23 DIAGNOSIS — K51919 Ulcerative colitis, unspecified with unspecified complications: Secondary | ICD-10-CM

## 2015-04-23 DIAGNOSIS — E785 Hyperlipidemia, unspecified: Secondary | ICD-10-CM

## 2015-04-23 DIAGNOSIS — K529 Noninfective gastroenteritis and colitis, unspecified: Secondary | ICD-10-CM

## 2015-04-23 DIAGNOSIS — K5669 Other intestinal obstruction: Secondary | ICD-10-CM

## 2015-04-23 LAB — GLUCOSE, CAPILLARY
GLUCOSE-CAPILLARY: 175 mg/dL — AB (ref 65–99)
GLUCOSE-CAPILLARY: 166 mg/dL — AB (ref 65–99)
Glucose-Capillary: 152 mg/dL — ABNORMAL HIGH (ref 65–99)

## 2015-04-23 LAB — HEMOGLOBIN A1C
HEMOGLOBIN A1C: 7.1 % — AB (ref 4.8–5.6)
MEAN PLASMA GLUCOSE: 157 mg/dL

## 2015-04-23 MED ORDER — KCL IN DEXTROSE-NACL 20-5-0.45 MEQ/L-%-% IV SOLN
INTRAVENOUS | Status: DC
  Administered 2015-04-23 – 2015-04-24 (×2): via INTRAVENOUS
  Administered 2015-04-24: 1 mL via INTRAVENOUS

## 2015-04-23 MED ORDER — MENTHOL 3 MG MT LOZG
1.0000 | LOZENGE | OROMUCOSAL | Status: DC | PRN
  Filled 2015-04-23: qty 9

## 2015-04-23 NOTE — Progress Notes (Signed)
Patient ID: Calvin Cruz, male   DOB: 29-Aug-1960, 55 y.o.   MRN: [id number]   Assessment/Plan: ADMITTED WITH PARTIAL SMALL BOWEL OBSTRUCTION. CLINICALLY IMPROVED.  PLAN: 1. ADVANCE DIET AS TOLERATED 2. SUPPORTIVE CARE   Subjective: Since LAST EVALUATION, CONTINUES TO IMPROVE. MILD NAUSEA. NO VOMITING. PASSING GAS AND LOOSE STOOLS. NO VOMITING. NG TUBE IS OUT. WAITING TO START CLEAR LIQUIDS.  Objective: Vital signs in last 24 hours: Filed Vitals:   04/22/15 2150 04/23/15 0450  BP: 133/82 130/88  Pulse: 85 84  Temp: 97.7 F (36.5 C) 98.7 F (37.1 C)  Resp: 20 20   General appearance: alert, cooperative and no distress Resp: clear to auscultation bilaterally Cardio: regular rate and rhythm GI: soft, MILDLY TENDER x 4; TYMPANIC TO PERCUSSION, MILD DISTENTION, bowel sounds normal;  Extremities: extremities normal, atraumatic, no cyanosis or edema  Lab Results: NONE   Studies/Results: No results found.  Medications: I have reviewed the patient's current medications.   LOS: 5 days   Barney Drain 07/30/2013, 2:23 PM

## 2015-04-23 NOTE — Plan of Care (Signed)
Problem: Education: Goal: Knowledge of  General Education information/materials will improve Outcome: Progressing Pt informed on policies for safety. Explained to pt that he has a NG Tube and IV going and needs to call staff when he needs to use the restroom. Staff used BSC and urinal to toilet pt. Reviewed plan of care with pt and family who makes informed decisions about the care of the patient. Pt verbalized understanding.   Problem: Safety: Goal: Ability to remain free from injury will improve Outcome: Progressing Pt is alert and oriented. Wife at bedside. Call bell within reach and pt informed to call staff when he needs to get up. Pt informed about the various tubes he is connected to. Pt verbalized understanding.   Problem: Pain Managment: Goal: General experience of comfort will improve Outcome: Progressing Pt has been requiring pain medication due to pains of 10. He states the pain medication eases off the pain.   Problem: Physical Regulation: Goal: Ability to maintain clinical measurements within normal limits will improve Outcome: Progressing See flowsheet.  Problem: Bowel/Gastric: Goal: Will not experience complications related to bowel motility Outcome: Progressing NG Tube in place.

## 2015-04-23 NOTE — Progress Notes (Signed)
[facility]                                                                              Patient Demographics  Calvin Cruz, is a 55 y.o. male, DOB - March 16, 1960, [id number]  Admit date - 04/19/2015   Admitting Physician Orvan Falconer, MD  Outpatient Primary MD for the patient is Sheela Stack, MD  LOS - 3   Chief Complaint  Patient presents with  . Emesis       Brief HPI   Calvin Cruz is an 55 y.o. male with hx of UC, s/p colectomy with Hartman's pouch formation, hx of gout, NSTEMI, CAD s/p DES June 2016, HTN, HLD, seen earlier today for abdominal pain, nausea and vomiting, returned to the ER as nausea and vomiting persisted. He has no fever or chills, but did have URI symptoms, and took one Augmentin yesterday. He has some ill contact, but not definite. His wife was not ill. In the ER his WBC was 5.9K, though was 14K before, and his abdominal CT showed focal inflammation c/w enterocolitis.    Assessment & Plan    Principal Problem: Enteritis with underlying history of ulcerative colitis, status post colectomy with Hartman's pouch - appears to be slowly improving. - Continue IV fluid hydration,  IV unasyn - GI following - C. Difficile negative, fecal lactoferrin, GI pathogen panel negative  - Continue IV pain control, antiemetics  Active Problems:   Partial small bowel obstruction - Gen surgery following - Had NG tube placed on 3/3 with some improvement in abdominal distention and pain - Patient removed NG tube on 3/4 - He is having bowel movements and says he is passing flatus - He was encouraged to ambulate - currently on clear liquids      Hyperlipidemia with target LDL less than 70 - On statin     CAD S/P DES PCI-circumflex - Continue aspirin. Brilinta currently on hold since he may require surgical intervention. Would resume brilinta on discharge  Diabetes mellitus - Per patient's wife, hemoglobin A1c was checked on 2/24 and  was 7.2, hence will not order. His endocrinologist is Dr. Forde Dandy. - Sliding scale Insulin for now   Code Status: Full Code    Family Communication: Discussed in detail with the patient, all imaging results, lab results explained to the patient and wife at the bedside   Disposition Plan:   Time Spent in minutes  25  minutes  Procedures  CT Abdomen and pelvis   Consults   GI  Gen surgery  DVT Prophylaxis  SCD's  Medications  Scheduled Meds: . ampicillin-sulbactam (UNASYN) IV  1.5 g Intravenous Q6H  . aspirin EC  81 mg Oral Daily  . ezetimibe  10 mg Oral Daily  . insulin aspart  0-5 Units Subcutaneous QHS  . insulin aspart  0-9 Units Subcutaneous TID WC  . methylPREDNISolone (SOLU-MEDROL) injection  60 mg Intravenous Q24H  . pantoprazole (PROTONIX) IV  40 mg Intravenous Q12H  . ticagrelor  90 mg Oral BID   Continuous Infusions: . dextrose 5 % and 0.45 % NaCl with KCl 20 mEq/L 75 mL/hr at 04/23/15 1131  PRN Meds:.HYDROmorphone (DILAUDID) injection, LORazepam, menthol-cetylpyridinium, ondansetron **OR** ondansetron (ZOFRAN) IV, prochlorperazine   Antibiotics   Anti-infectives    Start     Dose/Rate Route Frequency Ordered Stop   04/20/15 0230  ampicillin-sulbactam (UNASYN) 1.5 g in sodium chloride 0.9 % 50 mL IVPB     1.5 g 100 mL/hr over 30 Minutes Intravenous Every 6 hours 04/20/15 0226          Subjective:   Patient reports improving abdominal pain. He has had 2 BM today. He is passing flatus. Has some nausea but no vomiting.  Objective:   Filed Vitals:   04/22/15 0431 04/22/15 1636 04/22/15 2150 04/23/15 0450  BP: 137/82 114/80 133/82 130/88  Pulse: 78 86 85 84  Temp: 98.2 F (36.8 C) 98.3 F (36.8 C) 97.7 F (36.5 C) 98.7 F (37.1 C)  TempSrc: Oral Oral Oral Oral  Resp: 18 18 20 20   Height:      Weight:      SpO2: 97% 94% 97% 95%    Intake/Output Summary (Last 24 hours) at 04/23/15 1255 Last data filed at 04/23/15 [id number]  Gross per 24 hour    Intake    125 ml  Output    850 ml  Net   -725 ml     Wt Readings from Last 3 Encounters:  04/20/15 106.9 kg (235 lb 10.8 oz)  04/19/15 108.41 kg (239 lb)  11/17/14 107.82 kg (237 lb 11.2 oz)     Exam  General: Alert and oriented x 3, uncomfortable  HEENT:  PERRLA, EOMI  Neck: Supple, no JVD,  CVS: S1 S2 RRR  Respiratory: clear bilaterally    Abdomen: Soft,  diffuse tenderness , distended, + bowel sounds  Ext: no cyanosis clubbing or edema  Neuro: no new deficits  Skin: No rashes  Psych: Normal affect and demeanor, alert and oriented x3    Data Review   Micro Results Recent Results (from the past 240 hour(s))  Gastrointestinal Panel by PCR , Stool     Status: None   Collection Time: 04/20/15  4:40 PM  Result Value Ref Range Status   Campylobacter species NOT DETECTED NOT DETECTED Final   Plesimonas shigelloides NOT DETECTED NOT DETECTED Final   Salmonella species NOT DETECTED NOT DETECTED Final   Yersinia enterocolitica NOT DETECTED NOT DETECTED Final   Vibrio species NOT DETECTED NOT DETECTED Final   Vibrio cholerae NOT DETECTED NOT DETECTED Final   Enteroaggregative E coli (EAEC) NOT DETECTED NOT DETECTED Final   Enteropathogenic E coli (EPEC) NOT DETECTED NOT DETECTED Final   Enterotoxigenic E coli (ETEC) NOT DETECTED NOT DETECTED Final   Shiga like toxin producing E coli (STEC) NOT DETECTED NOT DETECTED Final   E. coli O157 NOT DETECTED NOT DETECTED Final   Shigella/Enteroinvasive E coli (EIEC) NOT DETECTED NOT DETECTED Final   Cryptosporidium NOT DETECTED NOT DETECTED Final   Cyclospora cayetanensis NOT DETECTED NOT DETECTED Final   Entamoeba histolytica NOT DETECTED NOT DETECTED Final   Giardia lamblia NOT DETECTED NOT DETECTED Final   Adenovirus F40/41 NOT DETECTED NOT DETECTED Final   Astrovirus NOT DETECTED NOT DETECTED Final   Norovirus GI/GII NOT DETECTED NOT DETECTED Final   Rotavirus A NOT DETECTED NOT DETECTED Final   Sapovirus (I, II,  IV, and V) NOT DETECTED NOT DETECTED Final  C difficile quick scan w PCR reflex     Status: None   Collection Time: 04/20/15  4:40 PM  Result Value Ref Range Status  C Diff antigen NEGATIVE NEGATIVE Final   C Diff toxin NEGATIVE NEGATIVE Final   C Diff interpretation Negative for toxigenic C. difficile  Final    Radiology Reports Dg Chest 2 View  04/19/2015  CLINICAL DATA:  Chest pain and cough beginning yesterday. Chest pressure when coughing. EXAM: CHEST  2 VIEW COMPARISON:  Single view of the chest 07/22/2014. FINDINGS: The lungs are clear. Heart size is normal. No pneumothorax or pleural effusion. Azygos fissure is noted. Thoracic spondylosis also seen. IMPRESSION: No acute disease. Electronically Signed   By: Inge Rise M.D.   On: 04/19/2015 11:00   Ct Abdomen Pelvis W Contrast  04/19/2015  CLINICAL DATA:  Low pelvic pain for 1.5 days. EXAM: CT ABDOMEN AND PELVIS WITH CONTRAST TECHNIQUE: Multidetector CT imaging of the abdomen and pelvis was performed using the standard protocol following bolus administration of intravenous contrast. CONTRAST:  164mL OMNIPAQUE IOHEXOL 300 MG/ML  SOLN COMPARISON:  03/21/07 FINDINGS: Lower chest: There is no pleural effusion identified the lung bases are clear. Hepatobiliary: There is no focal liver abnormality. Previous cholecystectomy. Pancreas: Normal appearance of the pancreas. Spleen: Negative Adrenals/Urinary Tract: The adrenal glands are normal. Unremarkable appearance of the kidneys. The urinary bladder appears within normal limits. Stomach/Bowel: The stomach appears normal the patient is status post total collect. A Hartman's pouch is identified within the pelvis. The small bowel loops are mildly increased in caliber measuring up to 3.5 cm, image number 49 of series 4. Mild small bowel wall enhancement with surrounding inflammation. There is a small amount of free fluid within the white lower quadrant of the abdomen surrounding the small bowel loops,  for example image 34 series 4. No abscess identified. Vascular/Lymphatic: Calcified atherosclerotic disease involves the abdominal aorta. No aneurysm. No enlarged retroperitoneal or mesenteric adenopathy. No enlarged pelvic or inguinal lymph nodes. Reproductive: Prostate gland and seminal vesicles are unremarkable. Other: There is no ascites or focal fluid collections within the abdomen or pelvis. Musculoskeletal: The visualized bony structures are remarkable for degenerative disc disease within the lower thoracic spine. IMPRESSION: 1. Postoperative changes of total colectomy and Hartman's pouch formation. 2. There are mild inflammatory changes involving the mildly dilated right lower quadrant small bowel loops with surrounding fat stranding and free fluid. Findings compatible with acute enteritis. No evidence for perforation or abscess formation. No high-grade obstruction 3. Aortic atherosclerosis Electronically Signed   By: Kerby Moors M.D.   On: 04/19/2015 14:32   Dg Abd Acute W/chest  04/22/2015  CLINICAL DATA:  Acute abdominal pain, nausea and vomiting. EXAM: DG ABDOMEN ACUTE W/ 1V CHEST COMPARISON:  None. FINDINGS: Dilated small bowel loops with air-fluid levels are noted concerning for distal small bowel obstruction. Surgical sutures are noted in the pelvis. Status post cholecystectomy. No acute cardiopulmonary abnormality is seen. IMPRESSION: Dilated small bowel loops with air-fluid levels are noted concerning for distal small bowel obstruction. Continued radiographic follow-up is recommended. Electronically Signed   By: Marijo Conception, M.D.   On: 04/22/2015 10:34    CBC  Recent Labs Lab 04/19/15 1030 04/19/15 2250 04/21/15 0610 04/22/15 0653  WBC 14.1* 5.9 4.4 5.8  HGB 17.1* 16.0 13.9 13.6  HCT 47.6 45.1 39.7 38.9*  PLT 253 205 208 223  MCV 89.0 88.8 88.8 88.0  MCH 32.0 31.5 31.1 30.8  MCHC 35.9 35.5 35.0 35.0  RDW 12.5 12.4 12.2 12.0  LYMPHSABS 3.4 1.0  --  1.6  MONOABS 1.0 0.8   --  1.2*  EOSABS 0.1 0.0  --  0.0  BASOSABS 0.1 0.0  --  0.0    Chemistries   Recent Labs Lab 04/19/15 1030 04/19/15 2250 04/21/15 0610 04/22/15 0653  NA 138 138 136 138  K 4.0 4.0 3.9 3.9  CL 103 104 103 102  CO2 23 21* 25 28  GLUCOSE 195* 216* 225* 156*  BUN 15 15 11 10   CREATININE 0.64 0.79 0.61 0.58*  CALCIUM 9.7 9.3 8.4* 8.8*  MG  --   --   --  1.8  AST 19 19  --  11*  ALT 20 17  --  12*  ALKPHOS 69 57  --  44  BILITOT 0.9 1.7*  --  0.7   ------------------------------------------------------------------------------------------------------------------ estimated creatinine clearance is 133.3 mL/min (by C-G formula based on Cr of 0.58). ------------------------------------------------------------------------------------------------------------------  Recent Labs  04/21/15 0607  HGBA1C 7.1*   ------------------------------------------------------------------------------------------------------------------ No results for input(s): CHOL, HDL, LDLCALC, TRIG, CHOLHDL, LDLDIRECT in the last 72 hours. ------------------------------------------------------------------------------------------------------------------ No results for input(s): TSH, T4TOTAL, T3FREE, THYROIDAB in the last 72 hours.  Invalid input(s): FREET3 ------------------------------------------------------------------------------------------------------------------ No results for input(s): VITAMINB12, FOLATE, FERRITIN, TIBC, IRON, RETICCTPCT in the last 72 hours.  Coagulation profile No results for input(s): INR, PROTIME in the last 168 hours.  No results for input(s): DDIMER in the last 72 hours.  Cardiac Enzymes  Recent Labs Lab 04/19/15 1030  TROPONINI <0.03   ------------------------------------------------------------------------------------------------------------------ Invalid input(s): POCBNP   Recent Labs  04/22/15 0716 04/22/15 1124 04/22/15 1634 04/22/15 2039 04/23/15 0727  04/23/15 1207  GLUCAP 149* 166* 185* 193* 152* 175*     Brigitta Pricer M.D. [facility] 04/23/2015, 12:55 PM  Pager: [phone] Between 7am to 7pm - call Pager - [phone]  After 7pm go to [email or web address] - password [email or web address]  Call night coverage person covering after 7pm

## 2015-04-23 NOTE — Progress Notes (Signed)
Pt removed NG tube.  Physicians notified.

## 2015-04-23 NOTE — Progress Notes (Signed)
Diet changed to clear liquids by Dr. Arnoldo Morale.

## 2015-04-23 NOTE — Consult Note (Signed)
Reason for Consult: Small bowel obstruction Referring Physician: Hospitalist  Calvin Cruz is an 55 y.o. male.  HPI: Patient is a 55 year old white male with a history of ulcerative colitis and coronary artery disease who presents with worsening abdominal distention. An abdominal film done in the emergency room revealed a possible bowel obstruction. Patient states he has had a bowel movement and has passed gas since admission. He does have an NG tube in place. His abdominal pain has eased.  Past Medical History  Diagnosis Date  . Ulcerative colitis   . Gout 2014  . Fatigue 11/28/2012  . Erythrocytosis 11/28/2012  . Non-STEMI (non-ST elevated myocardial infarction) ([place]) 07/22/2014    a) RCA: OK, LM: Okay ; LAD: Proximal to mid 40%; RI: 30%;LCx: Mid to distal 90%, distal 20%, OM1 50% --> DES PCI  . CAD S/P DES PCI-circumflex 07/22/2014    PCI mCx: PCI: 2.75 x 18 mm Xience Alpine DES to mid LCx at takeoff of OM1 (OM1 jailed 50% stenosis)  . Essential hypertension   . Hyperlipidemia with target LDL less than 70   . Hypertriglyceridemia 11/16/2014    Past Surgical History  Procedure Laterality Date  . Colectomy    . Hernia repair    . Cardiac catheterization N/A 07/23/2014    Procedure: Left Heart Cath and Coronary Angiography;  Surgeon: Leonie Man, MD;  Location: [facility];  Service: Cardiovascular;  RCA: Mild disease; LM: Okay ; LAD: Proximal to mid 40%; RI: 30%;LCx: Mid to distal 90%, distal 20%, OM1 50%; Mid inferolateral HK, EF 55-65%   . Cardiac catheterization N/A 07/23/2014    Procedure: Coronary Stent Intervention;  Surgeon: Leonie Man, MD;  Location: [facility] [facility];  Service: Cardiovascular; PCI: 2.75 x 18 mm Xience Alpine DES to mid LCx at takeoff of OM1 (OM1 jailed 50% stenosis)  . Transthoracic echocardiogram  07/22/2014    EF 55-60%, normal wall motion, normal diastolic function,  no valve lesions     Family History  Problem Relation Age of Onset  .  Diabetes Mother   . Coronary artery disease Mother   . Cancer Father 13    lung  . Diabetes Brother     Social History:  reports that he quit smoking about 13 years ago. His smoking use included Cigarettes. He has never used smokeless tobacco. He reports that he does not drink alcohol or use illicit drugs.  Allergies:  Allergies  Allergen Reactions  . Flagyl [Metronidazole] Itching  . Ciprofloxacin Itching and Nausea And Vomiting  . Doxycycline Itching and Other (See Comments)    Redness of skin, burning sensation  . Morphine And Related Itching    Medications: I have reviewed the patient's current medications.  Results for orders placed or performed during the hospital encounter of 04/19/15 (from the past 48 hour(s))  Glucose, capillary     Status: Abnormal   Collection Time: 04/21/15 11:34 AM  Result Value Ref Range   Glucose-Capillary 200 (H) 65 - 99 mg/dL   Comment 1 Notify RN   Glucose, capillary     Status: Abnormal   Collection Time: 04/21/15  4:47 PM  Result Value Ref Range   Glucose-Capillary 227 (H) 65 - 99 mg/dL   Comment 1 Notify RN   Glucose, capillary     Status: Abnormal   Collection Time: 04/21/15  8:21 PM  Result Value Ref Range   Glucose-Capillary 216 (H) 65 - 99 mg/dL  Basic metabolic panel  Status: Abnormal   Collection Time: 04/22/15  6:53 AM  Result Value Ref Range   Sodium 138 135 - 145 mmol/L   Potassium 3.9 3.5 - 5.1 mmol/L   Chloride 102 101 - 111 mmol/L   CO2 28 22 - 32 mmol/L   Glucose, Bld 156 (H) 65 - 99 mg/dL   BUN 10 6 - 20 mg/dL   Creatinine, Ser 0.58 (L) 0.61 - 1.24 mg/dL   Calcium 8.8 (L) 8.9 - 10.3 mg/dL   GFR calc non Af Amer >60 >60 mL/min   GFR calc Af Amer >60 >60 mL/min    Comment: (NOTE) The eGFR has been calculated using the CKD EPI equation. This calculation has not been validated in all clinical situations. eGFR's persistently <60 mL/min signify possible Chronic Kidney Disease.    Anion gap 8 5 - 15  Magnesium      Status: None   Collection Time: 04/22/15  6:53 AM  Result Value Ref Range   Magnesium 1.8 1.7 - 2.4 mg/dL  Hepatic function panel     Status: Abnormal   Collection Time: 04/22/15  6:53 AM  Result Value Ref Range   Total Protein 6.5 6.5 - 8.1 g/dL   Albumin 3.2 (L) 3.5 - 5.0 g/dL   AST 11 (L) 15 - 41 U/L   ALT 12 (L) 17 - 63 U/L   Alkaline Phosphatase 44 38 - 126 U/L   Total Bilirubin 0.7 0.3 - 1.2 mg/dL   Bilirubin, Direct 0.1 0.1 - 0.5 mg/dL   Indirect Bilirubin 0.6 0.3 - 0.9 mg/dL  Sedimentation rate     Status: Abnormal   Collection Time: 04/22/15  6:53 AM  Result Value Ref Range   Sed Rate 60 (H) 0 - 16 mm/hr  C-reactive protein     Status: Abnormal   Collection Time: 04/22/15  6:53 AM  Result Value Ref Range   CRP 5.5 (H) <1.0 mg/dL    Comment: Performed at [facility]  CBC with Differential/Platelet     Status: Abnormal   Collection Time: 04/22/15  6:53 AM  Result Value Ref Range   WBC 5.8 4.0 - 10.5 K/uL   RBC 4.42 4.22 - 5.81 MIL/uL   Hemoglobin 13.6 13.0 - 17.0 g/dL   HCT 38.9 (L) 39.0 - 52.0 %   MCV 88.0 78.0 - 100.0 fL   MCH 30.8 26.0 - 34.0 pg   MCHC 35.0 30.0 - 36.0 g/dL   RDW 12.0 11.5 - 15.5 %   Platelets 223 150 - 400 K/uL   Neutrophils Relative % 52 %   Neutro Abs 3.1 1.7 - 7.7 K/uL   Lymphocytes Relative 27 %   Lymphs Abs 1.6 0.7 - 4.0 K/uL   Monocytes Relative 21 %   Monocytes Absolute 1.2 (H) 0.1 - 1.0 K/uL   Eosinophils Relative 0 %   Eosinophils Absolute 0.0 0.0 - 0.7 K/uL   Basophils Relative 0 %   Basophils Absolute 0.0 0.0 - 0.1 K/uL  Glucose, capillary     Status: Abnormal   Collection Time: 04/22/15  7:16 AM  Result Value Ref Range   Glucose-Capillary 149 (H) 65 - 99 mg/dL   Comment 1 Notify RN   Glucose, capillary     Status: Abnormal   Collection Time: 04/22/15 11:24 AM  Result Value Ref Range   Glucose-Capillary 166 (H) 65 - 99 mg/dL  Glucose, capillary     Status: Abnormal   Collection Time: 04/22/15  4:34 PM  Result  Value Ref Range   Glucose-Capillary 185 (H) 65 - 99 mg/dL   Comment 1 Notify RN   Glucose, capillary     Status: Abnormal   Collection Time: 04/22/15  8:39 PM  Result Value Ref Range   Glucose-Capillary 193 (H) 65 - 99 mg/dL  Glucose, capillary     Status: Abnormal   Collection Time: 04/23/15  7:27 AM  Result Value Ref Range   Glucose-Capillary 152 (H) 65 - 99 mg/dL   Comment 1 Notify RN     Dg Abd Acute W/chest  04/22/2015  CLINICAL DATA:  Acute abdominal pain, nausea and vomiting. EXAM: DG ABDOMEN ACUTE W/ 1V CHEST COMPARISON:  None. FINDINGS: Dilated small bowel loops with air-fluid levels are noted concerning for distal small bowel obstruction. Surgical sutures are noted in the pelvis. Status post cholecystectomy. No acute cardiopulmonary abnormality is seen. IMPRESSION: Dilated small bowel loops with air-fluid levels are noted concerning for distal small bowel obstruction. Continued radiographic follow-up is recommended. Electronically Signed   By: Marijo Conception, M.D.   On: 04/22/2015 10:34    ROS: See chart Blood pressure 130/88, pulse 84, temperature 98.7 F (37.1 C), temperature source Oral, resp. rate 20, height 6' 6" (1.981 m), weight 106.9 kg (235 lb 10.8 oz), SpO2 95 %. Physical Exam: Pleasant white male in no acute distress. Abdomen is soft with no significant tenderness noted. No distention noted. No obvious hernias noted. Rectal examination was deferred at this time. Bowel sounds occasionally heard.  Assessment/Plan: Impression: Partial small bowel obstruction, clinically seems to be improving. No need for acute surgical intervention at this time. Patient has been on Brilinta which would preclude any surgical intervention unless an emergency arises. Would hold this until we are sure that he does not need surgery. May remove NG tube in a.m.  , A 04/23/2015, 9:53 AM

## 2015-04-24 LAB — BASIC METABOLIC PANEL
ANION GAP: 8 (ref 5–15)
BUN: 13 mg/dL (ref 6–20)
CHLORIDE: 100 mmol/L — AB (ref 101–111)
CO2: 27 mmol/L (ref 22–32)
Calcium: 8.6 mg/dL — ABNORMAL LOW (ref 8.9–10.3)
Creatinine, Ser: 0.78 mg/dL (ref 0.61–1.24)
GFR calc Af Amer: >60 mL/min (ref >60)
GFR calc non Af Amer: >60 mL/min (ref >60)
Glucose, Bld: 169 mg/dL — ABNORMAL HIGH (ref 65–99)
POTASSIUM: 3.4 mmol/L — AB (ref 3.5–5.1)
SODIUM: 135 mmol/L (ref 135–145)

## 2015-04-24 LAB — GLUCOSE, CAPILLARY
GLUCOSE-CAPILLARY: 144 mg/dL — AB (ref 65–99)
GLUCOSE-CAPILLARY: 184 mg/dL — AB (ref 65–99)
GLUCOSE-CAPILLARY: 227 mg/dL — AB (ref 65–99)
Glucose-Capillary: 174 mg/dL — ABNORMAL HIGH (ref 65–99)

## 2015-04-24 LAB — MAGNESIUM: MAGNESIUM: 1.9 mg/dL (ref 1.7–2.4)

## 2015-04-24 MED ORDER — MAGNESIUM SULFATE 2 GM/50ML IV SOLN
2.0000 g | Freq: Once | INTRAVENOUS | Status: AC
  Administered 2015-04-24: 2 g via INTRAVENOUS
  Filled 2015-04-24: qty 50

## 2015-04-24 MED ORDER — POTASSIUM CHLORIDE 10 MEQ/100ML IV SOLN
10.0000 meq | INTRAVENOUS | Status: AC
  Administered 2015-04-24 (×3): 10 meq via INTRAVENOUS
  Filled 2015-04-24: qty 100

## 2015-04-24 MED ORDER — MAGNESIUM SULFATE 50 % IJ SOLN
2.0000 g | Freq: Once | INTRAVENOUS | Status: DC
  Filled 2015-04-24: qty 4

## 2015-04-24 NOTE — Progress Notes (Signed)
Patient ID: Calvin Cruz, male   DOB: 08/27/1960, 55 y.o.   MRN: [id number]  Assessment/Plan: ADMITTED WITH SBO. CLINICALLY IMPROVED. AAS REMAINS UNCHANGED.  PLAN: 1. SUPPORTIVE CARE 2. ADVANCE DIET AS TOLERATED   Subjective: Since I last evaluated the patient HIS NAUSEA/VOMITING AND ABDOMINAL PAIN HAS RESOLVED. GOING TO GO FOR A WALK. TOLERATING FULL LIQUID DIET.  Objective: Vital signs in last 24 hours: Filed Vitals:   04/23/15 2136 04/24/15 0435  BP: 142/84 141/85  Pulse: 77 85  Temp: 98.6 F (37 C) 98.1 F (36.7 C)  Resp: 20 20   General appearance: alert, cooperative and no distress Resp: clear to auscultation bilaterally Cardio: regular rate and rhythm GI: soft, non-tender; bowel sounds normal  Lab Results:  K 3.4 Cr 0.78   Studies/Results: Dg Abd Acute W/chest  04/23/2015  CLINICAL DATA:  Small bowel obstruction. Hypertension and ulcerative colitis EXAM: DG ABDOMEN ACUTE W/ 1V CHEST COMPARISON:  04/22/2015 FINDINGS: Multiple dilated loops of small bowel are identified throughout the abdomen. Compared with the previous exam the degree of bowel obstruction is not significantly improved. No radiopaque calculi or other significant radiographic abnormality is seen. Heart size and mediastinal contours are within normal limits. Both lungs are clear. IMPRESSION: 1. No change in small bowel obstruction pattern. 2. No active cardiopulmonary abnormalities. Electronically Signed   By: Kerby Moors M.D.   On: 04/23/2015 18:14    Medications: I have reviewed the patient's current medications.   LOS: 5 days   Barney Drain 07/30/2013, 2:23 PM

## 2015-04-24 NOTE — Progress Notes (Signed)
[facility]                                                                              Patient Demographics  Calvin Cruz, is a 55 y.o. male, DOB - 07/25/1960, [id number]  Admit date - 04/19/2015   Admitting Physician Orvan Falconer, MD  Outpatient Primary MD for the patient is Sheela Stack, MD  LOS - 4   Chief Complaint  Patient presents with  . Emesis       Brief HPI   Calvin Cruz is an 55 y.o. male with hx of UC, s/p colectomy with Hartman's pouch formation, hx of gout, NSTEMI, CAD s/p DES June 2016, HTN, HLD, seen earlier today for abdominal pain, nausea and vomiting, returned to the ER as nausea and vomiting persisted. He has no fever or chills, but did have URI symptoms, and took one Augmentin yesterday. He has some ill contact, but not definite. His wife was not ill. In the ER his WBC was 5.9K, though was 14K before, and his abdominal CT showed focal inflammation with enteritis.    Assessment & Plan   Principal Problem: Enteritis with underlying history of ulcerative colitis, status post colectomy with Hartman's pouch - appears to be slowly improving. - Continue IV fluid hydration, IV unasyn - GI following, advance diet to full liquids today.  - C. Difficile negative, fecal lactoferrin, GI pathogen panel negative  - Continue IV pain control, antiemetics  Active Problems:  Partial small bowel obstruction: With clinical improvement, passing flatus - Gen surgery following - Had NG tube placed on 3/3 with some improvement in abdominal distention and pain however patient removed NG tube on 3/4 - Increase ambulation, advance diet to full liquids - Keep potassium~4, Mg>2, replaced   Hyperlipidemia with target LDL less than 70 - On statin    CAD S/P DES PCI-circumflex - Continue aspirin. Brilinta currently on hold since he may require surgical intervention. Would resume brilinta on discharge  Diabetes mellitus - Per patient's  wife, hemoglobin A1c was checked on 2/24 and was 7.2, hence will not order. His endocrinologist is Dr. Forde Dandy. - Sliding scale Insulin for now  Code Status: Full CODE STATUS  Family Communication: Discussed in detail with the patient, all imaging results, lab results explained to the patient and wife at the bedside   Disposition Plan: Hopefully tomorrow if tolerating full liquids diet  Time Spent in minutes 25 minutes  Procedures  CT abdomen and pelvis  Consults   GI Gen. surgery  DVT Prophylaxis   SCD's  Medications  Scheduled Meds: . ampicillin-sulbactam (UNASYN) IV  1.5 g Intravenous Q6H  . aspirin EC  81 mg Oral Daily  . ezetimibe  10 mg Oral Daily  . insulin aspart  0-5 Units Subcutaneous QHS  . insulin aspart  0-9 Units Subcutaneous TID WC  . methylPREDNISolone (SOLU-MEDROL) injection  60 mg Intravenous Q24H  . pantoprazole (PROTONIX) IV  40 mg Intravenous Q12H   Continuous Infusions: . dextrose 5 % and 0.45 % NaCl with KCl 20 mEq/L 75 mL/hr at 04/24/15 0032   PRN Meds:.HYDROmorphone (DILAUDID) injection, LORazepam, menthol-cetylpyridinium, ondansetron **OR** ondansetron (ZOFRAN)  IV, prochlorperazine   Antibiotics   Anti-infectives    Start     Dose/Rate Route Frequency Ordered Stop   04/20/15 0230  ampicillin-sulbactam (UNASYN) 1.5 g in sodium chloride 0.9 % 50 mL IVPB     1.5 g 100 mL/hr over 30 Minutes Intravenous Every 6 hours 04/20/15 0226          Subjective:   Calvin Cruz was seen and examined today.  States passing flatus, small BM. Patient pulled the NG tube yesterday morning. Abdominal pain improving. Will like to try full liquids today. Still having nausea. Patient denies dizziness, chest pain, shortness of breath, new weakness, numbess, tingling. No acute events overnight.    Objective:   Filed Vitals:   04/23/15 1446 04/23/15 2002 04/23/15 2136 04/24/15 0435  BP: 131/80  142/84 141/85  Pulse: 73  77 85  Temp: 98.2 F (36.8 C)  98.6 F  (37 C) 98.1 F (36.7 C)  TempSrc: Oral  Oral Oral  Resp: 20  20 20   Height:      Weight:      SpO2: 97% 95% 96% 96%    Intake/Output Summary (Last 24 hours) at 04/24/15 1012 Last data filed at 04/24/15 0909  Gross per 24 hour  Intake      0 ml  Output      0 ml  Net      0 ml     Wt Readings from Last 3 Encounters:  04/20/15 106.9 kg (235 lb 10.8 oz)  04/19/15 108.41 kg (239 lb)  11/17/14 107.82 kg (237 lb 11.2 oz)     Exam  General: Alert and oriented x 3, NAD  HEENT:  PERRLA, EOMI,  Neck: Supple, no JVD  CVS: S1 S2 auscultated, no rubs, murmurs or gallops. Regular rate and rhythm.  Respiratory: Clear to auscultation bilaterally, no wheezing, rales or rhonchi  Abdomen: Soft, mild diffuse tenderness , nondistended, + bowel sounds  Ext: no cyanosis clubbing or edema  Neuro: no new deficits  Skin: No rashes  Psych: Normal affect and demeanor, alert and oriented x3    Data Review   Micro Results Recent Results (from the past 240 hour(s))  Gastrointestinal Panel by PCR , Stool     Status: None   Collection Time: 04/20/15  4:40 PM  Result Value Ref Range Status   Campylobacter species NOT DETECTED NOT DETECTED Final   Plesimonas shigelloides NOT DETECTED NOT DETECTED Final   Salmonella species NOT DETECTED NOT DETECTED Final   Yersinia enterocolitica NOT DETECTED NOT DETECTED Final   Vibrio species NOT DETECTED NOT DETECTED Final   Vibrio cholerae NOT DETECTED NOT DETECTED Final   Enteroaggregative E coli (EAEC) NOT DETECTED NOT DETECTED Final   Enteropathogenic E coli (EPEC) NOT DETECTED NOT DETECTED Final   Enterotoxigenic E coli (ETEC) NOT DETECTED NOT DETECTED Final   Shiga like toxin producing E coli (STEC) NOT DETECTED NOT DETECTED Final   E. coli O157 NOT DETECTED NOT DETECTED Final   Shigella/Enteroinvasive E coli (EIEC) NOT DETECTED NOT DETECTED Final   Cryptosporidium NOT DETECTED NOT DETECTED Final   Cyclospora cayetanensis NOT DETECTED NOT  DETECTED Final   Entamoeba histolytica NOT DETECTED NOT DETECTED Final   Giardia lamblia NOT DETECTED NOT DETECTED Final   Adenovirus F40/41 NOT DETECTED NOT DETECTED Final   Astrovirus NOT DETECTED NOT DETECTED Final   Norovirus GI/GII NOT DETECTED NOT DETECTED Final   Rotavirus A NOT DETECTED NOT DETECTED Final   Sapovirus (I, II, IV,  and V) NOT DETECTED NOT DETECTED Final  C difficile quick scan w PCR reflex     Status: None   Collection Time: 04/20/15  4:40 PM  Result Value Ref Range Status   C Diff antigen NEGATIVE NEGATIVE Final   C Diff toxin NEGATIVE NEGATIVE Final   C Diff interpretation Negative for toxigenic C. difficile  Final    Radiology Reports Dg Chest 2 View  04/19/2015  CLINICAL DATA:  Chest pain and cough beginning yesterday. Chest pressure when coughing. EXAM: CHEST  2 VIEW COMPARISON:  Single view of the chest 07/22/2014. FINDINGS: The lungs are clear. Heart size is normal. No pneumothorax or pleural effusion. Azygos fissure is noted. Thoracic spondylosis also seen. IMPRESSION: No acute disease. Electronically Signed   By: Inge Rise M.D.   On: 04/19/2015 11:00   Ct Abdomen Pelvis W Contrast  04/19/2015  CLINICAL DATA:  Low pelvic pain for 1.5 days. EXAM: CT ABDOMEN AND PELVIS WITH CONTRAST TECHNIQUE: Multidetector CT imaging of the abdomen and pelvis was performed using the standard protocol following bolus administration of intravenous contrast. CONTRAST:  189mL OMNIPAQUE IOHEXOL 300 MG/ML  SOLN COMPARISON:  03/21/07 FINDINGS: Lower chest: There is no pleural effusion identified the lung bases are clear. Hepatobiliary: There is no focal liver abnormality. Previous cholecystectomy. Pancreas: Normal appearance of the pancreas. Spleen: Negative Adrenals/Urinary Tract: The adrenal glands are normal. Unremarkable appearance of the kidneys. The urinary bladder appears within normal limits. Stomach/Bowel: The stomach appears normal the patient is status post total collect. A  Hartman's pouch is identified within the pelvis. The small bowel loops are mildly increased in caliber measuring up to 3.5 cm, image number 49 of series 4. Mild small bowel wall enhancement with surrounding inflammation. There is a small amount of free fluid within the white lower quadrant of the abdomen surrounding the small bowel loops, for example image 34 series 4. No abscess identified. Vascular/Lymphatic: Calcified atherosclerotic disease involves the abdominal aorta. No aneurysm. No enlarged retroperitoneal or mesenteric adenopathy. No enlarged pelvic or inguinal lymph nodes. Reproductive: Prostate gland and seminal vesicles are unremarkable. Other: There is no ascites or focal fluid collections within the abdomen or pelvis. Musculoskeletal: The visualized bony structures are remarkable for degenerative disc disease within the lower thoracic spine. IMPRESSION: 1. Postoperative changes of total colectomy and Hartman's pouch formation. 2. There are mild inflammatory changes involving the mildly dilated right lower quadrant small bowel loops with surrounding fat stranding and free fluid. Findings compatible with acute enteritis. No evidence for perforation or abscess formation. No high-grade obstruction 3. Aortic atherosclerosis Electronically Signed   By: Kerby Moors M.D.   On: 04/19/2015 14:32   Dg Abd Acute W/chest  04/23/2015  CLINICAL DATA:  Small bowel obstruction. Hypertension and ulcerative colitis EXAM: DG ABDOMEN ACUTE W/ 1V CHEST COMPARISON:  04/22/2015 FINDINGS: Multiple dilated loops of small bowel are identified throughout the abdomen. Compared with the previous exam the degree of bowel obstruction is not significantly improved. No radiopaque calculi or other significant radiographic abnormality is seen. Heart size and mediastinal contours are within normal limits. Both lungs are clear. IMPRESSION: 1. No change in small bowel obstruction pattern. 2. No active cardiopulmonary abnormalities.  Electronically Signed   By: Kerby Moors M.D.   On: 04/23/2015 18:14   Dg Abd Acute W/chest  04/22/2015  CLINICAL DATA:  Acute abdominal pain, nausea and vomiting. EXAM: DG ABDOMEN ACUTE W/ 1V CHEST COMPARISON:  None. FINDINGS: Dilated small bowel loops with air-fluid levels are  noted concerning for distal small bowel obstruction. Surgical sutures are noted in the pelvis. Status post cholecystectomy. No acute cardiopulmonary abnormality is seen. IMPRESSION: Dilated small bowel loops with air-fluid levels are noted concerning for distal small bowel obstruction. Continued radiographic follow-up is recommended. Electronically Signed   By: Marijo Conception, M.D.   On: 04/22/2015 10:34    CBC  Recent Labs Lab 04/19/15 1030 04/19/15 2250 04/21/15 0610 04/22/15 0653  WBC 14.1* 5.9 4.4 5.8  HGB 17.1* 16.0 13.9 13.6  HCT 47.6 45.1 39.7 38.9*  PLT 253 205 208 223  MCV 89.0 88.8 88.8 88.0  MCH 32.0 31.5 31.1 30.8  MCHC 35.9 35.5 35.0 35.0  RDW 12.5 12.4 12.2 12.0  LYMPHSABS 3.4 1.0  --  1.6  MONOABS 1.0 0.8  --  1.2*  EOSABS 0.1 0.0  --  0.0  BASOSABS 0.1 0.0  --  0.0    Chemistries   Recent Labs Lab 04/19/15 1030 04/19/15 2250 04/21/15 0610 04/22/15 0653 04/24/15 0807  NA 138 138 136 138 135  K 4.0 4.0 3.9 3.9 3.4*  CL 103 104 103 102 100*  CO2 23 21* 25 28 27   GLUCOSE 195* 216* 225* 156* 169*  BUN 15 15 11 10 13   CREATININE 0.64 0.79 0.61 0.58* 0.78  CALCIUM 9.7 9.3 8.4* 8.8* 8.6*  MG  --   --   --  1.8 1.9  AST 19 19  --  11*  --   ALT 20 17  --  12*  --   ALKPHOS 69 57  --  44  --   BILITOT 0.9 1.7*  --  0.7  --    ------------------------------------------------------------------------------------------------------------------ estimated creatinine clearance is 133.3 mL/min (by C-G formula based on Cr of 0.78). ------------------------------------------------------------------------------------------------------------------ No results for input(s): HGBA1C in the last 72  hours. ------------------------------------------------------------------------------------------------------------------ No results for input(s): CHOL, HDL, LDLCALC, TRIG, CHOLHDL, LDLDIRECT in the last 72 hours. ------------------------------------------------------------------------------------------------------------------ No results for input(s): TSH, T4TOTAL, T3FREE, THYROIDAB in the last 72 hours.  Invalid input(s): FREET3 ------------------------------------------------------------------------------------------------------------------ No results for input(s): VITAMINB12, FOLATE, FERRITIN, TIBC, IRON, RETICCTPCT in the last 72 hours.  Coagulation profile No results for input(s): INR, PROTIME in the last 168 hours.  No results for input(s): DDIMER in the last 72 hours.  Cardiac Enzymes  Recent Labs Lab 04/19/15 1030  TROPONINI <0.03   ------------------------------------------------------------------------------------------------------------------ Invalid input(s): POCBNP   Recent Labs  04/22/15 1634 04/22/15 2039 04/23/15 0727 04/23/15 1207 04/23/15 2005 04/24/15 0722  GLUCAP 185* 193* 152* 175* 166* 174*     RAI,RIPUDEEP M.D. [facility] 04/24/2015, 10:12 AM  Pager: [phone] Between 7am to 7pm - call Pager - [phone]  After 7pm go to [email or web address] - password [email or web address]  Call night coverage person covering after 7pm

## 2015-04-24 NOTE — Progress Notes (Signed)
  Subjective: Patient states his abdominal pain is better. Has not had a significant bowel movement yet. No nausea.  Objective: Vital signs in last 24 hours: Temp:  [98.1 F (36.7 C)-98.6 F (37 C)] 98.1 F (36.7 C) (03/05 0435) Pulse Rate:  [73-85] 85 (03/05 0435) Resp:  [20] 20 (03/05 0435) BP: (131-142)/(80-85) 141/85 mmHg (03/05 0435) SpO2:  [95 %-97 %] 96 % (03/05 0435) Last BM Date: 04/23/15  Intake/Output from previous day:   Intake/Output this shift:    General appearance: alert, cooperative and no distress GI: soft, non-tender; bowel sounds normal; no masses,  no organomegaly  Lab Results:   Recent Labs  04/22/15 0653  WBC 5.8  HGB 13.6  HCT 38.9*  PLT 223   BMET  Recent Labs  04/22/15 0653 04/24/15 0807  NA 138 135  K 3.9 3.4*  CL 102 100*  CO2 28 27  GLUCOSE 156* 169*  BUN 10 13  CREATININE 0.58* 0.78  CALCIUM 8.8* 8.6*   PT/INR No results for input(s): LABPROT, INR in the last 72 hours.  Studies/Results: Dg Abd Acute W/chest  04/23/2015  CLINICAL DATA:  Small bowel obstruction. Hypertension and ulcerative colitis EXAM: DG ABDOMEN ACUTE W/ 1V CHEST COMPARISON:  04/22/2015 FINDINGS: Multiple dilated loops of small bowel are identified throughout the abdomen. Compared with the previous exam the degree of bowel obstruction is not significantly improved. No radiopaque calculi or other significant radiographic abnormality is seen. Heart size and mediastinal contours are within normal limits. Both lungs are clear. IMPRESSION: 1. No change in small bowel obstruction pattern. 2. No active cardiopulmonary abnormalities. Electronically Signed   By: Kerby Moors M.D.   On: 04/23/2015 18:14    Anti-infectives: Anti-infectives    Start     Dose/Rate Route Frequency Ordered Stop   04/20/15 0230  ampicillin-sulbactam (UNASYN) 1.5 g in sodium chloride 0.9 % 50 mL IVPB     1.5 g 100 mL/hr over 30 Minutes Intravenous Every 6 hours 04/20/15 0226         Assessment/Plan: Impression: Bowel obstruction secondary to enteritis, slowly resolving. No need for acute surgical intervention. Patient pulled out NG tube. Agree with advancing diet as tolerated.  LOS: 4 days    Courteny Egler A 04/24/2015

## 2015-04-25 DIAGNOSIS — K529 Noninfective gastroenteritis and colitis, unspecified: Secondary | ICD-10-CM

## 2015-04-25 DIAGNOSIS — R109 Unspecified abdominal pain: Secondary | ICD-10-CM

## 2015-04-25 DIAGNOSIS — K5669 Other intestinal obstruction: Secondary | ICD-10-CM

## 2015-04-25 DIAGNOSIS — K5190 Ulcerative colitis, unspecified, without complications: Secondary | ICD-10-CM

## 2015-04-25 DIAGNOSIS — K91850 Pouchitis: Secondary | ICD-10-CM

## 2015-04-25 LAB — O&P RESULT

## 2015-04-25 LAB — BASIC METABOLIC PANEL
ANION GAP: 8 (ref 5–15)
BUN: 11 mg/dL (ref 6–20)
CO2: 29 mmol/L (ref 22–32)
Calcium: 8.8 mg/dL — ABNORMAL LOW (ref 8.9–10.3)
Chloride: 99 mmol/L — ABNORMAL LOW (ref 101–111)
Creatinine, Ser: 0.7 mg/dL (ref 0.61–1.24)
GFR calc Af Amer: >60 mL/min (ref >60)
Glucose, Bld: 155 mg/dL — ABNORMAL HIGH (ref 65–99)
POTASSIUM: 4 mmol/L (ref 3.5–5.1)
SODIUM: 136 mmol/L (ref 135–145)

## 2015-04-25 LAB — OVA + PARASITE EXAM

## 2015-04-25 LAB — GLUCOSE, CAPILLARY: GLUCOSE-CAPILLARY: 152 mg/dL — AB (ref 65–99)

## 2015-04-25 MED ORDER — TRAMADOL HCL 50 MG PO TABS: 50.0000 mg | ORAL_TABLET | Freq: Four times a day (QID) | ORAL | Status: DC | PRN

## 2015-04-25 MED ORDER — ONDANSETRON 4 MG PO TBDP: 4.0000 mg | ORAL_TABLET | ORAL | Status: DC | PRN

## 2015-04-25 MED ORDER — PANTOPRAZOLE SODIUM 40 MG PO TBEC: 40.0000 mg | DELAYED_RELEASE_TABLET | Freq: Every day | ORAL | Status: DC

## 2015-04-25 MED ORDER — AMOXICILLIN-POT CLAVULANATE 875-125 MG PO TABS: 1.0000 | ORAL_TABLET | Freq: Two times a day (BID) | ORAL | Status: DC

## 2015-04-25 NOTE — Discharge Summary (Signed)
Physician Discharge Summary   Patient ID: Calvin Cruz MRN: [id number] DOB/AGE: 1960/09/13 55 y.o.  Admit date: 04/19/2015 Discharge date: 04/25/2015  Primary Care Physician:  Sheela Stack, MD  Discharge Diagnoses:    . Enterocolitis . Hyperlipidemia with target LDL less than 70 . Pouchitis ([place]) . Ulcerative colitis ([place])  Consults: Gastroenterology  Recommendations for Outpatient Follow-up:  1. Please repeat CBC/BMET at next visit   DIET: Full liquid or low residue diet    Allergies:   Allergies  Allergen Reactions  . Flagyl [Metronidazole] Itching  . Ciprofloxacin Itching and Nausea And Vomiting  . Doxycycline Itching and Other (See Comments)    Redness of skin, burning sensation  . Morphine And Related Itching     DISCHARGE MEDICATIONS: Current Discharge Medication List    START taking these medications   Details  amoxicillin-clavulanate (AUGMENTIN) 875-125 MG tablet Take 1 tablet by mouth 2 (two) times daily. Qty: 14 tablet, Refills: 0    pantoprazole (PROTONIX) 40 MG tablet Take 1 tablet (40 mg total) by mouth daily. Qty: 30 tablet, Refills: 3    traMADol (ULTRAM) 50 MG tablet Take 1 tablet (50 mg total) by mouth every 6 (six) hours as needed for moderate pain. Qty: 30 tablet, Refills: 0      CONTINUE these medications which have CHANGED   Details  ondansetron (ZOFRAN ODT) 4 MG disintegrating tablet Take 1 tablet (4 mg total) by mouth every 4 (four) hours as needed for nausea or vomiting. Qty: 30 tablet, Refills: 0      CONTINUE these medications which have NOT CHANGED   Details  acetaminophen (TYLENOL) 325 MG tablet Take 650 mg by mouth every 6 (six) hours as needed for mild pain.    acidophilus (RISAQUAD) CAPS capsule Take 2 capsules by mouth daily.    aspirin EC 81 MG EC tablet Take 1 tablet (81 mg total) by mouth daily.    Coenzyme Q10 (COQ10) 100 MG CAPS Take 100 mg by mouth daily. GRADUALLY INCREASE TO 300 MG DAILY Qty: 90 each,  Refills: 11    diphenoxylate-atropine (LOMOTIL) 2.5-0.025 MG per tablet Take 2 tablets by mouth 2 (two) times daily. Twice daily and as needed for diarrhea    ezetimibe (ZETIA) 10 MG tablet Take 1 tablet (10 mg total) by mouth daily. Qty: 30 tablet, Refills: 11    HYDROmorphone (DILAUDID) 4 MG tablet Take 1 tablet (4 mg total) by mouth every 6 (six) hours as needed for severe pain. Qty: 20 tablet, Refills: 0    lisinopril (PRINIVIL,ZESTRIL) 2.5 MG tablet Take 1 tablet (2.5 mg total) by mouth daily. Qty: 30 tablet, Refills: 11   Associated Diagnoses: Essential hypertension    metoprolol tartrate (LOPRESSOR) 25 MG tablet Take 1 tablet (25 mg total) by mouth 2 (two) times daily. Qty: 60 tablet, Refills: 11    nitroGLYCERIN (NITROSTAT) 0.4 MG SL tablet Place 1 tablet (0.4 mg total) under the tongue every 5 (five) minutes x 3 doses as needed for chest pain. Qty: 25 tablet, Refills: 12    omega-3 acid ethyl esters (LOVAZA) 1 G capsule Take 2 capsules (2 g total) by mouth 2 (two) times daily. Qty: 120 capsule, Refills: 6    OVER THE COUNTER MEDICATION Patient takes 2 by mouth daily - Vita - Sprout Capsules - Multi Vitamin    rifaximin (XIFAXAN) 550 MG TABS tablet Take 550 mg by mouth 2 (two) times daily.    rosuvastatin (CRESTOR) 40 MG tablet Take 1/2 tablet by mouth  on Monday, Wednesday and Friday Qty: 30 tablet, Refills: 11   Associated Diagnoses: Hyperlipidemia    ticagrelor (BRILINTA) 90 MG TABS tablet Take 1 tablet (90 mg total) by mouth 2 (two) times daily. Qty: 60 tablet, Refills: 11         Brief H and P: For complete details please refer to admission H and P, but in brief [name] is an 55 y.o. male with hx of UC, s/p colectomy with Hartman's pouch formation, hx of gout, NSTEMI, CAD s/p DES June 2016, HTN, HLD, seen earlier today for abdominal pain, nausea and vomiting, returned to the ER as nausea and vomiting persisted. He has no fever or chills, but did have URI  symptoms, and took one Augmentin yesterday. He has some ill contact, but not definite. His wife was not ill. In the ER his WBC was 5.9K, though was 14K before, and his abdominal CT showed focal inflammation with enteritis.   Hospital Course:   Enteritis with underlying history of ulcerative colitis, status post colectomy with Hartman's pouch - Improving, patient is tolerating full liquid diet without any difficulty. -Patient was placed on IV fluid hydration, IVUnasyn.GI was consulted. Patient is now transitioned to oral Augmentin for 7 days. -  C. Difficile negative, fecal lactoferrin, GI pathogen panel negative  Patient was seen by GI prior to discharge and recommended to stay on a low residue diet for next 3-5 days, will check ESR and CRP in 4 weeks.   Partial small bowel obstruction: With clinical improvement - Gen. surgery was consulted. Had NG tube placed on 3/3 with some improvement in abdominal distention and pain however patient removed NG tube on 3/4 - Patient has clinical improvement, tolerating full liquids diet   Hyperlipidemia with target LDL less than 70 - On statin    CAD S/P DES PCI-circumflex - Continue aspirin. Brilinta was placed on hold if he needed surgical intervention, resumed brilinta on discharge  Diabetes mellitus - Per patient's wife, hemoglobin A1c was checked on 2/24 and was 7.2. His endocrinologist is Dr. Forde Dandy.  Day of Discharge BP 126/75 mmHg  Pulse 70  Temp(Src) 98.1 F (36.7 C) (Oral)  Resp 20  Ht _0  (1.981 m)  Wt 106.9 kg (235 lb 10.8 oz)  BMI 27.24 kg/m2  SpO2 97%  Physical Exam: General: Alert and awake oriented x3 not in any acute distress. HEENT: anicteric sclera, pupils reactive to light and accommodation CVS: S1-S2 clear no murmur rubs or gallops Chest: clear to auscultation bilaterally, no wheezing rales or rhonchi Abdomen: soft nontender, nondistended, normal bowel sounds Extremities: no cyanosis, clubbing or edema noted  bilaterally Neuro: Cranial nerves II-XII intact, no focal neurological deficits   The results of significant diagnostics from this hospitalization (including imaging, microbiology, ancillary and laboratory) are listed below for reference.    LAB RESULTS: Basic Metabolic Panel:  Recent Labs Lab 04/24/15 0807 04/25/15 0646  NA 135 136  K 3.4* 4.0  CL 100* 99*  CO2 27 29  GLUCOSE 169* 155*  BUN 13 11  CREATININE 0.78 0.70  CALCIUM 8.6* 8.8*  MG 1.9  --    Liver Function Tests:  Recent Labs Lab 04/19/15 2250 04/22/15 0653  AST 19 11*  ALT 17 12*  ALKPHOS 57 44  BILITOT 1.7* 0.7  PROT 7.8 6.5  ALBUMIN 4.4 3.2*    Recent Labs Lab 04/19/15 1030  LIPASE 21   No results for input(s): AMMONIA in the last 168 hours. CBC:  Recent  Labs Lab 04/21/15 0610 04/22/15 0653  WBC 4.4 5.8  NEUTROABS  --  3.1  HGB 13.9 13.6  HCT 39.7 38.9*  MCV 88.8 88.0  PLT 208 223   Cardiac Enzymes:  Recent Labs Lab 04/19/15 1030  TROPONINI <0.03   BNP: Invalid input(s): POCBNP CBG:  Recent Labs Lab 04/24/15 2047 04/25/15 0806  GLUCAP 227* 152*    Significant Diagnostic Studies:  Dg Chest 2 View  04/19/2015  CLINICAL DATA:  Chest pain and cough beginning yesterday. Chest pressure when coughing. EXAM: CHEST  2 VIEW COMPARISON:  Single view of the chest 07/22/2014. FINDINGS: The lungs are clear. Heart size is normal. No pneumothorax or pleural effusion. Azygos fissure is noted. Thoracic spondylosis also seen. IMPRESSION: No acute disease. Electronically Signed   By: Inge Rise M.D.   On: 04/19/2015 11:00   Ct Abdomen Pelvis W Contrast  04/19/2015  CLINICAL DATA:  Low pelvic pain for 1.5 days. EXAM: CT ABDOMEN AND PELVIS WITH CONTRAST TECHNIQUE: Multidetector CT imaging of the abdomen and pelvis was performed using the standard protocol following bolus administration of intravenous contrast. CONTRAST:  121m OMNIPAQUE IOHEXOL 300 MG/ML  SOLN COMPARISON:  03/21/07 FINDINGS:  Lower chest: There is no pleural effusion identified the lung bases are clear. Hepatobiliary: There is no focal liver abnormality. Previous cholecystectomy. Pancreas: Normal appearance of the pancreas. Spleen: Negative Adrenals/Urinary Tract: The adrenal glands are normal. Unremarkable appearance of the kidneys. The urinary bladder appears within normal limits. Stomach/Bowel: The stomach appears normal the patient is status post total collect. A Hartman's pouch is identified within the pelvis. The small bowel loops are mildly increased in caliber measuring up to 3.5 cm, image number 49 of series 4. Mild small bowel wall enhancement with surrounding inflammation. There is a small amount of free fluid within the white lower quadrant of the abdomen surrounding the small bowel loops, for example image 34 series 4. No abscess identified. Vascular/Lymphatic: Calcified atherosclerotic disease involves the abdominal aorta. No aneurysm. No enlarged retroperitoneal or mesenteric adenopathy. No enlarged pelvic or inguinal lymph nodes. Reproductive: Prostate gland and seminal vesicles are unremarkable. Other: There is no ascites or focal fluid collections within the abdomen or pelvis. Musculoskeletal: The visualized bony structures are remarkable for degenerative disc disease within the lower thoracic spine. IMPRESSION: 1. Postoperative changes of total colectomy and Hartman's pouch formation. 2. There are mild inflammatory changes involving the mildly dilated right lower quadrant small bowel loops with surrounding fat stranding and free fluid. Findings compatible with acute enteritis. No evidence for perforation or abscess formation. No high-grade obstruction 3. Aortic atherosclerosis Electronically Signed   By: TKerby MoorsM.D.   On: 04/19/2015 14:32    2D ECHO:   Disposition and Follow-up: Discharge Instructions    Discharge instructions    Complete by:  As directed   Diet: Full liquid or soft solids as  tolerated.   Please use tramadol as needed for moderate abdominal pain. Narcotics/oral dilaudid slows down the gut and worsens the symptoms.     Increase activity slowly    Complete by:  As directed             DISPOSITION: Home   DISCHARGE FOLLOW-UP Follow-up Information    Follow up with RRogene Houston MD On 05/10/2015.   Specialty:  Gastroenterology   Why:  at 10:00 am   Contact information:   [address] [place] [phone]      Follow up with [facility]  Antony Haste, MD In 2 weeks.   Specialty:  Endocrinology   Why:  for hospital follow-up   Contact information:   [place] [address] [phone]        Time spent on Discharge: 37 minutes    Signed:   Petrona Wyeth M.D. [facility] 04/25/2015, 10:57 AM Pager: [phone]

## 2015-04-25 NOTE — Care Management Note (Signed)
Case Management Note  Patient Details  Name: BRAXTON COLANDREA MRN: [id number] Date of Birth: 1960-12-04  Subjective/Objective:                  Pt is from home. Ind with ADL's. No HH or DME prior to admission. Pt plans to return home with self care.   Action/Plan: Pt discharging home today. No CM needs.   Expected Discharge Date:     04/25/2015             Expected Discharge Plan:  Home/Self Care  In-House Referral:  NA  Discharge planning Services  CM Consult  Post Acute Care Choice:  NA Choice offered to:  NA  DME Arranged:    DME Agency:     HH Arranged:    HH Agency:     Status of Service:  Completed, signed off  Medicare Important Message Given:    Date Medicare IM Given:    Medicare IM give by:    Date Additional Medicare IM Given:    Additional Medicare Important Message give by:     If discussed at [place] of Stay Meetings, dates discussed:    Additional Comments:  Sherald Barge, RN 04/25/2015, 11:29 AM

## 2015-04-25 NOTE — Progress Notes (Signed)
   Subjective:  Patient has no complaints. He feels much better. He states he pulled NG tube Saturday morning because he had difficulty swallowing Brilinta. Got stuck in his throat.  He has been passing large amount of flatus per rectum. She had a bowel movement this morning. Stool consistency firming up. He feels his abdomen is less distended but not at baseline. He denies nausea.  Objective: Blood pressure 126/75, pulse 70, temperature 98.1 F (36.7 C), temperature source Oral, resp. rate 20, height 6\' 6"  (1.981 m), weight 235 lb 10.8 oz (106.9 kg), SpO2 97 %. Patient is alert and in no acute distress. Abdomen is full. Bowel sounds are normal. On palpation is soft and nontender. Percussion noticed tympanitic across upper abdomen. No LE edema or clubbing noted.  Labs/studies Results:    Recent Labs  19-May-2015 0807 04/25/15 0646  NA 135 136  K 3.4* 4.0  CL 100* 99*  CO2 27 29  GLUCOSE 169* 155*  BUN 13 11  CREATININE 0.78 0.70  CALCIUM 8.6* 8.8*     Assessment:   Acute enteritis most likely due to viral infection resulting in ileus and or small bowel obstruction which has resolved. He is tolerating full liquids. I do not believe he needs to go home on steroids.  Recommendations:  Patient advised to stay on a low-residue diet for the next 3-5 days. Patient can take Phazyme OTC up to 3 times a day as needed for flatulence. Agree with DC planning. Will check sedimentation rate and CRP in 4 weeks prior to office visit.

## 2015-04-25 NOTE — Progress Notes (Signed)
Patient states he is being discharged, which is fine by me. Will sign off.

## 2015-04-28 ENCOUNTER — Telehealth (INDEPENDENT_AMBULATORY_CARE_PROVIDER_SITE_OTHER): Payer: Self-pay | Admitting: *Deleted

## 2015-04-28 DIAGNOSIS — K51918 Ulcerative colitis, unspecified with other complication: Secondary | ICD-10-CM

## 2015-04-28 NOTE — Telephone Encounter (Signed)
Per Dr.Rehman the patient will need to have labs drawn in 4 weeks , this was said on 04/25/2015. Labs are noted for 05/23/2015. Dr.Rehman stated that he would need a OV after lab work. Patient has an appointment with Terri on 05/10/2015, Dr.Rehman states that the appointment needs to be with him.

## 2015-04-28 NOTE — Telephone Encounter (Signed)
Noted  

## 2015-04-28 NOTE — Telephone Encounter (Signed)
Mr. Stoudt is scheduled to see Dr. Laural Golden on April 18th, 2017 at 1:15pm. Thank you.

## 2015-05-10 ENCOUNTER — Telehealth (INDEPENDENT_AMBULATORY_CARE_PROVIDER_SITE_OTHER): Payer: Self-pay | Admitting: *Deleted

## 2015-05-10 ENCOUNTER — Ambulatory Visit (INDEPENDENT_AMBULATORY_CARE_PROVIDER_SITE_OTHER): Payer: Self-pay | Admitting: Internal Medicine

## 2015-05-10 NOTE — Telephone Encounter (Signed)
Patient stopped by here today and states that he didn't ask for Dr. Arnoldo Morale to see him and he is not wanting to pay this bill for him since he said no one asked for Dr. Arnoldo Morale to see him --he came on his own.  She was at front desk discussing this.  I told her that it would be unusual for a doctor to come unless someone had requested him to consult.  She said he did come in a few times but really did nothing.  She was demanding that you be made aware of this and ask if he truly was asked to consult or did he do it on his own.  I told her that we would certainly try to help but this was nothing we could really do anything about that I was aware of.

## 2015-05-10 NOTE — Telephone Encounter (Signed)
I recommended surgical consultation but I did not ask Dr. Arnoldo Morale to see him. Patient should have told him when Dr. Arnoldo Morale went to see him and should stop the consultation there and then. I don't know what else to do if obtained history and examined the patient.

## 2015-05-11 DIAGNOSIS — Z0289 Encounter for other administrative examinations: Secondary | ICD-10-CM

## 2015-05-17 ENCOUNTER — Ambulatory Visit (INDEPENDENT_AMBULATORY_CARE_PROVIDER_SITE_OTHER): Payer: 59 | Admitting: Internal Medicine

## 2015-05-17 ENCOUNTER — Encounter (INDEPENDENT_AMBULATORY_CARE_PROVIDER_SITE_OTHER): Payer: Self-pay | Admitting: Internal Medicine

## 2015-05-17 VITALS — BP 110/70 | HR 76 | Temp 97.7°F | Resp 18 | Ht 78.0 in | Wt 229.5 lb

## 2015-05-17 DIAGNOSIS — K529 Noninfective gastroenteritis and colitis, unspecified: Secondary | ICD-10-CM | POA: Diagnosis not present

## 2015-05-17 DIAGNOSIS — K91850 Pouchitis: Secondary | ICD-10-CM

## 2015-05-17 MED ORDER — RIFAXIMIN 550 MG PO TABS: 550.0000 mg | ORAL_TABLET | Freq: Two times a day (BID) | ORAL | Status: DC

## 2015-05-17 NOTE — Patient Instructions (Signed)
Can resume diphenoxylate and 1 tablet by mouth twice daily; can increase dose to 2 twice daily if needed. Xifaxan 550 mg twice daily for 5-7 days for pouchitis as needed

## 2015-05-17 NOTE — Progress Notes (Signed)
Presenting complaint;  Follow-up for recent hospitalization for enteritis.  Database:  Patient is 55 year old Caucasian male who has history of ulcerative colitis, status post IPAA back in March 2003 by Dr. Cheryll Cockayne of [facility] who has done well except intermittent spells of pouchitis. He was hospitalized at [facility] between 04/19/2015 and 04/25/2015 with acute illness with nausea vomiting and diarrhea leading to dehydration. He also had low-grade fever. He was felt to have an infection but stool studies were negative. He required NG decompression briefly in addition to empiric antibiotic therapy and finally improved. He now returns for follow-up visit.  Subjective:  Patient returns for scheduled visit accompanied by his wife came. He feels a lot better. He states all in all he lost 15 pounds during his illness and he has gained more than 5 pounds back. Stool frequency and consistency has improved. He is generally having 6 stools per day. He has had few nights. He had to have a bowel movement. His appetite is back to normal. He is wondering if he can go back to his usual diet. He was having intermittent upper abdominal cramps until last week but not anymore. He denies melena or rectal bleeding. He wonders if he can go back on Lomotil and some of his other medications.   Current Medications: Outpatient Encounter Prescriptions as of 05/17/2015  Medication Sig  . acetaminophen (TYLENOL) 325 MG tablet Take 650 mg by mouth every 6 (six) hours as needed for mild pain.  Marland Kitchen acidophilus (RISAQUAD) CAPS capsule Take 2 capsules by mouth daily.  Marland Kitchen aspirin EC 81 MG EC tablet Take 1 tablet (81 mg total) by mouth daily.  . Cholecalciferol (VITAMIN D3) 5000 units CAPS Take 5,000 Units by mouth daily.  . Coenzyme Q10 (COQ10) 100 MG CAPS Take 100 mg by mouth daily. GRADUALLY INCREASE TO 300 MG DAILY  . nitroGLYCERIN (NITROSTAT) 0.4 MG SL tablet Place 1 tablet (0.4 mg total) under the tongue every 5 (five) minutes  x 3 doses as needed for chest pain.  Marland Kitchen Omeprazole Magnesium (PRILOSEC OTC PO) Take 20 mg by mouth daily as needed.  Marland Kitchen OVER THE COUNTER MEDICATION Patient takes 2 by mouth daily - Vita - Sprout Capsules - Multi Vitamin  . rosuvastatin (CRESTOR) 40 MG tablet Take 1/2 tablet by mouth on Monday, Wednesday and Friday  . ticagrelor (BRILINTA) 90 MG TABS tablet Take 1 tablet (90 mg total) by mouth 2 (two) times daily.  . Turmeric Curcumin 500 MG CAPS Take 500 mg by mouth daily.  Marland Kitchen ULORIC 40 MG tablet Take 40 mg by mouth daily.  . diphenoxylate-atropine (LOMOTIL) 2.5-0.025 MG per tablet Take 2 tablets by mouth 2 (two) times daily. Reported on 05/17/2015  . ezetimibe (ZETIA) 10 MG tablet Take 1 tablet (10 mg total) by mouth daily. (Patient not taking: Reported on 05/17/2015)  . lisinopril (PRINIVIL,ZESTRIL) 2.5 MG tablet Take 1 tablet (2.5 mg total) by mouth daily. (Patient not taking: Reported on 05/17/2015)  . metoprolol tartrate (LOPRESSOR) 25 MG tablet Take 1 tablet (25 mg total) by mouth 2 (two) times daily. (Patient not taking: Reported on 05/17/2015)  . omega-3 acid ethyl esters (LOVAZA) 1 G capsule Take 2 capsules (2 g total) by mouth 2 (two) times daily. (Patient not taking: Reported on 05/17/2015)  . rifaximin (XIFAXAN) 550 MG TABS tablet Take 550 mg by mouth 2 (two) times daily. Reported on 05/17/2015  . [DISCONTINUED] amoxicillin-clavulanate (AUGMENTIN) 875-125 MG tablet Take 1 tablet by mouth 2 (two) times daily. (Patient not  taking: Reported on 05/17/2015)  . [DISCONTINUED] HYDROmorphone (DILAUDID) 4 MG tablet Take 1 tablet (4 mg total) by mouth every 6 (six) hours as needed for severe pain. (Patient not taking: Reported on 05/17/2015)  . [DISCONTINUED] ondansetron (ZOFRAN ODT) 4 MG disintegrating tablet Take 1 tablet (4 mg total) by mouth every 4 (four) hours as needed for nausea or vomiting. (Patient not taking: Reported on 05/17/2015)  . [DISCONTINUED] pantoprazole (PROTONIX) 40 MG tablet Take 1  tablet (40 mg total) by mouth daily. (Patient not taking: Reported on 05/17/2015)  . [DISCONTINUED] traMADol (ULTRAM) 50 MG tablet Take 1 tablet (50 mg total) by mouth every 6 (six) hours as needed for moderate pain. (Patient not taking: Reported on 05/17/2015)   No facility-administered encounter medications on file as of 05/17/2015.     Objective: Blood pressure 110/70, pulse 76, temperature 97.7 F (36.5 C), temperature source Oral, resp. rate 18, height 6\' 6"  (1.981 m), weight 229 lb 8 oz (104.101 kg). Patient is alert and in no acute distress. Conjunctiva is pink. Sclera is nonicteric Oropharyngeal mucosa is normal. No neck masses or thyromegaly noted. Cardiac exam with regular rhythm normal S1 and S2. No murmur or gallop noted. Lungs are clear to auscultation. Abdomen is symmetrical. Bowel sounds are normal. On palpation abdomen is soft and nontender without organomegaly or masses.  No LE edema or clubbing noted.  Lab data: Abdominopelvic CT films showed 04/19/2015 reviewed.  Assessment:  #1. Recent bout of acute enteritis with ileus most likely secondary to infectious or toxic etiology. He has fully recovered. No further workup needed at this time. #2. History of pouchitis.   Plan:  Patient can go back on his usual diet. Resume diphenoxylate 1 tablet by mouth twice a day. Patient also advised to go back on metoprolol and lisinopril at a later date. New prescription given for Xifaxan 550 mg tablets to be used for spells of pouchitis. Office visit in 6 months.

## 2015-05-18 ENCOUNTER — Other Ambulatory Visit (INDEPENDENT_AMBULATORY_CARE_PROVIDER_SITE_OTHER): Payer: Self-pay | Admitting: *Deleted

## 2015-05-18 ENCOUNTER — Encounter (INDEPENDENT_AMBULATORY_CARE_PROVIDER_SITE_OTHER): Payer: Self-pay | Admitting: *Deleted

## 2015-05-18 DIAGNOSIS — K51918 Ulcerative colitis, unspecified with other complication: Secondary | ICD-10-CM

## 2015-05-24 LAB — CBC
HCT: 45.3 % (ref 38.5–50.0)
Hemoglobin: 15.5 g/dL (ref 13.2–17.1)
MCH: 31 pg (ref 27.0–33.0)
MCHC: 34.2 g/dL (ref 32.0–36.0)
MCV: 90.6 fL (ref 80.0–100.0)
MPV: 9.5 fL (ref 7.5–12.5)
PLATELETS: 232 10*3/uL (ref 140–400)
RBC: 5 MIL/uL (ref 4.20–5.80)
RDW: 13.6 % (ref 11.0–15.0)
WBC: 5 10*3/uL (ref 3.8–10.8)

## 2015-05-24 LAB — SEDIMENTATION RATE: Sed Rate: 6 mm/hr (ref 0–20)

## 2015-05-24 LAB — C-REACTIVE PROTEIN: CRP: <0.5 mg/dL (ref <0.60)

## 2015-06-07 ENCOUNTER — Ambulatory Visit (INDEPENDENT_AMBULATORY_CARE_PROVIDER_SITE_OTHER): Payer: Self-pay | Admitting: Internal Medicine

## 2015-07-21 ENCOUNTER — Other Ambulatory Visit: Payer: Self-pay | Admitting: Physician Assistant

## 2015-07-21 NOTE — Telephone Encounter (Signed)
Rx(s) sent to pharmacy electronically.  

## 2015-07-29 ENCOUNTER — Encounter: Payer: Self-pay | Admitting: Cardiology

## 2015-07-29 ENCOUNTER — Ambulatory Visit (INDEPENDENT_AMBULATORY_CARE_PROVIDER_SITE_OTHER): Payer: 59 | Admitting: Cardiology

## 2015-07-29 VITALS — BP 121/81 | HR 77 | Ht 78.0 in | Wt 235.8 lb

## 2015-07-29 DIAGNOSIS — E781 Pure hyperglyceridemia: Secondary | ICD-10-CM

## 2015-07-29 DIAGNOSIS — E785 Hyperlipidemia, unspecified: Secondary | ICD-10-CM

## 2015-07-29 DIAGNOSIS — I214 Non-ST elevation (NSTEMI) myocardial infarction: Secondary | ICD-10-CM

## 2015-07-29 DIAGNOSIS — I2510 Atherosclerotic heart disease of native coronary artery without angina pectoris: Secondary | ICD-10-CM | POA: Diagnosis not present

## 2015-07-29 DIAGNOSIS — I10 Essential (primary) hypertension: Secondary | ICD-10-CM | POA: Diagnosis not present

## 2015-07-29 DIAGNOSIS — Z9861 Coronary angioplasty status: Secondary | ICD-10-CM

## 2015-07-29 MED ORDER — METOPROLOL TARTRATE 25 MG PO TABS: 12.5000 mg | ORAL_TABLET | Freq: Two times a day (BID) | ORAL | Status: DC

## 2015-07-29 MED ORDER — TICAGRELOR 60 MG PO TABS: 60.0000 mg | ORAL_TABLET | Freq: Two times a day (BID) | ORAL | Status: DC

## 2015-07-29 NOTE — Patient Instructions (Addendum)
Complete BRILITINA 90 MG THEN STOP  START BRILINTA 60 MG ONE TABLET TWICE A DAY   STOP ASPIRIN  DECREASE CRESTOR TO 1 TABLET TWICE A WEEK  DECREASE METOPROLOL TART TO 1/2 TABLET OF 25 MG TWICE A DAY  LABS- LIPID,CMP- NOTHING TO EAT OR DRINK THE MORNING  MAY STOP BRILINTA 5 DAYS PRIOR TO DENTAL CLEANING WITH DR Seward Grater  Your physician recommends that you schedule a follow-up appointment in CV[facility]- LIPID VISIT IN July 2017  Your physician wants you to follow-up in: [place] 2017 WITH DR Leroy. You will receive a reminder letter in the mail two months in advance. If you don't receive a letter, please call our office to schedule the follow-up appointment.

## 2015-07-29 NOTE — Progress Notes (Signed)
PCP: Sheela Stack, MD  Clinic Note: Chief Complaint  Patient presents with  . Follow-up    cramping in legs; lightedheaded; when getting up to fast  . Coronary Artery Disease    HPI: Calvin Cruz is a 55 y.o. male with a PMH below who presents today for 9 month follow-up of CAD. He has a h/o ulcerative colitis, secondary erythrocytosis and dyslipidemia who presented in transfer from [facility] and non-ST elevation MI on 07/22/2014 -- he underwent PCI to the circumflex with a Xience DES stent. He had some mild inferolateral hypokinesis on LV gram that was resolved by the time of his echocardiogram.He had some mild nerve injury from radial cath that seems to have resolved now.  Calvin Cruz was last seen on 11/17/2014. He was doing quite well at that time with no major Cruz besides the potential nerve damage to the right wrist. Dizziness improved with reducing lisinopril dose. His PCP has been checking his lipids. Was tolerating Crestor somewhat. Taking Crestor 20 mg 3 days per week - he was being followed by our pharmacist run lipid clinic.  Recent Hospitalizations: Admitted in February 2017 for bowel obstruction and enterocolitis. (He doesn't history of total abdominal colectomy for inflammatory bowel disease.)  Studies Reviewed: No new studies  Interval History: Calvin Cruz. The mediastinum he notes is that he had still has cramping in his legs on the days he takes Crestor. He also notes a little bit of orthostatic dizziness. This along with a dry cough. He actually had stopped the ACE inhibitor because of that dry cough in the last few months. I think was actually restarted from his last hospital discharge. He is slowly gaining back his strength, getting out to do activities. He is very active at work going up and down stairs walking all over, but is not really doing any routine exercise. With this level activity, he is  not having any recurrence of chest tightness or pressure with rest or exertion.   He denies PND, orthopnea or edema.  No palpitations, lightheadedness, dizziness, weakness or syncope/near syncope. No TIA/amaurosis fugax symptoms. No melena, hematochezia, hematuria, or epstaxis. But he does have some bruising. No claudication.  Cramps on full dose crestor - takes 1/2 tab M-W-Fr   ROS: A comprehensive was performed. Pertinent positives noted above Review of Systems  Constitutional: Negative for fever, chills and malaise/fatigue.  HENT: Negative for ear discharge and nosebleeds.   Eyes: Negative for blurred vision.  Respiratory: Positive for cough (Dry hacking cough improved since stopping ACE inhibitor). Negative for shortness of breath and wheezing.   Cardiovascular:       Per history of present illness  Gastrointestinal: Negative for constipation, blood in stool and melena.       GI symptoms seem to stabilize.  Musculoskeletal: Positive for joint pain.  Neurological: Positive for headaches.  Endo/Heme/Allergies: Bruises/bleeds easily.  Psychiatric/Behavioral: Negative for depression and memory loss. The patient is not nervous/anxious and does not have insomnia.   All other systems reviewed and are negative.   Past Medical History  Diagnosis Date  . Ulcerative colitis   . Gout 2014  . Fatigue 11/28/2012  . Erythrocytosis 11/28/2012  . Non-STEMI (non-ST elevated myocardial infarction) ([place]) 07/22/2014    a) RCA: OK, LM: Okay ; LAD: Proximal to mid 40%; RI: 30%;LCx: Mid to distal 90%, distal 20%, OM1 50% --> DES PCI  . CAD S/P DES PCI-circumflex 07/22/2014    PCI  mCx: PCI: 2.75 x 18 mm Xience Alpine DES to mid LCx at takeoff of OM1 (OM1 jailed 50% stenosis)  . Essential hypertension   . Hyperlipidemia with target LDL less than 70   . Hypertriglyceridemia 11/16/2014    Past Surgical History  Procedure Laterality Date  . Colectomy    . Hernia repair    . Cardiac catheterization  N/A 07/23/2014    Procedure: Left Heart Cath and Coronary Angiography;  Surgeon: Leonie Man, MD;  Location: [place] [facility];  Service: Cardiovascular;  RCA: Mild disease; LM: Okay ; LAD: Proximal to mid 40%; RI: 30%;LCx: Mid to distal 90%, distal 20%, OM1 50%; Mid inferolateral HK, EF 55-65%   . Cardiac catheterization N/A 07/23/2014    Procedure: Coronary Stent Intervention;  Surgeon: Leonie Man, MD;  Location: [place] [facility];  Service: Cardiovascular; PCI: 2.75 x 18 mm Xience Alpine DES to mid LCx at takeoff of OM1 (OM1 jailed 50% stenosis)  . Transthoracic echocardiogram  07/22/2014    EF 55-60%, normal wall motion, normal diastolic function,  no valve lesions    Prior to Admission medications   Medication Sig Start Date End Date Taking? Authorizing Provider  acetaminophen (TYLENOL) 325 MG tablet Take 650 mg by mouth every 6 (six) hours as needed for mild pain.   Yes Historical Provider, MD  acidophilus (RISAQUAD) CAPS capsule Take 2 capsules by mouth daily.   Yes Historical Provider, MD  aspirin EC 81 MG EC tablet Take 1 tablet (81 mg total) by mouth daily. 07/24/14  Yes Brett Canales, PA-C  BRILINTA 90 MG TABS tablet TAKE ONE TABLET BY MOUTH TWICE DAILY 07/21/15  Yes Leonie Man, MD  Cholecalciferol (VITAMIN D3) 5000 units CAPS Take 5,000 Units by mouth daily.   Yes Historical Provider, MD  Coenzyme Q10 (COQ10) 100 MG CAPS Take 100 mg by mouth daily. GRADUALLY INCREASE TO 300 MG DAILY 11/17/14  Yes Leonie Man, MD  diphenoxylate-atropine (LOMOTIL) 2.5-0.025 MG per tablet Take 2 tablets by mouth 2 (two) times daily. Reported on 05/17/2015   Yes Historical Provider, MD  ezetimibe (ZETIA) 10 MG tablet Take 1 tablet (10 mg total) by mouth daily. 01/20/15  Yes Leonie Man, MD  lisinopril (PRINIVIL,ZESTRIL) 2.5 MG tablet Take 1 tablet (2.5 mg total) by mouth daily. 09/01/14  No  Liliane Shi, PA-C  metoprolol tartrate (LOPRESSOR) 25 MG tablet Take 1 tablet (25 mg total) by mouth 2  (two) times daily. 07/24/14  Yes Brett Canales, PA-C  nitroGLYCERIN (NITROSTAT) 0.4 MG SL tablet Place 1 tablet (0.4 mg total) under the tongue every 5 (five) minutes x 3 doses as needed for chest pain. 07/24/14  Yes Brett Canales, PA-C  omega-3 acid ethyl esters (LOVAZA) 1 G capsule Take 2 capsules (2 g total) by mouth 2 (two) times daily. 10/15/14  Yes Leonie Man, MD  Omeprazole Magnesium (PRILOSEC OTC PO) Take 20 mg by mouth daily as needed.   Yes Historical Provider, MD  [place] Patient takes 2 by mouth daily - Vita - Sprout Capsules - Multi Vitamin   Yes Historical Provider, MD  rifaximin (XIFAXAN) 550 MG TABS tablet Take 1 tablet (550 mg total) by mouth 2 (two) times daily. Reported on 05/17/2015 05/17/15  Yes Rogene Houston, MD PRN for J pouch inflammation  rosuvastatin (CRESTOR) 40 MG tablet Take 1/2 tablet by mouth on Monday, Wednesday and Friday 10/15/14  Yes Leonie Man, MD  Turmeric Curcumin 500 MG CAPS Take 500 mg by mouth daily.   Yes Historical Provider, MD  ULORIC 40 MG tablet Take 40 mg by mouth daily. 05/09/15  Yes Historical Provider, MD    Allergies  Allergen Reactions  . Flagyl [Metronidazole] Itching  . Ciprofloxacin Itching and Nausea And Vomiting  . Doxycycline Itching and Other (See Comments)    Redness of skin, burning sensation  . Morphine And Related Itching    Social History   Social History  . Marital Status: Married    Spouse Name: N/A  . Number of Children: N/A  . Years of Education: N/A   Social History Main Topics  . Smoking status: Former Smoker    Types: Cigarettes    Quit date: 12/22/2001  . Smokeless tobacco: Never Used  . Alcohol Use: No  . Drug Use: No  . Sexual Activity: Not Asked   Other Topics Concern  . None   Social History Narrative    Family History  Problem Relation Age of Onset  . Diabetes Mother   . Coronary artery disease Mother   . Cancer Father 75    lung  . Diabetes Brother     Wt Readings  from Last 3 Encounters:  07/29/15 235 lb 12.8 oz (106.958 kg)  05/17/15 229 lb 8 oz (104.101 kg)  04/20/15 235 lb 10.8 oz (106.9 kg)    PHYSICAL EXAM BP 121/81 mmHg  Pulse 77  Ht 6\' 6"  (1.981 m)  Wt 235 lb 12.8 oz (106.958 kg)  BMI 27.25 kg/m2 General appearance: alert, cooperative, appears stated age, no distress and well-nourished and well-groomed. Neck: no adenopathy, no carotid bruit and no JVD Lungs: clear to auscultation bilaterally, normal percussion bilaterally and non-labored Heart: regular rate and rhythm, S1, S2 normal, no murmur, click, rub or gallop; nondisplaced PMI Abdomen: soft, non-tender; bowel sounds normal; no masses, no organomegaly; no HJR Extremities: extremities normal, atraumatic, no cyanosis, or edema; Pulses: 2+ and symmetric Skin: mobility and turgor normal  Neurologic: Mental status: Alert, oriented, thought content appropriate Cranial nerves: normal (II-XII grossly intact)    Adult ECG Report Not checked  Other studies Reviewed: Additional studies/ records that were reviewed today include:  Recent Labs:  PCP may have checked more recently Lab Results  Component Value Date   CHOL 171 01/07/2015   HDL 43 01/07/2015   LDLCALC 105 01/07/2015   TRIG 115 01/07/2015   CHOLHDL 4.0 01/07/2015    ASSESSMENT / PLAN: Problem List Items Addressed This Visit    NSTEMI (non-ST elevated myocardial infarction) ([place]) (Chronic)    No further chest pain episodes. Normal wall motion on echo. On stable regimen as tolerated based on his blood pressure.      Relevant Medications   metoprolol tartrate (LOPRESSOR) 25 MG tablet   Hypertriglyceridemia (Chronic)    Better on Lovaza      Relevant Medications   metoprolol tartrate (LOPRESSOR) 25 MG tablet   Other Relevant Orders   Lipid panel   Comprehensive metabolic panel   Hyperlipidemia with target LDL less than 70 - Primary (Chronic)    Triglycerides remain stable. HDL is at goal, but LDL still not at  goal. Because of his cramping will reduce to twice a week for now. His lipids are not quite well controlled, but I think we may does not do that well with statins alone. I would prefer for him to return back to the lipid clinic to see if is any other options. He is  artery taken Zetia, and Lovaza.  Question whether not Livalo will be an option versus PCSK9 inhibitors      Relevant Medications   metoprolol tartrate (LOPRESSOR) 25 MG tablet   Other Relevant Orders   Lipid panel   Comprehensive metabolic panel   Essential hypertension (Chronic)    Well-controlled on minimal medications. He had stopped his ACE inhibitor because of dizziness and cough. .No plans to restart ACE inhibitor, could consider ARB if his blood pressures to be stable.       Relevant Medications   metoprolol tartrate (LOPRESSOR) 25 MG tablet   Other Relevant Orders   Lipid panel   Comprehensive metabolic panel   CAD S/P DES PCI-circumflex (Chronic)    No other significant disease besides 50% jailed OM branch. He is now more than 1 year out from PCI  Plan is to convert from 90 mg Brilinta to 60 mg. Can stop aspirin. He is on low-dose beta blocker, but we have stopped his ACE inhibitor due to cough. - Would consider ARB if his blood pressure becomes higher, but would prefer to titrate beta blocker further  He has cut his metoprolol dose down to once a day 25 mg. When I would prefer to do is to do is 12.5 mg twice a day.   He is currently on Crestor 3 days a week. - Reducing to twice a week because of cramps.       Relevant Medications   metoprolol tartrate (LOPRESSOR) 25 MG tablet   Other Relevant Orders   Lipid panel   Comprehensive metabolic panel      Current medicines are reviewed at length with the patient today. (+/- concerns) ?? Statin Rx - with cramps; bleeding concerns on DAPT.  The following changes have been made:   Complete BRILITINA 90 MG THEN STOP: --> START BRILINTA 60 MG ONE TABLET TWICE  A DAY  MAY STOP BRILINTA 5 DAYS PRIOR TO DENTAL CLEANING WITH DR [place] TO 1 TABLET TWICE A WEEK   DECREASE METOPROLOL TART TO 1/2 TABLET OF 25 MG TWICE A DAY   LABS- LIPID,CMP- NOTHING TO EAT OR DRINK THE MORNING   Your physician recommends that you schedule a follow-up appointment in [facility]- LIPID VISIT IN July 2017  Your physician wants you to follow-up in: [place] 2017 WITH DR Clover.   Studies Ordered:   Orders Placed This Encounter  Procedures  . Lipid panel  . Comprehensive metabolic panel      Glenetta Hew, M.D., M.S. Interventional Cardiologist   Pager # [phone] Phone # [phone] [address]. [place], [place] [address]

## 2015-07-31 ENCOUNTER — Encounter: Payer: Self-pay | Admitting: Cardiology

## 2015-07-31 NOTE — Assessment & Plan Note (Signed)
No further chest pain episodes. Normal wall motion on echo. On stable regimen as tolerated based on his blood pressure.

## 2015-07-31 NOTE — Assessment & Plan Note (Signed)
Triglycerides remain stable. HDL is at goal, but LDL still not at goal. Because of his cramping will reduce to twice a week for now. His lipids are not quite well controlled, but I think we may does not do that well with statins alone. I would prefer for him to return back to the lipid clinic to see if is any other options. He is artery taken Zetia, and Lovaza.  Question whether not Livalo will be an option versus PCSK9 inhibitors

## 2015-07-31 NOTE — Assessment & Plan Note (Addendum)
No other significant disease besides 50% jailed OM branch. He is now more than 1 year out from PCI  Plan is to convert from 90 mg Brilinta to 60 mg. Can stop aspirin. He is on low-dose beta blocker, but we have stopped his ACE inhibitor due to cough. - Would consider ARB if his blood pressure becomes higher, but would prefer to titrate beta blocker further  He has cut his metoprolol dose down to once a day 25 mg. When I would prefer to do is to do is 12.5 mg twice a day.   He is currently on Crestor 3 days a week. - Reducing to twice a week because of cramps.

## 2015-07-31 NOTE — Assessment & Plan Note (Signed)
Better on Lovaza

## 2015-07-31 NOTE — Assessment & Plan Note (Signed)
Well-controlled on minimal medications. He had stopped his ACE inhibitor because of dizziness and cough. .No plans to restart ACE inhibitor, could consider ARB if his blood pressures to be stable.

## 2015-08-04 ENCOUNTER — Other Ambulatory Visit: Payer: Self-pay | Admitting: Cardiology

## 2015-08-04 NOTE — Telephone Encounter (Signed)
Rx(s) sent to pharmacy electronically.  

## 2015-09-09 ENCOUNTER — Ambulatory Visit (INDEPENDENT_AMBULATORY_CARE_PROVIDER_SITE_OTHER): Payer: 59 | Admitting: Pharmacist

## 2015-09-09 VITALS — Ht 78.0 in | Wt 238.2 lb

## 2015-09-09 DIAGNOSIS — E785 Hyperlipidemia, unspecified: Secondary | ICD-10-CM

## 2015-09-09 DIAGNOSIS — E781 Pure hyperglyceridemia: Secondary | ICD-10-CM | POA: Diagnosis not present

## 2015-09-09 MED ORDER — ROSUVASTATIN CALCIUM 10 MG PO TABS: ORAL_TABLET | ORAL | Status: DC

## 2015-09-09 NOTE — Patient Instructions (Addendum)
For CoQ 10 tablet stay at current dose  For Lovaza take 1 capsule once a day and slowly increase to 2 capsules twice a day. Try putting in the freezer to help with burping  Start taking Crestor 10mg  twice weekly.   Call [phone] if you have questions or any issues.

## 2015-09-09 NOTE — Progress Notes (Signed)
Patient ID: Calvin Cruz                 DOB: 1960/09/27                    MRN: [id number]     HPI: Calvin Cruz is a 55 y.o. male of Dr. Ellyn Hack here for lipid follow-up. He recently had labs done at his primary care office; see below for lipid panel. He was started on Jardiance for his diabetes and hopefully this will help with his TG as well.   He reports he is still struggling with myalgias in his legs and right arm.   He reports that he has been slowly adding Lovaza back to his regimen but it has caused him to have terrible fishy burps and he never had that problem before.   Current Medications:  Crestor 20mg  twice weekly on Monday and Friday Lovaza - slowly building to 2g BID but has been taking 2g once daily  Intolerances: Lipitor 80mg , fenofibrate 160mg   Risk Factors: CAD s/p PCI, DM LDL goal: LDL < 70 mg/dL, non-HDL <100 mg/dL  Diet:  Breakfast- Nutragrain bar, water Lunch- grilled chicken salad, fried catfish and slaw, chicken salad, bison burger  Dinner- Wife cooks.  She is a type 1 diabetic so watches carbohydrates and sugars.  Snack- peanut butter and graham crackers  Exercise:  Cardiac rehab 3 days of the week  Family History:  Mother- high TG Dad- had heart problems but does not know what kind Uncle- MI in 67s  Social History: No alcohol.  Former smoker- quit in March 2003  Labs: 09/02/15 TC 146  TG 300  HDL 34  LDL 52  Non-HDL 112   Past Medical History  Diagnosis Date  . Ulcerative colitis   . Gout 2014  . Fatigue 11/28/2012  . Erythrocytosis 11/28/2012  . Non-STEMI (non-ST elevated myocardial infarction) ([facility] [facility]) 07/22/2014    a) RCA: OK, LM: Okay ; LAD: Proximal to mid 40%; RI: 30%;LCx: Mid to distal 90%, distal 20%, OM1 50% --> DES PCI  . CAD S/P DES PCI-circumflex 07/22/2014    PCI mCx: PCI: 2.75 x 18 mm Xience Alpine DES to mid LCx at takeoff of OM1 (OM1 jailed 50% stenosis)  . Essential hypertension   . Hyperlipidemia with target LDL less  than 70   . Hypertriglyceridemia 11/16/2014    Current Outpatient Prescriptions on File Prior to Visit  Medication Sig Dispense Refill  . acetaminophen (TYLENOL) 325 MG tablet Take 650 mg by mouth every 6 (six) hours as needed for mild pain.    Marland Kitchen acidophilus (RISAQUAD) CAPS capsule Take 2 capsules by mouth daily.    . Cholecalciferol (VITAMIN D3) 5000 units CAPS Take 5,000 Units by mouth daily.    . Coenzyme Q10 (COQ10) 100 MG CAPS Take 100 mg by mouth daily. GRADUALLY INCREASE TO 300 MG DAILY 90 each 11  . ezetimibe (ZETIA) 10 MG tablet Take 1 tablet (10 mg total) by mouth daily. 30 tablet 11  . metoprolol tartrate (LOPRESSOR) 25 MG tablet Take 0.5 tablets (12.5 mg total) by mouth 2 (two) times daily. 90 tablet 3  . omega-3 acid ethyl esters (LOVAZA) 1 g capsule TAKE TWO CAPSULES BY MOUTH TWICE DAILY 120 capsule 10  . OVER THE COUNTER MEDICATION Patient takes 2 by mouth daily - Vita - Sprout Capsules - Multi Vitamin    . ticagrelor (BRILINTA) 60 MG TABS tablet Take 1 tablet (60 mg total) by mouth  2 (two) times daily. 180 tablet 3  . Turmeric Curcumin 500 MG CAPS Take 500 mg by mouth daily.    Marland Kitchen ULORIC 40 MG tablet Take 40 mg by mouth daily.  1  . diphenoxylate-atropine (LOMOTIL) 2.5-0.025 MG per tablet Take 2 tablets by mouth 2 (two) times daily. Reported on 09/09/2015    . nitroGLYCERIN (NITROSTAT) 0.4 MG SL tablet Place 1 tablet (0.4 mg total) under the tongue every 5 (five) minutes x 3 doses as needed for chest pain. (Patient not taking: Reported on 09/09/2015) 25 tablet 12  . Omeprazole Magnesium (PRILOSEC OTC PO) Take 20 mg by mouth daily as needed. Reported on 09/09/2015    . rifaximin (XIFAXAN) 550 MG TABS tablet Take 1 tablet (550 mg total) by mouth 2 (two) times daily. Reported on 05/17/2015 (Patient not taking: Reported on 09/09/2015) 28 tablet 1   No current facility-administered medications on file prior to visit.    Allergies  Allergen Reactions  . Flagyl [Metronidazole] Itching    . Ciprofloxacin Itching and Nausea And Vomiting  . Doxycycline Itching and Other (See Comments)    Redness of skin, burning sensation  . Morphine And Related Itching    Assessment/Plan: Hyperlipidemia: LDL at goal <70. At the time of these labs he had been taking Crestor 20mg  twice weekly for approximately 2 months. Since he has been struggling with myalgias will decrease dose to 10mg  twice weekly and see how LDL responds in 3 months. Suggested putting Lovaza in freezer to help with fishy burps. Encouraged him to continue to increase dose back to 4g daily. At this point I do not believe he is a candidate for PCSK9i, but will continue to follow and make adjustments to maximize benefit and minimize side effects. Follow-up labs and in lipid clinic in 3 months.   Thank you, Lelan Pons. Patterson Hammersmith, [facility]  09/09/2015 3:58 PM

## 2015-09-11 ENCOUNTER — Encounter: Payer: Self-pay | Admitting: Pharmacist

## 2015-10-07 ENCOUNTER — Telehealth: Payer: Self-pay | Admitting: Pharmacist

## 2015-10-07 MED ORDER — ROSUVASTATIN CALCIUM 10 MG PO TABS: ORAL_TABLET | ORAL | 1 refills | Status: DC

## 2015-10-07 NOTE — Telephone Encounter (Signed)
Pt wife LM that Crestor was filled for 40mg  by [facility].   Resend current dose with instructions to cancel other strengths at this time.   LM that current dose resent with above.

## 2015-11-03 ENCOUNTER — Other Ambulatory Visit: Payer: Self-pay

## 2015-11-03 MED ORDER — NITROGLYCERIN 0.4 MG SL SUBL: 0.4000 mg | SUBLINGUAL_TABLET | SUBLINGUAL | 12 refills | Status: DC | PRN

## 2015-11-21 ENCOUNTER — Telehealth: Payer: Self-pay | Admitting: Pharmacist

## 2015-11-21 NOTE — Telephone Encounter (Signed)
LMTCB about lipid panel and follow up appt.

## 2015-11-21 NOTE — Telephone Encounter (Signed)
Spoke to Mrs. Hileman and instructed to have pt go to lab in 2 weeks.   Orders mailed will call to schedule appt after labs received.

## 2015-11-22 ENCOUNTER — Encounter (INDEPENDENT_AMBULATORY_CARE_PROVIDER_SITE_OTHER): Payer: Self-pay | Admitting: Internal Medicine

## 2015-11-22 ENCOUNTER — Ambulatory Visit (INDEPENDENT_AMBULATORY_CARE_PROVIDER_SITE_OTHER): Payer: 59 | Admitting: Internal Medicine

## 2015-11-22 VITALS — BP 110/80 | HR 78 | Temp 98.1°F | Resp 18 | Ht 78.0 in | Wt 235.8 lb

## 2015-11-22 DIAGNOSIS — R197 Diarrhea, unspecified: Secondary | ICD-10-CM

## 2015-11-22 MED ORDER — DIPHENOXYLATE-ATROPINE 2.5-0.025 MG PO TABS: 1.0000 | ORAL_TABLET | Freq: Four times a day (QID) | ORAL | 2 refills | Status: AC | PRN

## 2015-11-22 NOTE — Progress Notes (Signed)
Presenting complaint;  Follow-up for diarrhea and history of enteritis. Patient has ileal pouch.  Database and Subjective:  Patient is 55 year old Caucasian male who presented back in March 2003 with bloody diarrhea. Colonoscopy revealed severe pan colitis. Patient was hospitalized lesion following colonoscopy and did not respond to therapy. He was transferred to [facility] and underwent proctocolectomy with ileostomy and ileal pouch. Ileostomy with subsequent taken down. He has done well except for sporadic episodes of pouchitis. He was hospitalized at [facility] in February this year for abdominal pain nausea vomiting. He was felt to have enteritis with severe ileus and required NG tube and he responded to antibiotic antibiotic therapy. His symptoms have not relapse. He has prescription for Xifaxan for pouchitis but he has not needed it for several months. He is having 6-8 stools per day. Most of his stools are loose. He has noted some benefit with Lomotil but he develops blurred vision. He has been taking 2 tablets twice a day in the past. He denies melena or rectal bleeding or hypogastric pain. Lately she's been struggling with gouty arthritis involving multiple joints. One of his prescriptions his coaches seen but even single dose makes his diarrhea worse and he has urgency and incontinence. His appetite is good. He has gained 6 pounds since his last visit of 05/17/2015.    Current Medications: Outpatient Encounter Prescriptions as of 11/22/2015  Medication Sig  . acetaminophen (TYLENOL) 325 MG tablet Take 650 mg by mouth every 6 (six) hours as needed for mild pain.  Marland Kitchen acidophilus (RISAQUAD) CAPS capsule Take 2 capsules by mouth daily.  . Cholecalciferol (VITAMIN D3) 5000 units CAPS Take 5,000 Units by mouth daily.  . Coenzyme Q10 (COQ10) 100 MG CAPS Take 100 mg by mouth daily. GRADUALLY INCREASE TO 300 MG DAILY (Patient taking differently: Take 100 mg by mouth daily. )  . colchicine 0.6 MG tablet Take 0.6  mg by mouth as needed.   . diclofenac sodium (VOLTAREN) 1 % GEL Apply 2 g topically as needed.   . diphenoxylate-atropine (LOMOTIL) 2.5-0.025 MG per tablet Take 2 tablets by mouth 2 (two) times daily. Reported on 09/09/2015  . empagliflozin (JARDIANCE) 10 MG TABS tablet Take 10 mg by mouth daily.  Marland Kitchen ezetimibe (ZETIA) 10 MG tablet Take 1 tablet (10 mg total) by mouth daily.  . metoprolol tartrate (LOPRESSOR) 25 MG tablet Take 0.5 tablets (12.5 mg total) by mouth 2 (two) times daily.  . nitroGLYCERIN (NITROSTAT) 0.4 MG SL tablet Place 1 tablet (0.4 mg total) under the tongue every 5 (five) minutes x 3 doses as needed for chest pain.  Marland Kitchen omega-3 acid ethyl esters (LOVAZA) 1 g capsule TAKE TWO CAPSULES BY MOUTH TWICE DAILY  . Omeprazole Magnesium (PRILOSEC OTC PO) Take 20 mg by mouth daily as needed. Reported on 09/09/2015  . OVER THE COUNTER MEDICATION Patient takes 2 by mouth daily - Vita - Sprout Capsules - Multi Vitamin  . OVER THE COUNTER MEDICATION Go- [place]. Angel Vitamins- Patient takes by mouth in the morning and 1 by mouth in the evening.  . rifaximin (XIFAXAN) 550 MG TABS tablet Take 1 tablet (550 mg total) by mouth 2 (two) times daily. Reported on 05/17/2015  . rosuvastatin (CRESTOR) 10 MG tablet Take 1 tablet twice weekly on Monday and Friday  . ticagrelor (BRILINTA) 60 MG TABS tablet Take 1 tablet (60 mg total) by mouth 2 (two) times daily.  . Turmeric Curcumin 500 MG CAPS Take 500 mg by mouth daily.  Marland Kitchen  ULORIC 40 MG tablet Take 40 mg by mouth daily.   No facility-administered encounter medications on file as of 11/22/2015.      Objective: Blood pressure 110/80, pulse 78, temperature 98.1 F (36.7 C), temperature source Oral, resp. rate 18, height 6\' 6"  (1.981 m), weight 235 lb 12.8 oz (107 kg). Patient is alert and in no acute distress. Conjunctiva is pink. Sclera is nonicteric Oropharyngeal mucosa is normal. No neck masses or thyromegaly noted. Cardiac exam with regular rhythm  normal S1 and S2. No murmur or gallop noted. Lungs are clear to auscultation. Abdomen is symmetrical. Soft and nontender without organomegaly or masses. No LE edema or clubbing noted.   Assessment:  #1. Chronic diarrhea secondary to loss of colon secondary due to fulminant ulcerative colitis in 2003.Marland Kitchen He has small ileal pouch. He can try diphenoxylate at low dose. If he is not able to tolerate diphenoxylate will consider other treatment options including loperamide. #2. History of pouchitis. He has not required antibiotic therapy in over 7 months.   Plan:  Diphenoxylate 1 tablet 4 times a day when necessary. Patient will call if he is unable to tolerate diphenoxylate. Office visit in 6 months.

## 2015-11-22 NOTE — Patient Instructions (Signed)
Notify if you experience side effects with diphenoxylate or Lomotil.

## 2015-12-13 LAB — COMPREHENSIVE METABOLIC PANEL
ALT: 27 U/L (ref 9–46)
AST: 24 U/L (ref 10–35)
Albumin: 4.9 g/dL (ref 3.6–5.1)
Alkaline Phosphatase: 54 U/L (ref 40–115)
BUN: 10 mg/dL (ref 7–25)
CHLORIDE: 102 mmol/L (ref 98–110)
CO2: 26 mmol/L (ref 20–31)
CREATININE: 0.72 mg/dL (ref 0.70–1.33)
Calcium: 9.7 mg/dL (ref 8.6–10.3)
GLUCOSE: 142 mg/dL — AB (ref 65–99)
Potassium: 4.6 mmol/L (ref 3.5–5.3)
SODIUM: 140 mmol/L (ref 135–146)
TOTAL PROTEIN: 7.2 g/dL (ref 6.1–8.1)
Total Bilirubin: 0.9 mg/dL (ref 0.2–1.2)

## 2015-12-13 LAB — LIPID PANEL
Cholesterol: 181 mg/dL (ref 125–200)
HDL: 30 mg/dL — AB (ref >=40)
Total CHOL/HDL Ratio: 6 Ratio — ABNORMAL HIGH (ref <=5.0)
Triglycerides: 419 mg/dL — ABNORMAL HIGH (ref <150)

## 2015-12-13 LAB — LDL CHOLESTEROL, DIRECT: Direct LDL: 93 mg/dL (ref <130)

## 2015-12-15 ENCOUNTER — Telehealth: Payer: Self-pay | Admitting: *Deleted

## 2015-12-15 NOTE — Telephone Encounter (Signed)
-----   Message from Leonie Man, MD sent at 12/14/2015  5:58 PM EDT ----- Lipid panel: Stable cholesterol 181. HDL is down to 43. Unable to calculate LDL because the triglycerides are over 400. This has a lot to do with diet, but we need to reduce this. Continue Lovaza, we'll need to add fenofibrate - let's start with 48mg . -- Recheck in 3 months  Chemistry panel only notable for elevated glucose at 142. Otherwise normal kidney function and liver function.  Glenetta Hew, MD

## 2015-12-15 NOTE — Telephone Encounter (Signed)
called left message - Lipid clinic will call to set up appointment

## 2015-12-22 NOTE — Telephone Encounter (Signed)
LMTCB about setting up lipid appt per note below.

## 2015-12-22 NOTE — Telephone Encounter (Signed)
Spoke with pt's wife about lipid panel. They were unaware of results. I gave them results over the phone.   She states it would be difficult for him to come in for an appt due to work schedule and potential lay offs at work.   We discussed recommendations over the phone. Will have him increase his fish oil again to 2g BID (he has been taking 1g BID). He has not tolerated fenofibrate in the past per wife's report due to muscle weakness. Have also asked that he discuss with primary doc increase of jardiance to help with TG as his BG has been uncontrolled. He has an appt with primary doctor next week and wife will ask about increasing dose.   Will order repeat labs for about 3 months. She requests that we call the mobile phone number or work number listed as the home number is no longer in service. Pt's wife stated understanding and appreciation.

## 2016-03-13 ENCOUNTER — Emergency Department (HOSPITAL_COMMUNITY)
Admission: EM | Admit: 2016-03-13 | Discharge: 2016-03-13 | Disposition: A | Discharge status: Home / Self Care | Payer: 59 | Attending: Emergency Medicine | Admitting: Emergency Medicine

## 2016-03-13 ENCOUNTER — Encounter (HOSPITAL_COMMUNITY): Payer: Self-pay | Admitting: *Deleted

## 2016-03-13 ENCOUNTER — Other Ambulatory Visit: Payer: Self-pay

## 2016-03-13 DIAGNOSIS — I2510 Atherosclerotic heart disease of native coronary artery without angina pectoris: Secondary | ICD-10-CM | POA: Diagnosis not present

## 2016-03-13 DIAGNOSIS — I10 Essential (primary) hypertension: Secondary | ICD-10-CM | POA: Diagnosis not present

## 2016-03-13 DIAGNOSIS — I252 Old myocardial infarction: Secondary | ICD-10-CM | POA: Insufficient documentation

## 2016-03-13 DIAGNOSIS — Z87891 Personal history of nicotine dependence: Secondary | ICD-10-CM | POA: Insufficient documentation

## 2016-03-13 LAB — CBC WITH DIFFERENTIAL/PLATELET
Basophils Absolute: 0.1 10*3/uL (ref 0.0–0.1)
Basophils Relative: 1 %
EOS ABS: 0 10*3/uL (ref 0.0–0.7)
EOS PCT: 1 %
HCT: 44.7 % (ref 39.0–52.0)
Hemoglobin: 16.4 g/dL (ref 13.0–17.0)
LYMPHS ABS: 3.2 10*3/uL (ref 0.7–4.0)
LYMPHS PCT: 47 %
MCH: 32 pg (ref 26.0–34.0)
MCHC: 36.7 g/dL — AB (ref 30.0–36.0)
MCV: 87.3 fL (ref 78.0–100.0)
MONO ABS: 0.5 10*3/uL (ref 0.1–1.0)
MONOS PCT: 8 %
Neutro Abs: 2.9 10*3/uL (ref 1.7–7.7)
Neutrophils Relative %: 43 %
PLATELETS: 180 10*3/uL (ref 150–400)
RBC: 5.12 MIL/uL (ref 4.22–5.81)
RDW: 12.2 % (ref 11.5–15.5)
WBC: 6.7 10*3/uL (ref 4.0–10.5)

## 2016-03-13 LAB — BASIC METABOLIC PANEL
Anion gap: 9 (ref 5–15)
BUN: 19 mg/dL (ref 6–20)
CO2: 23 mmol/L (ref 22–32)
CREATININE: 0.89 mg/dL (ref 0.61–1.24)
Calcium: 9.5 mg/dL (ref 8.9–10.3)
Chloride: 104 mmol/L (ref 101–111)
GFR calc Af Amer: >60 mL/min (ref >60)
GLUCOSE: 175 mg/dL — AB (ref 65–99)
POTASSIUM: 3.6 mmol/L (ref 3.5–5.1)
Sodium: 136 mmol/L (ref 135–145)

## 2016-03-13 LAB — TROPONIN I: Troponin I: <0.03 ng/mL (ref <0.03)

## 2016-03-13 MED ORDER — OMEGA-3-ACID ETHYL ESTERS 1 G PO CAPS: 2.0000 | ORAL_CAPSULE | Freq: Two times a day (BID) | ORAL | 6 refills | Status: DC

## 2016-03-13 NOTE — Discharge Instructions (Signed)
You may increase your metoprolol to 25 mg twice daily. This could cause dizziness or lightheadedness. Keep a record of your blood pressure and followup with Dr. Ellyn Hack. Return to the ED if you develop chest pain, shortness of breath, or any other concerns.

## 2016-03-13 NOTE — ED Provider Notes (Signed)
[facility] DEPT Provider Note   CSN: [id number] Arrival date & time: 03/13/16  0142     History   Chief Complaint Chief Complaint  Patient presents with  . Hypertension    HPI Calvin Cruz is a 56 y.o. male.  Patient presents to ED with concerns for hypertension. States he was checking his blood pressure at work and found in the 0000000 systolic range. He states he was just checking to see what it isn't felt well. He has been under stress at work because he is getting laid-off next month. He states compliance with his metoprolol that he takes for blood pressure twice daily. States his blood pressure is normally in the 123456 to 0000000 systolic range. He denies any chest pain, shortness of breath, nausea or vomiting. Denies headache. Denies visual changes. Denies focal weakness, numbness or tingling. Denies bowel or bladder incontinence. Patient with history of MI in June 2016 and has one stent. Also has history of total colectomy and Hartman's pouch due to ulcerative colitis. Denies any dizziness or lightheadedness. Denies any headache or visual changes.   The history is provided by the patient and the spouse.  Hypertension  Pertinent negatives include no chest pain, no abdominal pain, no headaches and no shortness of breath.    Past Medical History:  Diagnosis Date  . CAD S/P DES PCI-circumflex 07/22/2014   PCI mCx: PCI: 2.75 x 18 mm Xience Alpine DES to mid LCx at takeoff of OM1 (OM1 jailed 50% stenosis)  . Erythrocytosis 11/28/2012  . Essential hypertension   . Fatigue 11/28/2012  . Gout 2014  . Hyperlipidemia with target LDL less than 70   . Hypertriglyceridemia 11/16/2014  . Non-STEMI (non-ST elevated myocardial infarction) ([place]) 07/22/2014   a) RCA: OK, LM: Okay ; LAD: Proximal to mid 40%; RI: 30%;LCx: Mid to distal 90%, distal 20%, OM1 50% --> DES PCI  . Ulcerative colitis     Patient Active Problem List   Diagnosis Date Noted  . Enterocolitis 04/20/2015  .  Hypertriglyceridemia 11/16/2014  . Hyperlipidemia with target LDL less than 70   . Essential hypertension   . NSTEMI (non-ST elevated myocardial infarction) ([place]) 07/22/2014  . Ulcerative colitis ([place]) 07/22/2014  . CAD S/P DES PCI-circumflex 07/22/2014  . Pouchitis ([place]) 12/22/2012  . Ventral hernia 12/22/2012  . Hypogonadism male 12/22/2012  . Fatigue 11/28/2012  . Erythrocytosis 11/28/2012  . H/O resection of large bowel 11/28/2012    Past Surgical History:  Procedure Laterality Date  . CARDIAC CATHETERIZATION N/A 07/23/2014   Procedure: Left Heart Cath and Coronary Angiography;  Surgeon: Leonie Man, MD;  Location: [facility];  Service: Cardiovascular;  RCA: Mild disease; LM: Okay ; LAD: Proximal to mid 40%; RI: 30%;LCx: Mid to distal 90%, distal 20%, OM1 50%; Mid inferolateral HK, EF 55-65%   . CARDIAC CATHETERIZATION N/A 07/23/2014   Procedure: Coronary Stent Intervention;  Surgeon: Leonie Man, MD;  Location: [facility];  Service: Cardiovascular; PCI: 2.75 x 18 mm Xience Alpine DES to mid LCx at takeoff of OM1 (OM1 jailed 50% stenosis)  . COLECTOMY    . HERNIA REPAIR    . TRANSTHORACIC ECHOCARDIOGRAM  07/22/2014   EF 55-60%, normal wall motion, normal diastolic function,  no valve lesions        Home Medications    Prior to Admission medications   Medication Sig Start Date End Date Taking? Authorizing Provider  acetaminophen (TYLENOL) 325 MG tablet Take 650 mg by mouth  every 6 (six) hours as needed for mild pain.    Historical Provider, MD  acidophilus (RISAQUAD) CAPS capsule Take 2 capsules by mouth daily.    Historical Provider, MD  Cholecalciferol (VITAMIN D3) 5000 units CAPS Take 5,000 Units by mouth daily.    Historical Provider, MD  Coenzyme Q10 (COQ10) 100 MG CAPS Take 100 mg by mouth daily. GRADUALLY INCREASE TO 300 MG DAILY Patient taking differently: Take 100 mg by mouth daily.  11/17/14   Leonie Man, MD  colchicine 0.6 MG tablet Take 0.6  mg by mouth as needed.  11/04/15   Historical Provider, MD  diclofenac sodium (VOLTAREN) 1 % GEL Apply 2 g topically as needed.  09/14/15   Historical Provider, MD  diphenoxylate-atropine (LOMOTIL) 2.5-0.025 MG tablet Take 1 tablet by mouth 4 (four) times daily as needed for diarrhea or loose stools. Reported on 09/09/2015 11/22/15   Rogene Houston, MD  empagliflozin (JARDIANCE) 10 MG TABS tablet Take 10 mg by mouth daily.    Historical Provider, MD  ezetimibe (ZETIA) 10 MG tablet Take 1 tablet (10 mg total) by mouth daily. 01/20/15   Leonie Man, MD  metoprolol tartrate (LOPRESSOR) 25 MG tablet Take 0.5 tablets (12.5 mg total) by mouth 2 (two) times daily. 07/29/15   Leonie Man, MD  nitroGLYCERIN (NITROSTAT) 0.4 MG SL tablet Place 1 tablet (0.4 mg total) under the tongue every 5 (five) minutes x 3 doses as needed for chest pain. 11/03/15   Leonie Man, MD  omega-3 acid ethyl esters (LOVAZA) 1 g capsule TAKE TWO CAPSULES BY MOUTH TWICE DAILY 08/04/15   Leonie Man, MD  Omeprazole Magnesium (PRILOSEC OTC PO) Take 20 mg by mouth daily as needed. Reported on 09/09/2015    Historical Provider, MD  [place] Patient takes 2 by mouth daily - Vita - Sprout Capsules - Multi Vitamin    Historical Provider, MD  OVER THE COUNTER MEDICATION Go- [place]. Angel Vitamins- Patient takes by mouth in the morning and 1 by mouth in the evening.    Historical Provider, MD  rifaximin (XIFAXAN) 550 MG TABS tablet Take 1 tablet (550 mg total) by mouth 2 (two) times daily. Reported on 05/17/2015 05/17/15   Rogene Houston, MD  rosuvastatin (CRESTOR) 10 MG tablet Take 1 tablet twice weekly on Monday and Friday 10/07/15   Leonie Man, MD  ticagrelor (BRILINTA) 60 MG TABS tablet Take 1 tablet (60 mg total) by mouth 2 (two) times daily. 07/29/15   Leonie Man, MD  Turmeric Curcumin 500 MG CAPS Take 500 mg by mouth daily.    Historical Provider, MD  ULORIC 40 MG tablet Take 40 mg by mouth daily.  05/09/15   Historical Provider, MD    Family History Family History  Problem Relation Age of Onset  . Diabetes Mother   . Coronary artery disease Mother   . Cancer Father 25    lung  . Diabetes Brother     Social History Social History  Substance Use Topics  . Smoking status: Former Smoker    Types: Cigarettes    Quit date: 12/22/2001  . Smokeless tobacco: Never Used  . Alcohol use No     Allergies   Flagyl [metronidazole]; Ciprofloxacin; Doxycycline; and Morphine and related   Review of Systems Review of Systems  Constitutional: Negative for activity change, appetite change, fatigue and fever.  HENT: Negative for congestion and rhinorrhea.   Eyes: Negative for  visual disturbance.  Respiratory: Negative for cough, chest tightness and shortness of breath.   Cardiovascular: Negative for chest pain.  Gastrointestinal: Negative for abdominal pain, nausea and vomiting.  Genitourinary: Negative for dysuria and hematuria.  Musculoskeletal: Negative for arthralgias and myalgias.  Neurological: Negative for dizziness, weakness, light-headedness and headaches.  Psychiatric/Behavioral: The patient is nervous/anxious.    A complete 10 system review of systems was obtained and all systems are negative except as noted in the HPI and PMH.    Physical Exam Updated Vital Signs BP 142/97   Pulse 82   Temp 97.7 F (36.5 C) (Oral)   Resp 14   Ht 6\' 6"  (1.981 m)   Wt 240 lb (108.9 kg)   SpO2 96%   BMI 27.73 kg/m   Physical Exam  Constitutional: He is oriented to person, place, and time. He appears well-developed and well-nourished. No distress.  HENT:  Head: Normocephalic and atraumatic.  Mouth/Throat: Oropharynx is clear and moist. No oropharyngeal exudate.  Eyes: Conjunctivae and EOM are normal. Pupils are equal, round, and reactive to light.  Neck: Normal range of motion. Neck supple.  No meningismus.  Cardiovascular: Normal rate, regular rhythm, normal heart sounds and  intact distal pulses.   No murmur heard. Pulmonary/Chest: Effort normal and breath sounds normal. No respiratory distress.  Abdominal: Soft. There is no tenderness. There is no rebound and no guarding.  Multiple well healed incisions  Musculoskeletal: Normal range of motion. He exhibits no edema or tenderness.  Neurological: He is alert and oriented to person, place, and time. No cranial nerve deficit. He exhibits normal muscle tone. Coordination normal.  No ataxia on finger to nose bilaterally. No pronator drift. 5/5 strength throughout. CN 2-12 intact.Equal grip strength. Sensation intact.   Skin: Skin is warm.  Psychiatric: He has a normal mood and affect. His behavior is normal.  Nursing note and vitals reviewed.    ED Treatments / Results  Labs (all labs ordered are listed, but only abnormal results are displayed) Labs Reviewed  CBC WITH DIFFERENTIAL/PLATELET - Abnormal; Notable for the following:       Result Value   MCHC 36.7 (*)    All other components within normal limits  BASIC METABOLIC PANEL - Abnormal; Notable for the following:    Glucose, Bld 175 (*)    All other components within normal limits  TROPONIN I    EKG  EKG Interpretation  Date/Time:  Tuesday March 13 2016 01:56:15 EST Ventricular Rate:  85 PR Interval:    QRS Duration: 103 QT Interval:  372 QTC Calculation: 443 R Axis:   58 Text Interpretation:  Sinus rhythm No significant change was found Confirmed by Wyvonnia Dusky  MD, Daquan Crapps ([id number]) on 03/13/2016 2:03:15 AM       Radiology No results found.  Procedures Procedures (including critical care time)  Medications Ordered in ED Medications - No data to display   Initial Impression / Assessment and Plan / ED Course  I have reviewed the triage vital signs and the nursing notes.  Pertinent labs & imaging results that were available during my care of the patient were reviewed by me and considered in my medical decision making (see chart for  details).     Patient presents with concerns for hypertension. Blood pressure 141/89 on arrival to the ED. Denies any symptoms. No chest pain, headache, visual changes.  Exam is nonfocal. EKG is normal sinus rhythm. Labs are unremarkable without evidence of end organ damage.  Discussed with patient  to keep a blood pressure log and follow up with his cardiologist for further adjustments of his BP regimen. Return precautions discussed. BP improved to 128/93 in the ED.   Final Clinical Impressions(s) / ED Diagnoses   Final diagnoses:  Hypertension, unspecified type    New Prescriptions New Prescriptions   No medications on file     Ezequiel Essex, MD 03/13/16 [phone]

## 2016-03-13 NOTE — ED Triage Notes (Signed)
Pt c/o high blood pressure tonight; pt states his pressure has been 180/100's; pt denies any chest pain; pt states he is under stress due to being laid off from work next month

## 2016-03-14 ENCOUNTER — Telehealth: Payer: Self-pay | Admitting: Cardiology

## 2016-03-14 NOTE — Telephone Encounter (Signed)
Pt went to the ER on Tuesday morning,pt's blood pressure was high.She wants to know if he needs to increase his Metoprolol?

## 2016-03-14 NOTE — Telephone Encounter (Signed)
Spoke to caller, wife (DPR) of patient, after I had opportunity to review notes. She informs me, and this was mentioned in ED notes, that patient has found out he will be placed out of work permanently by his employer next month. Due to this he's been under a lot of stress. She notes he checks his readings for BP at work, and had done so the other night, recorded several elevated readings. He was checking hourly and BP seemed to get higher each time. Went to ED for evaluation but had already taken an extra 1/2 tab of metoprolol (he routinely takes 1/2 25mg  tab (12.5mg ) metoprolol BID) and his BP returned to baseline.  Wife asking if he needs further med adjustment. I advised no, follow BPs for a week or two, ensure patient checks his BP at rest. Recommended he not check at work or if he does, also check an AM/early afternoon reading at home (he works 2nd shift) prior to going to work. Advised to call back in a week or two w readings. If still elevated, may warrant adjustments. She asked if he got a reading similar to his highest reading (180/110) again, would we be OK w him taking extra 1/2 tab of metoprolol again? Notes he used to be on 25mg  BID but had issues w fatigue/SOB at that dose. Advised that should be OK but of course to monitor for response. Will verify w Dr. Ellyn Hack.

## 2016-03-15 NOTE — Telephone Encounter (Signed)
Advice communicated. Wife notes he's not on lisinopril currently, unsure if this med was causing dry cough but he was instructed to hold it completely some time ago. He still has the dry cough occasionally even though he is off ACE. Advised to call back in a week w readings, will readdress at that time.

## 2016-03-15 NOTE — Telephone Encounter (Signed)
I think for PRN temporary increase of BP - taking additional 12.5 mg BB is OK.  If BPs stay up - lets just increase Lisinopril to 5 mg -- he did not do well with increased BB.  Glenetta Hew, MD

## 2016-04-07 ENCOUNTER — Other Ambulatory Visit (INDEPENDENT_AMBULATORY_CARE_PROVIDER_SITE_OTHER): Payer: Self-pay | Admitting: Internal Medicine

## 2016-04-07 ENCOUNTER — Telehealth (INDEPENDENT_AMBULATORY_CARE_PROVIDER_SITE_OTHER): Payer: Self-pay | Admitting: Internal Medicine

## 2016-04-07 MED ORDER — ONDANSETRON HCL 4 MG PO TABS: 4.0000 mg | ORAL_TABLET | Freq: Three times a day (TID) | ORAL | 0 refills | Status: DC | PRN

## 2016-04-07 NOTE — Telephone Encounter (Signed)
Patient's wife called stating that he developed cold and congestion that he has developed diarrhea. He is afebrile. He's been nauseated. He is concerned that he may have pouchitis. Patient already has started Xifaxan which she will continue twice a day for 5-7 days. Prescription for ondansetron sent to his pharmacy.

## 2016-04-10 ENCOUNTER — Emergency Department (HOSPITAL_COMMUNITY): Payer: 59

## 2016-04-10 ENCOUNTER — Ambulatory Visit (INDEPENDENT_AMBULATORY_CARE_PROVIDER_SITE_OTHER): Payer: 59 | Admitting: Internal Medicine

## 2016-04-10 ENCOUNTER — Emergency Department (HOSPITAL_COMMUNITY)
Admission: EM | Admit: 2016-04-10 | Discharge: 2016-04-10 | Disposition: A | Discharge status: Home / Self Care | Payer: 59 | Attending: Emergency Medicine | Admitting: Emergency Medicine

## 2016-04-10 ENCOUNTER — Encounter (HOSPITAL_COMMUNITY): Payer: Self-pay | Admitting: Emergency Medicine

## 2016-04-10 ENCOUNTER — Encounter (INDEPENDENT_AMBULATORY_CARE_PROVIDER_SITE_OTHER): Payer: Self-pay | Admitting: Internal Medicine

## 2016-04-10 VITALS — BP 122/90 | HR 75 | Temp 98.1°F | Resp 18 | Ht 78.0 in | Wt 227.3 lb

## 2016-04-10 DIAGNOSIS — R05 Cough: Secondary | ICD-10-CM | POA: Diagnosis not present

## 2016-04-10 DIAGNOSIS — I10 Essential (primary) hypertension: Secondary | ICD-10-CM | POA: Insufficient documentation

## 2016-04-10 DIAGNOSIS — R1084 Generalized abdominal pain: Secondary | ICD-10-CM | POA: Insufficient documentation

## 2016-04-10 DIAGNOSIS — R197 Diarrhea, unspecified: Secondary | ICD-10-CM

## 2016-04-10 DIAGNOSIS — R531 Weakness: Secondary | ICD-10-CM | POA: Insufficient documentation

## 2016-04-10 DIAGNOSIS — I2510 Atherosclerotic heart disease of native coronary artery without angina pectoris: Secondary | ICD-10-CM | POA: Diagnosis not present

## 2016-04-10 DIAGNOSIS — Z79899 Other long term (current) drug therapy: Secondary | ICD-10-CM | POA: Insufficient documentation

## 2016-04-10 DIAGNOSIS — R1031 Right lower quadrant pain: Secondary | ICD-10-CM | POA: Diagnosis not present

## 2016-04-10 DIAGNOSIS — R110 Nausea: Secondary | ICD-10-CM | POA: Insufficient documentation

## 2016-04-10 DIAGNOSIS — R42 Dizziness and giddiness: Secondary | ICD-10-CM | POA: Insufficient documentation

## 2016-04-10 DIAGNOSIS — Z87891 Personal history of nicotine dependence: Secondary | ICD-10-CM | POA: Diagnosis not present

## 2016-04-10 DIAGNOSIS — R1011 Right upper quadrant pain: Secondary | ICD-10-CM | POA: Diagnosis not present

## 2016-04-10 DIAGNOSIS — R109 Unspecified abdominal pain: Secondary | ICD-10-CM | POA: Diagnosis not present

## 2016-04-10 LAB — URINALYSIS, ROUTINE W REFLEX MICROSCOPIC
BACTERIA UA: NONE SEEN
Bilirubin Urine: NEGATIVE
Glucose, UA: >=500 mg/dL — AB
HGB URINE DIPSTICK: NEGATIVE
Ketones, ur: 20 mg/dL — AB
Leukocytes, UA: NEGATIVE
Nitrite: NEGATIVE
PROTEIN: 30 mg/dL — AB
SPECIFIC GRAVITY, URINE: 1.039 — AB (ref 1.005–1.030)
pH: 5 (ref 5.0–8.0)

## 2016-04-10 LAB — COMPREHENSIVE METABOLIC PANEL
ALBUMIN: 4.7 g/dL (ref 3.5–5.0)
ALK PHOS: 63 U/L (ref 38–126)
ALT: 20 U/L (ref 17–63)
ANION GAP: 13 (ref 5–15)
AST: 20 U/L (ref 15–41)
BUN: 18 mg/dL (ref 6–20)
CALCIUM: 10.2 mg/dL (ref 8.9–10.3)
CHLORIDE: 97 mmol/L — AB (ref 101–111)
CO2: 27 mmol/L (ref 22–32)
Creatinine, Ser: 0.9 mg/dL (ref 0.61–1.24)
GFR calc non Af Amer: >60 mL/min (ref >60)
GLUCOSE: 149 mg/dL — AB (ref 65–99)
POTASSIUM: 4 mmol/L (ref 3.5–5.1)
SODIUM: 137 mmol/L (ref 135–145)
Total Bilirubin: 1.9 mg/dL — ABNORMAL HIGH (ref 0.3–1.2)
Total Protein: 8.5 g/dL — ABNORMAL HIGH (ref 6.5–8.1)

## 2016-04-10 LAB — LIPASE, BLOOD: LIPASE: 10 U/L — AB (ref 11–51)

## 2016-04-10 LAB — CBC
HEMATOCRIT: 51.3 % (ref 39.0–52.0)
HEMOGLOBIN: 18.7 g/dL — AB (ref 13.0–17.0)
MCH: 32 pg (ref 26.0–34.0)
MCHC: 36.5 g/dL — AB (ref 30.0–36.0)
MCV: 87.7 fL (ref 78.0–100.0)
Platelets: 238 10*3/uL (ref 150–400)
RBC: 5.85 MIL/uL — AB (ref 4.22–5.81)
RDW: 12 % (ref 11.5–15.5)
WBC: 5.9 10*3/uL (ref 4.0–10.5)

## 2016-04-10 MED ORDER — LEVOFLOXACIN 250 MG PO TABS: 250.0000 mg | ORAL_TABLET | Freq: Every day | ORAL | 0 refills | Status: DC

## 2016-04-10 MED ORDER — ONDANSETRON HCL 4 MG/2ML IJ SOLN
4.0000 mg | Freq: Once | INTRAMUSCULAR | Status: AC
  Administered 2016-04-10: 4 mg via INTRAVENOUS
  Filled 2016-04-10: qty 2

## 2016-04-10 MED ORDER — SODIUM CHLORIDE 0.9 % IV BOLUS (SEPSIS)
1000.0000 mL | Freq: Once | INTRAVENOUS | Status: AC
  Administered 2016-04-10: 1000 mL via INTRAVENOUS

## 2016-04-10 MED ORDER — FENTANYL CITRATE (PF) 100 MCG/2ML IJ SOLN
100.0000 ug | INTRAMUSCULAR | Status: DC | PRN
  Administered 2016-04-10: 100 ug via INTRAVENOUS
  Filled 2016-04-10: qty 2

## 2016-04-10 MED ORDER — IOPAMIDOL (ISOVUE-300) INJECTION 61%
INTRAVENOUS | Status: AC
  Administered 2016-04-10: 30 mL
  Filled 2016-04-10: qty 30

## 2016-04-10 MED ORDER — IOPAMIDOL (ISOVUE-300) INJECTION 61%
100.0000 mL | Freq: Once | INTRAVENOUS | Status: AC | PRN
  Administered 2016-04-10: 100 mL via INTRAVENOUS

## 2016-04-10 NOTE — ED Triage Notes (Signed)
Patient from Dr. Rosina Lowenstein office. Pt sent here to rule out bowel obstruction. Patient states abdominal paint that started 5 days ago. Pt states history of bowel surgery for colon removal and J-pouch.

## 2016-04-10 NOTE — Progress Notes (Signed)
Presenting complaint;  Nausea diarrhea and abdominal pain.  Database and Subjective:  Patient is 56 year old Caucasian male with history of ulcerative colitis and underwent IPAA in 2003. He has had few episodes of pouchitis along the vague responding to antibiotic therapy. He was hospitalized in March last year for what was felt to be enteritis with ileus and he required NG decompression. He was last seen in October 2017. He was given prescription for diphenoxylate to control frequency of his bowel movements. He recently found out that he would be losing his job. His last day at comment) was last week Thursday. He has been quite upset. On Wednesday which was 04/04/2016 he did not feel well. His wife came states that he had colds. He did not have fever. On 04/05/2016 he started to have nausea and heaving and increase in frequency of his bowel movements. All his stools have been loose. He also has been experiencing abdominal pain. He had another job interview on 12/04/2016 and he was not able to keep his appointment because of his symptoms and he got even more upset according to his wife. Patient's wife called me over the weekend and patient was advised to start taking Xifaxan for presumed pouchitis. He was only able to take 1 dose was on Saturday and one on Sunday he remained with nausea and heaving limited intake of fluids and diarrhea. He also began to have intermittent severe abdominal pain on the right side. His wife gave him 4 mg of Dilaudid on Sunday night and 4 mg last night(old prescription). This morning he has not felt any better. He has poor appetite. He has taken very little food by mouth. He has had 3 to force small stools today. He has passed very little flatus if any. He has lost 8 pounds since his symptoms began. He feels dizzy when he stands up. He has not had fever or skin rash.   Current Medications: Outpatient Encounter Prescriptions as of 04/10/2016  Medication Sig  . acetaminophen  (TYLENOL) 325 MG tablet Take 650 mg by mouth every 6 (six) hours as needed for mild pain.  Marland Kitchen acidophilus (RISAQUAD) CAPS capsule Take 2 capsules by mouth daily.  . Cholecalciferol (VITAMIN D3) 5000 units CAPS Take 4 Units by mouth daily.   . Coenzyme Q10 (COQ10) 100 MG CAPS Take 100 mg by mouth daily. GRADUALLY INCREASE TO 300 MG DAILY (Patient taking differently: Take 100 mg by mouth daily. )  . colchicine 0.6 MG tablet Take 0.6 mg by mouth as needed.   . diclofenac sodium (VOLTAREN) 1 % GEL Apply 2 g topically as needed.   . diphenoxylate-atropine (LOMOTIL) 2.5-0.025 MG tablet Take 1 tablet by mouth 4 (four) times daily as needed for diarrhea or loose stools. Reported on 09/09/2015  . empagliflozin (JARDIANCE) 10 MG TABS tablet Take 10 mg by mouth daily.  Marland Kitchen ezetimibe (ZETIA) 10 MG tablet Take 1 tablet (10 mg total) by mouth daily.  . metoprolol tartrate (LOPRESSOR) 25 MG tablet Take 0.5 tablets (12.5 mg total) by mouth 2 (two) times daily.  . nitroGLYCERIN (NITROSTAT) 0.4 MG SL tablet Place 1 tablet (0.4 mg total) under the tongue every 5 (five) minutes x 3 doses as needed for chest pain.  Marland Kitchen omega-3 acid ethyl esters (LOVAZA) 1 g capsule Take 2 capsules (2 g total) by mouth 2 (two) times daily.  . Omeprazole Magnesium (PRILOSEC OTC PO) Take 20 mg by mouth daily as needed. Reported on 09/09/2015  . ondansetron (ZOFRAN) 4 MG tablet  Take 1 tablet (4 mg total) by mouth every 8 (eight) hours as needed for nausea or vomiting.  Marland Kitchen OVER THE COUNTER MEDICATION Patient takes 2 by mouth daily - Vita - Sprout Capsules - Multi Vitamin  . rifaximin (XIFAXAN) 550 MG TABS tablet Take 1 tablet (550 mg total) by mouth 2 (two) times daily. Reported on 05/17/2015  . rosuvastatin (CRESTOR) 10 MG tablet Take 1 tablet twice weekly on Monday and Friday  . ticagrelor (BRILINTA) 60 MG TABS tablet Take 1 tablet (60 mg total) by mouth 2 (two) times daily.  . Turmeric Curcumin 500 MG CAPS Take 500 mg by mouth daily.  Marland Kitchen ULORIC  40 MG tablet Take 40 mg by mouth daily.  . [DISCONTINUED] OVER THE COUNTER MEDICATION Go- [employer]. Angel Vitamins- Patient takes by mouth in the morning and 1 by mouth in the evening.   No facility-administered encounter medications on file as of 04/10/2016.      Objective: Blood pressure 122/90, pulse 75, temperature 98.1 F (36.7 C), temperature source Oral, resp. rate 18, height 6\' 6"  (1.981 m), weight 227 lb 4.8 oz (103.1 kg). Patient appears acutely ill. Conjunctiva is pink. Sclera is nonicteric Oropharyngeal mucosa is normal and not dry. No neck masses or thyromegaly noted. Cardiac exam with regular rhythm normal S1 and S2. No murmur or gallop noted. Lungs are clear to auscultation. Abdomen is symmetrical. Bowel sounds are present. On palpation it is soft with mild generalized tenderness which is more pronounced in right mid and right low quadrant. No guarding noted. No organomegaly or masses. No LE edema or clubbing noted.     Assessment:  #1. Patient presents with five-day history of known bloody diarrhea nausea heaving and abdominal pain. He has not experienced fever or vomiting. He has history of pouchitis but did not respond to xifaxan that he took over the last 3 days. He has history of enteritis for which she was hospitalized last year. Patient has been under a lot of stress because he has lost his job. Differential diagnoses include gastroenteritis partial small bowel obstruction secondary lesions or the symptoms could be manifestation of extreme stress. He has lost 8 pounds recently and may be mildly dehydrated. Be further evaluated in emergency room. Will also need IV fluids.   Plan:  Patient advised to go to emergency room for further evaluation. ER physician Dr. Eulis Foster was contacted and provided brief history.

## 2016-04-10 NOTE — ED Notes (Signed)
Pt reports he emptied a large amount of gas from bag.

## 2016-04-10 NOTE — ED Notes (Signed)
Lab states pt's blood is in their possession.

## 2016-04-10 NOTE — ED Provider Notes (Signed)
[facility] DEPT Provider Note   CSN: [id number] Arrival date & time: 04/10/16  1736   By signing my name below, I, Hilbert Odor, attest that this documentation has been prepared under the direction and in the presence of Daleen Bo, MD. Electronically Signed: Hilbert Odor, Scribe. 04/10/16. 9:39 PM. History   Chief Complaint Chief Complaint  Patient presents with  . Abdominal Pain     The history is provided by the patient and a relative. No language interpreter was used.  HPI Comments: ARLOW COACHMAN is a 56 y.o. male who presents to the Emergency Department complaining of  Abdominal pain for the past 5 days. Per family: The patient's pain worsens throughout the day. The patient states that he has been having sinus problems recently. He states that he began feeling sick around 5 days ago. He reports having dizziness, weakness, diarrhea, nausea, and a cough. He states that diarrhea is typical for him but it is currently more watery than normal. He denies any blood in stool. He has taken some Zofran at home with some relief. He also denies vomiting and fever. The patient states that he was admitted in February of 2017 for a bowel obstruction. He denies any recent abdominal surgeries. The patient has not been compliant with all of his medications recently due to his abdominal issue.  I talked to the patient's gastroenterologist, prior to his arrival.  He had seen the patient earlier today and sent him here, for further evaluation.  There are no other known modifying factors.  Past Medical History:  Diagnosis Date  . CAD S/P DES PCI-circumflex 07/22/2014   PCI mCx: PCI: 2.75 x 18 mm Xience Alpine DES to mid LCx at takeoff of OM1 (OM1 jailed 50% stenosis)  . Erythrocytosis 11/28/2012  . Essential hypertension   . Fatigue 11/28/2012  . Gout 2014  . Hyperlipidemia with target LDL less than 70   . Hypertriglyceridemia 11/16/2014  . Non-STEMI (non-ST elevated myocardial  infarction) ([place]) 07/22/2014   a) RCA: OK, LM: Okay ; LAD: Proximal to mid 40%; RI: 30%;LCx: Mid to distal 90%, distal 20%, OM1 50% --> DES PCI  . Ulcerative colitis     Patient Active Problem List   Diagnosis Date Noted  . Enterocolitis 04/20/2015  . Hypertriglyceridemia 11/16/2014  . Hyperlipidemia with target LDL less than 70   . Essential hypertension   . NSTEMI (non-ST elevated myocardial infarction) ([place]) 07/22/2014  . Ulcerative colitis ([place]) 07/22/2014  . CAD S/P DES PCI-circumflex 07/22/2014  . Pouchitis ([place]) 12/22/2012  . Ventral hernia 12/22/2012  . Hypogonadism male 12/22/2012  . Fatigue 11/28/2012  . Erythrocytosis 11/28/2012  . H/O resection of large bowel 11/28/2012    Past Surgical History:  Procedure Laterality Date  . CARDIAC CATHETERIZATION N/A 07/23/2014   Procedure: Left Heart Cath and Coronary Angiography;  Surgeon: Leonie Man, MD;  Location: [facility];  Service: Cardiovascular;  RCA: Mild disease; LM: Okay ; LAD: Proximal to mid 40%; RI: 30%;LCx: Mid to distal 90%, distal 20%, OM1 50%; Mid inferolateral HK, EF 55-65%   . CARDIAC CATHETERIZATION N/A 07/23/2014   Procedure: Coronary Stent Intervention;  Surgeon: Leonie Man, MD;  Location: [facility];  Service: Cardiovascular; PCI: 2.75 x 18 mm Xience Alpine DES to mid LCx at takeoff of OM1 (OM1 jailed 50% stenosis)  . COLECTOMY    . HERNIA REPAIR    . TRANSTHORACIC ECHOCARDIOGRAM  07/22/2014   EF 55-60%, normal wall motion, normal diastolic  function,  no valve lesions        Home Medications    Prior to Admission medications   Medication Sig Start Date End Date Taking? Authorizing Provider  acetaminophen (TYLENOL) 325 MG tablet Take 650 mg by mouth every 6 (six) hours as needed for mild pain.   Yes Historical Provider, MD  acidophilus (RISAQUAD) CAPS capsule Take 2 capsules by mouth daily.   Yes Historical Provider, MD  Cholecalciferol (HM VITAMIN D3) 4000 units CAPS Take 1  capsule by mouth daily.   Yes Historical Provider, MD  Coenzyme Q10 (COQ10) 100 MG CAPS Take 100 mg by mouth daily. GRADUALLY INCREASE TO 300 MG DAILY Patient taking differently: Take 100 mg by mouth daily.  11/17/14  Yes Leonie Man, MD  colchicine 0.6 MG tablet Take 0.6 mg by mouth daily as needed (for gout flare).  11/04/15  Yes Historical Provider, MD  diclofenac sodium (VOLTAREN) 1 % GEL Apply 2 g topically daily as needed (for pain).  09/14/15  Yes Historical Provider, MD  diphenoxylate-atropine (LOMOTIL) 2.5-0.025 MG tablet Take 1 tablet by mouth 4 (four) times daily as needed for diarrhea or loose stools. Reported on 09/09/2015 Patient taking differently: Take 2 tablets by mouth 2 (two) times daily. *may take additional if needed 11/22/15  Yes Rogene Houston, MD  empagliflozin (JARDIANCE) 10 MG TABS tablet Take 10 mg by mouth daily.   Yes Historical Provider, MD  ezetimibe (ZETIA) 10 MG tablet Take 1 tablet (10 mg total) by mouth daily. 01/20/15  Yes Leonie Man, MD  metoprolol tartrate (LOPRESSOR) 25 MG tablet Take 0.5 tablets (12.5 mg total) by mouth 2 (two) times daily. 07/29/15  Yes Leonie Man, MD  nitroGLYCERIN (NITROSTAT) 0.4 MG SL tablet Place 1 tablet (0.4 mg total) under the tongue every 5 (five) minutes x 3 doses as needed for chest pain. 11/03/15  Yes Leonie Man, MD  omega-3 acid ethyl esters (LOVAZA) 1 g capsule Take 2 capsules (2 g total) by mouth 2 (two) times daily. 03/13/16  Yes Leonie Man, MD  omeprazole (PRILOSEC OTC) 20 MG tablet Take 20 mg by mouth daily.   Yes Historical Provider, MD  ondansetron (ZOFRAN) 4 MG tablet Take 1 tablet (4 mg total) by mouth every 8 (eight) hours as needed for nausea or vomiting. 04/07/16  Yes Rogene Houston, MD  OVER THE COUNTER MEDICATION Take 2 tablets by mouth daily. Patient takes 2 by mouth daily - Vita - Sprout Capsules - Multi Vitamin    Yes Historical Provider, MD  rifaximin (XIFAXAN) 550 MG TABS tablet Take 1 tablet (550  mg total) by mouth 2 (two) times daily. Reported on 05/17/2015 05/17/15  Yes Rogene Houston, MD  rosuvastatin (CRESTOR) 10 MG tablet Take 1 tablet twice weekly on Monday and Friday Patient taking differently: Take 10 mg by mouth 2 (two) times a week. Take 1 tablet twice weekly on Monday and Friday 10/07/15  Yes Leonie Man, MD  ticagrelor (BRILINTA) 60 MG TABS tablet Take 1 tablet (60 mg total) by mouth 2 (two) times daily. 07/29/15  Yes Leonie Man, MD  Turmeric Curcumin 500 MG CAPS Take 500 mg by mouth daily.   Yes Historical Provider, MD  ULORIC 40 MG tablet Take 40 mg by mouth daily. 05/09/15  Yes Historical Provider, MD  levofloxacin (LEVAQUIN) 250 MG tablet Take 1 tablet (250 mg total) by mouth daily. 04/10/16   Daleen Bo, MD    Family History  Family History  Problem Relation Age of Onset  . Diabetes Mother   . Coronary artery disease Mother   . Cancer Father 78    lung  . Diabetes Brother     Social History Social History  Substance Use Topics  . Smoking status: Former Smoker    Types: Cigarettes    Quit date: 12/22/2001  . Smokeless tobacco: Never Used  . Alcohol use No     Allergies   Flagyl [metronidazole]; Ciprofloxacin; Doxycycline; and Morphine and related   Review of Systems Review of Systems  Constitutional: Negative for fever.  Respiratory: Positive for cough.   Gastrointestinal: Positive for abdominal pain, diarrhea and nausea. Negative for blood in stool and vomiting.  Neurological: Positive for dizziness and weakness.  All other systems reviewed and are negative.    Physical Exam Updated Vital Signs BP 128/88   Pulse 80   Temp 98.1 F (36.7 C) (Oral)   Resp 16   Ht 6\' 6"  (1.981 m)   Wt 227 lb (103 kg)   SpO2 94%   BMI 26.23 kg/m   Physical Exam  Constitutional: He is oriented to person, place, and time. He appears well-developed and well-nourished.  HENT:  Head: Normocephalic and atraumatic.  Right Ear: External ear normal.  Left  Ear: External ear normal.  Eyes: Conjunctivae and EOM are normal. Pupils are equal, round, and reactive to light.  Neck: Normal range of motion and phonation normal. Neck supple.  Cardiovascular: Normal rate, regular rhythm and normal heart sounds.   Pulmonary/Chest: Effort normal and breath sounds normal. He exhibits no bony tenderness.  Abdominal:  Hyperactive bowel sounds. Guarding in the RUQ and RLQ. Diffused tenderness greater on the right than left.   Musculoskeletal: Normal range of motion.  Neurological: He is alert and oriented to person, place, and time. No cranial nerve deficit or sensory deficit. He exhibits normal muscle tone. Coordination normal.  Skin: Skin is warm, dry and intact.  Psychiatric: He has a normal mood and affect. His behavior is normal. Judgment and thought content normal.  Nursing note and vitals reviewed.    ED Treatments / Results  DIAGNOSTIC STUDIES: Oxygen Saturation is 98% on RA, normal by my interpretation.    COORDINATION OF CARE: 9:17 PM Discussed treatment plan with pt at bedside and pt agreed to plan. I will check the patient's CT a/p and labs.  Labs (all labs ordered are listed, but only abnormal results are displayed) Labs Reviewed  LIPASE, BLOOD - Abnormal; Notable for the following:       Result Value   Lipase 10 (*)    All other components within normal limits  COMPREHENSIVE METABOLIC PANEL - Abnormal; Notable for the following:    Chloride 97 (*)    Glucose, Bld 149 (*)    Total Protein 8.5 (*)    Total Bilirubin 1.9 (*)    All other components within normal limits  CBC - Abnormal; Notable for the following:    RBC 5.85 (*)    Hemoglobin 18.7 (*)    MCHC 36.5 (*)    All other components within normal limits  URINALYSIS, ROUTINE W REFLEX MICROSCOPIC - Abnormal; Notable for the following:    Specific Gravity, Urine 1.039 (*)    Glucose, UA >=500 (*)    Ketones, ur 20 (*)    Protein, ur 30 (*)    All other components within  normal limits    EKG  EKG Interpretation None  Radiology Ct Abdomen Pelvis W Contrast  Result Date: 04/10/2016 CLINICAL DATA:  56 y/o M; 5 days of abdominal pain. History of ulcerative colitis and colectomy. EXAM: CT ABDOMEN AND PELVIS WITH CONTRAST TECHNIQUE: Multidetector CT imaging of the abdomen and pelvis was performed using the standard protocol following bolus administration of intravenous contrast. CONTRAST:  50mL ISOVUE-300 IOPAMIDOL (ISOVUE-300) INJECTION 61%, 131mL ISOVUE-300 IOPAMIDOL (ISOVUE-300) INJECTION 61% COMPARISON:  04/19/2015 CT of the abdomen and pelvis. FINDINGS: Lower chest: No acute abnormality.  Coronary artery calcification. Hepatobiliary: No focal liver abnormality is seen. Status post cholecystectomy. No biliary dilatation. Pancreas: Unremarkable. No pancreatic ductal dilatation or surrounding inflammatory changes. Spleen: Normal in size without focal abnormality. Adrenals/Urinary Tract: Adrenal glands are unremarkable. Kidneys are normal, without renal calculi, focal lesion, or hydronephrosis. Bladder is unremarkable. Retroaortic left renal vein. Stomach/Bowel: Status post colectomy. Patent pelvic small bowel anastomosis. No evidence for small bowel obstruction. Mild wall thickening of small bowel loops in the mid abdomen which are mildly dilated, smoothly tapering in caliber proximally and distally. Vascular/Lymphatic: Aortic atherosclerosis. No enlarged abdominal or pelvic lymph nodes. Reproductive: Prostatic calcifications. Other: Stable small right sided abdominal wall hernia traversing the rectus muscle level of umbilicus, likely prior colostomy site, containing fat. Musculoskeletal: No acute osseous abnormality. Mild spondylosis of the visible thoracic and lumbar spine and mild bilateral hip osteoarthrosis. Multiple levels of Schmorl's nodes are stable. IMPRESSION: 1. Mildly dilated loops of small bowel in the mid abdomen with mild wall thickening probably  representing enteritis. No evidence for obstruction. 2. Stable chronic findings as above. Electronically Signed   By: Kristine Garbe M.D.   On: 04/10/2016 22:55    Procedures Procedures (including critical care time)  Medications Ordered in ED Medications  fentaNYL (SUBLIMAZE) injection 100 mcg (100 mcg Intravenous Given 04/10/16 2137)  sodium chloride 0.9 % bolus 1,000 mL (0 mLs Intravenous Stopped 04/10/16 2333)  ondansetron (ZOFRAN) injection 4 mg (4 mg Intravenous Given 04/10/16 2137)  iopamidol (ISOVUE-300) 61 % injection (30 mLs  Contrast Given 04/10/16 2232)  iopamidol (ISOVUE-300) 61 % injection 100 mL (100 mLs Intravenous Contrast Given 04/10/16 2233)     Initial Impression / Assessment and Plan / ED Course  I have reviewed the triage vital signs and the nursing notes.  Pertinent labs & imaging results that were available during my care of the patient were reviewed by me and considered in my medical decision making (see chart for details).     Medications  fentaNYL (SUBLIMAZE) injection 100 mcg (100 mcg Intravenous Given 04/10/16 2137)  sodium chloride 0.9 % bolus 1,000 mL (0 mLs Intravenous Stopped 04/10/16 2333)  ondansetron (ZOFRAN) injection 4 mg (4 mg Intravenous Given 04/10/16 2137)  iopamidol (ISOVUE-300) 61 % injection (30 mLs  Contrast Given 04/10/16 2232)  iopamidol (ISOVUE-300) 61 % injection 100 mL (100 mLs Intravenous Contrast Given 04/10/16 2233)    Patient Vitals for the past 24 hrs:  BP Temp Temp src Pulse Resp SpO2 Height Weight  04/10/16 2215 128/88 - - 80 16 94 % - -  04/10/16 2130 131/91 - - 91 16 96 % - -  04/10/16 2053 150/99 - - 96 18 98 % - -  04/10/16 2031 138/96 - - 95 18 94 % - -  04/10/16 1802 135/97 - - - - - - -  04/10/16 1759 - - - - - - 6\' 6"  (1.981 m) 227 lb (103 kg)  04/10/16 1758 - 98.1 F (36.7 C) Oral 92 18 98 % - -  11:13 PM Reevaluation with update and discussion. After initial assessment and treatment, an updated evaluation  reveals patient states that he feels back to normal, but that he will likely have some more diarrhea.  He feels like the pain medicine and fluids helped him.  He was able to tolerate the oral contrast, without vomiting. Zalika Tieszen L   23: 20-case discussed with the patient's GI physician who recommends starting Levaquin, discontinue Xifaxan, and follow-up in the office as needed.  He will review the CT images, tomorrow  Final Clinical Impressions(s) / ED Diagnoses   Final diagnoses:  Generalized abdominal pain  Diarrhea, unspecified type   Current abdominal pain and diarrhea, with absent colon.  No evidence for bowel obstruction or pouchitis on CT imaging.  Labs are reassuring.  Patient improved after treatment with IV fluids and a single dose of fentanyl.  Nursing Notes Reviewed/ Care Coordinated Applicable Imaging Reviewed Interpretation of Laboratory Data incorporated into ED treatment  The patient appears reasonably screened and/or stabilized for discharge and I doubt any other medical condition or other [facility] requiring further screening, evaluation, or treatment in the ED at this time prior to discharge.  Plan: Home Medications-continue usual medications, stop side effects; Home Treatments-rest, gradually advance diet; return here if the recommended treatment, does not improve the symptoms; Recommended follow up-GI, as needed   New Prescriptions Discharge Medication List as of 04/10/2016 11:32 PM    START taking these medications   Details  levofloxacin (LEVAQUIN) 250 MG tablet Take 1 tablet (250 mg total) by mouth daily., Starting Tue 04/10/2016, Print       I personally performed the services described in this documentation, which was scribed in my presence. The recorded information has been reviewed and is accurate    Daleen Bo, MD 04/10/16 2354

## 2016-04-10 NOTE — Patient Instructions (Addendum)
Patient to go to emergency room for IV fluids and further evaluation 

## 2016-04-10 NOTE — Discharge Instructions (Signed)
Get plenty of rest and drink a lot of fluids.  Start the antibiotic prescription in the morning.  Call your GI doctor if having problems.

## 2016-04-24 ENCOUNTER — Telehealth (INDEPENDENT_AMBULATORY_CARE_PROVIDER_SITE_OTHER): Payer: Self-pay | Admitting: Internal Medicine

## 2016-04-24 NOTE — Telephone Encounter (Signed)
Patient's wife is requesting Levaquin be called in. Per Dr. Laural Golden may call in Levaquin 500 mg qd 1 week.

## 2016-04-24 NOTE — Telephone Encounter (Signed)
Calvin Cruz, patient's spouse called, stated that Calvin Cruz saw Dr. Laural Golden on 04/10/16 and was sent over to ER.  He was given fluids and given a script for Levaquin.  He was feeling great, but yesterday he started with pain in his side again and now they are back in the same boat again.  He is only drinking fluids and is not hungry.  She would like to know what Dr. Laural Golden recommends  [phone]

## 2016-04-24 NOTE — Telephone Encounter (Signed)
Rx was called to [facility] in [place].

## 2016-04-24 NOTE — Telephone Encounter (Signed)
Per Dr.Rehman the patient will need to go to the ED. Patient was called and made aware.

## 2016-04-30 ENCOUNTER — Ambulatory Visit: Payer: Self-pay | Admitting: Cardiology

## 2016-05-04 ENCOUNTER — Telehealth: Payer: Self-pay | Admitting: *Deleted

## 2016-05-04 ENCOUNTER — Other Ambulatory Visit: Payer: Self-pay

## 2016-05-04 DIAGNOSIS — Z79899 Other long term (current) drug therapy: Secondary | ICD-10-CM

## 2016-05-04 NOTE — Telephone Encounter (Signed)
Patient came in today for appointment with Almyra Deforest, PA.  The appointment wasfor Friday 05/11/16 @ 8:30 am.  I spoke with the patient and he states he was told the appointment was today.  I checked with Eulas Post (he has full schedule) and offered the patient 2:30pm and 3:45pm.  He could not make eithertime.  He states he is losing his job and his insurance but is hoping to start a new job next week (7am--7-pm)---he will be unable to take any time off for 3 mos. Patient wanted to be seen in 3 mos in the lipid clinic after labs.  I spoke with Eulas Post and he stated to make sure the patient was not or had not had any chest pain.  I spoke with the patient and his wife and he denied chest pain or shortness of breath.  I scheduled an appointment in June with Dr. Ellyn Hack, gave the patient the lab slip and my direct dial extension for any future issues.

## 2016-05-07 ENCOUNTER — Other Ambulatory Visit: Payer: Self-pay

## 2016-05-07 MED ORDER — ROSUVASTATIN CALCIUM 10 MG PO TABS: ORAL_TABLET | ORAL | 0 refills | Status: AC

## 2016-05-07 MED ORDER — EZETIMIBE 10 MG PO TABS: 10.0000 mg | ORAL_TABLET | Freq: Every day | ORAL | 0 refills | Status: DC

## 2016-05-07 MED ORDER — NITROGLYCERIN 0.4 MG SL SUBL: 0.4000 mg | SUBLINGUAL_TABLET | SUBLINGUAL | 0 refills | Status: DC | PRN

## 2016-05-07 MED ORDER — METOPROLOL TARTRATE 25 MG PO TABS: 12.5000 mg | ORAL_TABLET | Freq: Two times a day (BID) | ORAL | 0 refills | Status: DC

## 2016-05-11 ENCOUNTER — Ambulatory Visit: Payer: 59 | Admitting: Physician Assistant

## 2016-05-22 ENCOUNTER — Ambulatory Visit (INDEPENDENT_AMBULATORY_CARE_PROVIDER_SITE_OTHER): Payer: Self-pay | Admitting: Internal Medicine

## 2016-07-13 ENCOUNTER — Telehealth: Payer: Self-pay | Admitting: Internal Medicine

## 2016-07-13 NOTE — Telephone Encounter (Signed)
Of note, Cipro is cited as an allergy. This was reviewed with the patient's spouse. There was itching and nausea. This was sometime ago. Patient took a course of Levaquin in March of this year without any difficulties whatsoever.

## 2016-07-13 NOTE — Telephone Encounter (Signed)
Patient's wife called. Husband history of UC  - status post IPAA. Multiple episodes of pouchitis with rather vague presenting symptoms;    It is reported to me by spouse patient now has some vague URI symptoms, lower abdominal pain and loose stools recently. This is characteristic of his typical  pouchitis prodrome. They're asking for a course of Levaquin to head this episode before gets bad. He has not had fever, nausea or vomiting.  I recommended a seven-day course of Levaquin  -  500 milligrams daily.  I called in a prescription to [facility] here in town.  Patient is to follow-up with Dr. Laural Golden thefirst of next week.

## 2016-07-14 ENCOUNTER — Encounter (HOSPITAL_COMMUNITY): Payer: Self-pay | Admitting: Emergency Medicine

## 2016-07-14 ENCOUNTER — Emergency Department (HOSPITAL_COMMUNITY)
Admission: EM | Admit: 2016-07-14 | Discharge: 2016-07-14 | Disposition: A | Discharge status: Home / Self Care | Payer: 59 | Attending: Emergency Medicine | Admitting: Emergency Medicine

## 2016-07-14 DIAGNOSIS — R1031 Right lower quadrant pain: Secondary | ICD-10-CM | POA: Insufficient documentation

## 2016-07-14 DIAGNOSIS — I2510 Atherosclerotic heart disease of native coronary artery without angina pectoris: Secondary | ICD-10-CM | POA: Insufficient documentation

## 2016-07-14 DIAGNOSIS — Z79899 Other long term (current) drug therapy: Secondary | ICD-10-CM | POA: Insufficient documentation

## 2016-07-14 DIAGNOSIS — Z87891 Personal history of nicotine dependence: Secondary | ICD-10-CM | POA: Insufficient documentation

## 2016-07-14 DIAGNOSIS — I10 Essential (primary) hypertension: Secondary | ICD-10-CM | POA: Insufficient documentation

## 2016-07-14 DIAGNOSIS — R109 Unspecified abdominal pain: Secondary | ICD-10-CM

## 2016-07-14 LAB — CBC
HEMATOCRIT: 55.1 % — AB (ref 39.0–52.0)
Hemoglobin: 20.3 g/dL — ABNORMAL HIGH (ref 13.0–17.0)
MCH: 32.3 pg (ref 26.0–34.0)
MCHC: 36.8 g/dL — ABNORMAL HIGH (ref 30.0–36.0)
MCV: 87.6 fL (ref 78.0–100.0)
PLATELETS: 232 10*3/uL (ref 150–400)
RBC: 6.29 MIL/uL — AB (ref 4.22–5.81)
RDW: 12.4 % (ref 11.5–15.5)
WBC: 12.3 10*3/uL — AB (ref 4.0–10.5)

## 2016-07-14 LAB — URINALYSIS, ROUTINE W REFLEX MICROSCOPIC
BILIRUBIN URINE: NEGATIVE
Glucose, UA: >=500 mg/dL — AB
HGB URINE DIPSTICK: NEGATIVE
KETONES UR: 20 mg/dL — AB
LEUKOCYTES UA: NEGATIVE
Nitrite: NEGATIVE
PROTEIN: 100 mg/dL — AB
Specific Gravity, Urine: 1.032 — ABNORMAL HIGH (ref 1.005–1.030)
pH: 5 (ref 5.0–8.0)

## 2016-07-14 LAB — COMPREHENSIVE METABOLIC PANEL
ALT: 29 U/L (ref 17–63)
AST: 26 U/L (ref 15–41)
Albumin: 5 g/dL (ref 3.5–5.0)
Alkaline Phosphatase: 68 U/L (ref 38–126)
Anion gap: 15 (ref 5–15)
BUN: 19 mg/dL (ref 6–20)
CO2: 23 mmol/L (ref 22–32)
CREATININE: 1.07 mg/dL (ref 0.61–1.24)
Calcium: 10.7 mg/dL — ABNORMAL HIGH (ref 8.9–10.3)
Chloride: 97 mmol/L — ABNORMAL LOW (ref 101–111)
Glucose, Bld: 203 mg/dL — ABNORMAL HIGH (ref 65–99)
POTASSIUM: 4.1 mmol/L (ref 3.5–5.1)
SODIUM: 135 mmol/L (ref 135–145)
Total Bilirubin: 1.6 mg/dL — ABNORMAL HIGH (ref 0.3–1.2)
Total Protein: 9.1 g/dL — ABNORMAL HIGH (ref 6.5–8.1)

## 2016-07-14 LAB — LIPASE, BLOOD: Lipase: 15 U/L (ref 11–51)

## 2016-07-14 MED ORDER — ONDANSETRON HCL 4 MG/2ML IJ SOLN
4.0000 mg | Freq: Once | INTRAMUSCULAR | Status: AC
  Administered 2016-07-14: 4 mg via INTRAVENOUS
  Filled 2016-07-14: qty 2

## 2016-07-14 MED ORDER — ONDANSETRON HCL 4 MG/2ML IJ SOLN: 4.0000 mg | Freq: Once | INTRAMUSCULAR | Status: DC

## 2016-07-14 MED ORDER — ONDANSETRON 8 MG PO TBDP: 8.0000 mg | ORAL_TABLET | Freq: Three times a day (TID) | ORAL | 0 refills | Status: DC | PRN

## 2016-07-14 MED ORDER — FENTANYL CITRATE (PF) 100 MCG/2ML IJ SOLN
100.0000 ug | Freq: Once | INTRAMUSCULAR | Status: AC
  Administered 2016-07-14: 100 ug via INTRAVENOUS
  Filled 2016-07-14: qty 2

## 2016-07-14 MED ORDER — SODIUM CHLORIDE 0.9 % IV BOLUS (SEPSIS)
1000.0000 mL | Freq: Once | INTRAVENOUS | Status: AC
  Administered 2016-07-14: 1000 mL via INTRAVENOUS

## 2016-07-14 MED ORDER — TRAMADOL HCL 50 MG PO TABS: 50.0000 mg | ORAL_TABLET | Freq: Four times a day (QID) | ORAL | 0 refills | Status: DC | PRN

## 2016-07-14 NOTE — ED Triage Notes (Signed)
PT states he has had his colon removed and has periods of infection at times in his intestines. PT states RLQ pain with n/v/d that started yesterday evening and states that Dr. Sydell Axon called him Levaquin and he started that antibiotic last night.

## 2016-07-14 NOTE — Telephone Encounter (Signed)
See note

## 2016-07-14 NOTE — Discharge Instructions (Signed)
Medication for pain and nausea. Return to the emergency department if your symptoms worsen.

## 2016-07-14 NOTE — ED Notes (Signed)
Pt alert & oriented x4, stable gait. Patient given discharge instructions, paperwork & prescription(s). Patient  instructed to stop at the registration desk to finish any additional paperwork. Patient verbalized understanding. Pt left department w/ no further questions. 

## 2016-07-14 NOTE — ED Notes (Signed)
Pulse is 116

## 2016-07-14 NOTE — ED Notes (Signed)
Pt states abdominal pain starting to increase again. Pt says still unable to give a urine sample at this time.

## 2016-07-17 ENCOUNTER — Telehealth (INDEPENDENT_AMBULATORY_CARE_PROVIDER_SITE_OTHER): Payer: Self-pay | Admitting: Internal Medicine

## 2016-07-17 NOTE — Telephone Encounter (Signed)
Will arrange for office visit.

## 2016-07-17 NOTE — Telephone Encounter (Signed)
Patient's wife Calvin Cruz called and stated that the patient has been very sick.  Stated that he went to the emergency room on Saturday.  He's still sick.  She would like to update Dr. Laural Golden to see what he needs to do next or if he wants to prescribe something for him.   [phone]

## 2016-07-18 ENCOUNTER — Other Ambulatory Visit (INDEPENDENT_AMBULATORY_CARE_PROVIDER_SITE_OTHER): Payer: Self-pay | Admitting: *Deleted

## 2016-07-18 MED ORDER — LEVOFLOXACIN 500 MG PO TABS: 500.0000 mg | ORAL_TABLET | Freq: Every day | ORAL | 0 refills | Status: DC

## 2016-07-18 NOTE — Telephone Encounter (Signed)
Per Dr.Rehman- seven days on the Levaquin 500 mg is sufficient. If patient feels better after completing this treatment no appointment needed in the office/ If he feels no better after the treatment he will need appointment to be seen in the office with Dr.Rehman.  This was shared with the patient's wife,Kim. She ask if Dr.Rehman would write a RX for the Levaquin 500 mg to have on hand if it is needed. To be addressed with Dr.Rehman, patient's wife will be made aware.

## 2016-07-18 NOTE — Telephone Encounter (Signed)
Per Dr.Rehman may call in RX for Levaquin 500 mg - take 1 daily #10 doses. This is to have on hand if the patient should have a flare over a weekend. This was called to the patient's pharmacy. Wife made aware.

## 2016-07-19 NOTE — Telephone Encounter (Signed)
A staff message has been sent to Calvin Cruz to help me find an appointment for this patient.

## 2016-07-22 NOTE — ED Provider Notes (Signed)
[facility] DEPT Provider Note   CSN: [id number] Arrival date & time: 07/14/16  1501     History   Chief Complaint Chief Complaint  Patient presents with  . Abdominal Pain    HPI Calvin Cruz is a 56 y.o. male.  Patient presents with right lower quadrant pain for 1-2 days. He is status post ileoanal anastomosis 14 years ago with a creation of a J-pouch secondary to ulcerative colitis. He has had many bouts of pouchitis.  He is currently taking Flagyl. His has had prodromal URI symptoms. No fever, sweats, chills, vomiting, diarrhea, flank pain, dysuria.  He feels dehydrated from decreased oral intake.      Past Medical History:  Diagnosis Date  . CAD S/P DES PCI-circumflex 07/22/2014   PCI mCx: PCI: 2.75 x 18 mm Xience Alpine DES to mid LCx at takeoff of OM1 (OM1 jailed 50% stenosis)  . Erythrocytosis 11/28/2012  . Essential hypertension   . Fatigue 11/28/2012  . Gout 2014  . Hyperlipidemia with target LDL less than 70   . Hypertriglyceridemia 11/16/2014  . Non-STEMI (non-ST elevated myocardial infarction) ([place]) 07/22/2014   a) RCA: OK, LM: Okay ; LAD: Proximal to mid 40%; RI: 30%;LCx: Mid to distal 90%, distal 20%, OM1 50% --> DES PCI  . Ulcerative colitis     Patient Active Problem List   Diagnosis Date Noted  . Enterocolitis 04/20/2015  . Hypertriglyceridemia 11/16/2014  . Hyperlipidemia with target LDL less than 70   . Essential hypertension   . NSTEMI (non-ST elevated myocardial infarction) ([place]) 07/22/2014  . Ulcerative colitis ([place]) 07/22/2014  . CAD S/P DES PCI-circumflex 07/22/2014  . Pouchitis ([place]) 12/22/2012  . Ventral hernia 12/22/2012  . Hypogonadism male 12/22/2012  . Fatigue 11/28/2012  . Erythrocytosis 11/28/2012  . H/O resection of large bowel 11/28/2012    Past Surgical History:  Procedure Laterality Date  . CARDIAC CATHETERIZATION N/A 07/23/2014   Procedure: Left Heart Cath and Coronary Angiography;  Surgeon: Leonie Man, MD;   Location: [facility];  Service: Cardiovascular;  RCA: Mild disease; LM: Okay ; LAD: Proximal to mid 40%; RI: 30%;LCx: Mid to distal 90%, distal 20%, OM1 50%; Mid inferolateral HK, EF 55-65%   . CARDIAC CATHETERIZATION N/A 07/23/2014   Procedure: Coronary Stent Intervention;  Surgeon: Leonie Man, MD;  Location: [facility];  Service: Cardiovascular; PCI: 2.75 x 18 mm Xience Alpine DES to mid LCx at takeoff of OM1 (OM1 jailed 50% stenosis)  . COLECTOMY    . HERNIA REPAIR    . TRANSTHORACIC ECHOCARDIOGRAM  07/22/2014   EF 55-60%, normal wall motion, normal diastolic function,  no valve lesions        Home Medications    Prior to Admission medications   Medication Sig Start Date End Date Taking? Authorizing Provider  acetaminophen (TYLENOL) 325 MG tablet Take 650 mg by mouth every 6 (six) hours as needed for mild pain.   Yes [provider]  acidophilus (RISAQUAD) CAPS capsule Take 2 capsules by mouth daily.   Yes [provider]  Cholecalciferol (HM VITAMIN D3) 4000 units CAPS Take 1 capsule by mouth daily.   Yes [provider]  Coenzyme Q10 (COQ10) 100 MG CAPS Take 100 mg by mouth daily. GRADUALLY INCREASE TO 300 MG DAILY Patient taking differently: Take 100 mg by mouth daily.  11/17/14  Yes Leonie Man, MD  colchicine 0.6 MG tablet Take 0.6 mg by mouth daily as needed (for gout flare).  11/04/15  Yes [provider]  diclofenac sodium (VOLTAREN) 1 % GEL Apply 2 g topically daily as needed (for pain).  09/14/15  Yes [provider]  diphenoxylate-atropine (LOMOTIL) 2.5-0.025 MG tablet Take 1 tablet by mouth 4 (four) times daily as needed for diarrhea or loose stools. Reported on 09/09/2015 Patient taking differently: Take 2 tablets by mouth 2 (two) times daily. *may take additional if needed 11/22/15  Yes Rehman, Mechele Dawley, MD  empagliflozin (JARDIANCE) 10 MG TABS tablet Take 10 mg by mouth daily.   Yes [provider]    ezetimibe (ZETIA) 10 MG tablet Take 1 tablet (10 mg total) by mouth daily. 05/07/16  Yes Leonie Man, MD  levofloxacin (LEVAQUIN) 250 MG tablet Take 1 tablet (250 mg total) by mouth daily. Patient taking differently: Take 500 mg by mouth daily.  04/10/16  Yes Daleen Bo, MD  metoprolol tartrate (LOPRESSOR) 25 MG tablet Take 0.5 tablets (12.5 mg total) by mouth 2 (two) times daily. 05/07/16  Yes Leonie Man, MD  nitroGLYCERIN (NITROSTAT) 0.4 MG SL tablet Place 1 tablet (0.4 mg total) under the tongue every 5 (five) minutes x 3 doses as needed for chest pain. 05/07/16  Yes Leonie Man, MD  omega-3 acid ethyl esters (LOVAZA) 1 g capsule Take 2 capsules (2 g total) by mouth 2 (two) times daily. 03/13/16  Yes Leonie Man, MD  omeprazole (PRILOSEC OTC) 20 MG tablet Take 20 mg by mouth daily as needed (for acid reflux).    Yes [provider]  ondansetron (ZOFRAN) 4 MG tablet Take 1 tablet (4 mg total) by mouth every 8 (eight) hours as needed for nausea or vomiting. 04/07/16  Yes Rehman, Mechele Dawley, MD  OVER THE COUNTER MEDICATION Take 2 tablets by mouth daily. Patient takes 2 by mouth daily - Vita - Sprout Capsules - Multi Vitamin    Yes [provider]  rosuvastatin (CRESTOR) 10 MG tablet Take 1 tablet twice weekly on Monday and Friday Patient taking differently: Take 10 mg by mouth 2 (two) times a week. Take 1 tablet twice weekly on Monday and Friday 05/07/16  Yes Leonie Man, MD  ticagrelor (BRILINTA) 60 MG TABS tablet Take 1 tablet (60 mg total) by mouth 2 (two) times daily. 07/29/15  Yes Leonie Man, MD  ULORIC 40 MG tablet Take 40 mg by mouth daily. 05/09/15  Yes [provider]  levofloxacin (LEVAQUIN) 500 MG tablet Take 1 tablet (500 mg total) by mouth daily. 07/18/16   Rogene Houston, MD  ondansetron (ZOFRAN ODT) 8 MG disintegrating tablet Take 1 tablet (8 mg total) by mouth every 8 (eight) hours as needed for nausea or vomiting. 07/14/16   Nat Christen, MD  rifaximin (XIFAXAN) 550 MG TABS tablet Take 1 tablet (550 mg total) by mouth 2 (two) times daily. Reported on 05/17/2015 05/17/15   Rogene Houston, MD  traMADol (ULTRAM) 50 MG tablet Take 1 tablet (50 mg total) by mouth every 6 (six) hours as needed. 07/14/16   Nat Christen, MD    Family History Family History  Problem Relation Age of Onset  . Diabetes Mother   . Coronary artery disease Mother   . Cancer Father 57       lung  . Diabetes Brother     Social History Social History  Substance Use Topics  . Smoking status: Former Smoker    Types: Cigarettes    Quit date: 12/22/2001  . Smokeless tobacco:  Never Used  . Alcohol use No     Allergies   Flagyl [metronidazole]; Ciprofloxacin; Doxycycline; and Morphine and related   Review of Systems Review of Systems  All other systems reviewed and are negative.    Physical Exam Updated Vital Signs BP (!) 123/95   Pulse 88   Temp 98 F (36.7 C) (Oral)   Resp 18   Ht 6\' 6"  (1.981 m)   Wt 108 kg (238 lb)   SpO2 92%   BMI 27.50 kg/m   Physical Exam  Constitutional: He is oriented to person, place, and time. He appears well-developed and well-nourished.  HENT:  Head: Normocephalic and atraumatic.  Eyes: Conjunctivae are normal.  Neck: Neck supple.  Cardiovascular: Normal rate and regular rhythm.   Pulmonary/Chest: Effort normal and breath sounds normal.  Abdominal:  Tender right lower quadrant.  Musculoskeletal: Normal range of motion.  Neurological: He is alert and oriented to person, place, and time.  Skin: Skin is warm and dry.  Psychiatric: He has a normal mood and affect. His behavior is normal.  Nursing note and vitals reviewed.    ED Treatments / Results  Labs (all labs ordered are listed, but only abnormal results are displayed) Labs Reviewed  COMPREHENSIVE METABOLIC PANEL - Abnormal; Notable for the following:       Result Value   Chloride 97 (*)    Glucose, Bld 203 (*)    Calcium 10.7 (*)      Total Protein 9.1 (*)    Total Bilirubin 1.6 (*)    All other components within normal limits  CBC - Abnormal; Notable for the following:    WBC 12.3 (*)    RBC 6.29 (*)    Hemoglobin 20.3 (*)    HCT 55.1 (*)    MCHC 36.8 (*)    All other components within normal limits  URINALYSIS, ROUTINE W REFLEX MICROSCOPIC - Abnormal; Notable for the following:    APPearance HAZY (*)    Specific Gravity, Urine 1.032 (*)    Glucose, UA >=500 (*)    Ketones, ur 20 (*)    Protein, ur 100 (*)    Bacteria, UA RARE (*)    Squamous Epithelial / LPF 0-5 (*)    All other components within normal limits  LIPASE, BLOOD    EKG  EKG Interpretation Calvin Cruz       Radiology No results found.  Procedures Procedures (including critical care time)  Medications Ordered in ED Medications  fentaNYL (SUBLIMAZE) injection 100 mcg (100 mcg Intravenous Given 07/14/16 1626)  ondansetron (ZOFRAN) injection 4 mg (4 mg Intravenous Given 07/14/16 1626)  sodium chloride 0.9 % bolus 1,000 mL (0 mLs Intravenous Stopped 07/14/16 1641)  sodium chloride 0.9 % bolus 1,000 mL (0 mLs Intravenous Stopped 07/14/16 1809)  ondansetron (ZOFRAN) injection 4 mg (4 mg Intravenous Given 07/14/16 1848)  sodium chloride 0.9 % bolus 1,000 mL (0 mLs Intravenous Stopped 07/14/16 2109)  fentaNYL (SUBLIMAZE) injection 100 mcg (100 mcg Intravenous Given 07/14/16 1925)     Initial Impression / Assessment and Plan / ED Course  I have reviewed the triage vital signs and the nursing notes.  Pertinent labs & imaging results that were available during my care of the patient were reviewed by me and considered in my medical decision making (see chart for details).     This is a similar presentation to his pouchitis in the past. He is currently on Flagyl. He has declined a CT scan secondary to monetary  concerns. He feels much better after 3 L of fluid. He states he is ready to go home. He understands to return for increasing pain, fever, chills,  dysuria or any concerns.  Final Clinical Impressions(s) / ED Diagnoses   Final diagnoses:  Abdominal pain, unspecified abdominal location    New Prescriptions Discharge Medication List as of 07/14/2016  9:22 PM    START taking these medications   Details  ondansetron (ZOFRAN ODT) 8 MG disintegrating tablet Take 1 tablet (8 mg total) by mouth every 8 (eight) hours as needed for nausea or vomiting., Starting Sat 07/14/2016, Print    traMADol (ULTRAM) 50 MG tablet Take 1 tablet (50 mg total) by mouth every 6 (six) hours as needed., Starting Sat 07/14/2016, Print         Nat Christen, MD 07/22/16 [phone]

## 2016-07-26 ENCOUNTER — Ambulatory Visit (INDEPENDENT_AMBULATORY_CARE_PROVIDER_SITE_OTHER): Payer: Self-pay | Admitting: Internal Medicine

## 2016-07-26 ENCOUNTER — Encounter (INDEPENDENT_AMBULATORY_CARE_PROVIDER_SITE_OTHER): Payer: Self-pay | Admitting: Internal Medicine

## 2016-07-26 VITALS — BP 120/80 | HR 72 | Temp 97.7°F | Ht 78.0 in | Wt 229.7 lb

## 2016-07-26 DIAGNOSIS — K529 Noninfective gastroenteritis and colitis, unspecified: Secondary | ICD-10-CM

## 2016-07-26 NOTE — Progress Notes (Signed)
Subjective:    Patient ID: Calvin Cruz, male    DOB: Aug 12, 1960, 56 y.o.   MRN: [id number]  HPI Last seen by Dr Laural Golden in February. States he started Levaquin today. He has pain across his upper abdomen and his rt abdomen.  He has a constant ache in his rt abdomen. There has been no fever. Has taken Levaquin 500mg  x 1 today.  He averages about 3 stools a day which were watery. Stools were liquid today.  No BRRB.  Hx of UC and underwent IPAA in 2003.  Hx of episodes of pouchitis.   Seen in the ED 07/14/2016 for same and given fluids, pain medication.  No leg cramps. He is afraid to eat because of the pain.  Review of Systems Past Medical History:  Diagnosis Date  . CAD S/P DES PCI-circumflex 07/22/2014   PCI mCx: PCI: 2.75 x 18 mm Xience Alpine DES to mid LCx at takeoff of OM1 (OM1 jailed 50% stenosis)  . Erythrocytosis 11/28/2012  . Essential hypertension   . Fatigue 11/28/2012  . Gout 2014  . Hyperlipidemia with target LDL less than 70   . Hypertriglyceridemia 11/16/2014  . Non-STEMI (non-ST elevated myocardial infarction) ([place]) 07/22/2014   a) RCA: OK, LM: Okay ; LAD: Proximal to mid 40%; RI: 30%;LCx: Mid to distal 90%, distal 20%, OM1 50% --> DES PCI  . Ulcerative colitis     Past Surgical History:  Procedure Laterality Date  . CARDIAC CATHETERIZATION N/A 07/23/2014   Procedure: Left Heart Cath and Coronary Angiography;  Surgeon: Leonie Man, MD;  Location: [facility];  Service: Cardiovascular;  RCA: Mild disease; LM: Okay ; LAD: Proximal to mid 40%; RI: 30%;LCx: Mid to distal 90%, distal 20%, OM1 50%; Mid inferolateral HK, EF 55-65%   . CARDIAC CATHETERIZATION N/A 07/23/2014   Procedure: Coronary Stent Intervention;  Surgeon: Leonie Man, MD;  Location: [facility];  Service: Cardiovascular; PCI: 2.75 x 18 mm Xience Alpine DES to mid LCx at takeoff of OM1 (OM1 jailed 50% stenosis)  . COLECTOMY    . HERNIA REPAIR    . TRANSTHORACIC ECHOCARDIOGRAM  07/22/2014    EF 55-60%, normal wall motion, normal diastolic function,  no valve lesions     Allergies  Allergen Reactions  . Flagyl [Metronidazole] Itching  . Ciprofloxacin Itching and Nausea And Vomiting  . Doxycycline Itching and Other (See Comments)    Redness of skin, burning sensation  . Morphine And Related Itching    Current Outpatient Prescriptions on File Prior to Visit  Medication Sig Dispense Refill  . acetaminophen (TYLENOL) 325 MG tablet Take 650 mg by mouth every 6 (six) hours as needed for mild pain.    Marland Kitchen acidophilus (RISAQUAD) CAPS capsule Take 2 capsules by mouth daily.    . Cholecalciferol (HM VITAMIN D3) 4000 units CAPS Take 1 capsule by mouth daily.    . Coenzyme Q10 (COQ10) 100 MG CAPS Take 100 mg by mouth daily. GRADUALLY INCREASE TO 300 MG DAILY (Patient taking differently: Take 100 mg by mouth daily. ) 90 each 11  . colchicine 0.6 MG tablet Take 0.6 mg by mouth daily as needed (for gout flare).     . diclofenac sodium (VOLTAREN) 1 % GEL Apply 2 g topically daily as needed (for pain).     Marland Kitchen diphenoxylate-atropine (LOMOTIL) 2.5-0.025 MG tablet Take 1 tablet by mouth 4 (four) times daily as needed for diarrhea or loose stools. Reported on 09/09/2015 (Patient taking  differently: Take 2 tablets by mouth 2 (two) times daily. *may take additional if needed) 120 tablet 2  . empagliflozin (JARDIANCE) 10 MG TABS tablet Take 10 mg by mouth daily.    Marland Kitchen ezetimibe (ZETIA) 10 MG tablet Take 1 tablet (10 mg total) by mouth daily. 90 tablet 0  . metoprolol tartrate (LOPRESSOR) 25 MG tablet Take 0.5 tablets (12.5 mg total) by mouth 2 (two) times daily. 90 tablet 0  . nitroGLYCERIN (NITROSTAT) 0.4 MG SL tablet Place 1 tablet (0.4 mg total) under the tongue every 5 (five) minutes x 3 doses as needed for chest pain. 100 tablet 0  . omega-3 acid ethyl esters (LOVAZA) 1 g capsule Take 2 capsules (2 g total) by mouth 2 (two) times daily. 120 capsule 6  . omeprazole (PRILOSEC OTC) 20 MG tablet Take 20  mg by mouth daily as needed (for acid reflux).     . ondansetron (ZOFRAN ODT) 8 MG disintegrating tablet Take 1 tablet (8 mg total) by mouth every 8 (eight) hours as needed for nausea or vomiting. 15 tablet 0  . OVER THE COUNTER MEDICATION Take 2 tablets by mouth daily. Patient takes 2 by mouth daily - Vita - Sprout Capsules - Multi Vitamin     . rosuvastatin (CRESTOR) 10 MG tablet Take 1 tablet twice weekly on Monday and Friday (Patient taking differently: Take 10 mg by mouth 2 (two) times a week. Take 1 tablet twice weekly on Monday and Friday) 60 tablet 0  . ticagrelor (BRILINTA) 60 MG TABS tablet Take 1 tablet (60 mg total) by mouth 2 (two) times daily. 180 tablet 3  . traMADol (ULTRAM) 50 MG tablet Take 1 tablet (50 mg total) by mouth every 6 (six) hours as needed. 25 tablet 0  . ULORIC 40 MG tablet Take 40 mg by mouth daily.  1  . levofloxacin (LEVAQUIN) 500 MG tablet Take 1 tablet (500 mg total) by mouth daily. (Patient not taking: Reported on 07/26/2016) 10 tablet 0  . ondansetron (ZOFRAN) 4 MG tablet Take 1 tablet (4 mg total) by mouth every 8 (eight) hours as needed for nausea or vomiting. (Patient not taking: Reported on 07/26/2016) 30 tablet 0  . rifaximin (XIFAXAN) 550 MG TABS tablet Take 1 tablet (550 mg total) by mouth 2 (two) times daily. Reported on 05/17/2015 (Patient not taking: Reported on 07/26/2016) 28 tablet 1   No current facility-administered medications on file prior to visit.         Objective:   Physical Exam Blood pressure 120/80, pulse 72, temperature 97.7 F (36.5 C), height 6\' 6"  (1.981 m), weight 229 lb 11.2 oz (104.2 kg). Alert and oriented. Skin warm and dry. Oral mucosa is moist.   . Sclera anicteric, conjunctivae is pink. Thyroid not enlarged. No cervical lymphadenopathy. Lungs clear. Heart regular rate and rhythm.  Abdomen is soft. Bowel sounds are positive. No hepatomegaly. No abdominal masses felt. Tenderness rt lower abdomen.   No edema to lower extremities.     Dr. Laural Golden in with patient.        Assessment & Plan:    Rt sided abdominal pain. Enteritis.  Has started Levaquin x 1 today.  Will continue Levaquin x 10 days. If not better, will get a CT abdomen.

## 2016-07-26 NOTE — Patient Instructions (Signed)
Continue the Levaquin 500mg  x 10 days.

## 2016-07-31 ENCOUNTER — Other Ambulatory Visit: Payer: Self-pay | Admitting: Endocrinology

## 2016-07-31 DIAGNOSIS — N401 Enlarged prostate with lower urinary tract symptoms: Secondary | ICD-10-CM | POA: Diagnosis not present

## 2016-07-31 DIAGNOSIS — Z1389 Encounter for screening for other disorder: Secondary | ICD-10-CM | POA: Diagnosis not present

## 2016-07-31 DIAGNOSIS — E1151 Type 2 diabetes mellitus with diabetic peripheral angiopathy without gangrene: Secondary | ICD-10-CM | POA: Diagnosis not present

## 2016-07-31 DIAGNOSIS — N200 Calculus of kidney: Secondary | ICD-10-CM | POA: Diagnosis not present

## 2016-07-31 DIAGNOSIS — I2510 Atherosclerotic heart disease of native coronary artery without angina pectoris: Secondary | ICD-10-CM | POA: Diagnosis not present

## 2016-07-31 DIAGNOSIS — D751 Secondary polycythemia: Secondary | ICD-10-CM | POA: Diagnosis not present

## 2016-07-31 DIAGNOSIS — E291 Testicular hypofunction: Secondary | ICD-10-CM | POA: Diagnosis not present

## 2016-07-31 DIAGNOSIS — M109 Gout, unspecified: Secondary | ICD-10-CM | POA: Diagnosis not present

## 2016-07-31 DIAGNOSIS — K51919 Ulcerative colitis, unspecified with unspecified complications: Secondary | ICD-10-CM

## 2016-07-31 DIAGNOSIS — K5190 Ulcerative colitis, unspecified, without complications: Secondary | ICD-10-CM | POA: Diagnosis not present

## 2016-07-31 DIAGNOSIS — E784 Other hyperlipidemia: Secondary | ICD-10-CM | POA: Diagnosis not present

## 2016-07-31 DIAGNOSIS — E559 Vitamin D deficiency, unspecified: Secondary | ICD-10-CM | POA: Diagnosis not present

## 2016-08-02 ENCOUNTER — Other Ambulatory Visit: Payer: Self-pay

## 2016-08-06 DIAGNOSIS — E291 Testicular hypofunction: Secondary | ICD-10-CM | POA: Diagnosis not present

## 2016-08-07 ENCOUNTER — Other Ambulatory Visit: Payer: Self-pay | Admitting: Cardiology

## 2016-08-08 ENCOUNTER — Ambulatory Visit
Admission: RE | Admit: 2016-08-08 | Discharge: 2016-08-08 | Disposition: A | Discharge status: Home / Self Care | Payer: Commercial Managed Care - PPO | Source: Ambulatory Visit | Attending: Endocrinology | Admitting: Endocrinology

## 2016-08-08 DIAGNOSIS — K5190 Ulcerative colitis, unspecified, without complications: Secondary | ICD-10-CM | POA: Diagnosis not present

## 2016-08-08 DIAGNOSIS — K51919 Ulcerative colitis, unspecified with unspecified complications: Secondary | ICD-10-CM

## 2016-08-08 MED ORDER — IOPAMIDOL (ISOVUE-300) INJECTION 61%
125.0000 mL | Freq: Once | INTRAVENOUS | Status: AC | PRN
  Administered 2016-08-08: 125 mL via INTRAVENOUS

## 2016-08-08 NOTE — Telephone Encounter (Signed)
REFILL 

## 2016-08-13 ENCOUNTER — Encounter: Payer: Self-pay | Admitting: Cardiology

## 2016-08-13 ENCOUNTER — Ambulatory Visit (INDEPENDENT_AMBULATORY_CARE_PROVIDER_SITE_OTHER): Payer: Commercial Managed Care - PPO | Admitting: Cardiology

## 2016-08-13 ENCOUNTER — Other Ambulatory Visit: Payer: Self-pay | Admitting: Endocrinology

## 2016-08-13 DIAGNOSIS — E291 Testicular hypofunction: Secondary | ICD-10-CM

## 2016-08-13 DIAGNOSIS — I10 Essential (primary) hypertension: Secondary | ICD-10-CM | POA: Diagnosis not present

## 2016-08-13 DIAGNOSIS — I2510 Atherosclerotic heart disease of native coronary artery without angina pectoris: Secondary | ICD-10-CM | POA: Diagnosis not present

## 2016-08-13 DIAGNOSIS — E781 Pure hyperglyceridemia: Secondary | ICD-10-CM

## 2016-08-13 DIAGNOSIS — I214 Non-ST elevation (NSTEMI) myocardial infarction: Secondary | ICD-10-CM | POA: Diagnosis not present

## 2016-08-13 DIAGNOSIS — Z9861 Coronary angioplasty status: Secondary | ICD-10-CM

## 2016-08-13 DIAGNOSIS — E785 Hyperlipidemia, unspecified: Secondary | ICD-10-CM | POA: Diagnosis not present

## 2016-08-13 DIAGNOSIS — R5382 Chronic fatigue, unspecified: Secondary | ICD-10-CM

## 2016-08-13 NOTE — Progress Notes (Signed)
PCP: Reynold Bowen, MD  Clinic Note: Chief Complaint  Patient presents with  . Follow-up    pt states feeling dizzy sometimes   . Edema    some in feet and ankles    32 HPI: Calvin Cruz is a 56 y.o. male with a PMH below who presents today for annual f/u for CAD. NSTEMI 07/2014 - PCI to Cx Has UC with frequent GI issues (s/p ileoanal anastomosis 14 years ago with a creation of a J-pouch secondary to ulcerative colitis. He has had many bouts of pouchitis.).  Completed 3rd round of Levaquin. PCP Dx - Polycythemia & low Testosterone.  Calvin Cruz was last seen on 07/29/2015 - doing well.   Recent Hospitalizations: EF 07/14/16  Studies Personally Reviewed - (if available, images/films reviewed: From Epic Chart or Care Everywhere)  n/a  History mostly provided by his wife.  Interval History: Multiple non-cardiic issues -> GI & high Hgb, low T.   Has new job - 1/2 pay, & maybe less stress.  - Due to change in job & insurance - was unable to afford Lovaza & several other medications - now back on meds. Remains stable from Cardiac Standpoint.   No chest pain or shortness of breath with rest or exertion.  No PND, orthopnea with mild end of day edema that goes down by the morning.Marland Kitchen  No palpitations, lightheadedness,, weakness or syncope/near syncope.  No TIA/amaurosis fugax symptoms. No melena, hematochezia, hematuria, or epstaxis. No real easy bleeding. No claudication.  ROS: A comprehensive was performed. Review of Systems  Constitutional: Positive for malaise/fatigue (low Energy level - ? related to Low Testosterone).  HENT: Positive for congestion.   Eyes: Negative for blurred vision.  Respiratory: Positive for cough. Negative for shortness of breath.        Getting over a cold from last week  Cardiovascular: Negative.        Per HPI  Gastrointestinal: Positive for abdominal pain (related to UC- pouchitis etc). Negative for blood in stool (none recently), constipation,  heartburn and melena.  Genitourinary: Negative for hematuria.  Musculoskeletal: Negative for joint pain.  Neurological: Negative for dizziness (Chronicly dizzy - balance issues).  Endo/Heme/Allergies: Negative for environmental allergies.  Psychiatric/Behavioral: Negative for depression and memory loss. The patient is not nervous/anxious and does not have insomnia.   All other systems reviewed and are negative.  I have reviewed and (if needed) personally updated the patient's problem list, medications, allergies, past medical and surgical history, social and family history.   Past Medical History:  Diagnosis Date  . CAD S/P DES PCI-circumflex 07/22/2014   PCI mCx: PCI: 2.75 x 18 mm Xience Alpine DES to mid LCx at takeoff of OM1 (OM1 jailed 50% stenosis)  . Erythrocytosis 11/28/2012  . Essential hypertension   . Fatigue 11/28/2012  . Gout 2014  . Hyperlipidemia with target LDL less than 70   . Hypertriglyceridemia 11/16/2014  . Non-STEMI (non-ST elevated myocardial infarction) ([place]) 07/22/2014   a) RCA: OK, LM: Okay ; LAD: Proximal to mid 40%; RI: 30%;LCx: Mid to distal 90%, distal 20%, OM1 50% --> DES PCI  . Ulcerative colitis    s/p ilioanal anastomosis (colectomy) ; has had multiple episodes of pouchitis.    Past Surgical History:  Procedure Laterality Date  . CARDIAC CATHETERIZATION N/A 07/23/2014   Procedure: Left Heart Cath and Coronary Angiography;  Surgeon: Leonie Man, MD;  Location: [facility];  Service: Cardiovascular;  RCA: Mild disease; LM: Okay ;  LAD: Proximal to mid 40%; RI: 30%;LCx: Mid to distal 90%, distal 20%, OM1 50%; Mid inferolateral HK, EF 55-65%   . CARDIAC CATHETERIZATION N/A 07/23/2014   Procedure: Coronary Stent Intervention;  Surgeon: Leonie Man, MD;  Location: [facility];  Service: Cardiovascular; PCI: 2.75 x 18 mm Xience Alpine DES to mid LCx at takeoff of OM1 (OM1 jailed 50% stenosis)  . COLECTOMY    . HERNIA REPAIR    . TRANSTHORACIC  ECHOCARDIOGRAM  07/22/2014   EF 55-60%, normal wall motion, normal diastolic function,  no valve lesions      Current Meds  Medication Sig  . acetaminophen (TYLENOL) 325 MG tablet Take 650 mg by mouth every 6 (six) hours as needed for mild pain.  Marland Kitchen acidophilus (RISAQUAD) CAPS capsule Take 2 capsules by mouth daily.  . Cholecalciferol (HM VITAMIN D3) 4000 units CAPS Take 1 capsule by mouth daily.  . Coenzyme Q10 (COQ10) 100 MG CAPS Take 100 mg by mouth daily. GRADUALLY INCREASE TO 300 MG DAILY (Patient taking differently: Take 100 mg by mouth daily. )  . colchicine 0.6 MG tablet Take 0.6 mg by mouth daily as needed (for gout flare).   . diclofenac sodium (VOLTAREN) 1 % GEL Apply 2 g topically daily as needed (for pain).   Marland Kitchen diphenoxylate-atropine (LOMOTIL) 2.5-0.025 MG tablet Take 1 tablet by mouth 4 (four) times daily as needed for diarrhea or loose stools. Reported on 09/09/2015 (Patient taking differently: Take 2 tablets by mouth 2 (two) times daily. *may take additional if needed)  . empagliflozin (JARDIANCE) 10 MG TABS tablet Take 10 mg by mouth daily.  Marland Kitchen ezetimibe (ZETIA) 10 MG tablet Take 1 tablet (10 mg total) by mouth daily.  Marland Kitchen levofloxacin (LEVAQUIN) 500 MG tablet Take 1 tablet (500 mg total) by mouth daily.  . metoprolol tartrate (LOPRESSOR) 25 MG tablet Take 0.5 tablets (12.5 mg total) by mouth 2 (two) times daily.  . nitroGLYCERIN (NITROSTAT) 0.4 MG SL tablet Place 1 tablet (0.4 mg total) under the tongue every 5 (five) minutes x 3 doses as needed for chest pain.  Marland Kitchen omega-3 acid ethyl esters (LOVAZA) 1 g capsule TAKE TWO CAPSULES BY MOUTH TWICE DAILY  . omeprazole (PRILOSEC OTC) 20 MG tablet Take 20 mg by mouth daily as needed (for acid reflux).   . ondansetron (ZOFRAN ODT) 8 MG disintegrating tablet Take 1 tablet (8 mg total) by mouth every 8 (eight) hours as needed for nausea or vomiting.  . ondansetron (ZOFRAN) 4 MG tablet Take 1 tablet (4 mg total) by mouth every 8 (eight) hours  as needed for nausea or vomiting.  Marland Kitchen OVER THE COUNTER MEDICATION Take 2 tablets by mouth daily. Patient takes 2 by mouth daily - Vita - Sprout Capsules - Multi Vitamin   . rifaximin (XIFAXAN) 550 MG TABS tablet Take 1 tablet (550 mg total) by mouth 2 (two) times daily. Reported on 05/17/2015  . rosuvastatin (CRESTOR) 10 MG tablet Take 1 tablet twice weekly on Monday and Friday (Patient taking differently: Take 10 mg by mouth 2 (two) times a week. Take 1 tablet twice weekly on Monday and Friday)  . ticagrelor (BRILINTA) 60 MG TABS tablet Take 1 tablet (60 mg total) by mouth 2 (two) times daily.  . traMADol (ULTRAM) 50 MG tablet Take 1 tablet (50 mg total) by mouth every 6 (six) hours as needed.  Marland Kitchen ULORIC 40 MG tablet Take 40 mg by mouth daily.    Allergies  Allergen Reactions  .  Flagyl [Metronidazole] Itching  . Ciprofloxacin Itching and Nausea And Vomiting  . Doxycycline Itching and Other (See Comments)    Redness of skin, burning sensation  . Morphine And Related Itching    Social History   Social History  . Marital status: Married    Spouse name: N/A  . Number of children: N/A  . Years of education: N/A   Social History Main Topics  . Smoking status: Former Smoker    Types: Cigarettes    Quit date: 12/22/2001  . Smokeless tobacco: Never Used  . Alcohol use No  . Drug use: No  . Sexual activity: Not Asked   Other Topics Concern  . None   Social History Narrative  . None    family history includes Cancer (age of onset: 41) in his father; Coronary artery disease in his mother; Diabetes in his brother and mother.  Wt Readings from Last 3 Encounters:  08/13/16 233 lb 3.2 oz (105.8 kg)  07/26/16 229 lb 11.2 oz (104.2 kg)  07/14/16 238 lb (108 kg)    PHYSICAL EXAM BP 108/74   Pulse 60   Ht 6\' 6"  (1.981 m)   Wt 233 lb 3.2 oz (105.8 kg)   BMI 26.95 kg/m  General appearance: alert, cooperative, appears stated age, no distress. Healthy appearing. Well nourished &  well-groomed. HEENT: [place]/AT, EOMI, MMM, anicteric sclera Neck: no adenopathy, no carotid bruit and no JVD Lungs: clear to auscultation bilaterally, normal percussion bilaterally and non-labored Heart: regular rate and rhythm, S1 &S2 normal, no murmur, click, rub or gallop; non-displaced PMI. Abdomen: soft, non-tender; bowel sounds normal; no masses,  no organomegaly; no HJR Extremities: extremities normal, atraumatic, no cyanosis, or edema  Pulses: 2+ and symmetric;  Skin: mobility and turgor normal, no evidence of bleeding or bruising and no lesions noted  Neurologic: Mental status: Alert & oriented x 3, thought content appropriate; non-focal exam.  Pleasant mood & affect.   Adult ECG Report n/a  Other studies Reviewed: Additional studies/ records that were reviewed today include:  Recent Labs:   Lab Results  Component Value Date   CHOL 181 12/12/2015   HDL 30 (L) 12/12/2015   LDLCALC NOT CALC 12/12/2015   LDLDIRECT 93 12/12/2015   TRIG 419 (H) 12/12/2015   CHOLHDL 6.0 (H) 12/12/2015   Lab Results  Component Value Date   WBC 12.3 (H) 07/14/2016   HGB 20.3 (H) 07/14/2016   HCT 55.1 (H) 07/14/2016   MCV 87.6 07/14/2016   PLT 232 07/14/2016   -  Lab Results  Component Value Date   CREATININE 1.07 07/14/2016   BUN 19 07/14/2016   NA 135 07/14/2016   K 4.1 07/14/2016   CL 97 (L) 07/14/2016   CO2 23 07/14/2016    ASSESSMENT / PLAN: Problem List Items Addressed This Visit    CAD S/P DES PCI-circumflex (Chronic)    Minimal other disease besides the ~50% jailed OM.  1 yr out from PCI  On 60 mg Brilinta BID (if $$ issues arise with Brilinta - can convert to Plavix; but [employer] is supposedly coming out with new card for $5 copay price for Brilinta) On BB - at 1/2 tab BID, back on Crestor      Essential hypertension (Chronic)    Really not a true Dx - borderline low BP on low dose BB.       Fatigue    ? Related to Low Testosterone - reasonable to replace to  Low-Normal level.  Hyperlipidemia with target LDL less than 70 (Chronic)    Difficult to determine LDL with HighTG - back on Lovaza & Crestor + Zetia. Labs being followed by PCP.  Will need to follow his statin tolerance level.      Hypertriglyceridemia (Chronic)    Back on Lovaza - will need to see f/u labs to see trend.      NSTEMI (non-ST elevated myocardial infarction) ([facility]) (Chronic)    No further anginal CP.  Preserved EF with normal wall motion on Echo. On stable regimen.         Current medicines are reviewed at length with the patient today. (+/- concerns) n/a The following changes have been made: n/a  Patient Instructions  NO CHANGE WITH TREATMENT OR MEDICATIONS  Your physician wants you to follow-up in [place]. You will receive a reminder letter in the mail two months in advance. If you don't receive a letter, please call our office to schedule the follow-up appointment.    Studies Ordered:   No orders of the defined types were placed in this encounter.     Glenetta Hew, M.D., M.S. Interventional Cardiologist   Pager # [phone] Phone # [phone] [address] [place], [place] [address]

## 2016-08-13 NOTE — Patient Instructions (Addendum)
NO CHANGE WITH TREATMENT OR MEDICATIONS  Your physician wants you to follow-up in [place]. You will receive a reminder letter in the mail two months in advance. If you don't receive a letter, please call our office to schedule the follow-up appointment.

## 2016-08-14 ENCOUNTER — Encounter: Payer: Self-pay | Admitting: Cardiology

## 2016-08-14 NOTE — Assessment & Plan Note (Signed)
Really not a true Dx - borderline low BP on low dose BB.

## 2016-08-14 NOTE — Assessment & Plan Note (Signed)
No further anginal CP.  Preserved EF with normal wall motion on Echo. On stable regimen.

## 2016-08-14 NOTE — Assessment & Plan Note (Signed)
Back on Lovaza - will need to see f/u labs to see trend.

## 2016-08-14 NOTE — Assessment & Plan Note (Signed)
Difficult to determine LDL with HighTG - back on Lovaza & Crestor + Zetia. Labs being followed by PCP.  Will need to follow his statin tolerance level.

## 2016-08-14 NOTE — Assessment & Plan Note (Signed)
?   Related to Low Testosterone - reasonable to replace to Low-Normal level.

## 2016-08-14 NOTE — Assessment & Plan Note (Signed)
Minimal other disease besides the ~50% jailed OM.  1 yr out from PCI  On 60 mg Brilinta BID (if $$ issues arise with Brilinta - can convert to Plavix; but [employer] is supposedly coming out with new card for $5 copay price for Brilinta) On BB - at 1/2 tab BID, back on Crestor

## 2016-08-20 ENCOUNTER — Telehealth: Payer: Self-pay | Admitting: Hematology

## 2016-08-20 ENCOUNTER — Encounter: Payer: Self-pay | Admitting: Hematology

## 2016-08-20 NOTE — Telephone Encounter (Signed)
Appt scheduled for the pt to see Dr. Burr Medico on 7/23 at 11am. Pt's wife aware to arrive 30 minutes early. Demographics verified. Letter and directions mailed.

## 2016-08-27 ENCOUNTER — Other Ambulatory Visit: Payer: Self-pay | Admitting: Cardiology

## 2016-08-27 ENCOUNTER — Ambulatory Visit
Admission: RE | Admit: 2016-08-27 | Discharge: 2016-08-27 | Disposition: A | Discharge status: Home / Self Care | Payer: Commercial Managed Care - PPO | Source: Ambulatory Visit | Attending: Endocrinology | Admitting: Endocrinology

## 2016-08-27 DIAGNOSIS — E236 Other disorders of pituitary gland: Secondary | ICD-10-CM | POA: Diagnosis not present

## 2016-08-27 DIAGNOSIS — E291 Testicular hypofunction: Secondary | ICD-10-CM

## 2016-08-27 MED ORDER — EZETIMIBE 10 MG PO TABS: 10.0000 mg | ORAL_TABLET | Freq: Every day | ORAL | 3 refills | Status: AC

## 2016-08-27 MED ORDER — TICAGRELOR 60 MG PO TABS: 60.0000 mg | ORAL_TABLET | Freq: Two times a day (BID) | ORAL | 3 refills | Status: DC

## 2016-08-27 MED ORDER — GADOBENATE DIMEGLUMINE 529 MG/ML IV SOLN
10.0000 mL | Freq: Once | INTRAVENOUS | Status: AC | PRN
  Administered 2016-08-27: 10 mL via INTRAVENOUS

## 2016-08-27 NOTE — Telephone Encounter (Signed)
Rx(s) sent to pharmacy electronically.  

## 2016-08-27 NOTE — Telephone Encounter (Signed)
New message    *STAT* If patient is at the pharmacy, call can be transferred to refill team.   1. Which medications need to be refilled? (please list name of each medication and dose if known) ezetimibe (ZETIA) 10 MG tablet,   ticagrelor (BRILINTA) 60 MG TABS tablet     2. Which pharmacy/location (including street and city if local pharmacy) is medication to be sent to? [facility]  3. Do they need a 30 day or 90 day supply? 30 day supply

## 2016-09-09 DIAGNOSIS — D45 Polycythemia vera: Secondary | ICD-10-CM | POA: Insufficient documentation

## 2016-09-10 ENCOUNTER — Ambulatory Visit (HOSPITAL_BASED_OUTPATIENT_CLINIC_OR_DEPARTMENT_OTHER): Payer: Commercial Managed Care - PPO

## 2016-09-10 ENCOUNTER — Ambulatory Visit (HOSPITAL_BASED_OUTPATIENT_CLINIC_OR_DEPARTMENT_OTHER): Payer: Commercial Managed Care - PPO | Admitting: Hematology

## 2016-09-10 ENCOUNTER — Telehealth: Payer: Self-pay | Admitting: Hematology

## 2016-09-10 ENCOUNTER — Encounter: Payer: Self-pay | Admitting: Hematology

## 2016-09-10 DIAGNOSIS — D45 Polycythemia vera: Secondary | ICD-10-CM

## 2016-09-10 DIAGNOSIS — I2510 Atherosclerotic heart disease of native coronary artery without angina pectoris: Secondary | ICD-10-CM | POA: Diagnosis not present

## 2016-09-10 DIAGNOSIS — D751 Secondary polycythemia: Secondary | ICD-10-CM

## 2016-09-10 DIAGNOSIS — K5190 Ulcerative colitis, unspecified, without complications: Secondary | ICD-10-CM | POA: Diagnosis not present

## 2016-09-10 DIAGNOSIS — E291 Testicular hypofunction: Secondary | ICD-10-CM | POA: Diagnosis not present

## 2016-09-10 DIAGNOSIS — E119 Type 2 diabetes mellitus without complications: Secondary | ICD-10-CM

## 2016-09-10 DIAGNOSIS — D479 Neoplasm of uncertain behavior of lymphoid, hematopoietic and related tissue, unspecified: Secondary | ICD-10-CM | POA: Diagnosis not present

## 2016-09-10 LAB — CBC & DIFF AND RETIC
BASO%: 0.7 % (ref 0.0–2.0)
BASOS ABS: 0 10*3/uL (ref 0.0–0.1)
EOS%: 1.1 % (ref 0.0–7.0)
Eosinophils Absolute: 0.1 10*3/uL (ref 0.0–0.5)
HCT: 46.2 % (ref 38.4–49.9)
HGB: 16.7 g/dL (ref 13.0–17.1)
Immature Retic Fract: 9.8 % (ref 3.00–10.60)
LYMPH#: 2.6 10*3/uL (ref 0.9–3.3)
LYMPH%: 46.1 % (ref 14.0–49.0)
MCH: 32.3 pg (ref 27.2–33.4)
MCHC: 36.1 g/dL — ABNORMAL HIGH (ref 32.0–36.0)
MCV: 89.4 fL (ref 79.3–98.0)
MONO#: 0.4 10*3/uL (ref 0.1–0.9)
MONO%: 7.4 % (ref 0.0–14.0)
NEUT#: 2.6 10*3/uL (ref 1.5–6.5)
NEUT%: 44.7 % (ref 39.0–75.0)
PLATELETS: 183 10*3/uL (ref 140–400)
RBC: 5.17 10*6/uL (ref 4.20–5.82)
RDW: 12.8 % (ref 11.0–14.6)
RETIC %: 2.64 % — AB (ref 0.80–1.80)
RETIC CT ABS: 136.49 10*3/uL — AB (ref 34.80–93.90)
WBC: 5.7 10*3/uL (ref 4.0–10.3)

## 2016-09-10 LAB — COMPREHENSIVE METABOLIC PANEL
ALT: 34 U/L (ref 0–55)
ANION GAP: 9 meq/L (ref 3–11)
AST: 27 U/L (ref 5–34)
Albumin: 4.3 g/dL (ref 3.5–5.0)
Alkaline Phosphatase: 57 U/L (ref 40–150)
BILIRUBIN TOTAL: 0.97 mg/dL (ref 0.20–1.20)
BUN: 9.5 mg/dL (ref 7.0–26.0)
CHLORIDE: 108 meq/L (ref 98–109)
CO2: 22 meq/L (ref 22–29)
CREATININE: 0.8 mg/dL (ref 0.7–1.3)
Calcium: 9.8 mg/dL (ref 8.4–10.4)
EGFR: >90 mL/min/{1.73_m2} (ref >90)
GLUCOSE: 156 mg/dL — AB (ref 70–140)
Potassium: 4 mEq/L (ref 3.5–5.1)
Sodium: 139 mEq/L (ref 136–145)
TOTAL PROTEIN: 7.4 g/dL (ref 6.4–8.3)

## 2016-09-10 NOTE — Telephone Encounter (Signed)
Scheduled appt per 7/23 los - Gave patient AVS and calender per los. Lab and f/u in 3 months.

## 2016-09-10 NOTE — Progress Notes (Signed)
Calvin Cruz  [phone] Fax:([phone]) [place] consult Note   Patient Care Team: Reynold Bowen, MD as PCP - General (Endocrinology) 09/10/2016  CHIEF COMPLAINTS/PURPOSE OF CONSULTATION:  Polycythemia   HISTORY OF PRESENTING ILLNESS: 09/10/16 Calvin Cruz 56 y.o. male is here because of his Polycythemia. He presents to the clinic today with his wife.  He was referred by Dr. Forde Dandy.   He was found to have abnormal CBC from 2014, he was seen by my partner Dr. Alvy Bimler by then, and he felt his polycythemia was released to his testosterone supplement for his hypotestosteronemia. He did 1 phlebotomy, and had some retraction and passed out, recovered well. He was recommended to stop the testosterone which he did. He lost follow up afterwards, and has not had phlebotomy or blood donation since then.  He had ulcerative colitis, status post total colectomy with a J-porch by Dr. Morton Stall at [facility] 13 years ago. He has been doing well until the end of last year, he developed multiple episodes of enteritis, required ED visit, and a course of antibiotics, in 03/2015, 02/2016, 03/2016 and 06/2016.   He has been discharged from Dr. Raj Janus office after surgery, he follows with GI Dr. Laural Golden. His has not had a colonoscopy since surgery. He is allergic to flagyl.  Today he reports to having diarrhea. He has no fever. He has a possible benign tumor in the sella turcica. He will follow up with Dr.South. His wife says he wakes up at night because he has trouble sleeping and breathing.  His Hb was around 14-16 since 2014-2017, but it went to 18-20 when he was seen in ED for enterocolitis in 03/2016 and 06/2016.   Due to the frequent illness since early this year, and worsening polycythemia, he was referred to our clinic by his PCP and endocrinologist Dr. Forde Dandy for further evaluation.    MEDICAL HISTORY:  Past Medical History:  Diagnosis Date  . CAD S/P DES  PCI-circumflex 07/22/2014   PCI mCx: PCI: 2.75 x 18 mm Xience Alpine DES to mid LCx at takeoff of OM1 (OM1 jailed 50% stenosis)  . Erythrocytosis 11/28/2012  . Essential hypertension   . Fatigue 11/28/2012  . Gout 2014  . Hyperlipidemia with target LDL less than 70   . Hypertriglyceridemia 11/16/2014  . Non-STEMI (non-ST elevated myocardial infarction) ([place]) 07/22/2014   a) RCA: OK, LM: Okay ; LAD: Proximal to mid 40%; RI: 30%;LCx: Mid to distal 90%, distal 20%, OM1 50% --> DES PCI  . Ulcerative colitis    s/p ilioanal anastomosis (colectomy) ; has had multiple episodes of pouchitis.    SURGICAL HISTORY: Past Surgical History:  Procedure Laterality Date  . CARDIAC CATHETERIZATION N/A 07/23/2014   Procedure: Left Heart Cath and Coronary Angiography;  Surgeon: Leonie Man, MD;  Location: [place] [facility];  Service: Cardiovascular;  RCA: Mild disease; LM: Okay ; LAD: Proximal to mid 40%; RI: 30%;LCx: Mid to distal 90%, distal 20%, OM1 50%; Mid inferolateral HK, EF 55-65%   . CARDIAC CATHETERIZATION N/A 07/23/2014   Procedure: Coronary Stent Intervention;  Surgeon: Leonie Man, MD;  Location: [place] [facility];  Service: Cardiovascular; PCI: 2.75 x 18 mm Xience Alpine DES to mid LCx at takeoff of OM1 (OM1 jailed 50% stenosis)  . COLECTOMY    . HERNIA REPAIR    . TRANSTHORACIC ECHOCARDIOGRAM  07/22/2014   EF 55-60%, normal wall motion, normal diastolic function,  no valve lesions  SOCIAL HISTORY: Social History   Social History  . Marital status: Married    Spouse name: N/A  . Number of children: N/A  . Years of education: N/A   Occupational History  . Not on file.   Social History Main Topics  . Smoking status: Former Smoker    Packs/day: 1.00    Years: 25.00    Types: Cigarettes    Quit date: 12/22/2001  . Smokeless tobacco: Never Used  . Alcohol use No  . Drug use: No  . Sexual activity: Not on file   Other Topics Concern  . Not on file   Social History  Narrative  . No narrative on file    FAMILY HISTORY: Family History  Problem Relation Age of Onset  . Diabetes Mother   . Coronary artery disease Mother   . Cancer Father 2       lung  . Diabetes Brother     ALLERGIES:  is allergic to flagyl [metronidazole]; ciprofloxacin; doxycycline; and morphine and related.  MEDICATIONS:  Current Outpatient Prescriptions  Medication Sig Dispense Refill  . acetaminophen (TYLENOL) 325 MG tablet Take 650 mg by mouth every 6 (six) hours as needed for mild pain.    Marland Kitchen acidophilus (RISAQUAD) CAPS capsule Take 2 capsules by mouth daily.    . Cholecalciferol (HM VITAMIN D3) 4000 units CAPS Take 1 capsule by mouth daily.    . Coenzyme Q10 (COQ10) 100 MG CAPS Take 100 mg by mouth daily. GRADUALLY INCREASE TO 300 MG DAILY (Patient taking differently: Take 100 mg by mouth daily. ) 90 each 11  . colchicine 0.6 MG tablet Take 0.6 mg by mouth daily as needed (for gout flare).     . diclofenac sodium (VOLTAREN) 1 % GEL Apply 2 g topically daily as needed (for pain).     Marland Kitchen empagliflozin (JARDIANCE) 10 MG TABS tablet Take 10 mg by mouth daily.    Marland Kitchen ezetimibe (ZETIA) 10 MG tablet Take 1 tablet (10 mg total) by mouth daily. 90 tablet 3  . metoprolol tartrate (LOPRESSOR) 25 MG tablet Take 0.5 tablets (12.5 mg total) by mouth 2 (two) times daily. 90 tablet 0  . omega-3 acid ethyl esters (LOVAZA) 1 g capsule TAKE TWO CAPSULES BY MOUTH TWICE DAILY 360 capsule 0  . omeprazole (PRILOSEC OTC) 20 MG tablet Take 20 mg by mouth daily as needed (for acid reflux).     . ondansetron (ZOFRAN ODT) 8 MG disintegrating tablet Take 1 tablet (8 mg total) by mouth every 8 (eight) hours as needed for nausea or vomiting. 15 tablet 0  . OVER THE COUNTER MEDICATION Take 2 tablets by mouth daily. Patient takes 2 by mouth daily - Vita - Sprout Capsules - Multi Vitamin     . rifaximin (XIFAXAN) 550 MG TABS tablet Take 1 tablet (550 mg total) by mouth 2 (two) times daily. Reported on 05/17/2015  28 tablet 1  . rosuvastatin (CRESTOR) 10 MG tablet Take 1 tablet twice weekly on Monday and Friday (Patient taking differently: Take 10 mg by mouth 2 (two) times a week. Take 1 tablet twice weekly on Monday and Friday) 60 tablet 0  . ticagrelor (BRILINTA) 60 MG TABS tablet Take 1 tablet (60 mg total) by mouth 2 (two) times daily. 180 tablet 3  . traMADol (ULTRAM) 50 MG tablet Take 1 tablet (50 mg total) by mouth every 6 (six) hours as needed. 25 tablet 0  . Turmeric (CURCUMIN 95) 500 MG CAPS Take 1 capsule by  mouth daily.    Marland Kitchen ULORIC 40 MG tablet Take 40 mg by mouth daily.  1  . diphenoxylate-atropine (LOMOTIL) 2.5-0.025 MG tablet Take 1 tablet by mouth 4 (four) times daily as needed for diarrhea or loose stools. Reported on 09/09/2015 (Patient not taking: Reported on 09/10/2016) 120 tablet 2  . nitroGLYCERIN (NITROSTAT) 0.4 MG SL tablet Place 1 tablet (0.4 mg total) under the tongue every 5 (five) minutes x 3 doses as needed for chest pain. (Patient not taking: Reported on 09/10/2016) 100 tablet 0   No current facility-administered medications for this visit.     REVIEW OF SYSTEMS:   Constitutional: Denies fevers, chills or abnormal night sweats Eyes: Denies blurriness of vision, double vision or watery eyes Ears, nose, mouth, throat, and face: Denies mucositis or sore throat Respiratory: Denies cough, or wheezes (+) trouble breathing while sleeping Cardiovascular: Denies palpitation, chest discomfort or lower extremity swelling Gastrointestinal:  Denies nausea, heartburn or change in bowel habits Skin: Denies abnormal skin rashes Lymphatics: Denies new lymphadenopathy or easy bruising Neurological:Denies numbness, tingling or new weaknesses Behavioral/Psych: Mood is stable, no new changes  All other systems were reviewed with the patient and are negative.   PHYSICAL EXAMINATION: ECOG PERFORMANCE STATUS: 1 - Symptomatic but completely ambulatory  Vitals:   09/10/16 1205  BP: 135/78    Pulse: 72  Resp: 20  Temp: 97.6 F (36.4 C)   Filed Weights   09/10/16 1205  Weight: 236 lb (107 kg)    GENERAL:alert, no distress and comfortable SKIN: skin color, texture, turgor are normal, no rashes or significant lesions (+) skin flush on face  EYES: normal, conjunctiva are pink and non-injected, sclera clear OROPHARYNX:no exudate, no erythema and lips, buccal mucosa, and tongue normal  NECK: supple, thyroid normal size, non-tender, without nodularity LYMPH:  no palpable lymphadenopathy in the cervical, axillary or inguinal LUNGS: clear to auscultation and percussion with normal breathing effort HEART: regular rate & rhythm and no murmurs and no lower extremity edema ABDOMEN:abdomen soft, non-tender and normal bowel sounds (+) no splenomegaly or hepatomegalia.  Musculoskeletal:no cyanosis of digits and no clubbing  PSYCH: alert & oriented x 3 with fluent speech NEURO: no focal motor/sensory deficits  LABORATORY DATA:  I have reviewed the data as listed CBC Latest Ref Rng & Units 09/10/2016 07/14/2016 04/10/2016  WBC 4.0 - 10.3 10e3/uL 5.7 12.3(H) 5.9  Hemoglobin 13.0 - 17.1 g/dL 16.7 20.3(H) 18.7(H)  Hematocrit 38.4 - 49.9 % 46.2 55.1(H) 51.3  Platelets 140 - 400 10e3/uL 183 232 238    CMP Latest Ref Rng & Units 09/10/2016 07/14/2016 04/10/2016  Glucose 70 - 140 mg/dl 156(H) 203(H) 149(H)  BUN 7.0 - 26.0 mg/dL 9.'5 19 18  ' Creatinine 0.7 - 1.3 mg/dL 0.8 1.07 0.90  Sodium 136 - 145 mEq/L 139 135 137  Potassium 3.5 - 5.1 mEq/L 4.0 4.1 4.0  Chloride 101 - 111 mmol/L - 97(L) 97(L)  CO2 22 - 29 mEq/L '22 23 27  ' Calcium 8.4 - 10.4 mg/dL 9.8 10.7(H) 10.2  Total Protein 6.4 - 8.3 g/dL 7.4 9.1(H) 8.5(H)  Total Bilirubin 0.20 - 1.20 mg/dL 0.97 1.6(H) 1.9(H)  Alkaline Phos 40 - 150 U/L 57 68 63  AST 5 - 34 U/L '27 26 20  ' ALT 0 - 55 U/L 34 29 20     RADIOGRAPHIC STUDIES: I have personally reviewed the radiological images as listed and agreed with the findings in the report. Mr  Jeri Cos Wo Contrast  Result Date: 08/28/2016 CLINICAL  DATA:  Male hypogonadism EXAM: MRI HEAD WITHOUT AND WITH CONTRAST TECHNIQUE: Multiplanar, multiecho pulse sequences of the brain and surrounding structures were obtained without and with intravenous contrast. CONTRAST:  55m MULTIHANCE GADOBENATE DIMEGLUMINE 529 MG/ML IV SOLN COMPARISON:  None. FINDINGS: Brain: Dynamic pituitary protocol was performed as well as imaging of the entire brain. The sella is shallow. Pituitary approximately 3 mm in height. Infundibulum deviated to the left. Hypoenhancing tissue in the left sella measures 4 mm. This shows delayed enhancement and most likely is a microadenoma. No invasion of the cavernous sinus. Negative for compression of the optic chiasm. Ventricle size normal. Cerebral volume normal. Negative for acute or chronic infarction. Normal white matter and brainstem. Negative for hemorrhage. Remainder of the brain demonstrates normal enhancement. Vascular: Small distal right vertebral artery which is probably hypoplastic and ending in PICA. Remainder of the circle of Willis is patent. Skull and upper cervical spine: Negative Sinuses/Orbits: Negative Other: None IMPRESSION: Shallow sella turcica of with small pituitary tissue. 4 mm hypoenhancing tissue left sella consistent with microadenoma. No invasion of the cavernous sinus. Otherwise negative Electronically Signed   By: CFranchot GalloM.D.   On: 08/28/2016 07:35    ASSESSMENT & PLAN:  Calvin LIVESEYis a 56y.o. male who has a history of ulcerative colitis, status post total colectomy with J-porch, CAD, HLD and non-ST elevated myocardial infarction, here for evaluation of his erythrocytosis   1. erythrocytosis -found initially in 2014 when he was on testosterone supplement for hypotetosteronemia, but he has stopped the testosterone supplement since then. -Hemoglobin has been in the range of 14-20 in the past few years, the high number are more related to  dehydration from his enterocolitis  -This is likely secondary erythrocytosis. We discussed the possible etiologies, he used to smoke heavily, but has quit 15 years ago. I recommend him to get a sleep study to rule out sleep apnea. -Although I do not have high suspicion for polycythemia vera, I will get a JAK2 mutation to rule it out.  If comes back positive I will get him a bone marrow biopsy.  -I suggest that he drinks more water and stays hydrated.  -If his hemoglobin elevates again, we can get him phlebotomy. No need today.  -We will check his levels every 3-4 months to monitor.   2. Type 2 DM -Monitored by PCP  3. hypotestosteronemia   -Dx in 2013 and was given testosterone injections, but did not help.  -The blood was thicker so they stopped testosterone injections in 2014.   4. Ulcerative Colitis, 2003 -Had entire colon removed and has J-pouch done by Dr. WMorton Stall Cured -Now get episodes of enteritis. Takes Levaquin when he relapse in the hospital.  -I discussed that the inflammation can come from the J-pouch.  -Has not had colonoscopy since surgery -He is being monitored by GI.  -I do not think this is related to his erythrocytosis  5. CAD -1 stent placed -continue to follow up with cardiologist   6. Pituitary Tumor  -Found in 08/28/16 Brain MRI  -Will follow up with Dr. SForde Dandy   PLAN:  -Labs and f/u in 3-4 months -I will call him about the JAK2 mutation test result, if positive, will get a bone marrow biopsy.    No problem-specific Assessment & Plan notes found for this encounter.    All questions were answered. The patient knows to call the clinic with any problems, questions or concerns. I spent 40 minutes counseling the patient  face to face. The total time spent in the appointment was 45 minutes and more than 50% was on counseling.    This document serves as a record of services personally performed by Truitt Merle, MD. It was created on her behalf by Joslyn Devon, a  trained medical scribe. The creation of this record is based on the scribe's personal observations and the provider's statements to them. This document has been checked and approved by the attending provider.    Truitt Merle, MD 09/10/2016  8:35 PM

## 2016-09-11 LAB — SEDIMENTATION RATE: Sedimentation Rate-Westergren: 3 mm/hr (ref 0–30)

## 2016-09-11 LAB — ERYTHROPOIETIN: Erythropoietin: 7.2 m[IU]/mL (ref 2.6–18.5)

## 2016-09-19 DIAGNOSIS — E784 Other hyperlipidemia: Secondary | ICD-10-CM | POA: Diagnosis not present

## 2016-09-19 DIAGNOSIS — E1151 Type 2 diabetes mellitus with diabetic peripheral angiopathy without gangrene: Secondary | ICD-10-CM | POA: Diagnosis not present

## 2016-09-19 DIAGNOSIS — I2510 Atherosclerotic heart disease of native coronary artery without angina pectoris: Secondary | ICD-10-CM | POA: Diagnosis not present

## 2016-09-19 DIAGNOSIS — D352 Benign neoplasm of pituitary gland: Secondary | ICD-10-CM | POA: Diagnosis not present

## 2016-09-19 DIAGNOSIS — D751 Secondary polycythemia: Secondary | ICD-10-CM | POA: Diagnosis not present

## 2016-09-26 ENCOUNTER — Telehealth: Payer: Self-pay | Admitting: Hematology

## 2016-09-26 NOTE — Telephone Encounter (Signed)
I called patient and I left a message regarding his lab results, including negative JAK2 mutation results. I'll see him back in next month as is scheduled for follow-up.  Truitt Merle  09/26/2016

## 2016-09-29 ENCOUNTER — Telehealth: Payer: Self-pay

## 2016-09-29 NOTE — Telephone Encounter (Signed)
Spoke with patients wife and she is aware of his new appt due to bmdc  [place]

## 2016-10-23 ENCOUNTER — Ambulatory Visit (INDEPENDENT_AMBULATORY_CARE_PROVIDER_SITE_OTHER): Payer: Self-pay | Admitting: Internal Medicine

## 2016-12-10 NOTE — Progress Notes (Signed)
[place]  [phone] [phone]  Clinic Follow Up Note   Patient Care Team: Reynold Bowen, MD as PCP - General (Endocrinology) 12/12/2016  CHIEF COMPLAINTS/PURPOSE OF CONSULTATION:  Secondary polycythemia    HISTORY OF PRESENTING ILLNESS: 09/10/16 Calvin Cruz 56 y.o. male is here because of his Polycythemia. He presents to the clinic today with his wife.  He was referred by Dr. Forde Dandy.   He was found to have abnormal CBC from 2014, he was seen by my partner Dr. Alvy Bimler by then, and he felt his polycythemia was released to his testosterone supplement for his hypotestosteronemia. He did 1 phlebotomy, and had some retraction and passed out, recovered well. He was recommended to stop the testosterone which he did. He lost follow up afterwards, and has not had phlebotomy or blood donation since then.  He had ulcerative colitis, status post total colectomy with a J-porch by Dr. Morton Stall at [facility] 13 years ago. He has been doing well until the end of last year, he developed multiple episodes of enteritis, required ED visit, and a course of antibiotics, in 03/2015, 02/2016, 03/2016 and 06/2016.   He has been discharged from Dr. Raj Janus office after surgery, he follows with GI Dr. Laural Golden. His has not had a colonoscopy since surgery. He is allergic to flagyl.  Today he reports to having diarrhea. He has no fever. He has a possible benign tumor in the sella turcica. He will follow up with Dr.South. His wife says he wakes up at night because he has trouble sleeping and breathing.  His Hb was around 14-16 since 2014-2017, but it went to 18-20 when he was seen in ED for enterocolitis in 03/2016 and 06/2016.   Due to the frequent illness since early this year, and worsening polycythemia, he was referred to our clinic by his PCP and endocrinologist Dr. Forde Dandy for further evaluation.   CURRENT THERAPY: observation  INTERVAL HISTORY:  Calvin Cruz  is here for a follow up. He presents to the clinic today accompanied by his wife.  He notes to be well overall. He notes not taking NSAIDs. His BM are usually diarrhea due to colitis post surgery. He drinks a lot in a day. He prefers to follow Dr. Forde Dandy to monitor his levels.      MEDICAL HISTORY:  Past Medical History:  Diagnosis Date  . CAD S/P DES PCI-circumflex 07/22/2014   PCI mCx: PCI: 2.75 x 18 mm Xience Alpine DES to mid LCx at takeoff of OM1 (OM1 jailed 50% stenosis)  . Erythrocytosis 11/28/2012  . Essential hypertension   . Fatigue 11/28/2012  . Gout 2014  . Hyperlipidemia with target LDL less than 70   . Hypertriglyceridemia 11/16/2014  . Non-STEMI (non-ST elevated myocardial infarction) ([place]) 07/22/2014   a) RCA: OK, LM: Okay ; LAD: Proximal to mid 40%; RI: 30%;LCx: Mid to distal 90%, distal 20%, OM1 50% --> DES PCI  . Ulcerative colitis    s/p ilioanal anastomosis (colectomy) ; has had multiple episodes of pouchitis.    SURGICAL HISTORY: Past Surgical History:  Procedure Laterality Date  . CARDIAC CATHETERIZATION N/A 07/23/2014   Procedure: Left Heart Cath and Coronary Angiography;  Surgeon: Leonie Man, MD;  Location: [place] [facility];  Service: Cardiovascular;  RCA: Mild disease; LM: Okay ; LAD: Proximal to mid 40%; RI: 30%;LCx: Mid to distal 90%, distal 20%, OM1 50%; Mid inferolateral HK, EF 55-65%   . CARDIAC CATHETERIZATION N/A 07/23/2014  Procedure: Coronary Stent Intervention;  Surgeon: Leonie Man, MD;  Location: [place] [facility];  Service: Cardiovascular; PCI: 2.75 x 18 mm Xience Alpine DES to mid LCx at takeoff of OM1 (OM1 jailed 50% stenosis)  . COLECTOMY    . HERNIA REPAIR    . TRANSTHORACIC ECHOCARDIOGRAM  07/22/2014   EF 55-60%, normal wall motion, normal diastolic function,  no valve lesions     SOCIAL HISTORY: Social History   Social History  . Marital status: Married    Spouse name: N/A  . Number of children: N/A  . Years of education:  N/A   Occupational History  . Not on file.   Social History Main Topics  . Smoking status: Former Smoker    Packs/day: 1.00    Years: 25.00    Types: Cigarettes    Quit date: 12/22/2001  . Smokeless tobacco: Never Used  . Alcohol use No  . Drug use: No  . Sexual activity: Not on file   Other Topics Concern  . Not on file   Social History Narrative  . No narrative on file    FAMILY HISTORY: Family History  Problem Relation Age of Onset  . Diabetes Mother   . Coronary artery disease Mother   . Cancer Father 66       lung  . Diabetes Brother     ALLERGIES:  is allergic to flagyl [metronidazole]; ciprofloxacin; doxycycline; and morphine and related.  MEDICATIONS:  Current Outpatient Prescriptions  Medication Sig Dispense Refill  . acetaminophen (TYLENOL) 325 MG tablet Take 650 mg by mouth every 6 (six) hours as needed for mild pain.    Marland Kitchen acidophilus (RISAQUAD) CAPS capsule Take 2 capsules by mouth daily.    . Cholecalciferol (HM VITAMIN D3) 4000 units CAPS Take 1 capsule by mouth daily.    . Coenzyme Q10 (COQ10) 100 MG CAPS Take 100 mg by mouth daily. GRADUALLY INCREASE TO 300 MG DAILY (Patient taking differently: Take 100 mg by mouth daily. ) 90 each 11  . diclofenac sodium (VOLTAREN) 1 % GEL Apply 2 g topically daily as needed (for pain).     Marland Kitchen diphenoxylate-atropine (LOMOTIL) 2.5-0.025 MG tablet Take 1 tablet by mouth 4 (four) times daily as needed for diarrhea or loose stools. Reported on 09/09/2015 120 tablet 2  . empagliflozin (JARDIANCE) 10 MG TABS tablet Take 10 mg by mouth daily.    Marland Kitchen ezetimibe (ZETIA) 10 MG tablet Take 1 tablet (10 mg total) by mouth daily. 90 tablet 3  . metoprolol tartrate (LOPRESSOR) 25 MG tablet Take 0.5 tablets (12.5 mg total) by mouth 2 (two) times daily. 90 tablet 0  . nitroGLYCERIN (NITROSTAT) 0.4 MG SL tablet Place 1 tablet (0.4 mg total) under the tongue every 5 (five) minutes x 3 doses as needed for chest pain. 100 tablet 0  . omega-3  acid ethyl esters (LOVAZA) 1 g capsule TAKE TWO CAPSULES BY MOUTH TWICE DAILY 360 capsule 0  . omeprazole (PRILOSEC OTC) 20 MG tablet Take 20 mg by mouth daily as needed (for acid reflux).     . ondansetron (ZOFRAN ODT) 8 MG disintegrating tablet Take 1 tablet (8 mg total) by mouth every 8 (eight) hours as needed for nausea or vomiting. 15 tablet 0  . OVER THE COUNTER MEDICATION Take 2 tablets by mouth daily. Patient takes 2 by mouth daily - Vita - Sprout Capsules - Multi Vitamin     . rifaximin (XIFAXAN) 550 MG TABS tablet Take 1 tablet (550  mg total) by mouth 2 (two) times daily. Reported on 05/17/2015 28 tablet 1  . rosuvastatin (CRESTOR) 10 MG tablet Take 1 tablet twice weekly on Monday and Friday (Patient taking differently: Take 10 mg by mouth 2 (two) times a week. Take 1 tablet twice weekly on Monday and Friday) 60 tablet 0  . ticagrelor (BRILINTA) 60 MG TABS tablet Take 1 tablet (60 mg total) by mouth 2 (two) times daily. 180 tablet 3  . traMADol (ULTRAM) 50 MG tablet Take 1 tablet (50 mg total) by mouth every 6 (six) hours as needed. 25 tablet 0  . Turmeric (CURCUMIN 95) 500 MG CAPS Take 1 capsule by mouth daily.    Marland Kitchen ULORIC 40 MG tablet Take 40 mg by mouth daily.  1  . colchicine 0.6 MG tablet Take 0.6 mg by mouth daily as needed (for gout flare).      No current facility-administered medications for this visit.     REVIEW OF SYSTEMS:   Constitutional: Denies fevers, chills or abnormal night sweats Eyes: Denies blurriness of vision, double vision or watery eyes Ears, nose, mouth, throat, and face: Denies mucositis or sore throat Respiratory: Denies cough, or wheezes (+) trouble breathing while sleeping Cardiovascular: Denies palpitation, chest discomfort or lower extremity swelling Gastrointestinal:  Denies nausea, heartburn or change in bowel habits Skin: Denies abnormal skin rashes Lymphatics: Denies new lymphadenopathy or easy bruising Neurological:Denies numbness, tingling or new  weaknesses Behavioral/Psych: Mood is stable, no new changes  All other systems were reviewed with the patient and are negative.   PHYSICAL EXAMINATION: ECOG PERFORMANCE STATUS: 1 - Symptomatic but completely ambulatory  Vitals:   12/12/16 1411  BP: 129/78  Pulse: 90  Resp: 18  Temp: 98 F (36.7 C)  SpO2: 99%   Filed Weights   12/12/16 1411  Weight: 241 lb 1.6 oz (109.4 kg)    GENERAL:alert, no distress and comfortable SKIN: skin color, texture, turgor are normal, no rashes or significant lesions (+) skin flush on face  EYES: normal, conjunctiva are pink and non-injected, sclera clear OROPHARYNX:no exudate, no erythema and lips, buccal mucosa, and tongue normal  NECK: supple, thyroid normal size, non-tender, without nodularity LYMPH:  no palpable lymphadenopathy in the cervical, axillary or inguinal LUNGS: clear to auscultation and percussion with normal breathing effort HEART: regular rate & rhythm and no murmurs and no lower extremity edema ABDOMEN:abdomen soft, non-tender and normal bowel sounds (+) no splenomegaly or hepatomegalia.  Musculoskeletal:no cyanosis of digits and no clubbing  PSYCH: alert & oriented x 3 with fluent speech NEURO: no focal motor/sensory deficits  LABORATORY DATA:  I have reviewed the data as listed CBC Latest Ref Rng & Units 12/12/2016 09/10/2016 07/14/2016  WBC 4.0 - 10.3 10e3/uL 6.5 5.7 12.3(H)  Hemoglobin 13.0 - 17.1 g/dL 16.8 16.7 20.3(H)  Hematocrit 38.4 - 49.9 % 46.7 46.2 55.1(H)  Platelets 140 - 400 10e3/uL 179 183 232    CMP Latest Ref Rng & Units 09/10/2016 07/14/2016 04/10/2016  Glucose 70 - 140 mg/dl 156(H) 203(H) 149(H)  BUN 7.0 - 26.0 mg/dL 9.5 19 18   Creatinine 0.7 - 1.3 mg/dL 0.8 1.07 0.90  Sodium 136 - 145 mEq/L 139 135 137  Potassium 3.5 - 5.1 mEq/L 4.0 4.1 4.0  Chloride 101 - 111 mmol/L - 97(L) 97(L)  CO2 22 - 29 mEq/L 22 23 27   Calcium 8.4 - 10.4 mg/dL 9.8 10.7(H) 10.2  Total Protein 6.4 - 8.3 g/dL 7.4 9.1(H) 8.5(H)    Total Bilirubin 0.20 -  1.20 mg/dL 0.97 1.6(H) 1.9(H)  Alkaline Phos 40 - 150 U/L 57 68 63  AST 5 - 34 U/L 27 26 20   ALT 0 - 55 U/L 34 29 20    JAK2 V617F and EXON 12 REGION MUTATION ANALYSIS: NEGATIVE FOR MUTATION 09/11/16  Erythropoietin  Order: [id number]  Status:  Final result Visible to patient:  No (Not Released) Next appt:  01/22/2017 at 02:15 PM in Gastroenterology Hildred Laser, MD) Dx:  Polycythemia vera ([place])    Ref Range & Units 30mo ago 71yr ago   Erythropoietin 2.6 - 18.5 mIU/mL 7.2           RADIOGRAPHIC STUDIES: I have personally reviewed the radiological images as listed and agreed with the findings in the report. No results found.  ASSESSMENT & PLAN:  RANE BLITCH is a 56 y.o. male who has a history of ulcerative colitis, status post total colectomy with J-porch, CAD, HLD and non-ST elevated myocardial infarction, here for evaluation of his erythrocytosis  1. Secondary Polycythemia  -found initially in 2014 when he was on testosterone supplement for hypotetosteronemia, but he has stopped the testosterone supplement since then. -Hemoglobin has been in the range of 14-20 in the past few years, the high number are more related to dehydration from his enterocolitis  -His JAK2 mutation results were negative. Erythropoietin level is normal -This is likely secondary erythrocytosis. We discussed the possible etiologies, he used to smoke heavily, but has quit 15 years ago. I recommend him to get a sleep study to rule out sleep apnea. -we discussed that the risk of thrombosis from secondary polycythemia is small  -I previously suggested that he drinks more water and stays hydrated.  -If his hemoglobin persistently elevates (HCT>50), we can get him phlebotomy.  -We will check his levels every 3-4 months to monitor.  -He will follow up with Dr. Forde Dandy to monitor counts every 3-6 months. He will continue high fluid intake. If conditions worsen he can return to our clinic.  Will see Dr. Forde Dandy again 11/26.  -F/u with me as needed.    2. Type 2 DM -Monitored by PCP  3. hypotestosteronemia   -Dx in 2013 and was given testosterone injections, but did not help.  -The blood was thicker so they stopped testosterone injections in 2014.  -He notes his testosterone dropped again lately   4. Ulcerative Colitis, 2003 -Had entire colon removed and has J-pouch done by Dr. Morton Stall. Cured -Now get episodes of enteritis. Takes Levaquin when he relapse in the hospital.  -I discussed that the inflammation can come from the J-pouch.  -Has not had colonoscopy since surgery -He is being monitored by GI.  -I do not think this is related to his erythrocytosis  5. CAD -1 stent placed -continue to follow up with cardiologist   6. Pituitary Tumor  -Found in 08/28/16 Brain MRI  -Will follow up with Dr. Forde Dandy.   PLAN:  -Send note to Dr. Forde Dandy to monitor labs CBC every 3-6 months -F/u with me as needed    No problem-specific Assessment & Plan notes found for this encounter.    All questions were answered. The patient knows to call the clinic with any problems, questions or concerns. I spent 15 minutes counseling the patient face to face. The total time spent in the appointment was 20 minutes and more than 50% was on counseling.    This document serves as a record of services personally performed by Truitt Merle, MD. It was created  on her behalf by Joslyn Devon, a trained medical scribe. The creation of this record is based on the scribe's personal observations and the provider's statements to them. This document has been checked and approved by the attending provider.    Truitt Merle, MD 12/12/2016

## 2016-12-12 ENCOUNTER — Other Ambulatory Visit (HOSPITAL_BASED_OUTPATIENT_CLINIC_OR_DEPARTMENT_OTHER): Payer: Commercial Managed Care - PPO

## 2016-12-12 ENCOUNTER — Other Ambulatory Visit: Payer: Commercial Managed Care - PPO

## 2016-12-12 ENCOUNTER — Ambulatory Visit (HOSPITAL_BASED_OUTPATIENT_CLINIC_OR_DEPARTMENT_OTHER): Payer: Commercial Managed Care - PPO | Admitting: Hematology

## 2016-12-12 ENCOUNTER — Ambulatory Visit: Payer: Commercial Managed Care - PPO | Admitting: Hematology

## 2016-12-12 VITALS — BP 129/78 | HR 90 | Temp 98.0°F | Resp 18 | Ht 78.0 in | Wt 241.1 lb

## 2016-12-12 DIAGNOSIS — K5190 Ulcerative colitis, unspecified, without complications: Secondary | ICD-10-CM

## 2016-12-12 DIAGNOSIS — E119 Type 2 diabetes mellitus without complications: Secondary | ICD-10-CM

## 2016-12-12 DIAGNOSIS — D751 Secondary polycythemia: Secondary | ICD-10-CM

## 2016-12-12 DIAGNOSIS — E291 Testicular hypofunction: Secondary | ICD-10-CM

## 2016-12-12 DIAGNOSIS — D45 Polycythemia vera: Secondary | ICD-10-CM

## 2016-12-12 DIAGNOSIS — I2510 Atherosclerotic heart disease of native coronary artery without angina pectoris: Secondary | ICD-10-CM

## 2016-12-12 DIAGNOSIS — D479 Neoplasm of uncertain behavior of lymphoid, hematopoietic and related tissue, unspecified: Secondary | ICD-10-CM | POA: Diagnosis not present

## 2016-12-12 LAB — CBC & DIFF AND RETIC
BASO%: 0.5 % (ref 0.0–2.0)
BASOS ABS: 0 10*3/uL (ref 0.0–0.1)
EOS%: 1.1 % (ref 0.0–7.0)
Eosinophils Absolute: 0.1 10*3/uL (ref 0.0–0.5)
HCT: 46.7 % (ref 38.4–49.9)
HGB: 16.8 g/dL (ref 13.0–17.1)
Immature Retic Fract: 8.9 % (ref 3.00–10.60)
LYMPH#: 2.7 10*3/uL (ref 0.9–3.3)
LYMPH%: 41.4 % (ref 14.0–49.0)
MCH: 32.1 pg (ref 27.2–33.4)
MCHC: 36 g/dL (ref 32.0–36.0)
MCV: 89.3 fL (ref 79.3–98.0)
MONO#: 0.6 10*3/uL (ref 0.1–0.9)
MONO%: 8.7 % (ref 0.0–14.0)
NEUT%: 48.3 % (ref 39.0–75.0)
NEUTROS ABS: 3.2 10*3/uL (ref 1.5–6.5)
Platelets: 179 10*3/uL (ref 140–400)
RBC: 5.23 10*6/uL (ref 4.20–5.82)
RDW: 12.3 % (ref 11.0–14.6)
RETIC %: 1.82 % — AB (ref 0.80–1.80)
RETIC CT ABS: 95.19 10*3/uL — AB (ref 34.80–93.90)
WBC: 6.5 10*3/uL (ref 4.0–10.3)

## 2016-12-14 ENCOUNTER — Encounter: Payer: Self-pay | Admitting: Hematology

## 2016-12-14 ENCOUNTER — Telehealth: Payer: Self-pay | Admitting: Hematology

## 2016-12-14 NOTE — Telephone Encounter (Signed)
Per 10/24 los - F/u as needed

## 2017-01-22 ENCOUNTER — Ambulatory Visit (INDEPENDENT_AMBULATORY_CARE_PROVIDER_SITE_OTHER): Payer: Commercial Managed Care - PPO | Admitting: Internal Medicine

## 2017-02-25 DIAGNOSIS — M9906 Segmental and somatic dysfunction of lower extremity: Secondary | ICD-10-CM | POA: Diagnosis not present

## 2017-02-25 DIAGNOSIS — M25561 Pain in right knee: Secondary | ICD-10-CM | POA: Diagnosis not present

## 2017-03-01 DIAGNOSIS — Z1389 Encounter for screening for other disorder: Secondary | ICD-10-CM | POA: Diagnosis not present

## 2017-03-01 DIAGNOSIS — D352 Benign neoplasm of pituitary gland: Secondary | ICD-10-CM | POA: Diagnosis not present

## 2017-03-01 DIAGNOSIS — I2510 Atherosclerotic heart disease of native coronary artery without angina pectoris: Secondary | ICD-10-CM | POA: Diagnosis not present

## 2017-03-01 DIAGNOSIS — E1151 Type 2 diabetes mellitus with diabetic peripheral angiopathy without gangrene: Secondary | ICD-10-CM | POA: Diagnosis not present

## 2017-04-26 DIAGNOSIS — E11319 Type 2 diabetes mellitus with unspecified diabetic retinopathy without macular edema: Secondary | ICD-10-CM | POA: Diagnosis not present

## 2017-04-26 DIAGNOSIS — H524 Presbyopia: Secondary | ICD-10-CM | POA: Diagnosis not present

## 2017-05-01 DIAGNOSIS — J069 Acute upper respiratory infection, unspecified: Secondary | ICD-10-CM | POA: Diagnosis not present

## 2017-05-01 DIAGNOSIS — R509 Fever, unspecified: Secondary | ICD-10-CM | POA: Diagnosis not present

## 2017-05-06 DIAGNOSIS — R509 Fever, unspecified: Secondary | ICD-10-CM | POA: Diagnosis not present

## 2017-05-06 DIAGNOSIS — J069 Acute upper respiratory infection, unspecified: Secondary | ICD-10-CM | POA: Diagnosis not present

## 2017-05-06 DIAGNOSIS — Z6828 Body mass index (BMI) 28.0-28.9, adult: Secondary | ICD-10-CM | POA: Diagnosis not present

## 2017-05-30 ENCOUNTER — Ambulatory Visit (INDEPENDENT_AMBULATORY_CARE_PROVIDER_SITE_OTHER): Payer: Commercial Managed Care - PPO | Admitting: Internal Medicine

## 2017-05-30 ENCOUNTER — Encounter (INDEPENDENT_AMBULATORY_CARE_PROVIDER_SITE_OTHER): Payer: Self-pay | Admitting: Internal Medicine

## 2017-05-30 VITALS — BP 150/80 | HR 60 | Temp 98.2°F | Ht 78.0 in | Wt 233.9 lb

## 2017-05-30 DIAGNOSIS — K91850 Pouchitis: Secondary | ICD-10-CM

## 2017-05-30 MED ORDER — LEVOFLOXACIN 500 MG PO TABS: 500.0000 mg | ORAL_TABLET | Freq: Every day | ORAL | 1 refills | Status: DC

## 2017-05-30 MED ORDER — RIFAXIMIN 550 MG PO TABS: 550.0000 mg | ORAL_TABLET | Freq: Two times a day (BID) | ORAL | 1 refills | Status: DC

## 2017-05-30 NOTE — Patient Instructions (Signed)
Rx for Xifaxan and Levaquin sent to his pharmacy.

## 2017-05-30 NOTE — Progress Notes (Signed)
Subjective:    Patient ID: Calvin Cruz, male    DOB: 10-08-1960, 57 y.o.   MRN: [id number]  HPI Here today for f/u. Last seen in June of 2018.  Hx of UC and underwent IPAA in 2003. Hx of episodic pouchitis.  Seen in the ED in May of 2018 for pouchitis.  He was covered with Levaquin x 10 days by Dr. Laural Golden.  For the past few years, he will have a sinus infection and then move down to his gut per wife. In February and March he had sinusitis. He was covered with Amoxicillin.  April 1st, he started having more diarrhea than usual. He felt terrible. By Friday morning he was extremely sick. Wife started him Xifaxan he felt better. Started eating jello and apple sauce. He has slowly improved. He says the pain was in his upper abdomen and spread across his abdomen.  He says he is 95% better.  Wife is stating she would like a Rx for Levaquin and Xifaxan. He has having 4-5 stools a day. Stool are always loose. His appetite is good. Has gained about 4 pounds since his last visit.     05/03/2001 Severe pancolitis. Changes typical of UC. Terminal ileum normal. No evidence of recurrent polyp.    Works at [employer].   Review of Systems Past Medical History:  Diagnosis Date  . CAD S/P DES PCI-circumflex 07/22/2014   PCI mCx: PCI: 2.75 x 18 mm Xience Alpine DES to mid LCx at takeoff of OM1 (OM1 jailed 50% stenosis)  . Erythrocytosis 11/28/2012  . Essential hypertension   . Fatigue 11/28/2012  . Gout 2014  . Hyperlipidemia with target LDL less than 70   . Hypertriglyceridemia 11/16/2014  . Non-STEMI (non-ST elevated myocardial infarction) ([place]) 07/22/2014   a) RCA: OK, LM: Okay ; LAD: Proximal to mid 40%; RI: 30%;LCx: Mid to distal 90%, distal 20%, OM1 50% --> DES PCI  . Ulcerative colitis    s/p ilioanal anastomosis (colectomy) ; has had multiple episodes of pouchitis.    Past Surgical History:  Procedure Laterality Date  . CARDIAC CATHETERIZATION N/A 07/23/2014   Procedure: Left Heart Cath and  Coronary Angiography;  Surgeon: Leonie Man, MD;  Location: [place] [facility];  Service: Cardiovascular;  RCA: Mild disease; LM: Okay ; LAD: Proximal to mid 40%; RI: 30%;LCx: Mid to distal 90%, distal 20%, OM1 50%; Mid inferolateral HK, EF 55-65%   . CARDIAC CATHETERIZATION N/A 07/23/2014   Procedure: Coronary Stent Intervention;  Surgeon: Leonie Man, MD;  Location: [place] [facility];  Service: Cardiovascular; PCI: 2.75 x 18 mm Xience Alpine DES to mid LCx at takeoff of OM1 (OM1 jailed 50% stenosis)  . COLECTOMY    . HERNIA REPAIR    . TRANSTHORACIC ECHOCARDIOGRAM  07/22/2014   EF 55-60%, normal wall motion, normal diastolic function,  no valve lesions     Allergies  Allergen Reactions  . Flagyl [Metronidazole] Itching  . Ciprofloxacin Itching and Nausea And Vomiting  . Doxycycline Itching and Other (See Comments)    Redness of skin, burning sensation  . Morphine And Related Itching    Current Outpatient Medications on File Prior to Visit  Medication Sig Dispense Refill  . acetaminophen (TYLENOL) 325 MG tablet Take 650 mg by mouth every 6 (six) hours as needed for mild pain.    Marland Kitchen acidophilus (RISAQUAD) CAPS capsule Take 2 capsules by mouth daily.    . Cholecalciferol (HM VITAMIN D3) 4000 units CAPS Take  1 capsule by mouth daily.    . Coenzyme Q10 (COQ10) 100 MG CAPS Take 100 mg by mouth daily. GRADUALLY INCREASE TO 300 MG DAILY (Patient taking differently: Take 100 mg by mouth daily. ) 90 each 11  . colchicine 0.6 MG tablet Take 0.6 mg by mouth daily as needed (for gout flare).     . diclofenac sodium (VOLTAREN) 1 % GEL Apply 2 g topically daily as needed (for pain).     Marland Kitchen diphenoxylate-atropine (LOMOTIL) 2.5-0.025 MG tablet Take 1 tablet by mouth 4 (four) times daily as needed for diarrhea or loose stools. Reported on 09/09/2015 120 tablet 2  . empagliflozin (JARDIANCE) 10 MG TABS tablet Take 10 mg by mouth daily.    Marland Kitchen ezetimibe (ZETIA) 10 MG tablet Take 1 tablet (10 mg total) by  mouth daily. 90 tablet 3  . metoprolol tartrate (LOPRESSOR) 25 MG tablet Take 0.5 tablets (12.5 mg total) by mouth 2 (two) times daily. 90 tablet 0  . nitroGLYCERIN (NITROSTAT) 0.4 MG SL tablet Place 1 tablet (0.4 mg total) under the tongue every 5 (five) minutes x 3 doses as needed for chest pain. 100 tablet 0  . omega-3 acid ethyl esters (LOVAZA) 1 g capsule TAKE TWO CAPSULES BY MOUTH TWICE DAILY 360 capsule 0  . omeprazole (PRILOSEC OTC) 20 MG tablet Take 20 mg by mouth daily as needed (for acid reflux).     . ondansetron (ZOFRAN ODT) 8 MG disintegrating tablet Take 1 tablet (8 mg total) by mouth every 8 (eight) hours as needed for nausea or vomiting. 15 tablet 0  . OVER THE COUNTER MEDICATION Take 2 tablets by mouth daily. Patient takes 2 by mouth daily - Vita - Sprout Capsules - Multi Vitamin     . rosuvastatin (CRESTOR) 10 MG tablet Take 1 tablet twice weekly on Monday and Friday (Patient taking differently: Take 10 mg by mouth 2 (two) times a week. Take 1 tablet twice weekly on Monday and Friday) 60 tablet 0  . ticagrelor (BRILINTA) 60 MG TABS tablet Take 1 tablet (60 mg total) by mouth 2 (two) times daily. 180 tablet 3  . traMADol (ULTRAM) 50 MG tablet Take 1 tablet (50 mg total) by mouth every 6 (six) hours as needed. 25 tablet 0  . Turmeric (CURCUMIN 95) 500 MG CAPS Take 1 capsule by mouth daily.    Marland Kitchen ULORIC 40 MG tablet Take 40 mg by mouth daily.  1   No current facility-administered medications on file prior to visit.         Objective:   Physical Exam Blood pressure (!) 150/80, pulse 60, temperature 98.2 F (36.8 C), height 6\' 6"  (1.981 m), weight 233 lb 14.4 oz (106.1 kg). Alert and oriented. Skin warm and dry. Oral mucosa is moist.   . Sclera anicteric, conjunctivae is pink. Thyroid not enlarged. No cervical lymphadenopathy. Lungs clear. Heart regular rate and rhythm.  Abdomen is soft. Bowel sounds are positive. No hepatomegaly. No abdominal masses felt. No tenderness.  No edema  to lower extremities.   .        Assessment & Plan:  Probable pouchitis. He does not appear to be dehydrated. He is 95% better. He is tolerating liquids and Brat diet. Am going to give him an Rx for Levaquin x 10 days and Xifaxan BID OV in 1 year.

## 2017-07-10 DIAGNOSIS — I2510 Atherosclerotic heart disease of native coronary artery without angina pectoris: Secondary | ICD-10-CM | POA: Diagnosis not present

## 2017-07-10 DIAGNOSIS — D352 Benign neoplasm of pituitary gland: Secondary | ICD-10-CM | POA: Diagnosis not present

## 2017-07-10 DIAGNOSIS — E7849 Other hyperlipidemia: Secondary | ICD-10-CM | POA: Diagnosis not present

## 2017-07-10 DIAGNOSIS — Z1389 Encounter for screening for other disorder: Secondary | ICD-10-CM | POA: Diagnosis not present

## 2017-07-10 DIAGNOSIS — E1151 Type 2 diabetes mellitus with diabetic peripheral angiopathy without gangrene: Secondary | ICD-10-CM | POA: Diagnosis not present

## 2017-07-10 DIAGNOSIS — E291 Testicular hypofunction: Secondary | ICD-10-CM | POA: Diagnosis not present

## 2017-07-10 DIAGNOSIS — E559 Vitamin D deficiency, unspecified: Secondary | ICD-10-CM | POA: Diagnosis not present

## 2017-07-10 DIAGNOSIS — K5190 Ulcerative colitis, unspecified, without complications: Secondary | ICD-10-CM | POA: Diagnosis not present

## 2017-07-10 DIAGNOSIS — M109 Gout, unspecified: Secondary | ICD-10-CM | POA: Diagnosis not present

## 2017-08-30 ENCOUNTER — Encounter (INDEPENDENT_AMBULATORY_CARE_PROVIDER_SITE_OTHER): Payer: Self-pay | Admitting: *Deleted

## 2017-08-30 ENCOUNTER — Ambulatory Visit (INDEPENDENT_AMBULATORY_CARE_PROVIDER_SITE_OTHER): Payer: Commercial Managed Care - PPO | Admitting: Internal Medicine

## 2017-08-30 ENCOUNTER — Encounter (INDEPENDENT_AMBULATORY_CARE_PROVIDER_SITE_OTHER): Payer: Self-pay | Admitting: Internal Medicine

## 2017-08-30 VITALS — BP 140/82 | HR 64 | Temp 97.8°F | Ht 78.0 in | Wt 237.3 lb

## 2017-08-30 DIAGNOSIS — R1310 Dysphagia, unspecified: Secondary | ICD-10-CM

## 2017-08-30 DIAGNOSIS — R1013 Epigastric pain: Secondary | ICD-10-CM | POA: Diagnosis not present

## 2017-08-30 DIAGNOSIS — R1319 Other dysphagia: Secondary | ICD-10-CM

## 2017-08-30 NOTE — Progress Notes (Signed)
Subjective:    Patient ID: Calvin Cruz, male    DOB: Mar 17, 1960, 57 y.o.   MRN: [id number]  HPI  Presents today with ? Hiatal hernia. He thinks he has a hiatal hernia. He has epigastric pain. He took a Prilosec and pain was relieved.  The pain comes and goes. Epigastric pain in 2 weeks. No nausea or vomiting. No fever.  Bothered him all night Saturday but none since.  Under a lot of stress with work and grandson going into service. His appetite is okay. No weight loss. Sometimes foods are slow to go down.  Steak and cheese and breads are slow to go down.  Having 4-6 stools a day.   Hx of UC and underwent IPQAA in 2003. Hx of pouchitis.     Hx of stemi and has a cardiac stent.  and maintained on Brilinta Review of Systems Past Medical History:  Diagnosis Date  . CAD S/P DES PCI-circumflex 07/22/2014   PCI mCx: PCI: 2.75 x 18 mm Xience Alpine DES to mid LCx at takeoff of OM1 (OM1 jailed 50% stenosis)  . Erythrocytosis 11/28/2012  . Essential hypertension   . Fatigue 11/28/2012  . Gout 2014  . Hyperlipidemia with target LDL less than 70   . Hypertriglyceridemia 11/16/2014  . Non-STEMI (non-ST elevated myocardial infarction) ([place]) 07/22/2014   a) RCA: OK, LM: Okay ; LAD: Proximal to mid 40%; RI: 30%;LCx: Mid to distal 90%, distal 20%, OM1 50% --> DES PCI  . Ulcerative colitis    s/p ilioanal anastomosis (colectomy) ; has had multiple episodes of pouchitis.    Past Surgical History:  Procedure Laterality Date  . CARDIAC CATHETERIZATION N/A 07/23/2014   Procedure: Left Heart Cath and Coronary Angiography;  Surgeon: Leonie Man, MD;  Location: [facility];  Service: Cardiovascular;  RCA: Mild disease; LM: Okay ; LAD: Proximal to mid 40%; RI: 30%;LCx: Mid to distal 90%, distal 20%, OM1 50%; Mid inferolateral HK, EF 55-65%   . CARDIAC CATHETERIZATION N/A 07/23/2014   Procedure: Coronary Stent Intervention;  Surgeon: Leonie Man, MD;  Location: [facility];  Service:  Cardiovascular; PCI: 2.75 x 18 mm Xience Alpine DES to mid LCx at takeoff of OM1 (OM1 jailed 50% stenosis)  . COLECTOMY    . HERNIA REPAIR    . TRANSTHORACIC ECHOCARDIOGRAM  07/22/2014   EF 55-60%, normal wall motion, normal diastolic function,  no valve lesions     Allergies  Allergen Reactions  . Flagyl [Metronidazole] Itching  . Ciprofloxacin Itching and Nausea And Vomiting  . Doxycycline Itching and Other (See Comments)    Redness of skin, burning sensation  . Morphine And Related Itching    Current Outpatient Medications on File Prior to Visit  Medication Sig Dispense Refill  . acetaminophen (TYLENOL) 325 MG tablet Take 650 mg by mouth every 6 (six) hours as needed for mild pain.    Marland Kitchen acidophilus (RISAQUAD) CAPS capsule Take 2 capsules by mouth daily.    . Cholecalciferol (HM VITAMIN D3) 4000 units CAPS Take 1 capsule by mouth daily.    . Coenzyme Q10 (COQ10) 100 MG CAPS Take 100 mg by mouth daily. GRADUALLY INCREASE TO 300 MG DAILY (Patient taking differently: Take 100 mg by mouth daily. ) 90 each 11  . colchicine 0.6 MG tablet Take 0.6 mg by mouth daily as needed (for gout flare).     . diclofenac sodium (VOLTAREN) 1 % GEL Apply 2 g topically daily as needed (  for pain).     Marland Kitchen diphenoxylate-atropine (LOMOTIL) 2.5-0.025 MG tablet Take 1 tablet by mouth 4 (four) times daily as needed for diarrhea or loose stools. Reported on 09/09/2015 120 tablet 2  . empagliflozin (JARDIANCE) 10 MG TABS tablet Take 10 mg by mouth daily.    Marland Kitchen ezetimibe (ZETIA) 10 MG tablet Take 1 tablet (10 mg total) by mouth daily. 90 tablet 3  . metoprolol tartrate (LOPRESSOR) 25 MG tablet Take 0.5 tablets (12.5 mg total) by mouth 2 (two) times daily. 90 tablet 0  . nitroGLYCERIN (NITROSTAT) 0.4 MG SL tablet Place 1 tablet (0.4 mg total) under the tongue every 5 (five) minutes x 3 doses as needed for chest pain. 100 tablet 0  . omega-3 acid ethyl esters (LOVAZA) 1 g capsule TAKE TWO CAPSULES BY MOUTH TWICE DAILY 360  capsule 0  . omeprazole (PRILOSEC OTC) 20 MG tablet Take 20 mg by mouth daily as needed (for acid reflux).     Marland Kitchen OVER THE COUNTER MEDICATION Take 2 tablets by mouth daily. Patient takes 2 by mouth daily - Vita - Sprout Capsules - Multi Vitamin     . rifaximin (XIFAXAN) 550 MG TABS tablet Take 1 tablet (550 mg total) by mouth 2 (two) times daily. Reported on 05/17/2015 (Patient taking differently: Take 550 mg by mouth as needed. Reported on 05/17/2015) 28 tablet 1  . rosuvastatin (CRESTOR) 10 MG tablet Take 1 tablet twice weekly on Monday and Friday (Patient taking differently: Take 10 mg by mouth 2 (two) times a week. Take 1 tablet twice weekly on Monday and Friday) 60 tablet 0  . ticagrelor (BRILINTA) 60 MG TABS tablet Take 1 tablet (60 mg total) by mouth 2 (two) times daily. 180 tablet 3  . traMADol (ULTRAM) 50 MG tablet Take 1 tablet (50 mg total) by mouth every 6 (six) hours as needed. 25 tablet 0  . Turmeric (CURCUMIN 95) 500 MG CAPS Take 1 capsule by mouth daily.    Marland Kitchen ULORIC 40 MG tablet Take 40 mg by mouth daily.  1  . levofloxacin (LEVAQUIN) 500 MG tablet Take 1 tablet (500 mg total) by mouth daily. (Patient not taking: Reported on 08/30/2017) 10 tablet 1   No current facility-administered medications on file prior to visit.         Objective:   Physical Exam Blood pressure 140/82, pulse 64, temperature 97.8 F (36.6 C), height 6\' 6"  (1.981 m), weight 237 lb 4.8 oz (107.6 kg). Alert and oriented. Skin warm and dry. Oral mucosa is moist.   . Sclera anicteric, conjunctivae is pink. Thyroid not enlarged. No cervical lymphadenopathy. Lungs clear. Heart regular rate and rhythm.  Abdomen is soft. Bowel sounds are positive. No hepatomegaly. No abdominal masses felt. No tenderness.  No edema to lower extremities.          Assessment & Plan:  Epigastric pain and dysphagia.  Take the Prilosec daily. DG Esophagram

## 2017-08-30 NOTE — Patient Instructions (Addendum)
DG esophagram.   

## 2017-09-04 ENCOUNTER — Ambulatory Visit (HOSPITAL_COMMUNITY)
Admission: RE | Admit: 2017-09-04 | Discharge: 2017-09-04 | Disposition: A | Discharge status: Home / Self Care | Payer: Commercial Managed Care - PPO | Source: Ambulatory Visit | Attending: Internal Medicine | Admitting: Internal Medicine

## 2017-09-04 DIAGNOSIS — R1319 Other dysphagia: Secondary | ICD-10-CM

## 2017-09-04 DIAGNOSIS — R1310 Dysphagia, unspecified: Secondary | ICD-10-CM

## 2017-09-11 ENCOUNTER — Telehealth (INDEPENDENT_AMBULATORY_CARE_PROVIDER_SITE_OTHER): Payer: Self-pay | Admitting: Internal Medicine

## 2017-09-11 NOTE — Telephone Encounter (Signed)
Look at Calvin Cruz's esophagram.

## 2017-09-15 NOTE — Telephone Encounter (Signed)
Barium study reviewed. Patient should undergo EGD plus minus dilation before manometry considered. He also needs evaluation by speech pathologist because of laryngeal penetration. It is possible he has esophageal and pharyngeal motility disorder

## 2017-09-16 ENCOUNTER — Telehealth (HOSPITAL_COMMUNITY): Payer: Self-pay | Admitting: Endocrinology

## 2017-09-16 ENCOUNTER — Telehealth (INDEPENDENT_AMBULATORY_CARE_PROVIDER_SITE_OTHER): Payer: Self-pay | Admitting: Internal Medicine

## 2017-09-16 ENCOUNTER — Other Ambulatory Visit (HOSPITAL_COMMUNITY): Payer: Self-pay | Admitting: Specialist

## 2017-09-16 ENCOUNTER — Other Ambulatory Visit (INDEPENDENT_AMBULATORY_CARE_PROVIDER_SITE_OTHER): Payer: Self-pay | Admitting: Internal Medicine

## 2017-09-16 DIAGNOSIS — R1310 Dysphagia, unspecified: Secondary | ICD-10-CM

## 2017-09-16 DIAGNOSIS — R1319 Other dysphagia: Secondary | ICD-10-CM

## 2017-09-16 DIAGNOSIS — K219 Gastro-esophageal reflux disease without esophagitis: Secondary | ICD-10-CM

## 2017-09-16 NOTE — Progress Notes (Signed)
EGD/ED ordered

## 2017-09-16 NOTE — Telephone Encounter (Signed)
egd/ed

## 2017-09-16 NOTE — Telephone Encounter (Signed)
EGD/ED, speech pathology referral placed

## 2017-09-16 NOTE — Telephone Encounter (Signed)
Needs speech pathology referral

## 2017-09-16 NOTE — Telephone Encounter (Signed)
Referral placed, they contact patient to schedule

## 2017-09-16 NOTE — Telephone Encounter (Signed)
09/16/17  Spoke with patient's wife and gave her MBS date and time

## 2017-09-17 ENCOUNTER — Encounter (INDEPENDENT_AMBULATORY_CARE_PROVIDER_SITE_OTHER): Payer: Self-pay | Admitting: *Deleted

## 2017-09-17 DIAGNOSIS — R1319 Other dysphagia: Secondary | ICD-10-CM | POA: Insufficient documentation

## 2017-09-17 DIAGNOSIS — R1310 Dysphagia, unspecified: Secondary | ICD-10-CM | POA: Insufficient documentation

## 2017-09-17 NOTE — Telephone Encounter (Signed)
EGD/ED sch'd 10/30/17, patient's wife aware, instructions mailed

## 2017-09-18 ENCOUNTER — Telehealth: Payer: Self-pay | Admitting: Cardiology

## 2017-09-18 NOTE — Telephone Encounter (Signed)
New Message            [facility] Pre-operative Risk Assessment    Request for surgical clearance:  1. What type of surgery is being performed? Endoscopy  2. When is this surgery scheduled? 10/30/2017  3. What type of clearance is required (medical clearance vs. Pharmacy clearance to hold med vs. Both)? Medical   4. Are there any medications that need to be held prior to surgery and how long? Brilinta x 2 days   5. Practice name and name of physician performing surgery? Dr Laural Golden  6. What is your office phone number [phone]   7.   What is your office fax number [phone]   8.   Anesthesia type (None, local, MAC, general) ? General   Calvin Cruz 09/18/2017, 9:12 AM  _________________________________________________________________   (provider comments below)

## 2017-09-19 ENCOUNTER — Encounter (HOSPITAL_COMMUNITY): Payer: Self-pay

## 2017-09-19 ENCOUNTER — Other Ambulatory Visit: Payer: Self-pay

## 2017-09-19 ENCOUNTER — Other Ambulatory Visit: Payer: Self-pay | Admitting: Cardiology

## 2017-09-19 ENCOUNTER — Encounter (HOSPITAL_COMMUNITY): Payer: Self-pay | Admitting: Speech Pathology

## 2017-09-19 ENCOUNTER — Ambulatory Visit (HOSPITAL_COMMUNITY)
Admission: RE | Admit: 2017-09-19 | Discharge: 2017-09-19 | Disposition: A | Discharge status: Home / Self Care | Payer: Commercial Managed Care - PPO | Source: Ambulatory Visit | Attending: Internal Medicine | Admitting: Internal Medicine

## 2017-09-19 ENCOUNTER — Ambulatory Visit (HOSPITAL_COMMUNITY): Payer: Commercial Managed Care - PPO | Attending: Internal Medicine | Admitting: Speech Pathology

## 2017-09-19 DIAGNOSIS — R1310 Dysphagia, unspecified: Secondary | ICD-10-CM | POA: Insufficient documentation

## 2017-09-19 DIAGNOSIS — K219 Gastro-esophageal reflux disease without esophagitis: Secondary | ICD-10-CM

## 2017-09-19 NOTE — Therapy (Signed)
[name], [place], [address] Phone: [phone]   Fax:  [phone]  Speech Language Pathology Evaluation/Clinical Swallow Evaluation  Patient Details  Name: Calvin Cruz MRN: [id number] Date of Birth: February 09, 1961 No data recorded  Encounter Date: 09/19/2017  End of Session - 09/19/17 1641    Visit Number  1    Number of Visits  1    Authorization Type  [employer] PPO $60 copay, no deductible, OOP$4000    SLP Start Time  1344    SLP Stop Time   1435    SLP Time Calculation (min)  51 min    Activity Tolerance  Patient tolerated treatment well       Past Medical History:  Diagnosis Date  . CAD S/P DES PCI-circumflex 07/22/2014   PCI mCx: PCI: 2.75 x 18 mm Xience Alpine DES to mid LCx at takeoff of OM1 (OM1 jailed 50% stenosis)  . Erythrocytosis 11/28/2012  . Essential hypertension   . Fatigue 11/28/2012  . Gout 2014  . Hyperlipidemia with target LDL less than 70   . Hypertriglyceridemia 11/16/2014  . Non-STEMI (non-ST elevated myocardial infarction) ([place]) 07/22/2014   a) RCA: OK, LM: Okay ; LAD: Proximal to mid 40%; RI: 30%;LCx: Mid to distal 90%, distal 20%, OM1 50% --> DES PCI  . Ulcerative colitis    s/p ilioanal anastomosis (colectomy) ; has had multiple episodes of pouchitis.    Past Surgical History:  Procedure Laterality Date  . CARDIAC CATHETERIZATION N/A 07/23/2014   Procedure: Left Heart Cath and Coronary Angiography;  Surgeon: Leonie Man, MD;  Location: [facility];  Service: Cardiovascular;  RCA: Mild disease; LM: Okay ; LAD: Proximal to mid 40%; RI: 30%;LCx: Mid to distal 90%, distal 20%, OM1 50%; Mid inferolateral HK, EF 55-65%   . CARDIAC CATHETERIZATION N/A 07/23/2014   Procedure: Coronary Stent Intervention;  Surgeon: Leonie Man, MD;  Location: [facility];  Service: Cardiovascular; PCI: 2.75 x 18 mm Xience Alpine DES to mid LCx at takeoff of OM1 (OM1 jailed 50% stenosis)  .  COLECTOMY    . HERNIA REPAIR    . TRANSTHORACIC ECHOCARDIOGRAM  07/22/2014   EF 55-60%, normal wall motion, normal diastolic function,  no valve lesions     There were no vitals filed for this visit.  Subjective Assessment - 09/19/17 1638    Subjective  "It has been feeling better since I started the [facility]."    Patient is accompained by:  Family member    Currently in Pain?  No/denies            Prior Functional Status - 09/19/17 1639      Prior Functional Status   Cognitive/Linguistic Baseline  Within functional limits    Type of Home  House     Lives With  Spouse    Available Help at Discharge  Family    Vocation  Full time employment      General - 09/19/17 1639      General Information   Date of Onset  09/04/17    HPI  Calvin Cruz is a 57 yo male who was referred by Dr. Laural Golden for MBSS following a barium swallow on 09/04/17 which showed laryngeal penetration without aspiration. Upon arrival to radiology suite however, the Pt tells SLP that he was "sick for two days" after completing barium swallow and he did not want to ingest barium today. SLP spoke with Pt/spouse and  together we decided to complete a clinical swallow evaluation. Pt reports occasional pain when swallowing certain meats/breads (cheese steak from [employer]) and states that it feels like it won't go down. He is most concerned about the "pain" he feels when this happens because he has a cardiac event history. He had a barium swallow on 09/04/17 which showed laryngeal penetration (Pt states he was cued to clear his throat and it was removed x1).     Type of Study  Bedside Swallow Evaluation    Previous Swallow Assessment  Barium Swallow showed laryngeal penetration x1    Diet Prior to this Study  Regular;Thin liquids    Temperature Spikes Noted  No    Respiratory Status  Room air    History of Recent Intubation  No    Behavior/Cognition  Cooperative;Alert;Pleasant mood    Oral Cavity Assessment  Within  Functional Limits    Oral Care Completed by SLP  No    Oral Cavity - Dentition  Adequate natural dentition    Vision  Functional for self-feeding    Patient Positioning  Upright in chair    Baseline Vocal Quality  Normal    Volitional Cough  Strong    Volitional Swallow  Able to elicit       Oral Motor/Sensory Function - 09/19/17 1640      Oral Motor/Sensory Function   Overall Oral Motor/Sensory Function  Within functional limits       Thin Liquid - 09/19/17 1640      Thin Liquid   Thin Liquid  Within functional limits    Presentation  Cup;Self Fed;Straw        Puree - 09/19/17 1640      Puree   Puree  Within functional limits    Presentation  Spoon;Self Fed      Solid - 09/19/17 1641      Solid   Solid  Within functional limits    Presentation  Self Fed          SLP Education - 09/19/17 1638    Education Details  Recommend that Pt take small bites of solids/liquids, chew thoroughly, and sit fully upright during meals and after    Person(s) Educated  Patient;Spouse    Methods  Explanation    Comprehension  Verbalized understanding       Plan - 09/19/17 1642    Clinical Impression Statement  Pt stated that he did not want to complete MBSS due to previous trouble tolerating barium last month, but was agreable to a clinical swallow evaluation. SLP and Pt/spouse participated in a thoughtful discussion regarding current symptoms and results of barium swallow. His barium swallow showed laryngeal penetration x1 that was cleared with a cued throat clear (penetration can be normal above age 110). Pt denies recent respiratory difficulties, PNA, wet vocal quality, feeling of choking, or shortness of breath. He also reports an improvement in his symptoms (pain in mid sternum area) with the addition of Dexilant. Oral motor examination is WNL and pt shows no overt signs or symptoms of aspiration with consistencies and textures presented. SLP advised Pt to adhere to aspiration  and reflux precautions to control symptoms. Specifically, he was encouraged to eat fully upright and take small bites/sips. He reports that he occasionally eats reclined and that he eats very rapidly at work. Do not feel that MBSS is necessary at this time, however EGD may be helpful given his symptoms with breads and meats in esophageal area (although he reports  improvement with Dexilant). If an objective assessment of oropharyngeal swallow is desired per MD, Pt would need to go to [facility] in [place] for outpatient FEES if he does not want to ingest barium. Pt and spouse agreeable with recommendations and plan of care. No further SLP follow up indicated at this time.    Consulted and Agree with Plan of Care  Patient;Family member/caregiver    Family Member Consulted  spouse       Patient will benefit from skilled therapeutic intervention in order to improve the following deficits and impairments:   Dysphagia, unspecified type    Problem List Patient Active Problem List   Diagnosis Date Noted  . Esophageal dysphagia 09/17/2017  . Polycythemia vera ([facility]) 09/09/2016  . Enterocolitis 04/20/2015  . Hypertriglyceridemia 11/16/2014  . Hyperlipidemia with target LDL less than 70   . Essential hypertension   . NSTEMI (non-ST elevated myocardial infarction) ([place]) 07/22/2014  . Ulcerative colitis ([place]) 07/22/2014  . CAD S/P DES PCI-circumflex 07/22/2014  . Pouchitis ([place] [facility]) 12/22/2012  . Ventral hernia 12/22/2012  . Hypogonadism male 12/22/2012  . Fatigue 11/28/2012  . Erythrocytosis 11/28/2012  . H/O resection of large bowel 11/28/2012   Thank you,  Genene Churn, [place]  [facility] 09/19/2017, 4:44 PM  [facility] [address] [place], [place], [address] Phone: [phone]   Fax:  [phone]  Name: Calvin Cruz MRN: [id number] Date of Birth: Jun 22, 1960

## 2017-09-20 NOTE — Telephone Encounter (Signed)
   Primary Cardiologist:No primary care provider on file.  Chart reviewed as part of pre-operative protocol coverage. Because of Calvin Cruz's past medical history and time since last visit, he/she will require a follow-up visit in order to better assess preoperative cardiovascular risk. He was due for yearly follow-up in 07/2017 but he has not seen or scheduled yet.  Furthermore it is a little unusual to hold Brilinta only 2 days so this request will need to be clarified at time of office visit (this is antiplatelet agent, not anticoag agent).  Pre-op covering staff: - Please schedule appointment and call patient to inform them. - Please contact requesting surgeon's office via preferred method (i.e, phone, fax) to inform them of need for appointment prior to surgery.  Charlie Pitter, PA-C  09/20/2017, 10:39 AM

## 2017-09-20 NOTE — Telephone Encounter (Signed)
Spoke with pt wife, Maudie Mercury, [place] on file, re: surgical clearance for Endoscopy. Pt has been scheduled to see Doreene Adas, PA-C 10/11/17. She thanked me for the call.

## 2017-10-02 DIAGNOSIS — J3489 Other specified disorders of nose and nasal sinuses: Secondary | ICD-10-CM | POA: Diagnosis not present

## 2017-10-10 NOTE — Progress Notes (Signed)
Cardiology Office Note:    Date:  10/11/2017   ID:  Calvin Cruz, DOB May 15, 1960, MRN [id number]  PCP:  Reynold Bowen, MD  Cardiologist:  Glenetta Hew, MD   Referring MD: Reynold Bowen, MD   Chief Complaint  Patient presents with  . Chest Pain    History of Present Illness:    Calvin Cruz is a 57 y.o. male with a hx of CAD with NSTEMI status post DES to circumflex in 2016, hypertension, hyperlipidemia, and esophageal dysphasia.  He was last seen in clinic on 08/13/2016 by Dr. Ellyn Hack.  It was noted that he was 1 year after PCI at that time and maintained on 60 mg Brilinta twice daily.  There is a question of affordability at that time.  Continued on beta-blocker and Crestor, lovaza, and Zetia for high triglycerides.  He presents today for preoperative clearance for EGD.  He has a complicated history of GI problems including several bouts of pouchitis.  Provider requesting to hold Brilinta for 2 days prior to EGD.  On my interview, he states that he is not having a surgical procedure but is here for chest pain. He describes epigastric pain that radiates to his upper stomach area. This pain is always relieved with his GERD medications. He was on dexilant and his epigastric/chest pain completely resolved. In addition, his wife reports that omeprazole always relieves this pain. These symptoms are not his anginal equivalent. EKG today with normal sinus rhythm. He denies shortness of breath, palpitations, lower extremity swelling, dizziness, nausea and vomiting, and syncope.    He has a lot of anxiety surrounding his cardiovascular health. We discussed how his symptoms seem related to his GI issues and reviewed his EKG. We discussed stress test, but he will defer at this time. He and his family are also under a lot of financial and social stress.   Past Medical History:  Diagnosis Date  . CAD S/P DES PCI-circumflex 07/22/2014   PCI mCx: PCI: 2.75 x 18 mm Xience Alpine DES to mid LCx at  takeoff of OM1 (OM1 jailed 50% stenosis)  . Erythrocytosis 11/28/2012  . Essential hypertension   . Fatigue 11/28/2012  . Gout 2014  . Hyperlipidemia with target LDL less than 70   . Hypertriglyceridemia 11/16/2014  . Non-STEMI (non-ST elevated myocardial infarction) ([place]) 07/22/2014   a) RCA: OK, LM: Okay ; LAD: Proximal to mid 40%; RI: 30%;LCx: Mid to distal 90%, distal 20%, OM1 50% --> DES PCI  . Ulcerative colitis    s/p ilioanal anastomosis (colectomy) ; has had multiple episodes of pouchitis.    Past Surgical History:  Procedure Laterality Date  . CARDIAC CATHETERIZATION N/A 07/23/2014   Procedure: Left Heart Cath and Coronary Angiography;  Surgeon: Leonie Man, MD;  Location: [facility];  Service: Cardiovascular;  RCA: Mild disease; LM: Okay ; LAD: Proximal to mid 40%; RI: 30%;LCx: Mid to distal 90%, distal 20%, OM1 50%; Mid inferolateral HK, EF 55-65%   . CARDIAC CATHETERIZATION N/A 07/23/2014   Procedure: Coronary Stent Intervention;  Surgeon: Leonie Man, MD;  Location: [facility];  Service: Cardiovascular; PCI: 2.75 x 18 mm Xience Alpine DES to mid LCx at takeoff of OM1 (OM1 jailed 50% stenosis)  . COLECTOMY    . HERNIA REPAIR    . TRANSTHORACIC ECHOCARDIOGRAM  07/22/2014   EF 55-60%, normal wall motion, normal diastolic function,  no valve lesions     Current Medications: Current Meds  Medication Sig  .  acetaminophen (TYLENOL) 325 MG tablet Take 650 mg by mouth every 6 (six) hours as needed for mild pain.  Marland Kitchen acidophilus (RISAQUAD) CAPS capsule Take 2 capsules by mouth daily.  . Cholecalciferol (HM VITAMIN D3) 4000 units CAPS Take 1 capsule by mouth daily.  . Coenzyme Q10 (COQ10) 100 MG CAPS Take 100 mg by mouth daily. GRADUALLY INCREASE TO 300 MG DAILY (Patient taking differently: Take 100 mg by mouth daily. )  . colchicine 0.6 MG tablet Take 0.6 mg by mouth daily as needed (for gout flare).   . diclofenac sodium (VOLTAREN) 1 % GEL Apply 2 g topically  daily as needed (for pain).   Marland Kitchen diphenoxylate-atropine (LOMOTIL) 2.5-0.025 MG tablet Take 1 tablet by mouth 4 (four) times daily as needed for diarrhea or loose stools. Reported on 09/09/2015  . empagliflozin (JARDIANCE) 10 MG TABS tablet Take 10 mg by mouth daily.  Marland Kitchen ezetimibe (ZETIA) 10 MG tablet Take 1 tablet (10 mg total) by mouth daily.  Marland Kitchen levofloxacin (LEVAQUIN) 500 MG tablet Take 1 tablet (500 mg total) by mouth daily.  . metoprolol tartrate (LOPRESSOR) 25 MG tablet Take 0.5 tablets (12.5 mg total) by mouth 2 (two) times daily.  . nitroGLYCERIN (NITROSTAT) 0.4 MG SL tablet Place 1 tablet (0.4 mg total) under the tongue every 5 (five) minutes x 3 doses as needed for chest pain.  Marland Kitchen omega-3 acid ethyl esters (LOVAZA) 1 g capsule TAKE TWO CAPSULES BY MOUTH TWICE DAILY  . omeprazole (PRILOSEC OTC) 20 MG tablet Take 20 mg by mouth daily as needed (for acid reflux).   Marland Kitchen OVER THE COUNTER MEDICATION Take 2 tablets by mouth daily. Patient takes 2 by mouth daily - Vita - Sprout Capsules - Multi Vitamin   . rifaximin (XIFAXAN) 550 MG TABS tablet Take 1 tablet (550 mg total) by mouth 2 (two) times daily. Reported on 05/17/2015 (Patient taking differently: Take 550 mg by mouth as needed. Reported on 05/17/2015)  . rosuvastatin (CRESTOR) 10 MG tablet Take 1 tablet twice weekly on Monday and Friday (Patient taking differently: Take 10 mg by mouth 2 (two) times a week. Take 1 tablet twice weekly on Monday and Friday)  . ticagrelor (BRILINTA) 60 MG TABS tablet Take 1 tablet (60 mg total) by mouth 2 (two) times daily. Please call and schedule an appt for further refills. 1st attempt  . traMADol (ULTRAM) 50 MG tablet Take 1 tablet (50 mg total) by mouth every 6 (six) hours as needed.  . Turmeric (CURCUMIN 95) 500 MG CAPS Take 1 capsule by mouth daily.  Marland Kitchen ULORIC 40 MG tablet Take 40 mg by mouth daily.  . [DISCONTINUED] nitroGLYCERIN (NITROSTAT) 0.4 MG SL tablet Place 1 tablet (0.4 mg total) under the tongue every 5  (five) minutes x 3 doses as needed for chest pain.     Allergies:   Metronidazole; Ciprofloxacin; Dexilant [dexlansoprazole]; Morphine; Morphine and related; and Doxycycline   Social History   Socioeconomic History  . Marital status: Married    Spouse name: Not on file  . Number of children: Not on file  . Years of education: Not on file  . Highest education level: Not on file  Occupational History  . Not on file  Social Needs  . Financial resource strain: Not on file  . Food insecurity:    Worry: Not on file    Inability: Not on file  . Transportation needs:    Medical: Not on file    Non-medical: Not on file  Tobacco  Use  . Smoking status: Former Smoker    Packs/day: 1.00    Years: 25.00    Pack years: 25.00    Types: Cigarettes    Last attempt to quit: 12/22/2001    Years since quitting: 15.8  . Smokeless tobacco: Never Used  Substance and Sexual Activity  . Alcohol use: No    Alcohol/week: 0.0 standard drinks  . Drug use: No  . Sexual activity: Not on file  Lifestyle  . Physical activity:    Days per week: Not on file    Minutes per session: Not on file  . Stress: Not on file  Relationships  . Social connections:    Talks on phone: Not on file    Gets together: Not on file    Attends religious service: Not on file    Active member of club or organization: Not on file    Attends meetings of clubs or organizations: Not on file    Relationship status: Not on file  Other Topics Concern  . Not on file  Social History Narrative  . Not on file     Family History: The patient's family history includes Cancer (age of onset: 68) in his father; Coronary artery disease in his mother; Diabetes in his brother and mother.  ROS:   Please see the history of present illness.    All other systems reviewed and are negative.  EKGs/Labs/Other Studies Reviewed:    The following studies were reviewed today:  Left heart cath 07/23/14: 1. Severe single-vessel disease  involving the mid circumflex at the takeoff of OM1 2. A Xience alpine drug-eluting stent was placed. Reducing this circumflex stenosis to 0% 3. Ost 1st Mrg lesion - following stent placement. This is the jailed OM, 50% stenosed. 4. Prox LAD to Mid LAD lesion, 40% stenosed. 5. Well-preserved LVEF with normal LVEDP. Mild inferolateral hypokinesis   Successful PCI of bifurcation lesion in the mid Circumflex-OM and a single Xience Alpine DES stent postdilated to 3.0 mm from 2.75 mm.  Recommendations: 6. Standard post radial cath PCI care with TR band removal. 7. Dual antiplatelet therapy for minimum one year. 8. Continue aggressive risk factor modification: Beta blocker, statin, plus or minus ACE inhibitor pending blood pressure.   Echo 07/22/14: Study Conclusions - Left ventricle: The cavity size was normal. Systolic function was   normal. The estimated ejection fraction was in the range of 55%   to 60%. Wall motion was normal; there were no regional wall   motion abnormalities. Left ventricular diastolic function   parameters were normal.  EKG:  EKG is ordered today.  The ekg ordered today demonstrates sinus rhythm  Recent Labs: 12/12/2016: HGB 16.8; Platelets 179  Recent Lipid Panel    Component Value Date/Time   CHOL 181 12/12/2015 1114   TRIG 419 (H) 12/12/2015 1114   HDL 30 (L) 12/12/2015 1114   CHOLHDL 6.0 (H) 12/12/2015 1114   VLDL NOT CALC 12/12/2015 1114   LDLCALC NOT CALC 12/12/2015 1114   LDLDIRECT 93 12/12/2015 1114    Physical Exam:    VS:  BP 132/83   Pulse 80   Ht 6\' 6"  (1.981 m)   Wt 239 lb 6.4 oz (108.6 kg)   BMI 27.67 kg/m     Wt Readings from Last 3 Encounters:  10/11/17 239 lb 6.4 oz (108.6 kg)  08/30/17 237 lb 4.8 oz (107.6 kg)  05/30/17 233 lb 14.4 oz (106.1 kg)     GEN: Well nourished, well  developed in no acute distress HEENT: Normal NECK: No JVD; No carotid bruits CARDIAC: RRR, no murmurs, rubs, gallops RESPIRATORY:  Clear to  auscultation without rales, wheezing or rhonchi  ABDOMEN: Soft, non-tender, non-distended MUSCULOSKELETAL:  No edema; No deformity  SKIN: Warm and dry NEUROLOGIC:  Alert and oriented x 3 PSYCHIATRIC:  Normal affect   ASSESSMENT:    1. Chest pain, unspecified type   2. CAD S/P DES PCI-circumflex   3. Essential hypertension   4. Hyperlipidemia with target LDL less than 70    PLAN:    In order of problems listed above:  Chest pain, unspecified type - Plan: EKG 12-Lead CAD, and STEMI status post DES to circumflex in 2016 He continues aspirin and 60 mg Brilinta twice daily.  He is having no trouble with bleeding.  I do not think that his chest pain description sounds anginal in nature.  His chest pain is epigastric and is relieved with his GI medicines.  His EKG is without signs of ischemia. He has a lot of anxiety surrounding his cardiac health. He and his family are also under financial and social stress.  We discussed possible stress test.  He would like to defer at this time.  We will schedule him a follow-up in 3 to 4 months with Dr. Ellyn Hack.  If he is having no further symptoms he may cancel this appointment. We also discussed the option of taking nitro if he though he was having anginal pain. We reviewed when he needed to call the office and when to report to the ER. He will call if he has a change in symptoms and we will then proceed with ischemic evaluation.   Hypertension  Pressure is well controlled.  No medication changes.  Hyperlipidemia Will need repeat fasting labs at next visit. Continue statin, lovaza, and Zetia   Follow-up with Dr. Ellyn Hack in 3 to 4 months.   Medication Adjustments/Labs and Tests Ordered: Current medicines are reviewed at length with the patient today.  Concerns regarding medicines are outlined above.  Orders Placed This Encounter  Procedures  . EKG 12-Lead   Meds ordered this encounter  Medications  . nitroGLYCERIN (NITROSTAT) 0.4 MG SL tablet      Sig: Place 1 tablet (0.4 mg total) under the tongue every 5 (five) minutes x 3 doses as needed for chest pain.    Dispense:  25 tablet    Refill:  3    Signed, Ledora Bottcher, [place]  10/11/2017 4:15 PM    [facility]

## 2017-10-11 ENCOUNTER — Ambulatory Visit: Payer: Commercial Managed Care - PPO | Admitting: Physician Assistant

## 2017-10-11 ENCOUNTER — Encounter: Payer: Self-pay | Admitting: Physician Assistant

## 2017-10-11 VITALS — BP 132/83 | HR 80 | Ht 78.0 in | Wt 239.4 lb

## 2017-10-11 DIAGNOSIS — I10 Essential (primary) hypertension: Secondary | ICD-10-CM | POA: Diagnosis not present

## 2017-10-11 DIAGNOSIS — Z9861 Coronary angioplasty status: Secondary | ICD-10-CM

## 2017-10-11 DIAGNOSIS — R079 Chest pain, unspecified: Secondary | ICD-10-CM

## 2017-10-11 DIAGNOSIS — E785 Hyperlipidemia, unspecified: Secondary | ICD-10-CM

## 2017-10-11 DIAGNOSIS — I2510 Atherosclerotic heart disease of native coronary artery without angina pectoris: Secondary | ICD-10-CM

## 2017-10-11 MED ORDER — NITROGLYCERIN 0.4 MG SL SUBL: 0.4000 mg | SUBLINGUAL_TABLET | SUBLINGUAL | 3 refills | Status: AC | PRN

## 2017-10-11 NOTE — Patient Instructions (Signed)
Medication Instructions:  No changes. If you need a refill on your cardiac medications before your next appointment, please call your pharmacy.  Labwork: None Ordered.  Testing/Procedures: None Ordered.  Follow-Up: Your physician wants you to follow-up in: 3 months with ONLY Dr.Harding.    Thank you for choosing [facility] at [facility]!!

## 2017-10-19 ENCOUNTER — Other Ambulatory Visit: Payer: Self-pay | Admitting: Cardiology

## 2017-10-23 NOTE — Telephone Encounter (Signed)
Rx request sent to pharmacy.  

## 2017-10-30 ENCOUNTER — Ambulatory Visit (HOSPITAL_COMMUNITY): Admit: 2017-10-30 | Payer: Commercial Managed Care - PPO | Admitting: Internal Medicine

## 2017-10-30 ENCOUNTER — Encounter (HOSPITAL_COMMUNITY): Payer: Self-pay

## 2017-10-30 SURGERY — EGD (ESOPHAGOGASTRODUODENOSCOPY)
Anesthesia: Moderate Sedation

## 2017-12-11 DIAGNOSIS — E7849 Other hyperlipidemia: Secondary | ICD-10-CM | POA: Diagnosis not present

## 2017-12-11 DIAGNOSIS — E1151 Type 2 diabetes mellitus with diabetic peripheral angiopathy without gangrene: Secondary | ICD-10-CM | POA: Diagnosis not present

## 2017-12-11 DIAGNOSIS — I2510 Atherosclerotic heart disease of native coronary artery without angina pectoris: Secondary | ICD-10-CM | POA: Diagnosis not present

## 2018-01-03 ENCOUNTER — Encounter: Payer: Self-pay | Admitting: Cardiology

## 2018-01-03 ENCOUNTER — Ambulatory Visit: Payer: Commercial Managed Care - PPO | Admitting: Cardiology

## 2018-01-03 VITALS — BP 126/76 | HR 80 | Ht 78.0 in | Wt 233.0 lb

## 2018-01-03 DIAGNOSIS — E785 Hyperlipidemia, unspecified: Secondary | ICD-10-CM | POA: Diagnosis not present

## 2018-01-03 DIAGNOSIS — I214 Non-ST elevation (NSTEMI) myocardial infarction: Secondary | ICD-10-CM | POA: Diagnosis not present

## 2018-01-03 DIAGNOSIS — I2510 Atherosclerotic heart disease of native coronary artery without angina pectoris: Secondary | ICD-10-CM

## 2018-01-03 DIAGNOSIS — Z9861 Coronary angioplasty status: Secondary | ICD-10-CM

## 2018-01-03 DIAGNOSIS — I10 Essential (primary) hypertension: Secondary | ICD-10-CM | POA: Diagnosis not present

## 2018-01-03 DIAGNOSIS — E781 Pure hyperglyceridemia: Secondary | ICD-10-CM

## 2018-01-03 NOTE — Assessment & Plan Note (Signed)
Is on Lovaza, but labs still show elevated triglycerides.  May benefit from either switching to Vascepa versus fenofibrate.

## 2018-01-03 NOTE — Assessment & Plan Note (Addendum)
Essentially only had the circumflex disease with a jailed OM1 after PCI.  Otherwise minimal disease.  No significant symptoms since his PCI.  He does have some GERD symptoms but they do not seem at all associated with his angina. He is on a low-dose of metoprolol and maintenance dose Brilinta.  On Zetia plus Crestor 10 mg 2 days a week.  Also was switched to [facility] for diabetes.  No symptoms.  No change  Would be okay to hold Brilinta for surgeries or procedures.

## 2018-01-03 NOTE — Assessment & Plan Note (Signed)
Pressures look great only tolerating low-dose beta-blocker.  Probably not a true diagnosis

## 2018-01-03 NOTE — Progress Notes (Signed)
PCP: Reynold Bowen, MD  Clinic Note: Chief Complaint  Patient presents with  . Follow-up    Doing well.  No major symptoms  . Coronary Artery Disease   32 HPI: Calvin Cruz is a 57 y.o. male with a PMH of NSTEMI-CAD- PCI Cx (07/2014) below who presents today for 98-month follow-up.  Has UC with frequent GI issues (s/p ileoanal anastomosis 14 years ago with a creation of a J-pouch secondary to ulcerative colitis. He has had many bouts of pouchitis.).  Completed 3rd round of Levaquin. PCP Dx - Polycythemia & low Testosterone.  Calvin Cruz was last seen on 07/28/2016 -was doing fairly well.  Had some issues because of a new job that paid last.  Change in insurance made it difficult to pay for medications.  Was stable from a cardiac standpoint. This year he was seen on 10/11/2017 by Fabian Sharp, PA for Pre-op Clearance  Recent Hospitalizations: [facility] 07/14/16  Studies Personally Reviewed - (if available, images/films reviewed: From Epic Chart or Care Everywhere)  n/a  Interval History: Bricen is doing quite well today.  He every now and then has brief little spells lasting 15 to 20 minutes of a fleeting type of burning sensation on the right side of his chest.  This is usually taken care of by taking a Prilosec within an hour.  Very different from his anginal type symptoms which was mostly a jaw pain radiating into his neck. He is otherwise not had any resting or exertional anginal type chest or jaw pain.  No exertional dyspnea.  No PND, orthopnea with only end of day edema from being on his feet all day long.    Remainder of cardiac review of symptoms: No palpitations, lightheadedness, dizziness, weakness or syncope/near syncope. No TIA/amaurosis fugax symptoms. No claudication.  He is overall doing well and very active.  Seems to be a good mood now.  He does get a little bit worried about any symptoms in his chest.  He also has intermittent episodes of pouchitis for which she now  has PRN doses of Levaquin.   ROS: A comprehensive was performed. Review of Systems  Constitutional: Negative for malaise/fatigue.  HENT: Positive for congestion.   Eyes: Negative for blurred vision.  Respiratory: Positive for cough. Negative for shortness of breath.        Getting over a cold from last week  Cardiovascular: Negative.  Negative for claudication and leg swelling.       Per HPI  Gastrointestinal: Positive for abdominal pain (related to UC- pouchitis etc) and heartburn (Both heartburn and acid reflux sensation in the mouth.). Negative for blood in stool (none recently), constipation, diarrhea (Only if he eats foods that will cause it.) and melena.  Genitourinary: Negative for hematuria.  Musculoskeletal: Negative for falls and joint pain.  Neurological: Negative for dizziness (Chronicly dizzy - balance issues), weakness and headaches.  Endo/Heme/Allergies: Negative for environmental allergies.  Psychiatric/Behavioral: Negative for depression and memory loss. The patient is not nervous/anxious and does not have insomnia.   All other systems reviewed and are negative.  I have reviewed and (if needed) personally updated the patient's problem list, medications, allergies, past medical and surgical history, social and family history.   Past Medical History:  Diagnosis Date  . CAD S/P DES PCI-circumflex 07/22/2014   PCI mCx: PCI: 2.75 x 18 mm Xience Alpine DES to mid LCx at takeoff of OM1 (OM1 jailed 50% stenosis)  . Erythrocytosis 11/28/2012  . Essential hypertension   .  Fatigue 11/28/2012  . Gout 2014  . Hyperlipidemia with target LDL less than 70   . Hypertriglyceridemia 11/16/2014  . Non-STEMI (non-ST elevated myocardial infarction) ([facility]) 07/22/2014   a) RCA: OK, LM: Okay ; LAD: Proximal to mid 40%; RI: 30%;LCx: Mid to distal 90%, distal 20%, OM1 50% --> DES PCI  . Ulcerative colitis    s/p ilioanal anastomosis (colectomy) ; has had multiple episodes of pouchitis.     Past Surgical History:  Procedure Laterality Date  . CARDIAC CATHETERIZATION N/A 07/23/2014   Procedure: Left Heart Cath and Coronary Angiography;  Surgeon: Leonie Man, MD;  Location: [facility];  Service: Cardiovascular;  RCA: Mild disease; LM: Okay ; LAD: Proximal to mid 40%; RI: 30%;LCx: Mid to distal 90%, distal 20%, OM1 50%; Mid inferolateral HK, EF 55-65%   . CARDIAC CATHETERIZATION N/A 07/23/2014   Procedure: Coronary Stent Intervention;  Surgeon: Leonie Man, MD;  Location: [facility];  Service: Cardiovascular; PCI: 2.75 x 18 mm Xience Alpine DES to mid LCx at takeoff of OM1 (OM1 jailed 50% stenosis)  . COLECTOMY    . HERNIA REPAIR    . TRANSTHORACIC ECHOCARDIOGRAM  07/22/2014   EF 55-60%, normal wall motion, normal diastolic function,  no valve lesions      Current Meds  Medication Sig  . acetaminophen (TYLENOL) 325 MG tablet Take 650 mg by mouth every 6 (six) hours as needed for mild pain.  Marland Kitchen acidophilus (RISAQUAD) CAPS capsule Take 2 capsules by mouth daily.  . Cholecalciferol (HM VITAMIN D3) 4000 units CAPS Take 1 capsule by mouth daily.  . Coenzyme Q10 (COQ10) 100 MG CAPS Take 100 mg by mouth daily. GRADUALLY INCREASE TO 300 MG DAILY (Patient taking differently: Take 100 mg by mouth daily. )  . colchicine 0.6 MG tablet Take 0.6 mg by mouth daily as needed (for gout flare).   . diclofenac sodium (VOLTAREN) 1 % GEL Apply 2 g topically daily as needed (for pain).   Marland Kitchen diphenoxylate-atropine (LOMOTIL) 2.5-0.025 MG tablet Take 1 tablet by mouth 4 (four) times daily as needed for diarrhea or loose stools. Reported on 09/09/2015  . empagliflozin (JARDIANCE) 10 MG TABS tablet Take 10 mg by mouth daily.  Marland Kitchen ezetimibe (ZETIA) 10 MG tablet Take 1 tablet (10 mg total) by mouth daily.  Marland Kitchen levofloxacin (LEVAQUIN) 500 MG tablet Take 1 tablet (500 mg total) by mouth daily.  . metoprolol tartrate (LOPRESSOR) 25 MG tablet Take 0.5 tablets (12.5 mg total) by mouth 2 (two)  times daily.  . nitroGLYCERIN (NITROSTAT) 0.4 MG SL tablet Place 1 tablet (0.4 mg total) under the tongue every 5 (five) minutes x 3 doses as needed for chest pain.  Marland Kitchen omega-3 acid ethyl esters (LOVAZA) 1 g capsule TAKE TWO CAPSULES BY MOUTH TWICE DAILY  . omeprazole (PRILOSEC OTC) 20 MG tablet Take 20 mg by mouth daily as needed (for acid reflux).   Marland Kitchen OVER THE COUNTER MEDICATION Take 2 tablets by mouth daily. Patient takes 2 by mouth daily - Vita - Sprout Capsules - Multi Vitamin   . rifaximin (XIFAXAN) 550 MG TABS tablet Take 1 tablet (550 mg total) by mouth 2 (two) times daily. Reported on 05/17/2015 (Patient taking differently: Take 550 mg by mouth as needed. Reported on 05/17/2015)  . rosuvastatin (CRESTOR) 10 MG tablet Take 1 tablet twice weekly on Monday and Friday (Patient taking differently: Take 10 mg by mouth 2 (two) times a week. Take 1 tablet twice weekly on  Monday and Friday)  . ticagrelor (BRILINTA) 60 MG TABS tablet Take 1 tablet (60 mg total) by mouth 2 (two) times daily.  . traMADol (ULTRAM) 50 MG tablet Take 1 tablet (50 mg total) by mouth every 6 (six) hours as needed.  . Turmeric (CURCUMIN 95) 500 MG CAPS Take 1 capsule by mouth daily.  Marland Kitchen ULORIC 40 MG tablet Take 40 mg by mouth daily.    Allergies  Allergen Reactions  . Metronidazole Itching    Unknown  . Ciprofloxacin Itching and Nausea And Vomiting  . Dexilant [Dexlansoprazole] Diarrhea  . Morphine Itching    Unknown   . Morphine And Related Itching  . Doxycycline Itching, Other (See Comments) and Rash    Redness of skin, burning sensation Unknown    Social History   Tobacco Use  . Smoking status: Former Smoker    Packs/day: 1.00    Years: 25.00    Pack years: 25.00    Types: Cigarettes    Last attempt to quit: 12/22/2001    Years since quitting: 16.0  . Smokeless tobacco: Never Used  Substance Use Topics  . Alcohol use: No    Alcohol/week: 0.0 standard drinks  . Drug use: No   Social History   Social  History Narrative  . Not on file    family history includes Cancer (age of onset: 11) in his father; Coronary artery disease in his mother; Diabetes in his brother and mother.  Wt Readings from Last 3 Encounters:  01/03/18 233 lb (105.7 kg)  10/11/17 239 lb 6.4 oz (108.6 kg)  08/30/17 237 lb 4.8 oz (107.6 kg)    PHYSICAL EXAM BP 126/76   Pulse 80   Ht 6\' 6"  (1.981 m)   Wt 233 lb (105.7 kg)   SpO2 96%   BMI 26.93 kg/m   Physical Exam  Constitutional: He is oriented to person, place, and time. He appears well-developed and well-nourished. No distress.  Healthy-appearing.  Well-groomed.  HENT:  Head: Normocephalic and atraumatic.  Mouth/Throat: Oropharynx is clear and moist.  Neck: Normal range of motion. Neck supple. No hepatojugular reflux and no JVD present. Carotid bruit is not present. No tracheal deviation present. No thyromegaly present.  Cardiovascular: Normal rate, regular rhythm, normal heart sounds and intact distal pulses.  No extrasystoles are present. PMI is not displaced. Exam reveals no gallop and no friction rub.  No murmur heard. Pulmonary/Chest: Effort normal and breath sounds normal. No respiratory distress. He has no wheezes. He has no rales.  Abdominal: Soft. Bowel sounds are normal. He exhibits no distension. There is no tenderness. There is no rebound.  Musculoskeletal: Normal range of motion. He exhibits no edema.  Lymphadenopathy:    He has no cervical adenopathy.  Neurological: He is alert and oriented to person, place, and time.  Psychiatric: He has a normal mood and affect. His behavior is normal. Judgment and thought content normal.  Vitals reviewed.   Adult ECG Report n/a  Other studies Reviewed: Additional studies/ records that were reviewed today include:  Recent Labs: From [facility] Jul 10, 2017: TC 160, TG high at 476.  LDL 35 and HDL 30.  A1c 7.3.  Creatinine 0.8.  Hemoglobin 17.8  ASSESSMENT / PLAN: Problem List Items Addressed This Visit     CAD S/P DES PCI-circumflex - Primary (Chronic)    Essentially only had the circumflex disease with a jailed OM1 after PCI.  Otherwise minimal disease.  No significant symptoms since his PCI.  He  does have some GERD symptoms but they do not seem at all associated with his angina. He is on a low-dose of metoprolol and maintenance dose Brilinta.  On Zetia plus Crestor 10 mg 2 days a week.  Also was switched to [facility] for diabetes.  No symptoms.  No change  Would be okay to hold Brilinta for surgeries or procedures.      Essential hypertension (Chronic)    Pressures look great only tolerating low-dose beta-blocker.  Probably not a true diagnosis      Hyperlipidemia with target LDL less than 70 (Chronic)    Most recent lipid panel showed excellent LDL.  He is on Zetia and Crestor along with Lovaza.  With elevated triglycerides, may need to consider fenofibrate versus change to Vascepa. Labs have been managed by PCP.  Will defer to PCP.  Could be that the high triglycerides are related to his diabetes.      Hypertriglyceridemia (Chronic)    Is on Lovaza, but labs still show elevated triglycerides.  May benefit from either switching to Vascepa versus fenofibrate.      NSTEMI (non-ST elevated myocardial infarction) ([facility]) (Chronic)    No further anginal symptoms.  Anginal equivalent was mostly jaw pain. Preserved EF by echo with no wall motion normalities.  No heart failure or angina symptoms.         Current medicines are reviewed at length with the patient today. (+/- concerns) n/a The following changes have been made: n/a  Patient Instructions  Medication Instructions:  NOT NEEDED If you need a refill on your cardiac medications before your next appointment, please call your pharmacy.   Lab work: NOT NEEDED If you have labs (blood work) drawn today and your tests are completely normal, you will receive your results only by: Marland Kitchen MyChart Message (if you have MyChart) OR . A  paper copy in the mail If you have any lab test that is abnormal or we need to change your treatment, we will call you to review the results.  Testing/Procedures: NOT NEEDED  Follow-Up: At [facility], you and your health needs are our priority.  As part of our continuing mission to provide you with exceptional heart care, we have created designated Provider Care Teams.  These Care Teams include your primary Cardiologist (physician) and Advanced Practice Providers (APPs -  Physician Assistants and Nurse Practitioners) who all work together to provide you with the care you need, when you need it. You will need a follow up appointment in 6 months.  Please call our office 2 months in advance to schedule this appointment.  You may see Glenetta Hew, MD or one of the following Advanced Practice Providers on your designated Care Team:   Rosaria Ferries, PA-C . Jory Sims, DNP, ANP  Any Other Special Instructions Will Be Listed Below (If Applicable).    Studies Ordered:   No orders of the defined types were placed in this encounter.     Glenetta Hew, M.D., M.S. Interventional Cardiologist   Pager # [phone] Phone # [phone] [address] [place], [place] [address]

## 2018-01-03 NOTE — Assessment & Plan Note (Signed)
Most recent lipid panel showed excellent LDL.  He is on Zetia and Crestor along with Lovaza.  With elevated triglycerides, may need to consider fenofibrate versus change to Vascepa. Labs have been managed by PCP.  Will defer to PCP.  Could be that the high triglycerides are related to his diabetes.

## 2018-01-03 NOTE — Patient Instructions (Addendum)
Medication Instructions:  NOT NEEDED If you need a refill on your cardiac medications before your next appointment, please call your pharmacy.   Lab work: NOT NEEDED If you have labs (blood work) drawn today and your tests are completely normal, you will receive your results only by: . MyChart Message (if you have MyChart) OR . A paper copy in the mail If you have any lab test that is abnormal or we need to change your treatment, we will call you to review the results.  Testing/Procedures: NOT NEEDED  Follow-Up: At [facility], you and your health needs are our priority.  As part of our continuing mission to provide you with exceptional heart care, we have created designated Provider Care Teams.  These Care Teams include your primary Cardiologist (physician) and Advanced Practice Providers (APPs -  Physician Assistants and Nurse Practitioners) who all work together to provide you with the care you need, when you need it. You will need a follow up appointment in 6 months.  Please call our office 2 months in advance to schedule this appointment.  You may see David Harding, MD or one of the following Advanced Practice Providers on your designated Care Team:   Rhonda Barrett, PA-C . Kathryn Lawrence, DNP, ANP  Any Other Special Instructions Will Be Listed Below (If Applicable).    

## 2018-01-03 NOTE — Assessment & Plan Note (Signed)
No further anginal symptoms.  Anginal equivalent was mostly jaw pain. Preserved EF by echo with no wall motion normalities.  No heart failure or angina symptoms.

## 2018-01-28 ENCOUNTER — Ambulatory Visit (INDEPENDENT_AMBULATORY_CARE_PROVIDER_SITE_OTHER): Payer: Commercial Managed Care - PPO | Admitting: Internal Medicine

## 2018-02-17 ENCOUNTER — Other Ambulatory Visit: Payer: Self-pay | Admitting: Cardiology

## 2018-02-20 ENCOUNTER — Telehealth (INDEPENDENT_AMBULATORY_CARE_PROVIDER_SITE_OTHER): Payer: Self-pay | Admitting: Internal Medicine

## 2018-02-20 DIAGNOSIS — K91850 Pouchitis: Secondary | ICD-10-CM

## 2018-02-20 MED ORDER — LEVOFLOXACIN 500 MG PO TABS: 500.0000 mg | ORAL_TABLET | Freq: Every day | ORAL | 1 refills | Status: DC

## 2018-02-20 NOTE — Telephone Encounter (Signed)
Abdominal  RLQ. Rx for Levaquin 500mg  daily sent to his pharmacy

## 2018-02-20 NOTE — Telephone Encounter (Signed)
Rx for Levaquin sent to his pharmacy

## 2018-02-20 NOTE — Telephone Encounter (Signed)
Patients wife called stated patient had issues over the holidays - has taken levaquin but still needs to talk with you - ph# [phone]

## 2018-02-21 ENCOUNTER — Telehealth: Payer: Self-pay | Admitting: Internal Medicine

## 2018-02-21 NOTE — Telephone Encounter (Signed)
Hi Dr. Hilarie Fredrickson, this pt is looking to switch his gi care from [place] to [place] because he has not been pleased with the care that he has received. His PCP recommended to see you to continue his gi care. His records from [facility] are in [place]. Could you please review his records and advise if it is ok to schedule patient for a consult with you? Thank you.

## 2018-02-21 NOTE — Telephone Encounter (Signed)
Ok for appt  

## 2018-02-24 ENCOUNTER — Encounter: Payer: Self-pay | Admitting: Internal Medicine

## 2018-02-24 NOTE — Telephone Encounter (Signed)
Pt scheduled for consult on 04/16/18 at 9:30am.

## 2018-04-01 ENCOUNTER — Ambulatory Visit (INDEPENDENT_AMBULATORY_CARE_PROVIDER_SITE_OTHER): Payer: Commercial Managed Care - PPO | Admitting: Internal Medicine

## 2018-04-16 ENCOUNTER — Ambulatory Visit: Payer: Commercial Managed Care - PPO | Admitting: Internal Medicine

## 2018-04-16 ENCOUNTER — Encounter: Payer: Self-pay | Admitting: Internal Medicine

## 2018-04-16 ENCOUNTER — Encounter (INDEPENDENT_AMBULATORY_CARE_PROVIDER_SITE_OTHER): Payer: Self-pay

## 2018-04-16 VITALS — BP 124/70 | HR 82 | Ht 78.0 in | Wt 236.0 lb

## 2018-04-16 DIAGNOSIS — Z8719 Personal history of other diseases of the digestive system: Secondary | ICD-10-CM | POA: Diagnosis not present

## 2018-04-16 DIAGNOSIS — Z9049 Acquired absence of other specified parts of digestive tract: Secondary | ICD-10-CM

## 2018-04-16 DIAGNOSIS — K91850 Pouchitis: Secondary | ICD-10-CM

## 2018-04-16 MED ORDER — VSL#3 DS PO PACK: 1.0000 | PACK | Freq: Every day | ORAL | 3 refills | Status: DC

## 2018-04-16 MED ORDER — LEVOFLOXACIN 500 MG PO TABS: 500.0000 mg | ORAL_TABLET | Freq: Every day | ORAL | 1 refills | Status: AC

## 2018-04-16 MED ORDER — RIFAXIMIN 550 MG PO TABS: 550.0000 mg | ORAL_TABLET | Freq: Two times a day (BID) | ORAL | 1 refills | Status: DC

## 2018-04-16 NOTE — Progress Notes (Signed)
Patient ID: Calvin Cruz, male   DOB: 1960-08-25, 58 y.o.   MRN: [id number] HPI: [name] is a 58 year old male with a past medical history of pan ulcerative colitis with fulminant colitis in 2003 status post total abdominal proctocolectomy with IPAA completed in October 2003 (prolonged hospitalization after initial colectomy in March 2003 with open wound requiring healing by secondary intention), history of pouchitis, CAD with PCI on Brilinta, history of erythrocytosis, hypertension, hyperlipidemia, kidney stones who is seen in consult at the request of Dr. Forde Dandy to establish care.  He is here today with his wife.  We spent time today discussing his prior ulcerative colitis diagnosis and surgical management dating back to 2003.  Since this time he has been treated intermittently for what sounds like partial and near complete bowel obstructive symptoms but also bouts of "enteritis".  He also has had pouchitis.  He has a prescription for Levaquin on hand as well as rifaximin which he will use intermittently for abdominal symptoms which I will discuss below.  In the past he used Flagyl which seemed to help tremendously but he developed an allergy with diffuse and severe itching.  He also has developed an allergy to ciprofloxacin.  He reports intermittent issues with pouchitis symptoms which for him includes tenesmus, diarrhea, lower abdominal discomfort and tightness.  The symptoms seem to respond very well to antibiotics when he has used these previously.  Separate from the pouchitis symptoms he describes that he can develop a right lower abdominal pressure to pain sensation which becomes more severe, bowel movements seem to stop or become less regular and then he will have nausea and vomiting.  Spells of both the right lower quadrant pain and pouchitis tend to be somewhat random.  He thinks they are worsened by stress.  Can have prolonged months without problems.  Seems to happen more frequently in the  wintertime.  Certain foods he knows to avoid such as onions, cabbage.  He does occasionally use tramadol and Zofran for these symptoms.  In the past more potent narcotics have led to constipation and worsened the issues.  Normal bowel movements for him include loose stools 2 diarrheal type stools in the morning, stools formed more in the mid day and then can be looser again at night.  Certainly diet dependent.  Average 6 to 8/day since colectomy.  Uses Lomotil 2 tablets twice daily on an ongoing basis.  Was hospitalized in 2017 which I reviewed.  This was for enteritis treated with antibiotics.  CT did show dilated loops of small bowel in the midabdomen with mild wall thickening "probably" representing enteritis.  He was laid off from work, he worked in Theatre manager, last year.  Past tobacco use.  None now.  No alcohol.  No family history of IBD or GI tract malignancy.  Past Medical History:  Diagnosis Date  . CAD S/P DES PCI-circumflex 07/22/2014   PCI mCx: PCI: 2.75 x 18 mm Xience Alpine DES to mid LCx at takeoff of OM1 (OM1 jailed 50% stenosis)  . Diabetes ([place])   . Erythrocytosis 11/28/2012  . Essential hypertension   . Fatigue 11/28/2012  . Gout 2014  . Hyperlipidemia with target LDL less than 70   . Hypertriglyceridemia 11/16/2014  . Kidney stones   . Non-STEMI (non-ST elevated myocardial infarction) ([place]) 07/22/2014   a) RCA: OK, LM: Okay ; LAD: Proximal to mid 40%; RI: 30%;LCx: Mid to distal 90%, distal 20%, OM1 50% --> DES PCI  . Ulcerative colitis  s/p ilioanal anastomosis (colectomy) ; has had multiple episodes of pouchitis.    Past Surgical History:  Procedure Laterality Date  . APPENDECTOMY    . CARDIAC CATHETERIZATION N/A 07/23/2014   Procedure: Left Heart Cath and Coronary Angiography;  Surgeon: Leonie Man, MD;  Location: [facility];  Service: Cardiovascular;  RCA: Mild disease; LM: Okay ; LAD: Proximal to mid 40%; RI: 30%;LCx: Mid to distal 90%, distal 20%, OM1  50%; Mid inferolateral HK, EF 55-65%   . CARDIAC CATHETERIZATION N/A 07/23/2014   Procedure: Coronary Stent Intervention;  Surgeon: Leonie Man, MD;  Location: [facility];  Service: Cardiovascular; PCI: 2.75 x 18 mm Xience Alpine DES to mid LCx at takeoff of OM1 (OM1 jailed 50% stenosis)  . CHOLECYSTECTOMY    . COLECTOMY    . HERNIA REPAIR     umbilical  . illeoanalanastomosis  04/2001, 11/2001, 03/2202   has a J pouch  . TRANSTHORACIC ECHOCARDIOGRAM  07/22/2014   EF 55-60%, normal wall motion, normal diastolic function,  no valve lesions     Outpatient Medications Prior to Visit  Medication Sig Dispense Refill  . acetaminophen (TYLENOL) 325 MG tablet Take 650 mg by mouth every 6 (six) hours as needed for mild pain.    Marland Kitchen BRILINTA 60 MG TABS tablet TAKE 1 TABLET BY MOUTH TWICE DAILY 60 tablet 5  . Cholecalciferol (HM VITAMIN D3) 4000 units CAPS Take 1 capsule by mouth daily.    . Coenzyme Q10 (COQ10) 100 MG CAPS Take 100 mg by mouth daily. GRADUALLY INCREASE TO 300 MG DAILY (Patient taking differently: Take 100 mg by mouth daily. ) 90 each 11  . colchicine 0.6 MG tablet Take 0.6 mg by mouth daily as needed (for gout flare).     . diclofenac sodium (VOLTAREN) 1 % GEL Apply 2 g topically daily as needed (for pain).     Marland Kitchen diphenoxylate-atropine (LOMOTIL) 2.5-0.025 MG tablet Take 1 tablet by mouth 4 (four) times daily as needed for diarrhea or loose stools. Reported on 09/09/2015 120 tablet 2  . empagliflozin (JARDIANCE) 10 MG TABS tablet Take 10 mg by mouth daily.    Marland Kitchen ezetimibe (ZETIA) 10 MG tablet Take 1 tablet (10 mg total) by mouth daily. 90 tablet 3  . metoprolol tartrate (LOPRESSOR) 25 MG tablet Take 0.5 tablets (12.5 mg total) by mouth 2 (two) times daily. 90 tablet 0  . nitroGLYCERIN (NITROSTAT) 0.4 MG SL tablet Place 1 tablet (0.4 mg total) under the tongue every 5 (five) minutes x 3 doses as needed for chest pain. 25 tablet 3  . omega-3 acid ethyl esters (LOVAZA) 1 g capsule  TAKE TWO CAPSULES BY MOUTH TWICE DAILY 360 capsule 0  . omeprazole (PRILOSEC OTC) 20 MG tablet Take 20 mg by mouth daily as needed (for acid reflux).     Marland Kitchen OVER THE COUNTER MEDICATION Take 2 tablets by mouth daily. Patient takes 2 by mouth daily - Vita - Sprout Capsules - Multi Vitamin     . rosuvastatin (CRESTOR) 10 MG tablet Take 1 tablet twice weekly on Monday and Friday (Patient taking differently: Take 10 mg by mouth 2 (two) times a week. Take 1 tablet twice weekly on Monday and Friday) 60 tablet 0  . traMADol (ULTRAM) 50 MG tablet Take 1 tablet (50 mg total) by mouth every 6 (six) hours as needed. 25 tablet 0  . Turmeric (CURCUMIN 95) 500 MG CAPS Take 1 capsule by mouth daily.    Marland Kitchen  ULORIC 40 MG tablet Take 40 mg by mouth daily.  1  . acidophilus (RISAQUAD) CAPS capsule Take 2 capsules by mouth daily.    Marland Kitchen levofloxacin (LEVAQUIN) 500 MG tablet Take 1 tablet (500 mg total) by mouth daily. (Patient taking differently: Take 500 mg by mouth daily as needed. ) 10 tablet 1  . rifaximin (XIFAXAN) 550 MG TABS tablet Take 1 tablet (550 mg total) by mouth 2 (two) times daily. Reported on 05/17/2015 (Patient taking differently: Take 550 mg by mouth as needed. Reported on 05/17/2015) 28 tablet 1   No facility-administered medications prior to visit.     Allergies  Allergen Reactions  . Metronidazole Itching    Unknown  . Ciprofloxacin Itching and Nausea And Vomiting  . Dexilant [Dexlansoprazole] Diarrhea  . Morphine Itching    Unknown   . Morphine And Related Itching  . Doxycycline Itching, Other (See Comments) and Rash    Redness of skin, burning sensation Unknown     Family History  Problem Relation Age of Onset  . Diabetes Mother   . Coronary artery disease Mother   . Cancer Father 45       lung, smoker  . Diabetes Brother   . Colon cancer Neg Hx   . Esophageal cancer Neg Hx   . Rectal cancer Neg Hx     Social History   Tobacco Use  . Smoking status: Former Smoker     Packs/day: 1.00    Years: 25.00    Pack years: 25.00    Types: Cigarettes    Last attempt to quit: 12/22/2001    Years since quitting: 16.3  . Smokeless tobacco: Never Used  Substance Use Topics  . Alcohol use: No    Alcohol/week: 0.0 standard drinks  . Drug use: No    ROS: As per history of present illness, otherwise negative  BP 124/70   Pulse 82   Ht 6\' 6"  (1.981 m)   Wt 236 lb (107 kg)   BMI 27.27 kg/m  Constitutional: Well-developed and well-nourished. No distress. HEENT: Normocephalic and atraumatic. Oropharynx is clear and moist. Conjunctivae are normal.  No scleral icterus. Neck: Neck supple. Trachea midline. Cardiovascular: Normal rate, regular rhythm and intact distal pulses. No M/R/G Pulmonary/chest: Effort normal and breath sounds normal. No wheezing, rales or rhonchi. Abdominal: Soft, nontender, nondistended. Bowel sounds active throughout. There are no masses palpable. No hepatosplenomegaly. Extremities: no clubbing, cyanosis, or edema Neurological: Alert and oriented to person place and time. Skin: Skin is warm and dry.  Psychiatric: Normal mood and affect. Behavior is normal.  RELEVANT LABS AND IMAGING: CBC    Component Value Date/Time   WBC 6.5 12/12/2016 1218   WBC 12.3 (H) 07/14/2016 1516   RBC 5.23 12/12/2016 1218   RBC 6.29 (H) 07/14/2016 1516   HGB 16.8 12/12/2016 1218   HCT 46.7 12/12/2016 1218   PLT 179 12/12/2016 1218   MCV 89.3 12/12/2016 1218   MCH 32.1 12/12/2016 1218   MCH 32.3 07/14/2016 1516   MCHC 36.0 12/12/2016 1218   MCHC 36.8 (H) 07/14/2016 1516   RDW 12.3 12/12/2016 1218   LYMPHSABS 2.7 12/12/2016 1218   MONOABS 0.6 12/12/2016 1218   EOSABS 0.1 12/12/2016 1218   BASOSABS 0.0 12/12/2016 1218    CMP     Component Value Date/Time   NA 139 09/10/2016 1141   K 4.0 09/10/2016 1141   CL 97 (L) 07/14/2016 1516   CO2 22 09/10/2016 1141   GLUCOSE 156 (H)  09/10/2016 1141   BUN 9.5 09/10/2016 1141   CREATININE 0.8 09/10/2016  1141   CALCIUM 9.8 09/10/2016 1141   PROT 7.4 09/10/2016 1141   ALBUMIN 4.3 09/10/2016 1141   AST 27 09/10/2016 1141   ALT 34 09/10/2016 1141   ALKPHOS 57 09/10/2016 1141   BILITOT 0.97 09/10/2016 1141   GFRNONAA >60 07/14/2016 1516   GFRAA >60 07/14/2016 1516   CT ABDOMEN AND PELVIS WITH CONTRAST   TECHNIQUE: Multidetector CT imaging of the abdomen and pelvis was performed using the standard protocol following bolus administration of intravenous contrast.   CONTRAST:  38mL ISOVUE-300 IOPAMIDOL (ISOVUE-300) INJECTION 61%, 113mL ISOVUE-300 IOPAMIDOL (ISOVUE-300) INJECTION 61%   COMPARISON:  04/19/2015 CT of the abdomen and pelvis.   FINDINGS: Lower chest: No acute abnormality.  Coronary artery calcification.   Hepatobiliary: No focal liver abnormality is seen. Status post cholecystectomy. No biliary dilatation.   Pancreas: Unremarkable. No pancreatic ductal dilatation or surrounding inflammatory changes.   Spleen: Normal in size without focal abnormality.   Adrenals/Urinary Tract: Adrenal glands are unremarkable. Kidneys are normal, without renal calculi, focal lesion, or hydronephrosis. Bladder is unremarkable. Retroaortic left renal vein.   Stomach/Bowel: Status post colectomy. Patent pelvic small bowel anastomosis. No evidence for small bowel obstruction. Mild wall thickening of small bowel loops in the mid abdomen which are mildly dilated, smoothly tapering in caliber proximally and distally.   Vascular/Lymphatic: Aortic atherosclerosis. No enlarged abdominal or pelvic lymph nodes.   Reproductive: Prostatic calcifications.   Other: Stable small right sided abdominal wall hernia traversing the rectus muscle level of umbilicus, likely prior colostomy site, containing fat.   Musculoskeletal: No acute osseous abnormality. Mild spondylosis of the visible thoracic and lumbar spine and mild bilateral hip osteoarthrosis. Multiple levels of Schmorl's nodes are  stable.   IMPRESSION: 1. Mildly dilated loops of small bowel in the mid abdomen with mild wall thickening probably representing enteritis. No evidence for obstruction. 2. Stable chronic findings as above.     Electronically Signed   By: Kristine Garbe M.D.   On: 04/10/2016 22:55     CT ABDOMEN AND PELVIS WITH CONTRAST   TECHNIQUE: Multidetector CT imaging of the abdomen and pelvis was performed using the standard protocol following bolus administration of intravenous contrast.   CONTRAST:  117mL ISOVUE-300 IOPAMIDOL (ISOVUE-300) INJECTION 61%   COMPARISON:  CT scan of April 10, 2016.   FINDINGS: Lower chest: No acute abnormality.   Hepatobiliary: No focal liver abnormality is seen. Status post cholecystectomy. No biliary dilatation.   Pancreas: Unremarkable. No pancreatic ductal dilatation or surrounding inflammatory changes.   Spleen: Normal in size without focal abnormality.   Adrenals/Urinary Tract: Adrenal glands are unremarkable. Kidneys are normal, without renal calculi, focal lesion, or hydronephrosis. Bladder is unremarkable.   Stomach/Bowel: Status post colectomy. There is no evidence of bowel obstruction or inflammation. Stable surgical anastomosis is noted in the pelvis.   Vascular/Lymphatic: Aortic atherosclerosis. No enlarged abdominal or pelvic lymph nodes.   Reproductive: Prostate is unremarkable.   Other: Stable small ventral hernia is seen in right lower quadrant of the abdomen. No abnormal fluid collection is noted.   Musculoskeletal: No acute or significant osseous findings.   IMPRESSION: Status post colectomy. No evidence of bowel obstruction or active inflammation.   Aortic atherosclerosis.   Stable small fat containing ventral hernia seen in right lower quadrant of the abdomen.     Electronically Signed   By: Marijo Conception, M.D.   On: 08/08/2016 10:06  ASSESSMENT/PLAN: 58 year old male with a past medical  history of pan ulcerative colitis with fulminant colitis in 2003 status post total abdominal proctocolectomy with IPAA completed in October 2003 (prolonged hospitalization after initial colectomy in March 2003 with open wound requiring healing by secondary intention), history of pouchitis, CAD with PCI on Brilinta, history of erythrocytosis, hypertension, hyperlipidemia, kidney stones who is seen in consult at the request of Dr. Forde Dandy to establish care.   1. Hx of UC s/p total proctocolectomy with IPAA/history of pouchitis/intermittent bowel obstructive symptoms --we had a long discussion today regarding his overall constellation of symptoms.  Certainly some of his symptoms are consistent with pouchitis and when this flares I would expect this to be antibiotic responsive.  We can try Levaquin in the future or rifaximin.  May also benefit from tinidazole though would have to monitor for allergy given intolerance/allergy to Flagyl.  No active symptoms of pouchitis at present.  Other symptoms are more consistent with bowel obstruction which would likely be secondary to adhesive disease given abdominal surgeries.  That said, if this becomes more frequent I would recommend that we consider reimaging or endoscopy to evaluate and exclude Crohn's disease.  No history of Crohn's disease though I explained that occasionally patients felt to have ulcerative colitis wind up with small bowel inflammation after colectomy and thus Crohn's disease.  That said, I still think symptoms are likely adhesive disease over IBD.  When this occurs he is recommended to notify me, go to an all liquid diet.  Can use tramadol and Zofran as needed and as directed for nausea and pain.  We discussed VSL 3 1 double strength packet daily and he will price to see if this is affordable.  If he cannot afford the higher dose that he can continue his current probiotic which she is found helpful.  Continue Lomotil 2 tablets twice  daily.  Follow-up in about 3 months, sooner if needed      JO:ACZYS, Annie [address], [place] [place], [place] [id number]

## 2018-04-16 NOTE — Patient Instructions (Signed)
We have sent the following medications to your pharmacy for you to pick up at your convenience: Xifaxan 550 mg twice daily x 10 days as needed for pouchitis Levaquin 500 mg once daily x 10 days as needed for pouchitis  We have sent the following medications to your pharmacy for you to pick up at your convenience: VSL #3 DS- 1 packet daily ([facility])  Please follow up with Dr Hilarie Fredrickson in 3-4 months in office.  If you are age 60 or older, your body mass index should be between 23-30. Your Body mass index is 27.27 kg/m. If this is out of the aforementioned range listed, please consider follow up with your Primary Care Provider.  If you are age 58 or younger, your body mass index should be between 19-25. Your Body mass index is 27.27 kg/m. If this is out of the aformentioned range listed, please consider follow up with your Primary Care Provider.

## 2018-04-30 DIAGNOSIS — E7849 Other hyperlipidemia: Secondary | ICD-10-CM | POA: Diagnosis not present

## 2018-04-30 DIAGNOSIS — K5190 Ulcerative colitis, unspecified, without complications: Secondary | ICD-10-CM | POA: Diagnosis not present

## 2018-04-30 DIAGNOSIS — E1151 Type 2 diabetes mellitus with diabetic peripheral angiopathy without gangrene: Secondary | ICD-10-CM | POA: Diagnosis not present

## 2018-06-03 ENCOUNTER — Ambulatory Visit (INDEPENDENT_AMBULATORY_CARE_PROVIDER_SITE_OTHER): Payer: Commercial Managed Care - PPO | Admitting: Internal Medicine

## 2018-08-17 ENCOUNTER — Other Ambulatory Visit: Payer: Self-pay | Admitting: Cardiology

## 2018-08-18 MED ORDER — TICAGRELOR 60 MG PO TABS: 60.0000 mg | ORAL_TABLET | Freq: Two times a day (BID) | ORAL | 1 refills | Status: DC

## 2018-08-18 NOTE — Telephone Encounter (Signed)
Rx(s) sent to pharmacy electronically.  

## 2018-08-20 ENCOUNTER — Other Ambulatory Visit: Payer: Self-pay | Admitting: Cardiology

## 2018-09-23 ENCOUNTER — Encounter: Payer: Self-pay | Admitting: Nutrition

## 2018-10-29 ENCOUNTER — Ambulatory Visit: Payer: Commercial Managed Care - PPO | Admitting: Cardiology

## 2019-01-02 ENCOUNTER — Ambulatory Visit: Payer: Commercial Managed Care - PPO | Admitting: Cardiology

## 2019-01-20 ENCOUNTER — Ambulatory Visit: Payer: Commercial Managed Care - PPO | Admitting: Internal Medicine

## 2019-01-20 ENCOUNTER — Encounter: Payer: Self-pay | Admitting: Internal Medicine

## 2019-01-20 VITALS — BP 118/62 | HR 84 | Temp 97.4°F | Ht 78.0 in | Wt 235.0 lb

## 2019-01-20 DIAGNOSIS — Z8719 Personal history of other diseases of the digestive system: Secondary | ICD-10-CM | POA: Diagnosis not present

## 2019-01-20 DIAGNOSIS — Z9049 Acquired absence of other specified parts of digestive tract: Secondary | ICD-10-CM | POA: Diagnosis not present

## 2019-01-20 NOTE — Progress Notes (Signed)
   Subjective:    Patient ID: Calvin Cruz, male    DOB: [birth year], 58 y.o.   MRN: [id number]  HPI Calvin Cruz is a 58 year old male with a history of pan ulcerative colitis with fulminant colitis in 2003 status post total abdominal proctocolectomy with IPAA in October 03, history of pouchitis, CAD with PCI on Brilinta, history of erythrocytosis, hypertension, hyperlipidemia and kidney stones who is here for follow-up.  He is here today with his wife.  He was last seen on 04/16/2018.  He has been doing very well this year and has established a balance and improved bowel habit with probiotic.  He is taking a combination of 2 probiotics 1 is called Natural Factor 12/12 which he takes every other day and the other is called Sallee Lange which he takes daily.  With this he is having 3-4 bowel movements per day.  Bowel frequency depends on timing of diet and dietary intake.  He has learned that raw vegetables particularly cabbage and onions worsen his bowel symptoms and can make him have lower abdominal discomfort.  Occasionally foods like refried beans make him feel the need to go to the bathroom but incomplete evacuation.  He has not needed antibiotics and has had no obstructive symptoms which he had had in years past.  No upper GI or hepatobiliary complaint.  He is having rare heartburn and will rarely use omeprazole.  He averages using omeprazole once a month.  VSL #3 was cost prohibitive.   Review of Systems As per HPI, otherwise negative  Current Medications, Allergies, Past Medical History, Past Surgical History, Family History and Social History were reviewed in [employer] record.     Objective:   Physical Exam BP 118/62   Pulse 84   Temp (!) 97.4 F (36.3 C) (Temporal)   Ht 6\' 6"  (1.981 m)   Wt 235 lb (106.6 kg)   BMI 27.16 kg/m  Gen: awake, alert, NAD HEENT: anicteric, op clear CV: RRR, no mrg Pulm: CTA b/l Abd: soft, NT/ND, +BS throughout, small middle abdomen  just left of midline near incision hernia which is nontender Ext: no c/c/e Neuro: nonfocal     Assessment & Plan:   59 year old male with a history of pan ulcerative colitis with fulminant colitis in 2003 status post total abdominal proctocolectomy with IPAA in October 03, history of pouchitis, CAD with PCI on Brilinta, history of erythrocytosis, hypertension, hyperlipidemia and kidney stones who is here for follow-up.  1.  History of ulcerative colitis status post total proctocolectomy with IPAA/history of pouchitis/intermittent bowel obstruction symptom --this year he is doing great having settled on a combination of probiotic.  He has had no recent abdominal pain or obstructive symptoms.  He has altered his diet to avoid certain fresh vegetables which were problematic for him.  He is also not needed antibiotic therapy (levofloxacin/rifaximin) this year.  No active symptoms of pouchitis. --Continue current probiotic regimen --As levofloxacin and rifaximin on hand should he develop pouchitis symptoms.  If this occurs I would recommend pouchoscopy. --Can use Lomotil, Zofran as needed  Annual follow-up, sooner if needed  15 minutes spent with the patient today. Greater than 50% was spent in counseling and coordination of care with the patient

## 2019-01-20 NOTE — Patient Instructions (Addendum)
  If you are age 60 or younger, your body mass index should be between 19-25. Your Body mass index is 27.16 kg/m. If this is out of the aformentioned range listed, please consider follow up with your Primary Care Provider.   Continue your present medications.  We would like to see you back in 1 year.  We will send you a reminder when it is time to schedule your appointment.   Thank you for entrusting me with your care and for choosing  [facility], Dr. Zenovia Jarred

## 2019-02-03 ENCOUNTER — Telehealth: Payer: Self-pay | Admitting: Cardiology

## 2019-02-03 NOTE — Telephone Encounter (Signed)
Patient's wife, Joelene Millin, states she will be coming with her husband to his appt with Dr. Ellyn Hack on 12/17. She is his advocate.

## 2019-02-05 ENCOUNTER — Ambulatory Visit: Payer: Commercial Managed Care - PPO | Admitting: Cardiology

## 2019-02-05 ENCOUNTER — Other Ambulatory Visit: Payer: Self-pay

## 2019-02-05 ENCOUNTER — Encounter: Payer: Self-pay | Admitting: Cardiology

## 2019-02-05 VITALS — BP 138/82 | HR 76 | Temp 98.1°F | Ht 78.0 in | Wt 234.0 lb

## 2019-02-05 DIAGNOSIS — E781 Pure hyperglyceridemia: Secondary | ICD-10-CM

## 2019-02-05 DIAGNOSIS — Z9861 Coronary angioplasty status: Secondary | ICD-10-CM

## 2019-02-05 DIAGNOSIS — I2510 Atherosclerotic heart disease of native coronary artery without angina pectoris: Secondary | ICD-10-CM

## 2019-02-05 DIAGNOSIS — I214 Non-ST elevation (NSTEMI) myocardial infarction: Secondary | ICD-10-CM

## 2019-02-05 DIAGNOSIS — E785 Hyperlipidemia, unspecified: Secondary | ICD-10-CM

## 2019-02-05 DIAGNOSIS — I10 Essential (primary) hypertension: Secondary | ICD-10-CM

## 2019-02-05 NOTE — Progress Notes (Signed)
Primary Care Provider: Reynold Bowen, MD Cardiologist: Glenetta Hew, MD Electrophysiologist:   Clinic Note: Chief Complaint  Patient presents with  . Follow-up    6 months.  . Coronary Artery Disease    HPI:    Calvin Cruz is a 58 y.o. male with a PMH below who presents today for annual follow-up for CAD (non-STEMI with LCx PCI in 2016). He also has underlying ulcerative colitis (status post ileal and anal anastomosis and when J-pouch with recurrent pouchitis).Gala Romney was last seen in November 2019.  Doing well.  Having short little spells of 15 to 20 minutes of burning sensation in his right upper chest.  Very different from his angina.  Recent Hospitalizations: None  Reviewed  CV studies:    The following studies were reviewed today: (if available, images/films reviewed: From Epic Chart or Care Everywhere) . None:  Interval History:   Calvin Cruz returns here for annual follow-up doing well without any major complaints.  No further anginal symptoms.  He is having some epigastric gas pains off and on.  He sees Dr. Hilarie Fredrickson.  He is doing well.  Not able do any routine exercise, because he works 13-hour days but is always on his feet all day and remains activev--being on his feet all day long he does have some mild end of day swelling but nothing significant.    No angina or heart failure symptoms.  CV Review of Symptoms (Summary): no chest pain or dyspnea on exertion positive for - Epigastric pain with some occasional skipped beats nothing prolonged. negative for - edema, orthopnea, paroxysmal nocturnal dyspnea, rapid heart rate, shortness of breath or Syncope/near syncope, TIA/amaurosis fugax, claudication.  The patient does not have symptoms concerning for COVID-19 infection (fever, chills, cough, or new shortness of breath).  The patient is practicing social distancing. ++ Masking.  He is careful when he goes out for groceries/shopping.  Systems set at  work for him to be safe with sports social distancing, masking and hand sanitation at work.  Has been working throughout the entire Covid timeframe.    REVIEWED OF SYSTEMS   A comprehensive ROS was performed. No recurrent pouchitis symptoms.  Doing well from a GI standpoint. Review of Systems  Constitutional: Negative for malaise/fatigue and weight loss.  HENT: Negative for congestion and nosebleeds.   Respiratory: Negative for shortness of breath and wheezing.   Gastrointestinal: Positive for abdominal pain (Epigastric pain) and heartburn (Bloating gas).  Genitourinary: Negative for flank pain and hematuria.  Musculoskeletal: Negative for falls and joint pain.  Neurological: Negative for dizziness.       Just has some mild balance issues.  Psychiatric/Behavioral: Negative.   All other systems reviewed and are negative.  I have reviewed and (if needed) personally updated the patient's problem list, medications, allergies, past medical and surgical history, social and family history.   PAST MEDICAL HISTORY   Past Medical History:  Diagnosis Date  . CAD S/P DES PCI-circumflex 07/22/2014   PCI mCx: PCI: 2.75 x 18 mm Xience Alpine DES to mid LCx at takeoff of OM1 (OM1 jailed 50% stenosis)  . Diabetes ([place])   . Erythrocytosis 11/28/2012  . Essential hypertension   . Fatigue 11/28/2012  . Gout 2014  . Hyperlipidemia with target LDL less than 70   . Hypertriglyceridemia 11/16/2014  . Kidney stones   . Non-STEMI (non-ST elevated myocardial infarction) ([place]) 07/22/2014   a) RCA: OK, LM: Okay ; LAD: Proximal to  mid 40%; RI: 30%;LCx: Mid to distal 90%, distal 20%, OM1 50% --> DES PCI  . Ulcerative colitis    s/p ilioanal anastomosis (colectomy) ; has had multiple episodes of pouchitis.     PAST SURGICAL HISTORY   Past Surgical History:  Procedure Laterality Date  . APPENDECTOMY    . CARDIAC CATHETERIZATION N/A 07/23/2014   Procedure: Left Heart Cath and Coronary Angiography;   Surgeon: Leonie Man, MD;  Location: [facility];  Service: Cardiovascular;  RCA: Mild disease; LM: Okay ; LAD: Proximal to mid 40%; RI: 30%;LCx: Mid to distal 90%, distal 20%, OM1 50%; Mid inferolateral HK, EF 55-65%   . CARDIAC CATHETERIZATION N/A 07/23/2014   Procedure: Coronary Stent Intervention;  Surgeon: Leonie Man, MD;  Location: [facility];  Service: Cardiovascular; PCI: 2.75 x 18 mm Xience Alpine DES to mid LCx at takeoff of OM1 (OM1 jailed 50% stenosis)  . CHOLECYSTECTOMY    . COLECTOMY    . HERNIA REPAIR     umbilical  . illeoanalanastomosis  04/2001, 11/2001, 03/2202   has a J pouch  . TRANSTHORACIC ECHOCARDIOGRAM  07/22/2014   EF 55-60%, normal wall motion, normal diastolic function,  no valve lesions      MEDICATIONS/ALLERGIES   Current Meds  Medication Sig  . acetaminophen (TYLENOL) 325 MG tablet Take 650 mg by mouth every 6 (six) hours as needed for mild pain.  Marland Kitchen BRILINTA 60 MG TABS tablet Take 1 tablet by mouth twice daily  . Cholecalciferol (HM VITAMIN D3) 4000 units CAPS Take 1 capsule by mouth daily.  . Coenzyme Q10 (COQ10) 100 MG CAPS Take 100 mg by mouth daily. GRADUALLY INCREASE TO 300 MG DAILY (Patient taking differently: Take 100 mg by mouth daily. )  . colchicine 0.6 MG tablet Take 0.6 mg by mouth daily as needed (for gout flare).   . diclofenac sodium (VOLTAREN) 1 % GEL Apply 2 g topically daily as needed (for pain).   Marland Kitchen diphenoxylate-atropine (LOMOTIL) 2.5-0.025 MG tablet Take 1 tablet by mouth 4 (four) times daily as needed for diarrhea or loose stools. Reported on 09/09/2015  . empagliflozin (JARDIANCE) 10 MG TABS tablet Take 10 mg by mouth daily.  Marland Kitchen ezetimibe (ZETIA) 10 MG tablet Take 1 tablet (10 mg total) by mouth daily.  . Lactobacillus (PROBIOTIC ACIDOPHILUS) CAPS Take 2 capsules by mouth daily.  . Lactobacillus (ULTIMATE PROBIOTIC FORMULA) CAPS Take 1 capsule by mouth every other day.  . metoprolol tartrate (LOPRESSOR) 25 MG tablet  Take 0.5 tablets (12.5 mg total) by mouth 2 (two) times daily.  . nitroGLYCERIN (NITROSTAT) 0.4 MG SL tablet Place 1 tablet (0.4 mg total) under the tongue every 5 (five) minutes x 3 doses as needed for chest pain.  Marland Kitchen omega-3 acid ethyl esters (LOVAZA) 1 g capsule TAKE TWO CAPSULES BY MOUTH TWICE DAILY  . omeprazole (PRILOSEC OTC) 20 MG tablet Take 20 mg by mouth daily as needed (for acid reflux).   Marland Kitchen OVER THE COUNTER MEDICATION Take 2 tablets by mouth daily. Patient takes 2 by mouth daily - Vita - Sprout Capsules - Multi Vitamin   . rifaximin (XIFAXAN) 550 MG TABS tablet Take 1 tablet (550 mg total) by mouth 2 (two) times daily. Reported on 05/17/2015  . rosuvastatin (CRESTOR) 10 MG tablet Take 1 tablet twice weekly on Monday and Friday (Patient taking differently: Take 10 mg by mouth 2 (two) times a week. Take 1 tablet twice weekly on Monday and Friday)  .  traMADol (ULTRAM) 50 MG tablet Take 1 tablet (50 mg total) by mouth every 6 (six) hours as needed.  . Turmeric (CURCUMIN 95) 500 MG CAPS Take 1 capsule by mouth daily.  Marland Kitchen ULORIC 40 MG tablet Take 40 mg by mouth daily.    Allergies  Allergen Reactions  . Metronidazole Itching    Unknown  . Ciprofloxacin Itching and Nausea And Vomiting  . Dexilant [Dexlansoprazole] Diarrhea  . Morphine Itching    Unknown   . Morphine And Related Itching  . Doxycycline Itching, Other (See Comments) and Rash    Redness of skin, burning sensation Unknown      SOCIAL HISTORY/FAMILY HISTORY   Social History   Tobacco Use  . Smoking status: Former Smoker    Packs/day: 1.00    Years: 25.00    Pack years: 25.00    Types: Cigarettes    Quit date: 12/22/2001    Years since quitting: 17.1  . Smokeless tobacco: Never Used  Substance Use Topics  . Alcohol use: No    Alcohol/week: 0.0 standard drinks  . Drug use: No   Social History   Social History Narrative   One Son and 2 adopted grandkids    Family History family history includes Cancer  (age of onset: 66) in his father; Coronary artery disease in his mother; Diabetes in his brother and mother.   OBJCTIVE -PE, EKG, labs   Wt Readings from Last 3 Encounters:  02/05/19 234 lb (106.1 kg)  01/20/19 235 lb (106.6 kg)  04/16/18 236 lb (107 kg)    Physical Exam: BP 138/82 (BP Location: Right Arm, Patient Position: Sitting, Cuff Size: Normal)   Pulse 76   Temp 98.1 F (36.7 C)   Ht 6\' 6"  (1.981 m)   Wt 234 lb (106.1 kg)   BMI 27.04 kg/m  Physical Exam  Constitutional: He is oriented to person, place, and time. He appears well-developed and well-nourished. No distress.  Healthy-appearing.  Well-groomed.  HENT:  Head: Normocephalic and atraumatic.  Neck: No JVD present.  Cardiovascular: Normal rate, regular rhythm, S1 normal, S2 normal, normal heart sounds and intact distal pulses.  Occasional extrasystoles are present. PMI is not displaced. Exam reveals no gallop and no friction rub.  No murmur heard. Pulmonary/Chest: Effort normal and breath sounds normal. No respiratory distress. He has no wheezes. He has no rales.  Abdominal: Soft. Bowel sounds are normal. He exhibits no distension. There is no abdominal tenderness. There is no rebound.  Musculoskeletal:        General: No edema. Normal range of motion.     Cervical back: Normal range of motion and neck supple.  Neurological: He is alert and oriented to person, place, and time.  Skin: Skin is warm and dry.  Psychiatric: He has a normal mood and affect. His behavior is normal. Judgment and thought content normal.  Vitals reviewed.   Adult ECG Report  Rate: 76 ;  Rhythm: normal sinus rhythm and Normal axis, intervals, durations.;   Narrative Interpretation: Normal EKG  Recent Labs: Labs are followed by PCP. --> 07/08/2008: TC 160, HDL 30, LDL 35, TG 476.  A1c as of March 20 26.8. Lab Results  Component Value Date   CHOL 181 12/12/2015   HDL 30 (L) 12/12/2015   LDLCALC NOT CALC 12/12/2015   LDLDIRECT 93  12/12/2015   TRIG 419 (H) 12/12/2015   CHOLHDL 6.0 (H) 12/12/2015   Lab Results  Component Value Date   CREATININE 0.8 09/10/2016  BUN 9.5 09/10/2016   NA 139 09/10/2016   K 4.0 09/10/2016   CL 97 (L) 07/14/2016   CO2 22 09/10/2016    ASSESSMENT/PLAN    Problem List Items Addressed This Visit    NSTEMI (non-ST elevated myocardial infarction) ([place]) (Chronic)    Now more than 4 years out.  No further anginal symptoms.  Mostly feels jaw pain at the time of his MI. Echo showed preserved EF.  No angina or heart failure symptoms.        Relevant Orders   EKG 12-Lead   CAD S/P DES PCI-circumflex - Primary (Chronic)    Status post PCI to the LCx with GI tomorrow morning.  No further anginal symptoms.  Is on maintenance dose Brilinta--can hold for procedures 5-7 days preop.   Combination statin and Zetia.  On Lopressor.  On Jardiance for diabetes as well as cardiovascular protection.      Relevant Orders   EKG 12-Lead   Hepatic function panel   Lipid panel   Hyperlipidemia with target LDL less than 70 (Chronic)    Pretty well controlled as of last May.  Should be having labs checked soon.  He is on rosuvastatin and low-dose atorvastatin and Lovaza along with Zetia.  Labs being followed by PCP.  Was at goal.      Relevant Orders   Hepatic function panel   Lipid panel   Essential hypertension (Chronic)    Blood pressure is borderline but 201 moderate risk with blood pressure      Hypertriglyceridemia (Chronic)    Still has elevated triglycerides however.  Was started on Lovaza.  If not at goal, would consider switching to Vascepa.      Relevant Orders   Hepatic function panel   Lipid panel       COVID-19 Education: The signs and symptoms of COVID-19 were discussed with the patient and how to seek care for testing (follow up with PCP or arrange E-visit).   The importance of social distancing was discussed today.  I spent a total of 66minutes with the patient  and chart review. >  50% of the time was spent in direct patient consultation.  Additional time spent with chart review (studies, outside notes, etc):6 Total Time: 22 min   Current medicines are reviewed at length with the patient today.  (+/- concerns) n/a   Patient Instructions / Medication Changes & Studies & Tests Ordered   Patient Instructions  Medication Instructions:  NO CHANGES  *If you need a refill on your cardiac medications before your next appointment, please call your pharmacy*  Lab Work: FASTING - LIPID  HEPATIC PANEL  If you have labs (blood work) drawn today and your tests are completely normal, you will receive your results only by: Marland Kitchen MyChart Message (if you have MyChart) OR . A paper copy in the mail If you have any lab test that is abnormal or we need to change your treatment, we will call you to review the results.  Testing/Procedures: NOT NEEDED  Follow-Up: At [facility], you and your health needs are our priority.  As part of our continuing mission to provide you with exceptional heart care, we have created designated Provider Care Teams.  These Care Teams include your primary Cardiologist (physician) and Advanced Practice Providers (APPs -  Physician Assistants and Nurse Practitioners) who all work together to provide you with the care you need, when you need it.  Your next appointment:   12 month(s)  The  format for your next appointment:   In Person  Provider:   Glenetta Hew, MD  Other Instructions     Studies Ordered:   Orders Placed This Encounter  Procedures  . Hepatic function panel  . Lipid panel  . EKG 12-Lead     Glenetta Hew, M.D., M.S. Interventional Cardiologist   Pager # [phone] Phone # [phone] [address] [place], [place] [address]   Thank you for choosing Heartcare at [facility]!!

## 2019-02-05 NOTE — Patient Instructions (Signed)
Medication Instructions:  NO CHANGES  *If you need a refill on your cardiac medications before your next appointment, please call your pharmacy*  Lab Work: FASTING - LIPID  HEPATIC PANEL  If you have labs (blood work) drawn today and your tests are completely normal, you will receive your results only by: Marland Kitchen MyChart Message (if you have MyChart) OR . A paper copy in the mail If you have any lab test that is abnormal or we need to change your treatment, we will call you to review the results.  Testing/Procedures: NOT NEEDED  Follow-Up: At [facility], you and your health needs are our priority.  As part of our continuing mission to provide you with exceptional heart care, we have created designated Provider Care Teams.  These Care Teams include your primary Cardiologist (physician) and Advanced Practice Providers (APPs -  Physician Assistants and Nurse Practitioners) who all work together to provide you with the care you need, when you need it.  Your next appointment:   12 month(s)  The format for your next appointment:   In Person  Provider:   Glenetta Hew, MD  Other Instructions

## 2019-02-05 NOTE — Telephone Encounter (Signed)
Zigmund Daniel for wife to come to appt.

## 2019-02-08 ENCOUNTER — Encounter: Payer: Self-pay | Admitting: Cardiology

## 2019-02-08 NOTE — Assessment & Plan Note (Signed)
Status post PCI to the LCx with GI tomorrow morning.  No further anginal symptoms.  Is on maintenance dose Brilinta--can hold for procedures 5-7 days preop.   Combination statin and Zetia.  On Lopressor.  On Jardiance for diabetes as well as cardiovascular protection.

## 2019-02-08 NOTE — Assessment & Plan Note (Signed)
Now more than 4 years out.  No further anginal symptoms.  Mostly feels jaw pain at the time of his MI. Echo showed preserved EF.  No angina or heart failure symptoms.

## 2019-02-08 NOTE — Assessment & Plan Note (Signed)
Still has elevated triglycerides however.  Was started on Lovaza.  If not at goal, would consider switching to Vascepa.

## 2019-02-08 NOTE — Assessment & Plan Note (Signed)
Pretty well controlled as of last May.  Should be having labs checked soon.  He is on rosuvastatin and low-dose atorvastatin and Lovaza along with Zetia.  Labs being followed by PCP.  Was at goal.

## 2019-02-08 NOTE — Assessment & Plan Note (Signed)
Blood pressure is borderline but 201 moderate risk with blood pressure

## 2019-02-10 LAB — LIPID PANEL
Chol/HDL Ratio: 4.4 ratio (ref 0.0–5.0)
Cholesterol, Total: 159 mg/dL (ref 100–199)
HDL: 36 mg/dL — ABNORMAL LOW (ref >39)
LDL Chol Calc (NIH): 65 mg/dL (ref 0–99)
Triglycerides: 374 mg/dL — ABNORMAL HIGH (ref 0–149)
VLDL Cholesterol Cal: 58 mg/dL — ABNORMAL HIGH (ref 5–40)

## 2019-02-10 LAB — HEPATIC FUNCTION PANEL
ALT: 31 IU/L (ref 0–44)
AST: 28 IU/L (ref 0–40)
Albumin: 5.2 g/dL — ABNORMAL HIGH (ref 3.8–4.9)
Alkaline Phosphatase: 71 IU/L (ref 39–117)
Bilirubin Total: 1.1 mg/dL (ref 0.0–1.2)
Bilirubin, Direct: 0.25 mg/dL (ref 0.00–0.40)
Total Protein: 7.4 g/dL (ref 6.0–8.5)

## 2019-02-11 NOTE — Progress Notes (Signed)
Normal liver function test.  Cholesterol level shows normal total cholesterol. Relatively good LDL and HDL, but triglycerides remain elevated at 374.  Would like to switch from Lovaza to [place] after he completes current prescription.  New Rx: Vascepa 1 g capsule, take 2 capsules p.o. twice daily; Disp 360 Cap; 3 refills.   Should probably have fasting lipid panel checked again in about 3 to 4 months after making the change.  Glenetta Hew, MD

## 2019-02-12 ENCOUNTER — Telehealth: Payer: Self-pay | Admitting: *Deleted

## 2019-02-12 DIAGNOSIS — E785 Hyperlipidemia, unspecified: Secondary | ICD-10-CM

## 2019-02-12 MED ORDER — ICOSAPENT ETHYL 1 G PO CAPS: 2.0000 g | ORAL_CAPSULE | Freq: Two times a day (BID) | ORAL | 11 refills | Status: DC

## 2019-02-12 NOTE — Telephone Encounter (Signed)
Complete lovaza  Then change to Vascepa Medication e-sent to pharmacy    there is a saving card , labslip will be mailed

## 2019-02-12 NOTE — Telephone Encounter (Signed)
-----   Message from Leonie Man, MD sent at 02/11/2019 10:52 PM EST ----- Normal liver function test.  Cholesterol level shows normal total cholesterol.  Relatively good LDL and HDL, but triglycerides remain elevated at 374.  Would like to switch from Lovaza to [place] after he completes current prescription.  New Rx: Vascepa 1 g capsule, take 2 capsules p.o. twice daily; Disp 360 Cap; 3 refills.   Should probably have fasting lipid panel checked again in about 3 to 4 months after making the change.  Glenetta Hew, MD

## 2019-02-24 ENCOUNTER — Telehealth: Payer: Self-pay | Admitting: Cardiology

## 2019-02-24 NOTE — Telephone Encounter (Signed)
Pt c/o medication issue:  1. Name of Medication: icosapent Ethyl (VASCEPA) 1 g capsule  2. How are you currently taking this medication (dosage and times per day)? N/A  3. Are you having a reaction (difficulty breathing--STAT)? no  4. What is your medication issue? Patient's wife states that insurance does not cover this medication. She would like to speak with Dr. Allison Quarry nurse to see if there is an alternative medication or what Dr. Allison Quarry advice would be.

## 2019-02-27 ENCOUNTER — Telehealth: Payer: Self-pay | Admitting: Cardiology

## 2019-02-27 NOTE — Telephone Encounter (Signed)
Spoke with pt's wife and pt did not tolerate Tricor and pt's wife mentioned pt had taken lovaza in the past and this  helped significantly but had to stop due to insurance at the time no longer covering and was going to be $150.00 out of pocket Pt's wife will check and see what cost will be of the Lovaza and will call back ./cy

## 2019-02-27 NOTE — Telephone Encounter (Signed)
New Message  Pt's wife is calling to let Dr. Ellyn Hack and his nurse know that the insurance [facility] care) will approve the medication and that they have faxed over the form and need Dr. Ellyn Hack to sign off on prior authorization form   Please call to discuss

## 2019-02-27 NOTE — Telephone Encounter (Signed)
Patient's wife calling back. She states she has not received a call yet. She says her husbands triglycerides are very high and she is concerned about him not getting his medication.

## 2019-02-27 NOTE — Telephone Encounter (Signed)
Triglycerides are not extremely high.  They are higher than we would like him to be in and therefore would like to treat them.  Unfortunately the best medication is Vascepa.  The other medications do not have as good data behind them.  We can try starting fenofibrate 145 mg daily.  Glenetta Hew, MD

## 2019-03-02 NOTE — Telephone Encounter (Signed)
If we cannot do fenofibrate/TriCor -> then I would like for him to try to an over-the-counter fish oil building from 1000 mg up to 3000 mg a day (1000 g 3 times a day).  Glenetta Hew, MD

## 2019-03-03 NOTE — Telephone Encounter (Signed)
MEDICATION APPROVED PHARMACIST AND WIFE AWARE - 30 DAY SUPPLY

## 2019-03-03 NOTE — Telephone Encounter (Signed)
Patient's wife is calling back stating the insurance company has not received the prior authorization form from our office in order to approver the medication. She states [employer] advised her to have our office reach out to them for an electronic prior authorization so the medication can be filled. The phone number is [phone]. Please advise.

## 2019-03-03 NOTE — Telephone Encounter (Signed)
LM TO CALL BACK ./CY 

## 2019-03-03 NOTE — Telephone Encounter (Signed)
Spoke with wife -  WIFE SPOKE TO INSURANCE  Patient will need a prior authorization   For medication - possibly  Will cover medications    Wife gave nurse   [phone]  [phone]  #  To call  RN DID [place]-- VASCEPA  KEY#  [id number]   AWAITING  FOR DETERMNTATION

## 2019-03-08 NOTE — Telephone Encounter (Signed)
What medication is in question?  Pls send these types of messages to my RN as well (she is not here next week), but was here all last week & was not aware of the need for prior auth.    We can try to figure this out when I am in clinic this week.  Glenetta Hew, MD c

## 2019-04-02 ENCOUNTER — Telehealth: Payer: Self-pay | Admitting: Internal Medicine

## 2019-04-02 NOTE — Telephone Encounter (Signed)
Pts wife states Dr. Hilarie Fredrickson wanted them to let him know if he started having issues and had to start Levaquin. Last night he started feeling bad, was having watery diarrhea and abdominal pain in the center that was radiating out. He is having some nausea but no fever. She wanted to let Dr. Hilarie Fredrickson know that they started the levaquin today at 2pm.

## 2019-04-02 NOTE — Telephone Encounter (Signed)
Noted, he should let us know if symptoms fail to improve with antibiotic therapy or if he develops nausea and vomiting in which case I would order a 2 view abdominal x-ray

## 2019-04-02 NOTE — Telephone Encounter (Signed)
Pt's wife reported that pt is experiencing abd pain, diarrhea and nausea.  He started taking Levaquin today.  Please advise.

## 2019-04-02 NOTE — Telephone Encounter (Signed)
Noted  

## 2019-04-04 ENCOUNTER — Emergency Department (HOSPITAL_COMMUNITY): Payer: Commercial Managed Care - PPO

## 2019-04-04 ENCOUNTER — Encounter (HOSPITAL_COMMUNITY): Payer: Self-pay | Admitting: *Deleted

## 2019-04-04 ENCOUNTER — Other Ambulatory Visit: Payer: Self-pay

## 2019-04-04 ENCOUNTER — Emergency Department (HOSPITAL_COMMUNITY)
Admission: EM | Admit: 2019-04-04 | Discharge: 2019-04-04 | Disposition: A | Discharge status: Home / Self Care | Payer: Commercial Managed Care - PPO | Attending: Emergency Medicine | Admitting: Emergency Medicine

## 2019-04-04 DIAGNOSIS — R1084 Generalized abdominal pain: Secondary | ICD-10-CM | POA: Diagnosis present

## 2019-04-04 DIAGNOSIS — Z7984 Long term (current) use of oral hypoglycemic drugs: Secondary | ICD-10-CM | POA: Insufficient documentation

## 2019-04-04 DIAGNOSIS — E119 Type 2 diabetes mellitus without complications: Secondary | ICD-10-CM | POA: Diagnosis not present

## 2019-04-04 DIAGNOSIS — Z79899 Other long term (current) drug therapy: Secondary | ICD-10-CM | POA: Diagnosis not present

## 2019-04-04 DIAGNOSIS — I2510 Atherosclerotic heart disease of native coronary artery without angina pectoris: Secondary | ICD-10-CM | POA: Diagnosis not present

## 2019-04-04 DIAGNOSIS — I10 Essential (primary) hypertension: Secondary | ICD-10-CM | POA: Diagnosis not present

## 2019-04-04 LAB — CBC WITH DIFFERENTIAL/PLATELET
Abs Immature Granulocytes: 0.03 10*3/uL (ref 0.00–0.07)
Basophils Absolute: 0 10*3/uL (ref 0.0–0.1)
Basophils Relative: 1 %
Eosinophils Absolute: 0 10*3/uL (ref 0.0–0.5)
Eosinophils Relative: 0 %
HCT: 53.8 % — ABNORMAL HIGH (ref 39.0–52.0)
Hemoglobin: 18.9 g/dL — ABNORMAL HIGH (ref 13.0–17.0)
Immature Granulocytes: 0 %
Lymphocytes Relative: 24 %
Lymphs Abs: 2 10*3/uL (ref 0.7–4.0)
MCH: 32.4 pg (ref 26.0–34.0)
MCHC: 35.1 g/dL (ref 30.0–36.0)
MCV: 92.3 fL (ref 80.0–100.0)
Monocytes Absolute: 0.5 10*3/uL (ref 0.1–1.0)
Monocytes Relative: 6 %
Neutro Abs: 5.9 10*3/uL (ref 1.7–7.7)
Neutrophils Relative %: 69 %
Platelets: 252 10*3/uL (ref 150–400)
RBC: 5.83 MIL/uL — ABNORMAL HIGH (ref 4.22–5.81)
RDW: 12.1 % (ref 11.5–15.5)
WBC: 8.4 10*3/uL (ref 4.0–10.5)
nRBC: 0 % (ref 0.0–0.2)

## 2019-04-04 LAB — COMPREHENSIVE METABOLIC PANEL
ALT: 23 U/L (ref 0–44)
AST: 19 U/L (ref 15–41)
Albumin: 4.8 g/dL (ref 3.5–5.0)
Alkaline Phosphatase: 76 U/L (ref 38–126)
Anion gap: 16 — ABNORMAL HIGH (ref 5–15)
BUN: 16 mg/dL (ref 6–20)
CO2: 23 mmol/L (ref 22–32)
Calcium: 9.8 mg/dL (ref 8.9–10.3)
Chloride: 98 mmol/L (ref 98–111)
Creatinine, Ser: 0.86 mg/dL (ref 0.61–1.24)
GFR calc Af Amer: >60 mL/min (ref >60)
GFR calc non Af Amer: >60 mL/min (ref >60)
Glucose, Bld: 172 mg/dL — ABNORMAL HIGH (ref 70–99)
Potassium: 4.1 mmol/L (ref 3.5–5.1)
Sodium: 137 mmol/L (ref 135–145)
Total Bilirubin: 1.7 mg/dL — ABNORMAL HIGH (ref 0.3–1.2)
Total Protein: 8.3 g/dL — ABNORMAL HIGH (ref 6.5–8.1)

## 2019-04-04 LAB — URINALYSIS, ROUTINE W REFLEX MICROSCOPIC
Bacteria, UA: NONE SEEN
Bilirubin Urine: NEGATIVE
Glucose, UA: >=500 mg/dL — AB
Hgb urine dipstick: NEGATIVE
Ketones, ur: 20 mg/dL — AB
Leukocytes,Ua: NEGATIVE
Nitrite: NEGATIVE
Protein, ur: NEGATIVE mg/dL
Specific Gravity, Urine: 1.038 — ABNORMAL HIGH (ref 1.005–1.030)
pH: 5 (ref 5.0–8.0)

## 2019-04-04 LAB — LACTIC ACID, PLASMA
Lactic Acid, Venous: 1.6 mmol/L (ref 0.5–1.9)
Lactic Acid, Venous: 1.1 mmol/L (ref 0.5–1.9)

## 2019-04-04 LAB — LIPASE, BLOOD: Lipase: 15 U/L (ref 11–51)

## 2019-04-04 MED ORDER — FENTANYL CITRATE (PF) 100 MCG/2ML IJ SOLN
100.0000 ug | Freq: Once | INTRAMUSCULAR | Status: AC
  Administered 2019-04-04: 100 ug via INTRAVENOUS
  Filled 2019-04-04: qty 2

## 2019-04-04 MED ORDER — IOHEXOL 300 MG/ML  SOLN
100.0000 mL | Freq: Once | INTRAMUSCULAR | Status: AC | PRN
  Administered 2019-04-04: 19:00:00 100 mL via INTRAVENOUS

## 2019-04-04 MED ORDER — ONDANSETRON HCL 4 MG/2ML IJ SOLN
4.0000 mg | Freq: Once | INTRAMUSCULAR | Status: AC
  Administered 2019-04-04: 17:00:00 4 mg via INTRAVENOUS
  Filled 2019-04-04: qty 2

## 2019-04-04 MED ORDER — SODIUM CHLORIDE 0.9 % IV BOLUS
1000.0000 mL | Freq: Once | INTRAVENOUS | Status: AC
  Administered 2019-04-04: 17:00:00 1000 mL via INTRAVENOUS

## 2019-04-04 MED ORDER — ONDANSETRON 4 MG PO TBDP: 4.0000 mg | ORAL_TABLET | Freq: Three times a day (TID) | ORAL | 0 refills | Status: DC | PRN

## 2019-04-04 NOTE — Discharge Instructions (Addendum)
Clear liquid diet for 2 days and gradual return to normal diet as tolerated. Close follow up with Dr. Hilarie Fredrickson.  Return to the Ed for new or worsening symptoms

## 2019-04-04 NOTE — ED Provider Notes (Signed)
[facility] EMERGENCY DEPARTMENT Provider Note   CSN: [id number] Arrival date & time: 04/04/19  1604    History Abdominal pain   Calvin Cruz is a 59 y.o. male with past medical history significant for DM, HTN, Kidney stones, UC s/p ilioanal anastomosis who presents for evaluation of abdominal pain.  Abdominal pain x3 days.  Contacted his GI, Dr. Posey Pronto who started him on Levaquin.  Has history of recurrent pouchitis however patient states his pain is more generalized in nature.  Initially felt constipated 2 days ago however now has green watery diarrhea.  States he is also had some persistent nausea and emesis.  NBNB emesis last episode 30 minutes PTA.  Patient with multiple prior surgical procedures however patient denies any prior bowel obstructions.  Pain is rated an 8/10.  Describes as cramping in nature.  No radiation of pain to his back, flanks.  No fever, chills, chest pain, shortness of breath, dysuria, unilateral leg swelling, redness or warmth.  Denies additional aggravating or alleviating factors.  Has not take anything for his pain. He is able to pass flatulence    History obtained from patient and past medical records.  No interpreter is used.  Prior Abd surgeries- Cholecystectomy, Appendectomy, ilioanal anastomosis, proctocolectomy hernia repair   Followed GI- [place] Dr. Hilarie Fredrickson for Abd pain, N,D. Started on Levaquin 04/02/19  If has pouchitis GI recommended pouchoscopy on last GI visit on 12/20  Allergy to Flagyl and Cipro  HPI     Past Medical History:  Diagnosis Date  . CAD S/P DES PCI-circumflex 07/22/2014   PCI mCx: PCI: 2.75 x 18 mm Xience Alpine DES to mid LCx at takeoff of OM1 (OM1 jailed 50% stenosis)  . Diabetes ([place])   . Erythrocytosis 11/28/2012  . Essential hypertension   . Fatigue 11/28/2012  . Gout 2014  . Hyperlipidemia with target LDL less than 70   . Hypertriglyceridemia 11/16/2014  . Kidney stones   . Non-STEMI (non-ST elevated myocardial  infarction) ([place]) 07/22/2014   a) RCA: OK, LM: Okay ; LAD: Proximal to mid 40%; RI: 30%;LCx: Mid to distal 90%, distal 20%, OM1 50% --> DES PCI  . Ulcerative colitis    s/p ilioanal anastomosis (colectomy) ; has had multiple episodes of pouchitis.    Patient Active Problem List   Diagnosis Date Noted  . Esophageal dysphagia 09/17/2017  . Polycythemia vera ([place]) 09/09/2016  . Enterocolitis 04/20/2015  . Hypertriglyceridemia 11/16/2014  . Hyperlipidemia with target LDL less than 70   . Essential hypertension   . NSTEMI (non-ST elevated myocardial infarction) ([place]) 07/22/2014  . Ulcerative colitis ([place]) 07/22/2014  . CAD S/P DES PCI-circumflex 07/22/2014  . Pouchitis ([place]) 12/22/2012  . Ventral hernia 12/22/2012  . Hypogonadism male 12/22/2012  . Fatigue 11/28/2012  . Erythrocytosis 11/28/2012  . H/O resection of large bowel 11/28/2012    Past Surgical History:  Procedure Laterality Date  . APPENDECTOMY    . CARDIAC CATHETERIZATION N/A 07/23/2014   Procedure: Left Heart Cath and Coronary Angiography;  Surgeon: Leonie Man, MD;  Location: [place] [facility];  Service: Cardiovascular;  RCA: Mild disease; LM: Okay ; LAD: Proximal to mid 40%; RI: 30%;LCx: Mid to distal 90%, distal 20%, OM1 50%; Mid inferolateral HK, EF 55-65%   . CARDIAC CATHETERIZATION N/A 07/23/2014   Procedure: Coronary Stent Intervention;  Surgeon: Leonie Man, MD;  Location: [place] [facility];  Service: Cardiovascular; PCI: 2.75 x 18 mm Xience Alpine DES to mid LCx at takeoff  of OM1 (OM1 jailed 50% stenosis)  . CHOLECYSTECTOMY    . COLECTOMY    . HERNIA REPAIR     umbilical  . illeoanalanastomosis  04/2001, 11/2001, 03/2202   has a J pouch  . TRANSTHORACIC ECHOCARDIOGRAM  07/22/2014   EF 55-60%, normal wall motion, normal diastolic function,  no valve lesions        Family History  Problem Relation Age of Onset  . Diabetes Mother   . Coronary artery disease Mother   . Cancer Father 91       lung,  smoker  . Diabetes Brother   . Colon cancer Neg Hx   . Esophageal cancer Neg Hx   . Rectal cancer Neg Hx     Social History   Tobacco Use  . Smoking status: Former Smoker    Packs/day: 1.00    Years: 25.00    Pack years: 25.00    Types: Cigarettes    Quit date: 12/22/2001    Years since quitting: 17.2  . Smokeless tobacco: Never Used  Substance Use Topics  . Alcohol use: No    Alcohol/week: 0.0 standard drinks  . Drug use: No    Home Medications Prior to Admission medications   Medication Sig Start Date End Date Taking? Authorizing Provider  acetaminophen (TYLENOL) 325 MG tablet Take 650 mg by mouth every 6 (six) hours as needed for mild pain.   Yes [provider]  BRILINTA 60 MG TABS tablet Take 1 tablet by mouth twice daily Patient taking differently: Take 60 mg by mouth 2 (two) times daily.  08/20/18  Yes Leonie Man, MD  Cholecalciferol (HM VITAMIN D3) 4000 units CAPS Take 1 capsule by mouth every morning.    Yes [provider]  Coenzyme Q10 (COQ10) 100 MG CAPS Take 100 mg by mouth daily. GRADUALLY INCREASE TO 300 MG DAILY Patient taking differently: Take 100 mg by mouth every morning.  11/17/14  Yes Leonie Man, MD  colchicine 0.6 MG tablet Take 0.6 mg by mouth daily as needed (for gout flare).  11/04/15  Yes [provider]  diclofenac sodium (VOLTAREN) 1 % GEL Apply 2 g topically daily as needed (for pain).  09/14/15  Yes [provider]  diphenoxylate-atropine (LOMOTIL) 2.5-0.025 MG tablet Take 1 tablet by mouth 4 (four) times daily as needed for diarrhea or loose stools. Reported on 09/09/2015 Patient taking differently: Take 2 tablets by mouth in the morning and at bedtime. *May also take one table as needed for diarrhea/loose stools 11/22/15  Yes Rehman, Mechele Dawley, MD  empagliflozin (JARDIANCE) 10 MG TABS tablet Take 10 mg by mouth every morning.    Yes [provider]  ezetimibe (ZETIA) 10 MG tablet Take 1 tablet (10 mg  total) by mouth daily. 08/27/16  Yes Leonie Man, MD  icosapent Ethyl (VASCEPA) 1 g capsule Take 2 capsules (2 g total) by mouth 2 (two) times daily. 02/12/19  Yes Leonie Man, MD  Lactobacillus (PROBIOTIC ACIDOPHILUS) CAPS Take 2 capsules by mouth daily.   Yes [provider]  levofloxacin (LEVAQUIN) 500 MG tablet Take 500 mg by mouth daily. 04/03/19  Yes [provider]  metoprolol succinate (TOPROL-XL) 25 MG 24 hr tablet Take 12.5 mg by mouth in the morning and at bedtime.  03/19/19  Yes [provider]  nitroGLYCERIN (NITROSTAT) 0.4 MG SL tablet Place 1 tablet (0.4 mg total) under the tongue every 5 (five) minutes x 3 doses as needed for chest  pain. 10/11/17  Yes Duke, Tami Lin, PA  omeprazole (PRILOSEC OTC) 20 MG tablet Take 20 mg by mouth daily as needed (for acid reflux).    Yes [provider]  OVER THE COUNTER MEDICATION Take 2 tablets by mouth daily. Patient takes 2 by mouth daily - Vita - Sprout Capsules - Multi Vitamin    Yes [provider]  rifaximin (XIFAXAN) 550 MG TABS tablet Take 1 tablet (550 mg total) by mouth 2 (two) times daily. Reported on 05/17/2015 04/16/18  Yes Pyrtle, Lajuan Lines, MD  rosuvastatin (CRESTOR) 10 MG tablet Take 1 tablet twice weekly on Monday and Friday Patient taking differently: Take 10 mg by mouth 2 (two) times a week. Take 1 tablet twice weekly on Monday and Friday 05/07/16  Yes Leonie Man, MD  traMADol (ULTRAM) 50 MG tablet Take 1 tablet (50 mg total) by mouth every 6 (six) hours as needed. Patient taking differently: Take 50 mg by mouth every 6 (six) hours as needed for moderate pain.  07/14/16  Yes Nat Christen, MD  Turmeric (CURCUMIN 95) 500 MG CAPS Take 1 capsule by mouth daily.   Yes [provider]  ULORIC 40 MG tablet Take 40 mg by mouth every morning.  05/09/15  Yes [provider]  ondansetron (ZOFRAN ODT) 4 MG disintegrating tablet Take 1 tablet (4 mg total) by mouth every 8  (eight) hours as needed for nausea or vomiting. 04/04/19   Karuna Balducci A, PA-C    Allergies    Metronidazole, Ciprofloxacin, Dexilant [dexlansoprazole], Morphine and related, and Doxycycline  Review of Systems   Review of Systems  Constitutional: Negative.   HENT: Negative.   Respiratory: Negative.   Cardiovascular: Negative.   Gastrointestinal: Positive for abdominal pain, diarrhea, nausea and vomiting. Negative for abdominal distention, anal bleeding, blood in stool, constipation and rectal pain.  Genitourinary: Negative.   Musculoskeletal: Negative.   Skin: Negative.   Neurological: Negative.   All other systems reviewed and are negative.   Physical Exam Updated Vital Signs BP (!) 142/92   Pulse 87   Temp 97.8 F (36.6 C)   Resp 13   Ht 6\' 6"  (1.981 m)   Wt 108.9 kg   SpO2 96%   BMI 27.73 kg/m   Physical Exam Vitals and nursing note reviewed.  Constitutional:      General: He is not in acute distress.    Appearance: He is well-developed. He is not ill-appearing, toxic-appearing or diaphoretic.  HENT:     Head: Normocephalic and atraumatic.     Mouth/Throat:     Mouth: Mucous membranes are moist.  Eyes:     Pupils: Pupils are equal, round, and reactive to light.  Cardiovascular:     Rate and Rhythm: Normal rate and regular rhythm.     Heart sounds: Normal heart sounds.  Pulmonary:     Effort: Pulmonary effort is normal. No respiratory distress.     Breath sounds: Normal breath sounds.  Abdominal:     General: Bowel sounds are increased. There is no distension.     Palpations: Abdomen is soft.     Tenderness: There is generalized abdominal tenderness. There is no right CVA tenderness, left CVA tenderness, guarding or rebound.     Comments: Multiple old abdominal incisions.  No surrounding erythema, warmth.  No gross incarcerated or strangulated herniations.  Diffuse abdominal tenderness.  Musculoskeletal:        General: Normal range of motion.      Cervical  back: Normal range of motion and neck supple.     Comments: Moves all 4 extremities without difficulty. Compartments soft.  Skin:    General: Skin is warm and dry.     Capillary Refill: Capillary refill takes less than 2 seconds.     Comments: Brisk cap refill  Neurological:     Mental Status: He is alert.     Comments: Ambulatory from triage to room without difficulty    ED Results / Procedures / Treatments   Labs (all labs ordered are listed, but only abnormal results are displayed) Labs Reviewed  CBC WITH DIFFERENTIAL/PLATELET - Abnormal; Notable for the following components:      Result Value   RBC 5.83 (*)    Hemoglobin 18.9 (*)    HCT 53.8 (*)    All other components within normal limits  COMPREHENSIVE METABOLIC PANEL - Abnormal; Notable for the following components:   Glucose, Bld 172 (*)    Total Protein 8.3 (*)    Total Bilirubin 1.7 (*)    Anion gap 16 (*)    All other components within normal limits  URINALYSIS, ROUTINE W REFLEX MICROSCOPIC - Abnormal; Notable for the following components:   Specific Gravity, Urine 1.038 (*)    Glucose, UA >=500 (*)    Ketones, ur 20 (*)    All other components within normal limits  URINE CULTURE  LIPASE, BLOOD  LACTIC ACID, PLASMA  LACTIC ACID, PLASMA    EKG EKG Interpretation  Date/Time:  Saturday April 04 2019 17:10:27 EST Ventricular Rate:  97 PR Interval:    QRS Duration: 96 QT Interval:  335 QTC Calculation: 426 R Axis:   50 Text Interpretation: Sinus rhythm since last tracing no significant change Confirmed by Daleen Bo [phone]) on 04/04/2019 5:17:31 PM   Radiology CT ABDOMEN PELVIS W CONTRAST  Result Date: 04/04/2019 CLINICAL DATA:  Generalized abdominal pain for 3 days, nausea/vomiting/diarrhea EXAM: CT ABDOMEN AND PELVIS WITH CONTRAST TECHNIQUE: Multidetector CT imaging of the abdomen and pelvis was performed using the standard protocol following bolus administration of intravenous contrast.  CONTRAST:  161mL OMNIPAQUE IOHEXOL 300 MG/ML  SOLN COMPARISON:  08/08/2016 FINDINGS: Lower chest: No acute pleural or parenchymal lung disease. Hepatobiliary: Mild diffuse fatty infiltration of the liver. No focal abnormalities. Gallbladder is surgically absent. Pancreas: Unremarkable. No pancreatic ductal dilatation or surrounding inflammatory changes. Spleen: Normal in size without focal abnormality. Adrenals/Urinary Tract: There is a 3 mm nonobstructing right renal calculus reference image 39. Left kidney is unremarkable. The adrenals are normal. Bladder is minimally distended with no focal abnormalities. Stomach/Bowel: There are postsurgical changes from prior colectomy. Interval development of segmental dilatation of loops of jejunum within the central abdomen, measuring up to 4.5 cm. Findings are consistent with developing small bowel obstruction. Distal loops of ileum are decompressed. Vascular/Lymphatic: Aortic atherosclerosis. No enlarged abdominal or pelvic lymph nodes. Incidental note is made of a retroaortic left renal vein, a frequent anatomic variant. Reproductive: Prostate is unremarkable. Other: Postsurgical changes are seen from previous right lower quadrant ostomy. No abdominal hernia. No free fluid. Musculoskeletal: There are no acute or destructive bony lesions. Reconstructed images demonstrate no additional findings. IMPRESSION: 1. Segmental dilatation of proximal jejunum consistent with small-bowel obstruction. 2. 3 mm nonobstructing right renal calculus. 3. Mild diffuse fatty infiltration of the liver. 4. Postsurgical changes from prior colectomy. Electronically Signed   By: Randa Ngo M.D.   On: 04/04/2019 19:10    Procedures Procedures (including critical care time)  Medications Ordered  in ED Medications  sodium chloride 0.9 % bolus 1,000 mL (0 mLs Intravenous Stopped 04/04/19 1848)  ondansetron (ZOFRAN) injection 4 mg (4 mg Intravenous Given 04/04/19 1718)  fentaNYL (SUBLIMAZE)  injection 100 mcg (100 mcg Intravenous Given 04/04/19 1718)  iohexol (OMNIPAQUE) 300 MG/ML solution 100 mL (100 mLs Intravenous Contrast Given 04/04/19 1842)    ED Course  I have reviewed the triage vital signs and the nursing notes.  Pertinent labs & imaging results that were available during my care of the patient were reviewed by me and considered in my medical decision making (see chart for details).  59 year old resents for evaluation abdominal pain, emesis and diarrhea.  Patient with complex abdominal surgical history.  Followed by with our GI, Dr. Posey Pronto.  Has been on Levaquin.  Abdomen diffusely tender.  No gross overlying skin changes or bowel herniations.  No systemic symptoms.  History of recurrent pouchitis however states this feels "different."  Denies prior bowel obstructions.  No chest pain, shortness of breath, abdominal pain radiating to his back.  Patient denies any history of AAA, kidney stones.  Plan on labs, IV fluids, pain meds, CT scan and reevaluate.  1825: Reassessed. Patient feel significant improvement with pain meds and Zofran. No emesis in ED. Pain 2/10 currently. Pending CT AP.  1900: Soft, nontender.  Patient has had normal bowel movement here in the emergency department.  Denies any current pain or nausea.  2000: Patient reassessed. Benign abd exam. Patient states he has had 3 "normal" bowel movements in the ED and is passing flatulence. No emesis in Ed and tolerating PO intake without difficulty. Discussed CT results of likly developing bowel obstruction. Patient states he feels "great" and his symptoms have resolved here in the ED. Discussed with patient we typically admit developing SBO however patient declines admission at this time. Patient voiced risk vs benefit of declining inpatient admission at this time and requests dc home. Patient appear to have the capacity to make medical decisions. States prefers to manage his symptoms at home and return if worsening.  Discussed with patient clear liquid diet for 2 days with gradual return to his normal diet as tolerated. He is to return for new or worsening symptoms. Discussed CT findings and plan with attending Dr. Eulis Foster who is agreeable for outpatient management and return if worsening symptoms.  Labs and imaging personally reviewed and interpreted:   Clinical Course as of Apr 04 2023  Sat Apr 04, 2019  1828 No leukocytosis, hemoglobin 18.9, at baseline for his polycythemia.  CBC with Differential(!) [BH]  1828 Hyperglycemia to 172, total bili 1.7 similar to labs in past, anion gap 16, likely related to dehydration.  Low suspicion for DKA. Normal CO2  Comprehensive metabolic panel(!) [BH]  [id number] 1.6  Lactic acid, plasma [BH]  1829 15  Lipase, blood [BH]  1829 No evidence of infection, mild ketonuria and glucose uria.  Urinalysis, Routine w reflex microscopic(!) [BH]  2023 No STEMI. No change from previous EKG  EKG 12-Lead [BH]  2023 Possible developing small bowel obstruction  CT ABDOMEN PELVIS W CONTRAST [BH]    Clinical Course User Index [BH] Phylicia Mcgaugh A, PA-C   The patient has been appropriately medically screened and/or stabilized in the ED. I have low suspicion for any other emergent medical condition which would require further screening, evaluation or treatment in the ED or require inpatient management.  Patient is hemodynamically stable and in no acute distress.  Patient able to ambulate in  department prior to ED.  Evaluation does not show acute pathology that would require ongoing or additional emergent interventions while in the emergency department or further inpatient treatment.  I have discussed the diagnosis with the patient and answered all questions.  Pain is been managed while in the emergency department and patient has no further complaints prior to discharge.  Patient is comfortable with plan discussed in room and is stable for discharge at this time.  I have discussed strict  return precautions for returning to the emergency department.  Patient was encouraged to follow-up with PCP/specialist refer to at discharge.  MDM Rules/Calculators/A&P                       Final Clinical Impression(s) / ED Diagnoses Final diagnoses:  Generalized abdominal pain    Rx / DC Orders ED Discharge Orders         Ordered    ondansetron (ZOFRAN ODT) 4 MG disintegrating tablet  Every 8 hours PRN     04/04/19 2022           Yarelis Ambrosino A, PA-C 04/04/19 2025    Daleen Bo, MD 04/04/19 2328

## 2019-04-04 NOTE — ED Notes (Signed)
Pt alert, able to answer all questions asked with no problems, reports that he has a will but does not have POA or a legal guardian.

## 2019-04-04 NOTE — ED Triage Notes (Signed)
Pt c/o abd pain with n/v/d that started 2-3 days, denies any fever.

## 2019-04-04 NOTE — ED Notes (Signed)
Pt ambulatory to waiting room. Pt verbalized understanding of discharge instructions.   

## 2019-04-06 ENCOUNTER — Other Ambulatory Visit: Payer: Self-pay

## 2019-04-06 DIAGNOSIS — K56600 Partial intestinal obstruction, unspecified as to cause: Secondary | ICD-10-CM

## 2019-04-06 LAB — URINE CULTURE: Culture: NO GROWTH

## 2019-04-06 NOTE — Telephone Encounter (Signed)
Order in epic for KUB, pt scheduled to see Tye Savoy NP 04/09/19@1 :30pm. Pt aware.

## 2019-04-06 NOTE — Telephone Encounter (Signed)
Pt's wife reported that pt was in the ED at [facility] where he had CT and blood works done.

## 2019-04-06 NOTE — Telephone Encounter (Signed)
DOD  CT, labs - Reviewed  PSBO on CT (not related to the pouch, likely d/t adhesions, r/o other causes)-clinically improving.   I have talked to the patient and patient's wife in detail.  He is getting better.  Able to pass gas.  Abdominal pain/distention is better.  No further N/V.  Tolerating clear liquids  Plan: -Clear liquid diet until 5 PM today.  Then full liquid diet.  By tomorrow morning (if no N/V), can try soft diet. -OK to resume all PO meds. -X-ray KUB supine and upright (2 view) 2/17 -Needs to be seen in APP clinic on 2/18 or 2/19 -If there is any worsening of symptoms, patient to get in touch with [place] promptly and go to ED. -Off work until FU visit.  RG

## 2019-04-06 NOTE — Telephone Encounter (Signed)
Pyrtle patient-Pts wife states they went to the ER at [facility] Saturday and he had a CT scan done and labs done. He was told to do a liquid diet. Pts wife wanted to know if there is anything else he needs to do. Please advise as DOD.

## 2019-04-08 ENCOUNTER — Ambulatory Visit (INDEPENDENT_AMBULATORY_CARE_PROVIDER_SITE_OTHER)
Admission: RE | Admit: 2019-04-08 | Discharge: 2019-04-08 | Disposition: A | Discharge status: Home / Self Care | Payer: Commercial Managed Care - PPO | Source: Ambulatory Visit | Attending: Gastroenterology | Admitting: Gastroenterology

## 2019-04-08 ENCOUNTER — Other Ambulatory Visit: Payer: Self-pay

## 2019-04-08 DIAGNOSIS — K56600 Partial intestinal obstruction, unspecified as to cause: Secondary | ICD-10-CM | POA: Diagnosis not present

## 2019-04-09 ENCOUNTER — Ambulatory Visit: Payer: Commercial Managed Care - PPO | Admitting: Nurse Practitioner

## 2019-04-15 ENCOUNTER — Encounter: Payer: Self-pay | Admitting: Nurse Practitioner

## 2019-04-15 ENCOUNTER — Ambulatory Visit: Payer: Commercial Managed Care - PPO | Admitting: Nurse Practitioner

## 2019-04-15 VITALS — BP 113/62 | HR 85 | Ht 78.0 in | Wt 228.2 lb

## 2019-04-15 DIAGNOSIS — K56609 Unspecified intestinal obstruction, unspecified as to partial versus complete obstruction: Secondary | ICD-10-CM

## 2019-04-15 NOTE — Progress Notes (Signed)
IMPRESSION and PLAN:    Calvin Cruz is a 59 y.o. male with a pmh significant for, not necessarily limited to CAD / PCI on Brillinta,  HTN, hyperlipidemia, kidney stones.     # History of pan ulcerative colitis, status post total proctocolectomy with ileal pouch-anal anastomosis in 2003 after presenting with fulminant colitis.   # Recurrent SBO. Resolved.   -Suspect this was secondary to adhesions which I discussed with the patient and his wife.  They wondered if his recent fall could have led to bowel obstruction, seems unlikely -follow up prn  # Elevated HGB / HCT in setting of SBO, probably dehydration   HPI:    Primary GI: Dr. Hilarie Fredrickson  Chief complaint : Follow-up on small bowel obstruction  **History comes from the chart and patient  Calvin Cruz established care here every 2020 . He has a history of pan ulcerative colitis with fulminant colitis in 2003, status post total proctocolectomy with ileal pouch-anal anastomosis.  Since then he has been treated intermittently for pouchitis as well bowel obstructive symptoms.  He has Levaquin and Xifaxan on hand for pouchitis which he had not needed for couple years until just recently.  Patient was last seen here 01/20/2019, doing very well at the time on probiotics, and avoidance of certain foods such as fresh vegetables.   Earlier this month patient fell 6 feet from a ladder.  He fell on left hip and hit inside of right knee.  No medical evaluation was felt to be necessary.  A week or so later he developed left-sided abdominal pain radiating to right abdomen.  Initially patient thought this could be recurrent pouchitis,  though location was different than usual.  At any rate they started Levaquin and Xifaxan .  Within a couple of days patient was getting worse, he was constipated and stopped taking Lomotil. Abdomen became distended he vomited x1, finally went to [facility] ED. CT scan with contrast showed segmental dilation of the  proximal jejunum consistent with a SBO   Patient says he received some pain medication in ED, later began having bowel movements and was discharged home from the ED.  Wife spoke later  to our physician on call.  Started on a clear liquid diet,  asked to come in for follow-up abdominal films. A  2 view of the abdomen on 04/08/2019 was negative for SBO.  He is here for follow-up.  Calvin Cruz is doing much better.  No abdominal pain, no nausea, no recurrent distention.  Typically Calvin Cruz averages 6-8 bowel movements a day.  Currently having ~ 5 soft, unformed stools a day so for now still holding Lomotil.  Of note he did completed 10-day course of Levaquin which was started just before he went to the emergency department.   Data Reviewed:  04/04/19 Glucose 172, lipase 15, liver enzymes normal, alkaline phosphatase normal.  Bilirubin elevated at 1.9 (chronic, intermittent elevation) WBC 8.4, hemoglobin 18.9  Review of systems:     No chest pain, no SOB, no fevers, no urinary sx   Past Medical History:  Diagnosis Date  . CAD S/P DES PCI-circumflex 07/22/2014   PCI mCx: PCI: 2.75 x 18 mm Xience Alpine DES to mid LCx at takeoff of OM1 (OM1 jailed 50% stenosis)  . Diabetes ([place])   . Erythrocytosis 11/28/2012  . Essential hypertension   . Fatigue 11/28/2012  . Gout 2014  . Hyperlipidemia with target LDL less than 70   .  Hypertriglyceridemia 11/16/2014  . Kidney stones   . Non-STEMI (non-ST elevated myocardial infarction) ([place]) 07/22/2014   a) RCA: OK, LM: Okay ; LAD: Proximal to mid 40%; RI: 30%;LCx: Mid to distal 90%, distal 20%, OM1 50% --> DES PCI  . Ulcerative colitis    s/p ilioanal anastomosis (colectomy) ; has had multiple episodes of pouchitis.    Patient's surgical history, family medical history, social history, medications and allergies were all reviewed in Epic   Serum creatinine: 0.86 mg/dL 04/04/19 1635 Estimated creatinine clearance: 118.1 mL/min  Current Outpatient Medications  Medication  Sig Dispense Refill  . acetaminophen (TYLENOL) 325 MG tablet Take 650 mg by mouth every 6 (six) hours as needed for mild pain.    Marland Kitchen BRILINTA 60 MG TABS tablet Take 1 tablet by mouth twice daily (Patient taking differently: Take 60 mg by mouth 2 (two) times daily. ) 60 tablet 3  . Cholecalciferol (HM VITAMIN D3) 4000 units CAPS Take 1 capsule by mouth every morning.     . Coenzyme Q10 (COQ10) 100 MG CAPS Take 100 mg by mouth daily. GRADUALLY INCREASE TO 300 MG DAILY (Patient taking differently: Take 100 mg by mouth every morning. ) 90 each 11  . colchicine 0.6 MG tablet Take 0.6 mg by mouth daily as needed (for gout flare).     . diclofenac sodium (VOLTAREN) 1 % GEL Apply 2 g topically daily as needed (for pain).     Marland Kitchen diphenoxylate-atropine (LOMOTIL) 2.5-0.025 MG tablet Take 1 tablet by mouth 4 (four) times daily as needed for diarrhea or loose stools. Reported on 09/09/2015 (Patient taking differently: Take 2 tablets by mouth in the morning and at bedtime. *May also take one table as needed for diarrhea/loose stools) 120 tablet 2  . empagliflozin (JARDIANCE) 10 MG TABS tablet Take 10 mg by mouth every morning.     . ezetimibe (ZETIA) 10 MG tablet Take 1 tablet (10 mg total) by mouth daily. 90 tablet 3  . icosapent Ethyl (VASCEPA) 1 g capsule Take 2 capsules (2 g total) by mouth 2 (two) times daily. 120 capsule 11  . Lactobacillus (PROBIOTIC ACIDOPHILUS) CAPS Take 2 capsules by mouth daily.    Marland Kitchen levofloxacin (LEVAQUIN) 500 MG tablet Take 500 mg by mouth daily.    . metoprolol succinate (TOPROL-XL) 25 MG 24 hr tablet Take 12.5 mg by mouth in the morning and at bedtime.     . nitroGLYCERIN (NITROSTAT) 0.4 MG SL tablet Place 1 tablet (0.4 mg total) under the tongue every 5 (five) minutes x 3 doses as needed for chest pain. 25 tablet 3  . omeprazole (PRILOSEC OTC) 20 MG tablet Take 20 mg by mouth daily as needed (for acid reflux).     . ondansetron (ZOFRAN ODT) 4 MG disintegrating tablet Take 1 tablet (4  mg total) by mouth every 8 (eight) hours as needed for nausea or vomiting. 20 tablet 0  . OVER THE COUNTER MEDICATION Take 2 tablets by mouth daily. Patient takes 2 by mouth daily - Vita - Sprout Capsules - Multi Vitamin     . rifaximin (XIFAXAN) 550 MG TABS tablet Take 1 tablet (550 mg total) by mouth 2 (two) times daily. Reported on 05/17/2015 20 tablet 1  . rosuvastatin (CRESTOR) 10 MG tablet Take 1 tablet twice weekly on Monday and Friday (Patient taking differently: Take 10 mg by mouth 2 (two) times a week. Take 1 tablet twice weekly on Monday and Friday) 60 tablet 0  . traMADol (ULTRAM) 50  MG tablet Take 1 tablet (50 mg total) by mouth every 6 (six) hours as needed. (Patient taking differently: Take 50 mg by mouth every 6 (six) hours as needed for moderate pain. ) 25 tablet 0  . Turmeric (CURCUMIN 95) 500 MG CAPS Take 1 capsule by mouth daily.    Marland Kitchen ULORIC 40 MG tablet Take 40 mg by mouth every morning.   1   No current facility-administered medications for this visit.    Physical Exam:     BP 113/62   Pulse 85   Ht 6\' 6"  (1.981 m)   Wt 228 lb 3.2 oz (103.5 kg)   SpO2 97%   BMI 26.37 kg/m   GENERAL:  Pleasant male in NAD PSYCH: : Cooperative, normal affect CARDIAC:  RRR,  no peripheral edema PULM: Normal respiratory effort, lungs CTA bilaterally, no wheezing ABDOMEN:  Nondistended, soft, nontender. No obvious masses, no hepatomegaly,  normal bowel sounds SKIN:  turgor, no lesions seen Musculoskeletal:  Normal muscle tone, normal strength NEURO: Alert and oriented x 3, no focal neurologic deficits  I spent 30 minutes total reviewing records, obtaining history, performing exam, counseling patient and documenting visit / findings.   Tye Savoy , NP 04/15/2019, 4:44 PM

## 2019-04-15 NOTE — Patient Instructions (Signed)
If you are age 61 or older, your body mass index should be between 23-30. Your Body mass index is 26.37 kg/m. If this is out of the aforementioned range listed, please consider follow up with your Primary Care Provider.  If you are age 58 or younger, your body mass index should be between 19-25. Your Body mass index is 26.37 kg/m. If this is out of the aformentioned range listed, please consider follow up with your Primary Care Provider.   Follow up as needed.  Thank you for choosing me and [facility].   Tye Savoy, NP

## 2019-04-28 NOTE — Progress Notes (Signed)
Addendum: Reviewed and agree with assessment and management plan. Agree that presentation was consistent with partial SBO as opposed to pouchitis.  If more frequent then would need to at least consider LOA. Destry Bezdek, Lajuan Lines, MD

## 2019-06-13 ENCOUNTER — Other Ambulatory Visit: Payer: Self-pay | Admitting: Cardiology

## 2019-07-16 ENCOUNTER — Other Ambulatory Visit: Payer: Self-pay | Admitting: Cardiology

## 2019-08-09 ENCOUNTER — Other Ambulatory Visit: Payer: Self-pay | Admitting: Cardiology

## 2019-08-28 ENCOUNTER — Ambulatory Visit: Payer: Commercial Managed Care - PPO | Admitting: Family Medicine

## 2019-08-28 ENCOUNTER — Other Ambulatory Visit: Payer: Self-pay

## 2019-08-28 DIAGNOSIS — M7071 Other bursitis of hip, right hip: Secondary | ICD-10-CM

## 2019-08-28 MED ORDER — METHYLPREDNISOLONE ACETATE 40 MG/ML IJ SUSP
40.0000 mg | Freq: Once | INTRAMUSCULAR | Status: AC
  Administered 2019-08-28: 40 mg via INTRA_ARTICULAR

## 2019-08-28 NOTE — Progress Notes (Signed)
[facility] Attending Note: I have seen and examined this patient. I have discussed this patient with the resident and reviewed the assessment and plan as documented above. I agree with the resident's findings and plan. He was sent from outside office with diagnosis of probable ischial bursitis.  He is exam is consistent with that today.  Ultrasound also was consistent with that with a well-circumscribed bursa sac adjacent to the ischium.  Patient was given informed consent, signed copy in the chart.  Ultrasound-guided injection performed under sterile conditions.  Patient tolerated procedure well.  1 cc of Depo-Medrol 40 mg per mill +4 cc of lidocaine 1% without epinephrine were injected using ultrasound guidance into the bursa sac.  Patient tolerated procedure well.  I asked them to call in a couple of weeks and let me know if he had improvement or not.  If he had partial improvement, I would consider a second injection.  He has no improvement, we will have to come up with a plan B.  I answered all her questions.

## 2019-08-28 NOTE — Patient Instructions (Signed)
Today we gave you a corticosteroid injection for your ischial bursitis.  We did that under ultrasound.    We discussed that it may have a small increase amount of pain in the next 24 hours as in 10%.  It should not hurt any more than that in the next 24 hours.   I expect it to resolve over the next 2 weeks.  If it has not, please give [place] a call and we can discuss next steps.   If you have any worrisome symptoms such as fever, redness at the site, significant increased pain (greater than  than 10% increase), please let us know soon as possible.  It was nice to meet you!

## 2019-08-28 NOTE — Progress Notes (Signed)
  KLEVER TWYFORD - 59 y.o. male MRN [id number]  Date of birth: 22-Jan-1961    SUBJECTIVE:      Chief Complaint:/ HPI:  R ischial bursitis: this is a pleasant 59 year old patient with history of T2DM and MI with stenting (2016) presenting for 6 months of progressively worsening "right buttock" pain that is worse with sitting, better with standing, walking. He describes the pain as sharp and reports that it radiates through to his groin. He also has a history of R hip arthritis but is able to differentiate between the causes of his pain. He reports that he fell about 3 feet off of a ladder about 2-3 months ago, but his pain has been going on before that happened, denied any significant injury or accident that started his pain 6 months ago. He has been treating his pain with Tylenol (cannot take NSAIDs). He reports that he can only be in the car seated for about 15 minutes at a time after which he has to pull over to walk some.   He was seen by Dr. Layne Benton (ortho) in [place] who recommended US-guided injection of ischial bursitis.   ROS:     See HPI  PERTINENT  PMH / PSH FH / / SH:  Past Medical, Surgical, Social, and Family History Reviewed & Updated in the EMR.  Pertinent findings include:  Patient Active Problem List   Diagnosis Date Noted  . Esophageal dysphagia 09/17/2017  . Polycythemia vera ([place]) 09/09/2016  . Enterocolitis 04/20/2015  . Hypertriglyceridemia 11/16/2014  . Hyperlipidemia with target LDL less than 70   . Essential hypertension   . NSTEMI (non-ST elevated myocardial infarction) ([place]) 07/22/2014  . Ulcerative colitis ([place]) 07/22/2014  . CAD S/P DES PCI-circumflex 07/22/2014  . Pouchitis ([place]) 12/22/2012  . Ventral hernia 12/22/2012  . Hypogonadism male 12/22/2012  . Fatigue 11/28/2012  . Erythrocytosis 11/28/2012  . H/O resection of large bowel 11/28/2012    OBJECTIVE: BP (!) 142/84   Ht 6\' 6"  (1.981 m)   Wt 235 lb (106.6 kg)   BMI 27.16 kg/m   Physical Exam:   Vital signs are reviewed.  GEN: Alert and oriented, NAD Pulm: Breathing unlabored PSY: normal mood, congruent affect  MSK: Hip:  - Inspection: No gross deformity, no swelling, erythema, or ecchymosis - Palpation: TTP of posterior ischium, none over greater trochanter - ROM: Normal range of motion on Flexion, extension, abduction, internal and external rotation - Strength: Normal strength. - Neuro/vasc: NV intact distally - Special Tests: Negative FABER and FADIR.  Negative Trendelenberg.    ASSESSMENT & PLAN:  bursitis CSI

## 2019-09-17 ENCOUNTER — Telehealth: Payer: Self-pay

## 2019-09-17 NOTE — Telephone Encounter (Signed)
-----   Message from Carolyne Littles sent at 09/17/2019  2:34 PM EDT ----- Regarding: phone message Pt wife states they were to call Dr. Nori Riis with an update. She states  The injection did help somewhat and now is asking what the next step is? They didn't want to schedule another appt until he hears from Dr. Nori Riis.

## 2019-09-22 NOTE — Telephone Encounter (Signed)
Megan Two options: if it helped enough and wants to watch it for a few weeks to see if it resolves further, that would be an option. Tat is the most conservative option.  A more aggressive approach would be a second injection. THANKS! Dorcas Mcmurray

## 2019-09-25 NOTE — Telephone Encounter (Signed)
Spoke with pt's wife. Pt would like to f/u with Dr. Nori Riis in 2 weeks to discuss getting a second injection.

## 2019-10-09 ENCOUNTER — Ambulatory Visit: Payer: Commercial Managed Care - PPO | Admitting: Family Medicine

## 2019-10-23 ENCOUNTER — Other Ambulatory Visit: Payer: Self-pay

## 2019-10-23 ENCOUNTER — Ambulatory Visit: Payer: Commercial Managed Care - PPO | Admitting: Family Medicine

## 2019-10-23 VITALS — BP 140/86 | Ht 78.0 in | Wt 240.0 lb

## 2019-10-23 DIAGNOSIS — M7071 Other bursitis of hip, right hip: Secondary | ICD-10-CM | POA: Diagnosis not present

## 2019-10-23 DIAGNOSIS — M7918 Myalgia, other site: Secondary | ICD-10-CM

## 2019-10-23 MED ORDER — GABAPENTIN 100 MG PO CAPS: ORAL_CAPSULE | ORAL | 0 refills | Status: DC

## 2019-10-23 NOTE — Patient Instructions (Addendum)
Take by tablet by mouth Day 1-3:   take one at night  Day 4-6:  Take 2 at night Day 7-10:  take 3 at night Day 11-14: 4 at night  Please let me know if anything changes Call and leave me a message when you get back from the beach

## 2019-10-24 DIAGNOSIS — M7918 Myalgia, other site: Secondary | ICD-10-CM | POA: Insufficient documentation

## 2019-10-24 NOTE — Assessment & Plan Note (Signed)
At last office visit we did [place] which showed apparent ischial bursitis. CSi seemed to help initially but did not give much lasting relief. I had a long discussion with him and his wife (35 minutes face to face time) as I am concerned this pain may be coming from his back rather than from ischial bursa. Pain seems to be more severe now, it improves with standing, bit still present in seated position even if he rotates all of his weight to left buttock.   He has multiple other significant medical issues, We discussed MRi but  He would like to avoid any more surgical interventions so we will hold off on that. We decided to try gabapentin. He will contact me in 2-3 weeks with update. He currently can only sit for 15 minutes so we discussed a significant improvement would be if he could double that time to 30 minutes with the initial taper up of gabapentin.

## 2019-10-24 NOTE — Progress Notes (Signed)
  GREGOIRE BENNIS - 59 y.o. male MRN [id number]  Date of birth: Sep 26, 1960    SUBJECTIVE:      Chief Complaint:/ HPI:   continued problems with right buttock pain with sitting. We had done CSI of a prominent ishial bursa seen on [place] and that seemed to help aboit 25% for a couple of weeks, but symptoms have returned. Symptoms may actually be getting some wose. Can only sit for about 15 minutes before he has to get up. No pain with standingand that usually revielves the pain. If he is seated, and rolls all of his weight to the left buttock, he continues to have right buttock pain and it continues to worsen.  Pain shoots down right anterior thigh, 5-9/10. At times > 10/10.  P.ERTINENT  PMH / PSH: I have reviewed the patient's medications, allergies, past medical and surgical history, smoking status.  Pertinent findings that relate to today's visit / issues include: Colectomy with jejunal anal anastamosis CAD  OBJECTIVE: BP 140/86   Ht 6\' 6"  (1.981 m)   Wt 240 lb (108.9 kg)   BMI 27.73 kg/m   Physical Exam:  Vital signs are reviewed. GEN WD WN NAD Buttocks ontender and I cannot reproduce his pain Normal SLCR. Normal Lower extremity strength and symmetrical. Nomral gait  RESULTS:Reviewed a CT scan he had done a few months ago and he has some DJD of spine.  ASSESSMENT & PLAN:  See problem based charting & AVS for pt instructions. No problem-specific Assessment & Plan notes found for this encounter.

## 2019-11-26 ENCOUNTER — Telehealth: Payer: Self-pay

## 2019-11-27 MED ORDER — GABAPENTIN 600 MG PO TABS: 600.0000 mg | ORAL_TABLET | Freq: Every day | ORAL | 1 refills | Status: DC

## 2019-11-27 NOTE — Telephone Encounter (Signed)
Pt's wife is aware of dosage change. Will contact us in 1 month to let us know how he's doing.

## 2019-11-27 NOTE — Telephone Encounter (Signed)
Calvin Cruz! I am sending in a NEW Rx--it is a NEW dose. He wuill take one at night for a month and let me know  It is a 600  Mg dose so he should not have too much change in side effects. The first 2-3 days he may have a little more morning sleepiness but it should resolve. If not, let me know.  I WOULD like him to update me in a month or so.  Thank him for following up and remind him to call in interim if he has questions or problems. Dorcas Mcmurray

## 2019-12-09 ENCOUNTER — Telehealth: Payer: Self-pay

## 2019-12-09 NOTE — Telephone Encounter (Signed)
   Primary Cardiologist: Glenetta Hew, MD  Chart reviewed as part of pre-operative protocol coverage. Patient was contacted 12/09/2019 in reference to pre-operative risk assessment for pending surgery as outlined below.  Calvin Cruz was last seen on 02/05/19 by Dr. Ellyn Hack.  Since that day, Calvin Cruz has done well. He can complete more than 4.0 METS without angina. Per Dr. Ellyn Hack, he may hold brilinta 5-7 days prior to procedure.  Therefore, based on ACC/AHA guidelines, the patient would be at acceptable risk for the planned procedure without further cardiovascular testing.   The patient was advised that if he develops new symptoms prior to surgery to contact our office to arrange for a follow-up visit, and he verbalized understanding.  I will route this recommendation to the requesting party via Epic fax function and remove from pre-op pool. Please call with questions.  [place], PA 12/09/2019, 4:22 PM

## 2019-12-09 NOTE — Telephone Encounter (Signed)
   [facility] Pre-operative Risk Assessment    HEARTCARE STAFF: - Please ensure there is not already an duplicate clearance open for this procedure. - Under Visit Info/Reason for Call, type in Other and utilize the format Clearance MM/DD/YY or Clearance TBD. Do not use dashes or single digits. - If request is for dental extraction, please clarify the # of teeth to be extracted.  Request for surgical clearance:  1. What type of surgery is being performed? Left Thumb Digital Nerve Exploration + Likely Repair Possibly with Conduit  2. When is this surgery scheduled? October 29,2021   3. What type of clearance is required (medical clearance vs. Pharmacy clearance to hold med vs. Both)? Both  4. Are there any medications that need to be held prior to surgery and how long?Brilinta   5. Practice name and name of physician performing surgery? [facility]. Grandville Silos MD  6. What is the office phone number? [phone]   7.   What is the office fax number? [phone]  8.   Anesthesia type (None, local, MAC, general) ? MAC   Monia Pouch 12/09/2019, 3:34 PM  _________________________________________________________________   (provider comments below)

## 2020-01-30 ENCOUNTER — Other Ambulatory Visit: Payer: Self-pay | Admitting: Family Medicine

## 2020-02-03 ENCOUNTER — Other Ambulatory Visit: Payer: Self-pay | Admitting: Family Medicine

## 2020-02-03 ENCOUNTER — Telehealth: Payer: Self-pay

## 2020-02-05 ENCOUNTER — Other Ambulatory Visit: Payer: Self-pay

## 2020-02-05 MED ORDER — GABAPENTIN 600 MG PO TABS: 600.0000 mg | ORAL_TABLET | Freq: Every day | ORAL | 1 refills | Status: AC

## 2020-02-05 NOTE — Progress Notes (Signed)
Pt's wife called again asking for a refill. States that the current dose has resolved some symptoms but not completely. Wondering if he should increase his dose.  He is out of medicine completely so I refilled it and told pt's wife that once I hear back from Dr. Nori Riis we can figure out the dosage increase.

## 2020-02-05 NOTE — Telephone Encounter (Signed)
Megan Just FYI

## 2020-02-05 NOTE — Telephone Encounter (Signed)
I had already refilled his med via pharmacy request at cuerrent dose I left VM and told him tolet me know which option he wants OPTION 1:  if he is comfortable at this dose--if so, we will stay at current dose OR, OPTION 2. if he is still having pain or discomfort to call back and leave me a message and we can go up on dose. If we do that, I will need to call in a different dose or at least more pills Calvin Cruz

## 2020-03-03 ENCOUNTER — Other Ambulatory Visit: Payer: Self-pay | Admitting: Cardiology

## 2020-03-05 ENCOUNTER — Other Ambulatory Visit: Payer: Self-pay

## 2020-03-05 ENCOUNTER — Emergency Department (HOSPITAL_COMMUNITY): Payer: Commercial Managed Care - PPO

## 2020-03-05 ENCOUNTER — Encounter (HOSPITAL_COMMUNITY): Payer: Self-pay | Admitting: Emergency Medicine

## 2020-03-05 ENCOUNTER — Inpatient Hospital Stay (HOSPITAL_COMMUNITY)
Admission: EM | Admit: 2020-03-05 | Discharge: 2020-04-19 | DRG: 981 | Disposition: E | Payer: Commercial Managed Care - PPO | Attending: Pulmonary Disease | Admitting: Pulmonary Disease

## 2020-03-05 DIAGNOSIS — Z79899 Other long term (current) drug therapy: Secondary | ICD-10-CM

## 2020-03-05 DIAGNOSIS — J9690 Respiratory failure, unspecified, unspecified whether with hypoxia or hypercapnia: Secondary | ICD-10-CM

## 2020-03-05 DIAGNOSIS — B957 Other staphylococcus as the cause of diseases classified elsewhere: Secondary | ICD-10-CM | POA: Diagnosis not present

## 2020-03-05 DIAGNOSIS — T797XXA Traumatic subcutaneous emphysema, initial encounter: Secondary | ICD-10-CM | POA: Diagnosis not present

## 2020-03-05 DIAGNOSIS — Z0189 Encounter for other specified special examinations: Secondary | ICD-10-CM

## 2020-03-05 DIAGNOSIS — A419 Sepsis, unspecified organism: Secondary | ICD-10-CM

## 2020-03-05 DIAGNOSIS — I10 Essential (primary) hypertension: Secondary | ICD-10-CM | POA: Diagnosis present

## 2020-03-05 DIAGNOSIS — K567 Ileus, unspecified: Secondary | ICD-10-CM | POA: Diagnosis not present

## 2020-03-05 DIAGNOSIS — K56609 Unspecified intestinal obstruction, unspecified as to partial versus complete obstruction: Secondary | ICD-10-CM

## 2020-03-05 DIAGNOSIS — R54 Age-related physical debility: Secondary | ICD-10-CM | POA: Diagnosis present

## 2020-03-05 DIAGNOSIS — I952 Hypotension due to drugs: Secondary | ICD-10-CM | POA: Diagnosis not present

## 2020-03-05 DIAGNOSIS — J1282 Pneumonia due to coronavirus disease 2019: Secondary | ICD-10-CM | POA: Diagnosis not present

## 2020-03-05 DIAGNOSIS — Z9911 Dependence on respirator [ventilator] status: Secondary | ICD-10-CM

## 2020-03-05 DIAGNOSIS — Z515 Encounter for palliative care: Secondary | ICD-10-CM

## 2020-03-05 DIAGNOSIS — E785 Hyperlipidemia, unspecified: Secondary | ICD-10-CM | POA: Diagnosis present

## 2020-03-05 DIAGNOSIS — I4891 Unspecified atrial fibrillation: Secondary | ICD-10-CM

## 2020-03-05 DIAGNOSIS — I5022 Chronic systolic (congestive) heart failure: Secondary | ICD-10-CM | POA: Diagnosis present

## 2020-03-05 DIAGNOSIS — R0902 Hypoxemia: Secondary | ICD-10-CM

## 2020-03-05 DIAGNOSIS — I480 Paroxysmal atrial fibrillation: Secondary | ICD-10-CM | POA: Diagnosis not present

## 2020-03-05 DIAGNOSIS — K219 Gastro-esophageal reflux disease without esophagitis: Secondary | ICD-10-CM | POA: Diagnosis present

## 2020-03-05 DIAGNOSIS — Z9289 Personal history of other medical treatment: Secondary | ICD-10-CM

## 2020-03-05 DIAGNOSIS — A4151 Sepsis due to Escherichia coli [E. coli]: Secondary | ICD-10-CM | POA: Diagnosis not present

## 2020-03-05 DIAGNOSIS — Z9049 Acquired absence of other specified parts of digestive tract: Secondary | ICD-10-CM

## 2020-03-05 DIAGNOSIS — J9601 Acute respiratory failure with hypoxia: Secondary | ICD-10-CM

## 2020-03-05 DIAGNOSIS — E222 Syndrome of inappropriate secretion of antidiuretic hormone: Secondary | ICD-10-CM | POA: Diagnosis not present

## 2020-03-05 DIAGNOSIS — T380X5A Adverse effect of glucocorticoids and synthetic analogues, initial encounter: Secondary | ICD-10-CM | POA: Diagnosis not present

## 2020-03-05 DIAGNOSIS — I4892 Unspecified atrial flutter: Secondary | ICD-10-CM | POA: Diagnosis not present

## 2020-03-05 DIAGNOSIS — R109 Unspecified abdominal pain: Secondary | ICD-10-CM

## 2020-03-05 DIAGNOSIS — R339 Retention of urine, unspecified: Secondary | ICD-10-CM | POA: Diagnosis not present

## 2020-03-05 DIAGNOSIS — E781 Pure hyperglyceridemia: Secondary | ICD-10-CM | POA: Diagnosis present

## 2020-03-05 DIAGNOSIS — U071 COVID-19: Secondary | ICD-10-CM | POA: Diagnosis not present

## 2020-03-05 DIAGNOSIS — I471 Supraventricular tachycardia: Secondary | ICD-10-CM

## 2020-03-05 DIAGNOSIS — M109 Gout, unspecified: Secondary | ICD-10-CM | POA: Diagnosis present

## 2020-03-05 DIAGNOSIS — G9341 Metabolic encephalopathy: Secondary | ICD-10-CM | POA: Diagnosis not present

## 2020-03-05 DIAGNOSIS — Z8249 Family history of ischemic heart disease and other diseases of the circulatory system: Secondary | ICD-10-CM

## 2020-03-05 DIAGNOSIS — Z801 Family history of malignant neoplasm of trachea, bronchus and lung: Secondary | ICD-10-CM

## 2020-03-05 DIAGNOSIS — D6489 Other specified anemias: Secondary | ICD-10-CM | POA: Diagnosis not present

## 2020-03-05 DIAGNOSIS — N390 Urinary tract infection, site not specified: Secondary | ICD-10-CM | POA: Diagnosis not present

## 2020-03-05 DIAGNOSIS — Z87891 Personal history of nicotine dependence: Secondary | ICD-10-CM

## 2020-03-05 DIAGNOSIS — R112 Nausea with vomiting, unspecified: Secondary | ICD-10-CM

## 2020-03-05 DIAGNOSIS — R140 Abdominal distension (gaseous): Secondary | ICD-10-CM

## 2020-03-05 DIAGNOSIS — J189 Pneumonia, unspecified organism: Secondary | ICD-10-CM

## 2020-03-05 DIAGNOSIS — E1165 Type 2 diabetes mellitus with hyperglycemia: Secondary | ICD-10-CM | POA: Diagnosis present

## 2020-03-05 DIAGNOSIS — L8990 Pressure ulcer of unspecified site, unspecified stage: Secondary | ICD-10-CM | POA: Insufficient documentation

## 2020-03-05 DIAGNOSIS — I2510 Atherosclerotic heart disease of native coronary artery without angina pectoris: Secondary | ICD-10-CM | POA: Diagnosis present

## 2020-03-05 DIAGNOSIS — Z888 Allergy status to other drugs, medicaments and biological substances status: Secondary | ICD-10-CM

## 2020-03-05 DIAGNOSIS — Z781 Physical restraint status: Secondary | ICD-10-CM

## 2020-03-05 DIAGNOSIS — Z7984 Long term (current) use of oral hypoglycemic drugs: Secondary | ICD-10-CM

## 2020-03-05 DIAGNOSIS — A0839 Other viral enteritis: Secondary | ICD-10-CM | POA: Diagnosis present

## 2020-03-05 DIAGNOSIS — Z452 Encounter for adjustment and management of vascular access device: Secondary | ICD-10-CM

## 2020-03-05 DIAGNOSIS — Z833 Family history of diabetes mellitus: Secondary | ICD-10-CM

## 2020-03-05 DIAGNOSIS — Z978 Presence of other specified devices: Secondary | ICD-10-CM

## 2020-03-05 DIAGNOSIS — I110 Hypertensive heart disease with heart failure: Secondary | ICD-10-CM | POA: Diagnosis present

## 2020-03-05 DIAGNOSIS — Z955 Presence of coronary angioplasty implant and graft: Secondary | ICD-10-CM

## 2020-03-05 DIAGNOSIS — E869 Volume depletion, unspecified: Secondary | ICD-10-CM | POA: Diagnosis not present

## 2020-03-05 DIAGNOSIS — A4189 Other specified sepsis: Secondary | ICD-10-CM | POA: Diagnosis not present

## 2020-03-05 DIAGNOSIS — R6521 Severe sepsis with septic shock: Secondary | ICD-10-CM | POA: Diagnosis not present

## 2020-03-05 DIAGNOSIS — Z87442 Personal history of urinary calculi: Secondary | ICD-10-CM

## 2020-03-05 DIAGNOSIS — Z66 Do not resuscitate: Secondary | ICD-10-CM | POA: Diagnosis not present

## 2020-03-05 DIAGNOSIS — R0602 Shortness of breath: Secondary | ICD-10-CM

## 2020-03-05 DIAGNOSIS — Z881 Allergy status to other antibiotic agents status: Secondary | ICD-10-CM

## 2020-03-05 DIAGNOSIS — E11649 Type 2 diabetes mellitus with hypoglycemia without coma: Secondary | ICD-10-CM | POA: Diagnosis not present

## 2020-03-05 DIAGNOSIS — E8779 Other fluid overload: Secondary | ICD-10-CM

## 2020-03-05 DIAGNOSIS — E876 Hypokalemia: Secondary | ICD-10-CM | POA: Diagnosis not present

## 2020-03-05 DIAGNOSIS — K5651 Intestinal adhesions [bands], with partial obstruction: Secondary | ICD-10-CM | POA: Diagnosis present

## 2020-03-05 DIAGNOSIS — I252 Old myocardial infarction: Secondary | ICD-10-CM

## 2020-03-05 DIAGNOSIS — J80 Acute respiratory distress syndrome: Secondary | ICD-10-CM | POA: Diagnosis not present

## 2020-03-05 DIAGNOSIS — J155 Pneumonia due to Escherichia coli: Secondary | ICD-10-CM | POA: Diagnosis not present

## 2020-03-05 DIAGNOSIS — D751 Secondary polycythemia: Secondary | ICD-10-CM | POA: Diagnosis present

## 2020-03-05 DIAGNOSIS — J9600 Acute respiratory failure, unspecified whether with hypoxia or hypercapnia: Secondary | ICD-10-CM

## 2020-03-05 DIAGNOSIS — Z7902 Long term (current) use of antithrombotics/antiplatelets: Secondary | ICD-10-CM

## 2020-03-05 DIAGNOSIS — Y95 Nosocomial condition: Secondary | ICD-10-CM | POA: Diagnosis not present

## 2020-03-05 DIAGNOSIS — K56600 Partial intestinal obstruction, unspecified as to cause: Secondary | ICD-10-CM

## 2020-03-05 DIAGNOSIS — Z885 Allergy status to narcotic agent status: Secondary | ICD-10-CM

## 2020-03-05 DIAGNOSIS — L89151 Pressure ulcer of sacral region, stage 1: Secondary | ICD-10-CM | POA: Diagnosis not present

## 2020-03-05 DIAGNOSIS — D497 Neoplasm of unspecified behavior of endocrine glands and other parts of nervous system: Secondary | ICD-10-CM | POA: Diagnosis present

## 2020-03-05 DIAGNOSIS — E871 Hypo-osmolality and hyponatremia: Secondary | ICD-10-CM

## 2020-03-05 DIAGNOSIS — I472 Ventricular tachycardia: Secondary | ICD-10-CM | POA: Diagnosis not present

## 2020-03-05 DIAGNOSIS — R579 Shock, unspecified: Secondary | ICD-10-CM

## 2020-03-05 LAB — LIPASE, BLOOD: Lipase: 21 U/L (ref 11–51)

## 2020-03-05 LAB — COMPREHENSIVE METABOLIC PANEL
ALT: 29 U/L (ref 0–44)
AST: 26 U/L (ref 15–41)
Albumin: 5 g/dL (ref 3.5–5.0)
Alkaline Phosphatase: 53 U/L (ref 38–126)
Anion gap: 10 (ref 5–15)
BUN: 14 mg/dL (ref 8–23)
CO2: 24 mmol/L (ref 22–32)
Calcium: 9.7 mg/dL (ref 8.9–10.3)
Chloride: 101 mmol/L (ref 98–111)
Creatinine, Ser: 0.81 mg/dL (ref 0.61–1.24)
GFR, Estimated: >60 mL/min (ref >60)
Glucose, Bld: 232 mg/dL — ABNORMAL HIGH (ref 70–99)
Potassium: 4.1 mmol/L (ref 3.5–5.1)
Sodium: 135 mmol/L (ref 135–145)
Total Bilirubin: 1.4 mg/dL — ABNORMAL HIGH (ref 0.3–1.2)
Total Protein: 8.2 g/dL — ABNORMAL HIGH (ref 6.5–8.1)

## 2020-03-05 LAB — CBC WITH DIFFERENTIAL/PLATELET
Abs Immature Granulocytes: 0.01 10*3/uL (ref 0.00–0.07)
Basophils Absolute: 0.1 10*3/uL (ref 0.0–0.1)
Basophils Relative: 1 %
Eosinophils Absolute: 0.1 10*3/uL (ref 0.0–0.5)
Eosinophils Relative: 2 %
HCT: 50.5 % (ref 39.0–52.0)
Hemoglobin: 17.7 g/dL — ABNORMAL HIGH (ref 13.0–17.0)
Immature Granulocytes: 0 %
Lymphocytes Relative: 13 %
Lymphs Abs: 0.9 10*3/uL (ref 0.7–4.0)
MCH: 31.6 pg (ref 26.0–34.0)
MCHC: 35 g/dL (ref 30.0–36.0)
MCV: 90.2 fL (ref 80.0–100.0)
Monocytes Absolute: 0.7 10*3/uL (ref 0.1–1.0)
Monocytes Relative: 9 %
Neutro Abs: 5.3 10*3/uL (ref 1.7–7.7)
Neutrophils Relative %: 75 %
Platelets: 174 10*3/uL (ref 150–400)
RBC: 5.6 MIL/uL (ref 4.22–5.81)
RDW: 12 % (ref 11.5–15.5)
WBC: 7.1 10*3/uL (ref 4.0–10.5)
nRBC: 0 % (ref 0.0–0.2)

## 2020-03-05 MED ORDER — IOHEXOL 300 MG/ML  SOLN
100.0000 mL | Freq: Once | INTRAMUSCULAR | Status: AC | PRN
  Administered 2020-03-06: 100 mL via INTRAVENOUS

## 2020-03-05 MED ORDER — ONDANSETRON HCL 4 MG/2ML IJ SOLN
4.0000 mg | Freq: Once | INTRAMUSCULAR | Status: AC
  Administered 2020-03-05: 4 mg via INTRAVENOUS
  Filled 2020-03-05: qty 2

## 2020-03-05 MED ORDER — FENTANYL CITRATE (PF) 100 MCG/2ML IJ SOLN
100.0000 ug | Freq: Once | INTRAMUSCULAR | Status: AC
  Administered 2020-03-05: 100 ug via INTRAVENOUS
  Filled 2020-03-05: qty 2

## 2020-03-05 MED ORDER — SODIUM CHLORIDE 0.9 % IV BOLUS
1000.0000 mL | Freq: Once | INTRAVENOUS | Status: AC
  Administered 2020-03-05: 1000 mL via INTRAVENOUS

## 2020-03-05 NOTE — ED Triage Notes (Signed)
Pt c/o abdominal pain with N/V/D since yesterday.  Pt states he has had problems with bowel obstructions in the past.

## 2020-03-05 NOTE — ED Provider Notes (Signed)
Emergency Department Provider Note   I have reviewed the triage vital signs and the nursing notes.   HISTORY  Chief Complaint Abdominal Pain   HPI Calvin Cruz is a 60 y.o. male presents to the emergency department for evaluation of abdominal pain with vomiting.  Patient having very little passage of stool or flatus and states pain feels similar to his prior bowel obstructions.  He has had 3 prior obstructions but tells me that none of them have required surgery.  He has had pain for the past  24 hours.  Denies any fevers or chills.  No chest pain or shortness of breath.  No URI symptoms.  Pain is moderate to severe worse with eating.   Past Medical History:  Diagnosis Date  . CAD S/P DES PCI-circumflex 07/22/2014   PCI mCx: PCI: 2.75 x 18 mm Xience Alpine DES to mid LCx at takeoff of OM1 (OM1 jailed 50% stenosis)  . Diabetes ([place])   . Erythrocytosis 11/28/2012  . Essential hypertension   . Fatigue 11/28/2012  . Gout 2014  . Hyperlipidemia with target LDL less than 70   . Hypertriglyceridemia 11/16/2014  . Kidney stones   . Non-STEMI (non-ST elevated myocardial infarction) ([place]) 07/22/2014   a) RCA: OK, LM: Okay ; LAD: Proximal to mid 40%; RI: 30%;LCx: Mid to distal 90%, distal 20%, OM1 50% --> DES PCI  . Ulcerative colitis    s/p ilioanal anastomosis (colectomy) ; has had multiple episodes of pouchitis.    Patient Active Problem List   Diagnosis Date Noted  . Partial small bowel obstruction ([place]) 03/06/2020  . Abdominal pain 03/06/2020  . COVID-19 virus infection 03/06/2020  . Hyperglycemia due to diabetes mellitus ([place]) 03/06/2020  . Nausea and vomiting 03/06/2020  . Gastroenteritis due to COVID-19 virus 03/06/2020  . Right buttock pain 10/24/2019  . Esophageal dysphagia 09/17/2017  . Polycythemia vera ([place]) 09/09/2016  . Enterocolitis 04/20/2015  . Hypertriglyceridemia 11/16/2014  . Hyperlipidemia with target LDL less than 70   . Essential hypertension   .  NSTEMI (non-ST elevated myocardial infarction) ([place]) 07/22/2014  . Ulcerative colitis ([place]) 07/22/2014  . CAD S/P DES PCI-circumflex 07/22/2014  . Pouchitis ([place]) 12/22/2012  . Ventral hernia 12/22/2012  . Hypogonadism male 12/22/2012  . Fatigue 11/28/2012  . Erythrocytosis 11/28/2012  . H/O resection of large bowel 11/28/2012    Past Surgical History:  Procedure Laterality Date  . APPENDECTOMY    . CARDIAC CATHETERIZATION N/A 07/23/2014   Procedure: Left Heart Cath and Coronary Angiography;  Surgeon: Leonie Man, MD;  [facility];  Service: Cardiovascular;  RCA: Mild disease; LM: Okay ; LAD: Proximal to mid 40%; RI: 30%;LCx: Mid to distal 90%, distal 20%, OM1 50%; Mid inferolateral HK, EF 55-65%   . CARDIAC CATHETERIZATION N/A 07/23/2014   Procedure: Coronary Stent Intervention;  Surgeon: Leonie Man, MD;  Location: [facility];  Service: Cardiovascular; PCI: 2.75 x 18 mm Xience Alpine DES to mid LCx at takeoff of OM1 (OM1 jailed 50% stenosis)  . CHOLECYSTECTOMY    . COLECTOMY    . HERNIA REPAIR     umbilical  . [place]oanalanastomosis  04/2001, 11/2001, 03/2202   has a J pouch  . TRANSTHORACIC ECHOCARDIOGRAM  07/22/2014   EF 55-60%, normal wall motion, normal diastolic function,  no valve lesions     Allergies Metronidazole, Ciprofloxacin, Dexilant [dexlansoprazole], Morphine and related, and Doxycycline  Family History  Problem Relation Age of Onset  .  Diabetes Mother   . Coronary artery disease Mother   . Cancer Father 80       lung, smoker  . Diabetes Brother   . Colon cancer Neg Hx   . Esophageal cancer Neg Hx   . Rectal cancer Neg Hx     Social History Social History   Tobacco Use  . Smoking status: Former Smoker    Packs/day: 1.00    Years: 25.00    Pack years: 25.00    Types: Cigarettes    Quit date: 12/22/2001    Years since quitting: 18.2  . Smokeless tobacco: Never Used  Vaping Use  . Vaping Use: Never used  Substance Use  Topics  . Alcohol use: No    Alcohol/week: 0.0 standard drinks  . Drug use: No    Review of Systems  Constitutional: No fever/chills Eyes: No visual changes. ENT: No sore throat. Cardiovascular: Denies chest pain. Respiratory: Denies shortness of breath. Gastrointestinal: Positive abdominal pain. Positive nausea and vomiting.  No diarrhea.  No constipation. Genitourinary: Negative for dysuria. Musculoskeletal: Negative for back pain. Skin: Negative for rash. Neurological: Negative for headaches, focal weakness or numbness.  10-point ROS otherwise negative.  ____________________________________________   PHYSICAL EXAM:  VITAL SIGNS: ED Triage Vitals  Enc Vitals Group     BP 03/07/2020 2212 137/86     Pulse Rate 02/28/2020 2212 (!) 112     Resp 03/04/2020 2212 16     Temp 03/15/2020 2212 98.1 F (36.7 C)     Temp Source 03/07/2020 2212 Oral     SpO2 03/12/2020 2212 96 %     Weight 03/19/2020 2213 240 lb (108.9 kg)     Height 03/12/2020 2213 6\' 6"  (1.981 m)   Constitutional: Alert and oriented. Well appearing and in no acute distress. Eyes: Conjunctivae are normal.  Head: Atraumatic. Nose: No congestion/rhinnorhea. Mouth/Throat: Mucous membranes are moist.   Neck: No stridor.   Cardiovascular: Normal rate, regular rhythm. Good peripheral circulation. Grossly normal heart sounds.   Respiratory: Normal respiratory effort.  No retractions. Lungs CTAB. Gastrointestinal: Soft with diffuse tenderness. No rebound or guarding. No distention.  Musculoskeletal: No lower extremity tenderness nor edema. No gross deformities of extremities. Neurologic:  Normal speech and language. No gross focal neurologic deficits are appreciated.  Skin:  Skin is warm, dry and intact. No rash noted.   ____________________________________________   LABS (all labs ordered are listed, but only abnormal results are displayed)  Labs Reviewed  RESP PANEL BY RT-PCR (FLU A&B, COVID) ARPGX2 - Abnormal; Notable for  the following components:      Result Value   SARS Coronavirus 2 by RT PCR POSITIVE (*)    All other components within normal limits  COMPREHENSIVE METABOLIC PANEL - Abnormal; Notable for the following components:   Glucose, Bld 232 (*)    Total Protein 8.2 (*)    Total Bilirubin 1.4 (*)    All other components within normal limits  CBC WITH DIFFERENTIAL/PLATELET - Abnormal; Notable for the following components:   Hemoglobin 17.7 (*)    All other components within normal limits  HEMOGLOBIN A1C - Abnormal; Notable for the following components:   Hgb A1c MFr Bld 6.8 (*)    All other components within normal limits  CBG MONITORING, ED - Abnormal; Notable for the following components:   Glucose-Capillary 194 (*)    All other components within normal limits  CBG MONITORING, ED - Abnormal; Notable for the following components:   Glucose-Capillary 189 (*)  All other components within normal limits  LIPASE, BLOOD  HIV ANTIBODY (ROUTINE TESTING W REFLEX)   ____________________________________________  RADIOLOGY  CT ABDOMEN PELVIS W CONTRAST  Result Date: 03/06/2020 CLINICAL DATA:  Abdominal pain EXAM: CT ABDOMEN AND PELVIS WITH CONTRAST TECHNIQUE: Multidetector CT imaging of the abdomen and pelvis was performed using the standard protocol following bolus administration of intravenous contrast. CONTRAST:  168mL OMNIPAQUE IOHEXOL 300 MG/ML  SOLN COMPARISON:  April 04, 2019 FINDINGS: Lower chest: The visualized heart size within normal limits. No pericardial fluid/thickening. No hiatal hernia. The visualized portions of the lungs are clear. Hepatobiliary: The liver is normal in density without focal abnormality.The main portal vein is patent. The patient is status post cholecystectomy. No biliary ductal dilation. Pancreas: Unremarkable. No pancreatic ductal dilatation or surrounding inflammatory changes. Spleen: Normal in size without focal abnormality. Adrenals/Urinary Tract: Both adrenal  glands appear normal. The kidneys and collecting system appear normal without evidence of urinary tract calculus or hydronephrosis. Bladder is unremarkable. Stomach/Bowel: The stomach and proximal small bowel are unremarkable. There are mildly dilated air and fluid-filled loops of ileum measuring up to 3.2 cm. No clear transition point or focal wall narrowing is noted. No surrounding inflammatory changes are seen. There is been a prior colectomy with an ileocolonic anastomosis within the deep pelvis. Vascular/Lymphatic: There are no enlarged mesenteric, retroperitoneal, or pelvic lymph nodes. Scattered aortic atherosclerotic calcifications are seen without aneurysmal dilatation. Reproductive: The prostate is unremarkable. Other: No evidence of abdominal wall mass or hernia. Musculoskeletal: No acute or significant osseous findings. IMPRESSION: Mildly dilated fluid and air-filled ileal loops without a clear transition point. This is likely due to a partial small bowel obstruction. Prior colectomy with anastomosis within the deep pelvis. Aortic Atherosclerosis (ICD10-I70.0). Electronically Signed   By: Prudencio Pair M.D.   On: 03/06/2020 00:46    ____________________________________________   PROCEDURES  Procedure(s) performed:   Procedures  None  ____________________________________________   INITIAL IMPRESSION / ASSESSMENT AND PLAN / ED COURSE  Pertinent labs & imaging results that were available during my care of the patient were reviewed by me and considered in my medical decision making (see chart for details).   Patient presents emergency department for evaluation of pain.  Presentation is most consistent with bowel obstruction.  Labs doubt acute chest pain or shortness of breath.  No URI or COVID symptoms.  Plan for pain/nausea medication along with IV fluids and will follow CT scan.  Care transferred to Dr. Wynona Canes pending CT and disposition.    ____________________________________________  FINAL CLINICAL IMPRESSION(S) / ED DIAGNOSES  Final diagnoses:  Partial small bowel obstruction (HCC)  COVID-19 virus infection     MEDICATIONS GIVEN DURING THIS VISIT:  Medications  HYDROmorphone (DILAUDID) injection 1 mg (1 mg Intravenous Given 03/06/20 1510)  ondansetron (ZOFRAN) injection 4 mg (4 mg Intravenous Not Given 03/06/20 1423)  lactated ringers infusion ( Intravenous New Bag/Given 03/06/20 0749)  enoxaparin (LOVENOX) injection 40 mg (has no administration in time range)  promethazine (PHENERGAN) injection 12.5 mg (12.5 mg Intravenous Given 03/06/20 1524)  insulin aspart (novoLOG) injection 0-9 Units (0 Units Subcutaneous Not Given 03/06/20 1137)  pantoprazole (PROTONIX) injection 40 mg (40 mg Intravenous Given 03/06/20 1024)  sodium chloride 0.9 % bolus 1,000 mL (0 mLs Intravenous Stopped 03/06/20 0000)  ondansetron (ZOFRAN) injection 4 mg (4 mg Intravenous Given 03/10/2020 2244)  fentaNYL (SUBLIMAZE) injection 100 mcg (100 mcg Intravenous Given 03/16/2020 2244)  iohexol (OMNIPAQUE) 300 MG/ML solution 100 mL (100 mLs Intravenous Contrast  Given 03/06/20 0013)  fentaNYL (SUBLIMAZE) injection 100 mcg (100 mcg Intravenous Given 03/06/20 0135)  0.9 %  sodium chloride infusion ( Intravenous Stopped 03/06/20 0748)    Note:  This document was prepared using Dragon voice recognition software and may include unintentional dictation errors.  Nanda Quinton, MD, [facility]    Elianne Gubser, [facility], MD 03/06/20 [phone]

## 2020-03-06 DIAGNOSIS — R17 Unspecified jaundice: Secondary | ICD-10-CM | POA: Diagnosis not present

## 2020-03-06 DIAGNOSIS — I5022 Chronic systolic (congestive) heart failure: Secondary | ICD-10-CM | POA: Diagnosis present

## 2020-03-06 DIAGNOSIS — K567 Ileus, unspecified: Secondary | ICD-10-CM | POA: Diagnosis not present

## 2020-03-06 DIAGNOSIS — A0839 Other viral enteritis: Secondary | ICD-10-CM

## 2020-03-06 DIAGNOSIS — I4891 Unspecified atrial fibrillation: Secondary | ICD-10-CM | POA: Diagnosis not present

## 2020-03-06 DIAGNOSIS — R7881 Bacteremia: Secondary | ICD-10-CM | POA: Diagnosis not present

## 2020-03-06 DIAGNOSIS — R6521 Severe sepsis with septic shock: Secondary | ICD-10-CM | POA: Diagnosis not present

## 2020-03-06 DIAGNOSIS — J80 Acute respiratory distress syndrome: Secondary | ICD-10-CM | POA: Diagnosis not present

## 2020-03-06 DIAGNOSIS — I110 Hypertensive heart disease with heart failure: Secondary | ICD-10-CM | POA: Diagnosis present

## 2020-03-06 DIAGNOSIS — R109 Unspecified abdominal pain: Secondary | ICD-10-CM | POA: Diagnosis present

## 2020-03-06 DIAGNOSIS — Z515 Encounter for palliative care: Secondary | ICD-10-CM | POA: Diagnosis not present

## 2020-03-06 DIAGNOSIS — E1165 Type 2 diabetes mellitus with hyperglycemia: Secondary | ICD-10-CM

## 2020-03-06 DIAGNOSIS — E781 Pure hyperglyceridemia: Secondary | ICD-10-CM | POA: Diagnosis present

## 2020-03-06 DIAGNOSIS — E785 Hyperlipidemia, unspecified: Secondary | ICD-10-CM

## 2020-03-06 DIAGNOSIS — A4189 Other specified sepsis: Secondary | ICD-10-CM | POA: Diagnosis not present

## 2020-03-06 DIAGNOSIS — G9341 Metabolic encephalopathy: Secondary | ICD-10-CM | POA: Diagnosis not present

## 2020-03-06 DIAGNOSIS — T797XXA Traumatic subcutaneous emphysema, initial encounter: Secondary | ICD-10-CM | POA: Diagnosis not present

## 2020-03-06 DIAGNOSIS — Y95 Nosocomial condition: Secondary | ICD-10-CM | POA: Diagnosis not present

## 2020-03-06 DIAGNOSIS — E222 Syndrome of inappropriate secretion of antidiuretic hormone: Secondary | ICD-10-CM | POA: Diagnosis not present

## 2020-03-06 DIAGNOSIS — J155 Pneumonia due to Escherichia coli: Secondary | ICD-10-CM | POA: Diagnosis not present

## 2020-03-06 DIAGNOSIS — R579 Shock, unspecified: Secondary | ICD-10-CM | POA: Diagnosis not present

## 2020-03-06 DIAGNOSIS — R1030 Lower abdominal pain, unspecified: Secondary | ICD-10-CM | POA: Diagnosis not present

## 2020-03-06 DIAGNOSIS — U071 COVID-19: Secondary | ICD-10-CM

## 2020-03-06 DIAGNOSIS — N390 Urinary tract infection, site not specified: Secondary | ICD-10-CM | POA: Diagnosis not present

## 2020-03-06 DIAGNOSIS — I2510 Atherosclerotic heart disease of native coronary artery without angina pectoris: Secondary | ICD-10-CM

## 2020-03-06 DIAGNOSIS — I472 Ventricular tachycardia: Secondary | ICD-10-CM | POA: Diagnosis not present

## 2020-03-06 DIAGNOSIS — R0902 Hypoxemia: Secondary | ICD-10-CM | POA: Diagnosis not present

## 2020-03-06 DIAGNOSIS — J1282 Pneumonia due to coronavirus disease 2019: Secondary | ICD-10-CM | POA: Diagnosis not present

## 2020-03-06 DIAGNOSIS — K56600 Partial intestinal obstruction, unspecified as to cause: Secondary | ICD-10-CM | POA: Diagnosis not present

## 2020-03-06 DIAGNOSIS — Z9861 Coronary angioplasty status: Secondary | ICD-10-CM | POA: Diagnosis not present

## 2020-03-06 DIAGNOSIS — I10 Essential (primary) hypertension: Secondary | ICD-10-CM

## 2020-03-06 DIAGNOSIS — R112 Nausea with vomiting, unspecified: Secondary | ICD-10-CM

## 2020-03-06 DIAGNOSIS — K5651 Intestinal adhesions [bands], with partial obstruction: Secondary | ICD-10-CM | POA: Diagnosis present

## 2020-03-06 DIAGNOSIS — R140 Abdominal distension (gaseous): Secondary | ICD-10-CM | POA: Diagnosis not present

## 2020-03-06 DIAGNOSIS — J982 Interstitial emphysema: Secondary | ICD-10-CM | POA: Diagnosis not present

## 2020-03-06 DIAGNOSIS — I351 Nonrheumatic aortic (valve) insufficiency: Secondary | ICD-10-CM | POA: Diagnosis not present

## 2020-03-06 DIAGNOSIS — I471 Supraventricular tachycardia: Secondary | ICD-10-CM | POA: Diagnosis not present

## 2020-03-06 DIAGNOSIS — R9431 Abnormal electrocardiogram [ECG] [EKG]: Secondary | ICD-10-CM | POA: Diagnosis not present

## 2020-03-06 DIAGNOSIS — J9601 Acute respiratory failure with hypoxia: Secondary | ICD-10-CM | POA: Diagnosis not present

## 2020-03-06 DIAGNOSIS — Z8719 Personal history of other diseases of the digestive system: Secondary | ICD-10-CM | POA: Diagnosis not present

## 2020-03-06 DIAGNOSIS — Z978 Presence of other specified devices: Secondary | ICD-10-CM | POA: Diagnosis not present

## 2020-03-06 DIAGNOSIS — Z66 Do not resuscitate: Secondary | ICD-10-CM | POA: Diagnosis not present

## 2020-03-06 DIAGNOSIS — K56609 Unspecified intestinal obstruction, unspecified as to partial versus complete obstruction: Secondary | ICD-10-CM

## 2020-03-06 DIAGNOSIS — I4892 Unspecified atrial flutter: Secondary | ICD-10-CM | POA: Diagnosis not present

## 2020-03-06 DIAGNOSIS — A4151 Sepsis due to Escherichia coli [E. coli]: Secondary | ICD-10-CM | POA: Diagnosis not present

## 2020-03-06 LAB — CBG MONITORING, ED
Glucose-Capillary: 194 mg/dL — ABNORMAL HIGH (ref 70–99)
Glucose-Capillary: 189 mg/dL — ABNORMAL HIGH (ref 70–99)
Glucose-Capillary: 202 mg/dL — ABNORMAL HIGH (ref 70–99)

## 2020-03-06 LAB — RESP PANEL BY RT-PCR (FLU A&B, COVID) ARPGX2
Influenza A by PCR: NEGATIVE
Influenza B by PCR: NEGATIVE
SARS Coronavirus 2 by RT PCR: POSITIVE — AB

## 2020-03-06 LAB — HEMOGLOBIN A1C
Hgb A1c MFr Bld: 6.8 % — ABNORMAL HIGH (ref 4.8–5.6)
Mean Plasma Glucose: 148.46 mg/dL

## 2020-03-06 MED ORDER — ENOXAPARIN SODIUM 40 MG/0.4ML ~~LOC~~ SOLN
40.0000 mg | SUBCUTANEOUS | Status: DC
  Administered 2020-03-06 – 2020-03-19 (×14): 40 mg via SUBCUTANEOUS
  Filled 2020-03-06 (×15): qty 0.4

## 2020-03-06 MED ORDER — INSULIN ASPART 100 UNIT/ML ~~LOC~~ SOLN: 0.0000 [IU] | Freq: Every day | SUBCUTANEOUS | Status: DC

## 2020-03-06 MED ORDER — ENOXAPARIN SODIUM 40 MG/0.4ML ~~LOC~~ SOLN
40.0000 mg | Freq: Every day | SUBCUTANEOUS | Status: DC
  Administered 2020-03-06: 40 mg via SUBCUTANEOUS
  Filled 2020-03-06: qty 0.4

## 2020-03-06 MED ORDER — PANTOPRAZOLE SODIUM 40 MG IV SOLR
40.0000 mg | Freq: Two times a day (BID) | INTRAVENOUS | Status: DC
  Administered 2020-03-06 – 2020-03-17 (×23): 40 mg via INTRAVENOUS
  Filled 2020-03-06 (×24): qty 40

## 2020-03-06 MED ORDER — FENTANYL CITRATE (PF) 100 MCG/2ML IJ SOLN
100.0000 ug | Freq: Once | INTRAMUSCULAR | Status: AC
  Administered 2020-03-06: 100 ug via INTRAVENOUS
  Filled 2020-03-06: qty 2

## 2020-03-06 MED ORDER — ONDANSETRON HCL 4 MG/2ML IJ SOLN
4.0000 mg | Freq: Four times a day (QID) | INTRAMUSCULAR | Status: DC
  Administered 2020-03-06 – 2020-03-27 (×81): 4 mg via INTRAVENOUS
  Filled 2020-03-06 (×82): qty 2

## 2020-03-06 MED ORDER — INSULIN ASPART 100 UNIT/ML ~~LOC~~ SOLN: 0.0000 [IU] | Freq: Three times a day (TID) | SUBCUTANEOUS | Status: DC

## 2020-03-06 MED ORDER — ONDANSETRON HCL 4 MG/2ML IJ SOLN
4.0000 mg | Freq: Four times a day (QID) | INTRAMUSCULAR | Status: DC | PRN
  Filled 2020-03-06: qty 2

## 2020-03-06 MED ORDER — LACTATED RINGERS IV SOLN: INTRAVENOUS | Status: DC

## 2020-03-06 MED ORDER — FENTANYL CITRATE (PF) 100 MCG/2ML IJ SOLN
50.0000 ug | INTRAMUSCULAR | Status: DC | PRN
  Administered 2020-03-06: 50 ug via INTRAVENOUS
  Filled 2020-03-06 (×2): qty 2

## 2020-03-06 MED ORDER — HYDROMORPHONE HCL 1 MG/ML IJ SOLN
1.0000 mg | INTRAMUSCULAR | Status: DC | PRN
  Administered 2020-03-06 – 2020-03-07 (×6): 1 mg via INTRAVENOUS
  Filled 2020-03-06 (×6): qty 1

## 2020-03-06 MED ORDER — SODIUM CHLORIDE 0.9 % IV SOLN: Freq: Once | INTRAVENOUS | Status: AC

## 2020-03-06 MED ORDER — INSULIN ASPART 100 UNIT/ML ~~LOC~~ SOLN
0.0000 [IU] | Freq: Three times a day (TID) | SUBCUTANEOUS | Status: DC
  Administered 2020-03-06 – 2020-03-07 (×2): 3 [IU] via SUBCUTANEOUS
  Filled 2020-03-06 (×2): qty 1

## 2020-03-06 MED ORDER — PROMETHAZINE HCL 25 MG/ML IJ SOLN
12.5000 mg | Freq: Four times a day (QID) | INTRAMUSCULAR | Status: DC | PRN
  Administered 2020-03-06 – 2020-03-21 (×12): 12.5 mg via INTRAVENOUS
  Filled 2020-03-06 (×12): qty 1

## 2020-03-06 NOTE — Progress Notes (Addendum)
PROGRESS NOTE  Calvin Cruz [id number] DOB: [birth year] DOA: 02/28/2020 PCP: Reynold Bowen, MD  Brief History:  60 year old male with a history of secondary polycythemia (JAK2 neg) , hyperlipidemia, diabetes mellitus type 2, hypertension, coronary artery disease status post NSTEMI with DES to the circumflex June 2016, ulcerative colitis status post proctocolectomy with ileoanal anastomosis, and recurrent episodes of pouchitis presenting with upper abdominal pain, nausea, vomiting, and loose stools that began on 03/04/2020. He states that he normally has 5-6 loose bowel movements on average day secondary to his ulcerative colitis.  He normally uses Imodium, usually 1 to 2 tablets on a daily basis.  His last bowel movement was on 03/09/2020.  He denies any fevers, chills, chest pain, shortness of breath, cough, hemoptysis.  He denies any headache, sore throat, hemoptysis.  He has had numerous episodes of emesis on 03/09/2020 resulting in dry heaving.  There is no hematemesis.  He denies hematochezia or melena. He has not had a bowel movement since the afternoon of 03/12/2020.  In the emergency department, the patient was afebrile hemodynamically stable with oxygen saturation 94-96% on room air.  BMP showed a sodium 135, potassium 4.1, BUN 14, serum creatinine 0.81.  LFTs were unremarkable.  Lipase 21.  WBC 7.1, hemoglobin 17.7, platelets 174,000. CT of the abdomen and pelvis showed mildly dilated air-fluid filled loops of ileum up to 3.2 cm.  There was no clear transition point or focal narrowing.  There is no surrounding inflammatory changes.  He is status post colectomy with ileal colonic anastomosis.  Assessment/Plan: Partial small bowel obstruction -Remain n.p.o. for now -IV fluids -Judicious opioids--switch to IV Dilaudid -Continue antiemetics around-the-clock -03/06/2020 CT abdomen/pelvis as discussed above -general surgery consult  COVID-19 gastroenteritis -Patient was found to  be COVID-19 positive by RT-PCR -?pouchitis -Continue antiemetics -Stool pathogen panel -Start PPI -Patient has not been vaccinated -GI consult  Coronary artery disease with history of NSTEMI -No chest pain presently -Restart Brilinta when able to tolerate p.o. -Patient follows with Dr. Glenetta Hew  Diabetes mellitus type 2, controlled -Restart Jardiance once able to tolerate p.o.  Hyperlipidemia -Restart Crestor and Vascepa once able to tolerate p.o.  Pituitary Tumor  -Found in 08/28/16 Brain MRI  -Will follow up with Dr. Forde Dandy.   Status is: Inpatient  Remains inpatient appropriate because:IV treatments appropriate due to intensity of illness or inability to take PO   Dispo: The patient is from: Home              Anticipated d/c is to: Home              Anticipated d/c date is: 3 days              Patient currently is not medically stable to d/c.        Family Communication:   No Family at bedside  Consultants:  GI, general surgery  Code Status:  FULL  DVT Prophylaxis:   [place] Lovenox   Procedures: As Listed in Progress Note Above  Antibiotics: None       Subjective: Patient complains of upper abd pain.  Denies f/c, cp, sob, cough.  Having dry heaving.  No BM since 02/26/2020.  No hematochezia, no melena. Objective: Vitals:   03/06/20 0300 03/06/20 0430 03/06/20 0600 03/06/20 0700  BP: 132/83 107/70 126/87 110/62  Pulse: 85 84 85 84  Resp:  18 16 18   Temp:      TempSrc:  SpO2: 96% 96% 93% 96%  Weight:      Height:        Intake/Output Summary (Last 24 hours) at 03/06/2020 0737 Last data filed at 03/06/2020 0000 Gross per 24 hour  Intake 1265.4 ml  Output --  Net 1265.4 ml   Weight change:  Exam:   General:  Pt is alert, follows commands appropriately, not in acute distress  HEENT: No icterus, No thrush, No neck mass, [place]/AT  Cardiovascular: RRR, S1/S2, no rubs, no gallops  Respiratory: CTA bilaterally, no wheezing, no crackles,  no rhonchi  Abdomen: Soft/+BS, diffuse tender, non distended, no guarding  Extremities: No edema, No lymphangitis, No petechiae, No rashes, no synovitis   Data Reviewed: I have personally reviewed following labs and imaging studies Basic Metabolic Panel: Recent Labs  Lab 2020-03-28 2240  NA 135  K 4.1  CL 101  CO2 24  GLUCOSE 232*  BUN 14  CREATININE 0.81  CALCIUM 9.7   Liver Function Tests: Recent Labs  Lab Mar 28, 2020 2240  AST 26  ALT 29  ALKPHOS 53  BILITOT 1.4*  PROT 8.2*  ALBUMIN 5.0   Recent Labs  Lab March 28, 2020 2240  LIPASE 21   No results for input(s): AMMONIA in the last 168 hours. Coagulation Profile: No results for input(s): INR, PROTIME in the last 168 hours. CBC: Recent Labs  Lab 03-28-2020 2240  WBC 7.1  NEUTROABS 5.3  HGB 17.7*  HCT 50.5  MCV 90.2  PLT 174   Cardiac Enzymes: No results for input(s): CKTOTAL, CKMB, CKMBINDEX, TROPONINI in the last 168 hours. BNP: Invalid input(s): POCBNP CBG: No results for input(s): GLUCAP in the last 168 hours. HbA1C: No results for input(s): HGBA1C in the last 72 hours. Urine analysis:    Component Value Date/Time   COLORURINE YELLOW 04/04/2019 1623   APPEARANCEUR CLEAR 04/04/2019 1623   LABSPEC 1.038 (H) 04/04/2019 1623   PHURINE 5.0 04/04/2019 1623   GLUCOSEU >=500 (A) 04/04/2019 1623   HGBUR NEGATIVE 04/04/2019 1623   BILIRUBINUR NEGATIVE 04/04/2019 1623   KETONESUR 20 (A) 04/04/2019 1623   PROTEINUR NEGATIVE 04/04/2019 1623   NITRITE NEGATIVE 04/04/2019 1623   LEUKOCYTESUR NEGATIVE 04/04/2019 1623   Sepsis Labs: @LABRCNTIP (procalcitonin:4,lacticidven:4) ) Recent Results (from the past 240 hour(s))  Resp Panel by RT-PCR (Flu A&B, Covid) Nasopharyngeal Swab     Status: Abnormal   Collection Time: 03/28/2020 10:28 PM   Specimen: Nasopharyngeal Swab; Nasopharyngeal(NP) swabs in vial transport medium  Result Value Ref Range Status   SARS Coronavirus 2 by RT PCR POSITIVE (A) NEGATIVE Final     Comment: RESULT CALLED TO, READ BACK BY AND VERIFIED WITH: M BRAIN AT 0018 ON 03/06/2020 BY JPM (NOTE) SARS-CoV-2 target nucleic acids are DETECTED.  The SARS-CoV-2 RNA is generally detectable in upper respiratory specimens during the acute phase of infection. Positive results are indicative of the presence of the identified virus, but do not rule out bacterial infection or co-infection with other pathogens not detected by the test. Clinical correlation with patient history and other diagnostic information is necessary to determine patient infection status. The expected result is Negative.  Fact Sheet for Patients: EntrepreneurPulse.com.au  Fact Sheet for Healthcare Providers: IncredibleEmployment.be  This test is not yet approved or cleared by the [place] FDA and  has been authorized for detection and/or diagnosis of SARS-CoV-2 by FDA under an Emergency Use Authorization (EUA).  This EUA will remain in effect (meaning this test can  be used) for the duration of  the COVID-19 declaration under Section 564(b)(1) of the Act, 21 U.S.C. section 360bbb-3(b)(1), unless the authorization is terminated or revoked sooner.     Influenza A by PCR NEGATIVE NEGATIVE Final   Influenza B by PCR NEGATIVE NEGATIVE Final    Comment: (NOTE) The Xpert Xpress SARS-CoV-2/FLU/RSV plus assay is intended as an aid in the diagnosis of influenza from Nasopharyngeal swab specimens and should not be used as a sole basis for treatment. Nasal washings and aspirates are unacceptable for Xpert Xpress SARS-CoV-2/FLU/RSV testing.  Fact Sheet for Patients: EntrepreneurPulse.com.au  Fact Sheet for Healthcare Providers: IncredibleEmployment.be  This test is not yet approved or cleared by the [place] FDA and has been authorized for detection and/or diagnosis of SARS-CoV-2 by FDA under an Emergency Use Authorization (EUA). This EUA  will remain in effect (meaning this test can be used) for the duration of the COVID-19 declaration under Section 564(b)(1) of the Act, 21 U.S.C. section 360bbb-3(b)(1), unless the authorization is terminated or revoked.  Performed at [facility], [address], [place], [place] [address]      Scheduled Meds: . enoxaparin (LOVENOX) injection  40 mg Subcutaneous Daily  . insulin aspart  0-15 Units Subcutaneous TID WC  . insulin aspart  0-5 Units Subcutaneous QHS  . ondansetron (ZOFRAN) IV  4 mg Intravenous Q6H   Continuous Infusions: . lactated ringers      Procedures/Studies: CT ABDOMEN PELVIS W CONTRAST  Result Date: 03/06/2020 CLINICAL DATA:  Abdominal pain EXAM: CT ABDOMEN AND PELVIS WITH CONTRAST TECHNIQUE: Multidetector CT imaging of the abdomen and pelvis was performed using the standard protocol following bolus administration of intravenous contrast. CONTRAST:  144mL OMNIPAQUE IOHEXOL 300 MG/ML  SOLN COMPARISON:  April 04, 2019 FINDINGS: Lower chest: The visualized heart size within normal limits. No pericardial fluid/thickening. No hiatal hernia. The visualized portions of the lungs are clear. Hepatobiliary: The liver is normal in density without focal abnormality.The main portal vein is patent. The patient is status post cholecystectomy. No biliary ductal dilation. Pancreas: Unremarkable. No pancreatic ductal dilatation or surrounding inflammatory changes. Spleen: Normal in size without focal abnormality. Adrenals/Urinary Tract: Both adrenal glands appear normal. The kidneys and collecting system appear normal without evidence of urinary tract calculus or hydronephrosis. Bladder is unremarkable. Stomach/Bowel: The stomach and proximal small bowel are unremarkable. There are mildly dilated air and fluid-filled loops of ileum measuring up to 3.2 cm. No clear transition point or focal wall narrowing is noted. No surrounding inflammatory changes are seen. There is been a prior  colectomy with an ileocolonic anastomosis within the deep pelvis. Vascular/Lymphatic: There are no enlarged mesenteric, retroperitoneal, or pelvic lymph nodes. Scattered aortic atherosclerotic calcifications are seen without aneurysmal dilatation. Reproductive: The prostate is unremarkable. Other: No evidence of abdominal wall mass or hernia. Musculoskeletal: No acute or significant osseous findings. IMPRESSION: Mildly dilated fluid and air-filled ileal loops without a clear transition point. This is likely due to a partial small bowel obstruction. Prior colectomy with anastomosis within the deep pelvis. Aortic Atherosclerosis (ICD10-I70.0). Electronically Signed   By: Prudencio Pair M.D.   On: 03/06/2020 00:46    Orson Eva, DO  [facility]  If 7PM-7AM, please contact night-coverage [email or web address] Password [email or web address] 03/06/2020, 7:37 AM   LOS: 0 days

## 2020-03-06 NOTE — ED Notes (Signed)
Resting quietly

## 2020-03-06 NOTE — H&P (Signed)
History and Physical  CHALES PELISSIER PXT:[id number] DOB: 1960/11/09 DOA: March 28, 2020  Referring physician: Rolland Porter, MD PCP: Reynold Bowen, MD  Patient coming from: Home  Chief Complaint: Abdominal pain  HPI: Calvin Cruz is a 60 y.o. male with medical history significant for hx of ulcerative colitis, s/p colectomy with Hartman's pouch formation,  gout, NSTEMI, CAD s/p DES June 2016, hypertension and hyperlipidemia who presents to the emergency department due to 1 day onset of lower abdominal pain with associated nausea and vomiting.  Pain was described as sharp, intermittent and rated as 7-8/10 on pain scale, he also complained of nonbloody but bilious like vomiting of several episodes since onset of symptoms.  No alleviating/aggravating factors known, symptoms are similar to prior small bowel obstruction (last SBO was about a year ago per patient).  Last bowel movement was yesterday (1/15) in the morning.  He denies fever, chills, chest pain, shortness of breath, diarrhea or any sick contact. Patient states that he never had COVID-vaccine  ED Course: In the emergency department, he was hemodynamically stable.  Work-up in the ED showed normal CBC and BMP except for increased hemoglobin (17.7-chronic) and hyperglycemia.  SARS coronavirus 2 was positive. CT abdomen and pelvis with contrast showed mildly dilated fluid and air filled ileal loops without a clear transition point suspected to be due to partial small bowel obstruction. IV fentanyl was given due to abdominal pain.  IV Zofran was given due to vomiting and patient was provided with IV hydration.  NG tube was attempted, but patient refused at this time Hospitalist was asked to admit patient for further evaluation and management.  Review of Systems: Constitutional: Negative for chills and fever.  HENT: Negative for ear pain and sore throat.   Eyes: Negative for pain and visual disturbance.  Respiratory: Negative for cough, chest  tightness and shortness of breath.   Cardiovascular: Negative for chest pain and palpitations.  Gastrointestinal: Positive for abdominal pain, nausea and vomiting.  Endocrine: Negative for polyphagia and polyuria.  Genitourinary: Negative for decreased urine volume, dysuria Musculoskeletal: Negative for arthralgias and back pain.  Skin: Negative for color change and rash.  Allergic/Immunologic: Negative for immunocompromised state.  Neurological: Negative for tremors, syncope, speech difficulty, weakness, light-headedness and headaches.  Hematological: Does not bruise/bleed easily.  All other systems reviewed and are negative  Past Medical History:  Diagnosis Date  . CAD S/P DES PCI-circumflex 07/22/2014   PCI mCx: PCI: 2.75 x 18 mm Xience Alpine DES to mid LCx at takeoff of OM1 (OM1 jailed 50% stenosis)  . Diabetes ([place])   . Erythrocytosis 11/28/2012  . Essential hypertension   . Fatigue 11/28/2012  . Gout 2014  . Hyperlipidemia with target LDL less than 70   . Hypertriglyceridemia 11/16/2014  . Kidney stones   . Non-STEMI (non-ST elevated myocardial infarction) ([place]) 07/22/2014   a) RCA: OK, LM: Okay ; LAD: Proximal to mid 40%; RI: 30%;LCx: Mid to distal 90%, distal 20%, OM1 50% --> DES PCI  . Ulcerative colitis    s/p ilioanal anastomosis (colectomy) ; has had multiple episodes of pouchitis.   Past Surgical History:  Procedure Laterality Date  . APPENDECTOMY    . CARDIAC CATHETERIZATION N/A 07/23/2014   Procedure: Left Heart Cath and Coronary Angiography;  Surgeon: Leonie Man, MD;  Location: [facility];  Service: Cardiovascular;  RCA: Mild disease; LM: Okay ; LAD: Proximal to mid 40%; RI: 30%;LCx: Mid to distal 90%, distal 20%, OM1 50%; Mid inferolateral HK,  EF 55-65%   . CARDIAC CATHETERIZATION N/A 07/23/2014   Procedure: Coronary Stent Intervention;  Surgeon: Leonie Man, MD;  Location: [facility];  Service: Cardiovascular; PCI: 2.75 x 18 mm Xience Alpine DES  to mid LCx at takeoff of OM1 (OM1 jailed 50% stenosis)  . CHOLECYSTECTOMY    . COLECTOMY    . HERNIA REPAIR     umbilical  . illeoanalanastomosis  04/2001, 11/2001, 03/2202   has a J pouch  . TRANSTHORACIC ECHOCARDIOGRAM  07/22/2014   EF 55-60%, normal wall motion, normal diastolic function,  no valve lesions     Social History:  reports that he quit smoking about 18 years ago. His smoking use included cigarettes. He has a 25.00 pack-year smoking history. He has never used smokeless tobacco. He reports that he does not drink alcohol and does not use drugs.   Allergies  Allergen Reactions  . Metronidazole Itching    Unknown  . Ciprofloxacin Itching and Nausea And Vomiting  . Dexilant [Dexlansoprazole] Diarrhea  . Morphine And Related Itching  . Doxycycline Itching, Other (See Comments) and Rash    Redness of skin, burning sensation Unknown     Family History  Problem Relation Age of Onset  . Diabetes Mother   . Coronary artery disease Mother   . Cancer Father 38       lung, smoker  . Diabetes Brother   . Colon cancer Neg Hx   . Esophageal cancer Neg Hx   . Rectal cancer Neg Hx      Prior to Admission medications   Medication Sig Start Date End Date Taking? Authorizing Provider  acetaminophen (TYLENOL) 325 MG tablet Take 650 mg by mouth every 6 (six) hours as needed for mild pain.    [provider]  BRILINTA 60 MG TABS tablet Take 1 tablet by mouth twice daily 03/03/20   Leonie Man, MD  Cholecalciferol (HM VITAMIN D3) 4000 units CAPS Take 1 capsule by mouth every morning.     [provider]  Coenzyme Q10 (COQ10) 100 MG CAPS Take 100 mg by mouth daily. GRADUALLY INCREASE TO 300 MG DAILY Patient taking differently: Take 100 mg by mouth every morning.  11/17/14   Leonie Man, MD  colchicine 0.6 MG tablet Take 0.6 mg by mouth daily as needed (for gout flare).  11/04/15   [provider]  diclofenac sodium (VOLTAREN) 1 % GEL Apply 2 g  topically daily as needed (for pain).  09/14/15   [provider]  diphenoxylate-atropine (LOMOTIL) 2.5-0.025 MG tablet Take 1 tablet by mouth 4 (four) times daily as needed for diarrhea or loose stools. Reported on 09/09/2015 Patient taking differently: Take 2 tablets by mouth in the morning and at bedtime. *May also take one table as needed for diarrhea/loose stools 11/22/15   Rehman, Mechele Dawley, MD  empagliflozin (JARDIANCE) 10 MG TABS tablet Take 10 mg by mouth every morning.     [provider]  ezetimibe (ZETIA) 10 MG tablet Take 1 tablet (10 mg total) by mouth daily. 08/27/16   Leonie Man, MD  gabapentin (NEURONTIN) 600 MG tablet Take 1 tablet (600 mg total) by mouth at bedtime. 02/05/20   Dickie La, MD  Lactobacillus (PROBIOTIC ACIDOPHILUS) CAPS Take 2 capsules by mouth daily.    [provider]  levofloxacin (LEVAQUIN) 500 MG tablet Take 500 mg by mouth daily. 04/03/19   [provider]  metoprolol succinate (TOPROL-XL) 25 MG 24 hr tablet  Take 12.5 mg by mouth in the morning and at bedtime.  03/19/19   [provider]  nitroGLYCERIN (NITROSTAT) 0.4 MG SL tablet Place 1 tablet (0.4 mg total) under the tongue every 5 (five) minutes x 3 doses as needed for chest pain. 10/11/17   Duke, Tami Lin, PA  omeprazole (PRILOSEC OTC) 20 MG tablet Take 20 mg by mouth daily as needed (for acid reflux).     [provider]  ondansetron (ZOFRAN ODT) 4 MG disintegrating tablet Take 1 tablet (4 mg total) by mouth every 8 (eight) hours as needed for nausea or vomiting. 04/04/19   Henderly, Britni A, PA-C  OVER THE COUNTER MEDICATION Take 2 tablets by mouth daily. Patient takes 2 by mouth daily - Vita - Sprout Capsules - Multi Vitamin     [provider]  rifaximin (XIFAXAN) 550 MG TABS tablet Take 1 tablet (550 mg total) by mouth 2 (two) times daily. Reported on 05/17/2015 04/16/18   Jerene Bears, MD  rosuvastatin (CRESTOR) 10 MG tablet Take 1  tablet twice weekly on Monday and Friday Patient taking differently: Take 10 mg by mouth 2 (two) times a week. Take 1 tablet twice weekly on Monday and Friday 05/07/16   Leonie Man, MD  traMADol (ULTRAM) 50 MG tablet Take 1 tablet (50 mg total) by mouth every 6 (six) hours as needed. Patient taking differently: Take 50 mg by mouth every 6 (six) hours as needed for moderate pain.  07/14/16   Nat Christen, MD  Turmeric (CURCUMIN 95) 500 MG CAPS Take 1 capsule by mouth daily.    [provider]  ULORIC 40 MG tablet Take 40 mg by mouth every morning.  05/09/15   [provider]  VASCEPA 1 g capsule Take 2 capsules by mouth twice daily 03/03/20   Leonie Man, MD    Physical Exam: BP 140/81   Pulse 91   Temp 98.1 F (36.7 C) (Oral)   Resp 17   Ht 6\' 6"  (1.981 m)   Wt 108.9 kg   SpO2 93%   BMI 27.73 kg/m   . General: 60 y.o. year-old male well developed well nourished in no acute distress.  Alert and oriented x3. Marland Kitchen HEENT: NCAT, EOMI . Neck: Supple, trachea medial . Cardiovascular: Regular rate and rhythm with no rubs or gallops.  No thyromegaly or JVD noted.  No lower extremity edema. 2/4 pulses in all 4 extremities. Marland Kitchen Respiratory: Clear to auscultation with no wheezes or rales. Good inspiratory effort. . Abdomen: Soft, mild tenderness to palpation of lower quadrants without guarding. Normal bowel sounds x4 quadrants. . Muskuloskeletal: No cyanosis, clubbing or edema noted bilaterally . Neuro: CN II-XII intact, strength, sensation, reflexes . Skin: No ulcerative lesions noted or rashes . Psychiatry: Judgement and insight appear normal. Mood is appropriate for condition and setting          Labs on Admission:  Basic Metabolic Panel: Recent Labs  Lab 03/01/2020 2240  NA 135  K 4.1  CL 101  CO2 24  GLUCOSE 232*  BUN 14  CREATININE 0.81  CALCIUM 9.7   Liver Function Tests: Recent Labs  Lab 03/10/2020 2240  AST 26  ALT 29  ALKPHOS 53  BILITOT 1.4*  PROT  8.2*  ALBUMIN 5.0   Recent Labs  Lab 02/29/2020 2240  LIPASE 21   No results for input(s): AMMONIA in the last 168 hours. CBC: Recent Labs  Lab 03/12/2020 2240  WBC 7.1  NEUTROABS  5.3  HGB 17.7*  HCT 50.5  MCV 90.2  PLT 174   Cardiac Enzymes: No results for input(s): CKTOTAL, CKMB, CKMBINDEX, TROPONINI in the last 168 hours.  BNP (last 3 results) No results for input(s): BNP in the last 8760 hours.  ProBNP (last 3 results) No results for input(s): PROBNP in the last 8760 hours.  CBG: No results for input(s): GLUCAP in the last 168 hours.  Radiological Exams on Admission: CT ABDOMEN PELVIS W CONTRAST  Result Date: 03/06/2020 CLINICAL DATA:  Abdominal pain EXAM: CT ABDOMEN AND PELVIS WITH CONTRAST TECHNIQUE: Multidetector CT imaging of the abdomen and pelvis was performed using the standard protocol following bolus administration of intravenous contrast. CONTRAST:  136mL OMNIPAQUE IOHEXOL 300 MG/ML  SOLN COMPARISON:  April 04, 2019 FINDINGS: Lower chest: The visualized heart size within normal limits. No pericardial fluid/thickening. No hiatal hernia. The visualized portions of the lungs are clear. Hepatobiliary: The liver is normal in density without focal abnormality.The main portal vein is patent. The patient is status post cholecystectomy. No biliary ductal dilation. Pancreas: Unremarkable. No pancreatic ductal dilatation or surrounding inflammatory changes. Spleen: Normal in size without focal abnormality. Adrenals/Urinary Tract: Both adrenal glands appear normal. The kidneys and collecting system appear normal without evidence of urinary tract calculus or hydronephrosis. Bladder is unremarkable. Stomach/Bowel: The stomach and proximal small bowel are unremarkable. There are mildly dilated air and fluid-filled loops of ileum measuring up to 3.2 cm. No clear transition point or focal wall narrowing is noted. No surrounding inflammatory changes are seen. There is been a prior  colectomy with an ileocolonic anastomosis within the deep pelvis. Vascular/Lymphatic: There are no enlarged mesenteric, retroperitoneal, or pelvic lymph nodes. Scattered aortic atherosclerotic calcifications are seen without aneurysmal dilatation. Reproductive: The prostate is unremarkable. Other: No evidence of abdominal wall mass or hernia. Musculoskeletal: No acute or significant osseous findings. IMPRESSION: Mildly dilated fluid and air-filled ileal loops without a clear transition point. This is likely due to a partial small bowel obstruction. Prior colectomy with anastomosis within the deep pelvis. Aortic Atherosclerosis (ICD10-I70.0). Electronically Signed   By: Prudencio Pair M.D.   On: 03/06/2020 00:46    EKG: I independently viewed the EKG done and my findings are as followed: EKG was not done in the ED  Assessment/Plan Present on Admission: . Partial small bowel obstruction ([place]) . Essential hypertension . Hyperlipidemia with target LDL less than 70  Principal Problem:   Partial small bowel obstruction ([facility]) Active Problems:   Hyperlipidemia with target LDL less than 70   Essential hypertension   CAD S/P DES PCI-circumflex   Abdominal pain   COVID-19 virus infection   Hyperglycemia due to diabetes mellitus ([facility])   Nausea and vomiting  Abdominal pain, nausea and vomiting secondary to partial small bowel obstruction NG tube attempted, but patient refused Continue NPO at this time with plan to advance diet as tolerated Continue IV hydration Continue IV morphine 2 mg q.4h p.r.n. for moderate/severe pain Continue Zofran p.r.n. for nausea/vomiting Consider surgical consult if abdominal pain continues to worsen  COVID-19 virus infection SARS coronavirus 2 was positive Patient was asymptomatic and there was no indication to start IV remdesivir or IV steroid at this time Continue monitoring daily inflammatory markers Physician PPE: Surgical mask with face shield, N-95, nonsterile  gloves, disposable gown, head and shoe covers Patient PPE:  Face mask   Hyperglycemia secondary to type 2 diabetes mellitus Continue ISS and hypoglycemic protocol Hold Jardiance at this time  Essential hypertension/Hyperlipidemia/CAD S/P  DES Resume home meds once patient starts to tolerate oral intake  DVT prophylaxis: Lovenox  Code Status: Full code  Family Communication: None at bedside  Disposition Plan:  Patient is from:                        home Anticipated DC to:                   home Anticipated DC date:               2-3 days Anticipated DC barriers:           Patient is not stable to be discharged home at this time due to nausea, vomiting, abdominal pain due to partial small bowel obstruction requiring inpatient management  Consults called: None  Admission status: Inpatient    Bernadette Hoit MD [facility]  03/06/2020, 3:01 AM

## 2020-03-06 NOTE — ED Notes (Signed)
Pt refused NG tube. states NG tubes do not work for him, they hurt too bad that he has a deviated septum. He claims he has been put to sleep completely in the past to have them placed.  MD made aware

## 2020-03-06 NOTE — ED Notes (Signed)
Date and time results received: 03/06/20 12:18 AM  Test: COVID Critical Value: Positive   Name of Provider Notified: Dr. Tomi Bamberger  Orders Received? Or Actions Taken?: MD notified

## 2020-03-06 NOTE — ED Notes (Signed)
Surgeon in to see pt 

## 2020-03-06 NOTE — ED Notes (Signed)
Pt.'s wife given update

## 2020-03-06 NOTE — ED Provider Notes (Signed)
Patient was left at change of shift to get results of his CT of abdomen/pelvis.  Patient has a history of prior bowel obstructions and states tonight he is having similar symptoms.  I discussed his test results with the patient.  He states he has NOT had the COVID-vaccine.  He denies cough, sore throat, or fever but states he did start having a headache after he got to the ED.  01:32 AM Dr Josephine Cables will admit.   Results for orders placed or performed during the hospital encounter of Mar 09, 2020  Resp Panel by RT-PCR (Flu A&B, Covid) Nasopharyngeal Swab   Specimen: Nasopharyngeal Swab; Nasopharyngeal(NP) swabs in vial transport medium  Result Value Ref Range   SARS Coronavirus 2 by RT PCR POSITIVE (A) NEGATIVE   Influenza A by PCR NEGATIVE NEGATIVE   Influenza B by PCR NEGATIVE NEGATIVE  Comprehensive metabolic panel  Result Value Ref Range   Sodium 135 135 - 145 mmol/L   Potassium 4.1 3.5 - 5.1 mmol/L   Chloride 101 98 - 111 mmol/L   CO2 24 22 - 32 mmol/L   Glucose, Bld 232 (H) 70 - 99 mg/dL   BUN 14 8 - 23 mg/dL   Creatinine, Ser 0.81 0.61 - 1.24 mg/dL   Calcium 9.7 8.9 - 10.3 mg/dL   Total Protein 8.2 (H) 6.5 - 8.1 g/dL   Albumin 5.0 3.5 - 5.0 g/dL   AST 26 15 - 41 U/L   ALT 29 0 - 44 U/L   Alkaline Phosphatase 53 38 - 126 U/L   Total Bilirubin 1.4 (H) 0.3 - 1.2 mg/dL   GFR, Estimated >60 >60 mL/min   Anion gap 10 5 - 15  Lipase, blood  Result Value Ref Range   Lipase 21 11 - 51 U/L  CBC with Differential  Result Value Ref Range   WBC 7.1 4.0 - 10.5 K/uL   RBC 5.60 4.22 - 5.81 MIL/uL   Hemoglobin 17.7 (H) 13.0 - 17.0 g/dL   HCT 50.5 39.0 - 52.0 %   MCV 90.2 80.0 - 100.0 fL   MCH 31.6 26.0 - 34.0 pg   MCHC 35.0 30.0 - 36.0 g/dL   RDW 12.0 11.5 - 15.5 %   Platelets 174 150 - 400 K/uL   nRBC 0.0 0.0 - 0.2 %   Neutrophils Relative % 75 %   Neutro Abs 5.3 1.7 - 7.7 K/uL   Lymphocytes Relative 13 %   Lymphs Abs 0.9 0.7 - 4.0 K/uL   Monocytes Relative 9 %   Monocytes  Absolute 0.7 0.1 - 1.0 K/uL   Eosinophils Relative 2 %   Eosinophils Absolute 0.1 0.0 - 0.5 K/uL   Basophils Relative 1 %   Basophils Absolute 0.1 0.0 - 0.1 K/uL   Immature Granulocytes 0 %   Abs Immature Granulocytes 0.01 0.00 - 0.07 K/uL    Laboratory interpretation all normal except hyperglycemia, concentrated hemoglobin consistent with dehydration, COVID-positive   CT ABDOMEN PELVIS W CONTRAST  Result Date: 03/06/2020 CLINICAL DATA:  Abdominal pain EXAM: CT ABDOMEN AND PELVIS WITH CONTRAST TECHNIQUE: Multidetector CT imaging of the abdomen and pelvis was performed using the standard protocol following bolus administration of intravenous contrast. CONTRAST:  136mL OMNIPAQUE IOHEXOL 300 MG/ML  SOLN COMPARISON:  April 04, 2019 FINDINGS: Lower chest: The visualized heart size within normal limits. No pericardial fluid/thickening. No hiatal hernia. The visualized portions of the lungs are clear. Hepatobiliary: The liver is normal in density without focal abnormality.The main portal vein is  patent. The patient is status post cholecystectomy. No biliary ductal dilation. Pancreas: Unremarkable. No pancreatic ductal dilatation or surrounding inflammatory changes. Spleen: Normal in size without focal abnormality. Adrenals/Urinary Tract: Both adrenal glands appear normal. The kidneys and collecting system appear normal without evidence of urinary tract calculus or hydronephrosis. Bladder is unremarkable. Stomach/Bowel: The stomach and proximal small bowel are unremarkable. There are mildly dilated air and fluid-filled loops of ileum measuring up to 3.2 cm. No clear transition point or focal wall narrowing is noted. No surrounding inflammatory changes are seen. There is been a prior colectomy with an ileocolonic anastomosis within the deep pelvis. Vascular/Lymphatic: There are no enlarged mesenteric, retroperitoneal, or pelvic lymph nodes. Scattered aortic atherosclerotic calcifications are seen without  aneurysmal dilatation. Reproductive: The prostate is unremarkable. Other: No evidence of abdominal wall mass or hernia. Musculoskeletal: No acute or significant osseous findings. IMPRESSION: Mildly dilated fluid and air-filled ileal loops without a clear transition point. This is likely due to a partial small bowel obstruction. Prior colectomy with anastomosis within the deep pelvis. Aortic Atherosclerosis (ICD10-I70.0). Electronically Signed   By: Prudencio Pair M.D.   On: 03/06/2020 00:46   Diagnoses that have been ruled out:  None  Diagnoses that are still under consideration:  None  Final diagnoses:  Partial small bowel obstruction ([place])  COVID-19 virus infection   Plan admission  Rolland Porter, MD, Barbette Or, MD 03/06/20 [id number]

## 2020-03-06 NOTE — Consult Note (Signed)
Referring Provider: No ref. provider found Primary Care Physician:  Reynold Bowen, MD Primary Gastroenterologist:  Dr. Hilarie Fredrickson  Reason for Consultation:  SBO  HPI: Patient is a pleasant 60 year old gentleman with a history of pan-ulcerative colitis - status post total proctocolectomy with ileal pouch anal anastomosis done approximately 20 years ago by Dr. Lenise Arena at [facility] who presents to the emergency department today with a 3-day history of abdominal cramps, bloating and nonbloody emesis.  States he ate a chicken sandwich at a [employer] 3 days ago and then started to have having abdominal cramps..  Symptoms did not improve; he presented to the ED for evaluation.  Evaluation here included a CT of the abdomen and pelvis which revealed some nonspecific dilation of the terminal ileum.  Appearance not consistent with high-grade small bowel obstruction.  Moreover, no evidence of any other acute inflammation or other  Process to explain his symptoms.  It is notable that he is COVID positive today.  He was never vaccinated.  White count normal.  LFTs normal except for slightly elevated total bilirubin of 1.4.  Lipase normal.    EDP and Dr. Carles Collet (hospitalist) recommended NG tube decompression.  Patient has refused.  This nice gentleman tells me that he has had multiple episodes of partial small bowel over the past several years - resolved with conservative management.  However,  over the past 2 years, he he reports being admitted on 2 occasions.  Baseline bowel function is that of 5-6 semiformed loose stools daily.  He takes Imodium as needed.    In addition, he has had numerous episodes of pouchitis.  In fact, he reports keeping Levaquin and Xifaxan at home should he have  A recurrence.  He notes that his pouchitis is typically manifested by extreme right lower quadrant/ pelvic pain. No such symptoms recently.  Back in February 2021, he was admitted with partial small bowel  obstruction; CT at that time demonstrated nonspecific dilation of jejunal loops.  Patient's other GI history includes GERD  - historically well controlled omeprazole 20 mg daily.  No dysphagia  This patient's GI care has been provided previously by Dr. Laural Golden but more recently he has been seeing Dr. Hilarie Fredrickson with [facility].    Past Medical History:  Diagnosis Date  . CAD S/P DES PCI-circumflex 07/22/2014   PCI mCx: PCI: 2.75 x 18 mm Xience Alpine DES to mid LCx at takeoff of OM1 (OM1 jailed 50% stenosis)  . Diabetes ([place])   . Erythrocytosis 11/28/2012  . Essential hypertension   . Fatigue 11/28/2012  . Gout 2014  . Hyperlipidemia with target LDL less than 70   . Hypertriglyceridemia 11/16/2014  . Kidney stones   . Non-STEMI (non-ST elevated myocardial infarction) ([place]) 07/22/2014   a) RCA: OK, LM: Okay ; LAD: Proximal to mid 40%; RI: 30%;LCx: Mid to distal 90%, distal 20%, OM1 50% --> DES PCI  . Ulcerative colitis    s/p ilioanal anastomosis (colectomy) ; has had multiple episodes of pouchitis.    Past Surgical History:  Procedure Laterality Date  . APPENDECTOMY    . CARDIAC CATHETERIZATION N/A 07/23/2014   Procedure: Left Heart Cath and Coronary Angiography;  Surgeon: Leonie Man, MD;  Location: [facility];  Service: Cardiovascular;  RCA: Mild disease; LM: Okay ; LAD: Proximal to mid 40%; RI: 30%;LCx: Mid to distal 90%, distal 20%, OM1 50%; Mid inferolateral HK, EF 55-65%   . CARDIAC CATHETERIZATION N/A 07/23/2014   Procedure: Coronary  Stent Intervention;  Surgeon: Leonie Man, MD;  Location: [facility];  Service: Cardiovascular; PCI: 2.75 x 18 mm Xience Alpine DES to mid LCx at takeoff of OM1 (OM1 jailed 50% stenosis)  . CHOLECYSTECTOMY    . COLECTOMY    . HERNIA REPAIR     umbilical  . illeoanalanastomosis  04/2001, 11/2001, 03/2202   has a J pouch  . TRANSTHORACIC ECHOCARDIOGRAM  07/22/2014   EF 55-60%, normal wall motion, normal diastolic function,  no valve  lesions     Prior to Admission medications   Medication Sig Start Date End Date Taking? Authorizing Provider  acetaminophen (TYLENOL) 325 MG tablet Take 650 mg by mouth every 6 (six) hours as needed for mild pain.   Yes [provider]  BRILINTA 60 MG TABS tablet Take 1 tablet by mouth twice daily Patient taking differently: Take 60 mg by mouth 2 (two) times daily. 03/03/20  Yes Leonie Man, MD  Cholecalciferol 100 MCG (4000 UT) CAPS Take 1 capsule by mouth every morning.    Yes [provider]  Coenzyme Q10 (COQ10) 100 MG CAPS Take 100 mg by mouth daily. GRADUALLY INCREASE TO 300 MG DAILY Patient taking differently: Take 100 mg by mouth every morning. 11/17/14  Yes Leonie Man, MD  diclofenac sodium (VOLTAREN) 1 % GEL Apply 2 g topically daily as needed (for pain).  09/14/15  Yes [provider]  diphenoxylate-atropine (LOMOTIL) 2.5-0.025 MG tablet Take 1 tablet by mouth 4 (four) times daily as needed for diarrhea or loose stools. Reported on 09/09/2015 Patient taking differently: Take 2 tablets by mouth in the morning and at bedtime. *May also take one table as needed for diarrhea/loose stools 11/22/15  Yes Rehman, Mechele Dawley, MD  empagliflozin (JARDIANCE) 10 MG TABS tablet Take 10 mg by mouth every morning.    Yes [provider]  ezetimibe (ZETIA) 10 MG tablet Take 1 tablet (10 mg total) by mouth daily. 08/27/16  Yes Leonie Man, MD  gabapentin (NEURONTIN) 600 MG tablet Take 1 tablet (600 mg total) by mouth at bedtime. 02/05/20  Yes Dickie La, MD  Lactobacillus (PROBIOTIC ACIDOPHILUS) CAPS Take 2 capsules by mouth daily.   Yes [provider]  metoprolol succinate (TOPROL-XL) 25 MG 24 hr tablet Take 12.5 mg by mouth every evening. 03/19/19  Yes [provider]  nitroGLYCERIN (NITROSTAT) 0.4 MG SL tablet Place 1 tablet (0.4 mg total) under the tongue every 5 (five) minutes x 3 doses as needed for chest pain. 10/11/17  Yes Duke, Tami Lin, PA  omeprazole (PRILOSEC OTC) 20 MG tablet Take 20 mg by mouth daily as needed (for acid reflux).    Yes [provider]  OVER THE COUNTER MEDICATION Take 2 tablets by mouth daily. Patient takes 2 by mouth daily - Vita - Sprout Capsules - Multi Vitamin   Yes [provider]  rosuvastatin (CRESTOR) 10 MG tablet Take 1 tablet twice weekly on Monday and Friday Patient taking differently: Take 10 mg by mouth 2 (two) times a week. Take 1 tablet twice weekly on Monday and Friday 05/07/16  Yes Leonie Man, MD  Turmeric 500 MG CAPS Take 1 capsule by mouth daily.   Yes [provider]  ULORIC 40 MG tablet Take 40 mg by mouth every morning.  05/09/15  Yes [provider]  VASCEPA 1 g capsule Take 2 capsules by mouth twice daily Patient taking differently: Take 2 g by mouth 2 (  two) times daily. 03/03/20  Yes Leonie Man, MD    Current Facility-Administered Medications  Medication Dose Route Frequency Provider Last Rate Last Admin  . enoxaparin (LOVENOX) injection 40 mg  40 mg Subcutaneous Q24H Tat, David, MD      . HYDROmorphone (DILAUDID) injection 1 mg  1 mg Intravenous Q3H PRN Orson Eva, MD   1 mg at 03/06/20 0745  . insulin aspart (novoLOG) injection 0-9 Units  0-9 Units Subcutaneous TID WC Tat, Shanon Brow, MD      . lactated ringers infusion   Intravenous Continuous Orson Eva, MD 100 mL/hr at 03/06/20 0749 New Bag at 03/06/20 0749  . ondansetron (ZOFRAN) injection 4 mg  4 mg Intravenous Q6H Tat, David, MD      . pantoprazole (PROTONIX) injection 40 mg  40 mg Intravenous Q12H Tat, David, MD      . promethazine (PHENERGAN) injection 12.5 mg  12.5 mg Intravenous Q6H PRN Orson Eva, MD       Current Outpatient Medications  Medication Sig Dispense Refill  . acetaminophen (TYLENOL) 325 MG tablet Take 650 mg by mouth every 6 (six) hours as needed for mild pain.    Marland Kitchen BRILINTA 60 MG TABS tablet Take 1 tablet by mouth twice daily (Patient taking differently:  Take 60 mg by mouth 2 (two) times daily.) 60 tablet 0  . Cholecalciferol 100 MCG (4000 UT) CAPS Take 1 capsule by mouth every morning.     . Coenzyme Q10 (COQ10) 100 MG CAPS Take 100 mg by mouth daily. GRADUALLY INCREASE TO 300 MG DAILY (Patient taking differently: Take 100 mg by mouth every morning.) 90 each 11  . diclofenac sodium (VOLTAREN) 1 % GEL Apply 2 g topically daily as needed (for pain).     Marland Kitchen diphenoxylate-atropine (LOMOTIL) 2.5-0.025 MG tablet Take 1 tablet by mouth 4 (four) times daily as needed for diarrhea or loose stools. Reported on 09/09/2015 (Patient taking differently: Take 2 tablets by mouth in the morning and at bedtime. *May also take one table as needed for diarrhea/loose stools) 120 tablet 2  . empagliflozin (JARDIANCE) 10 MG TABS tablet Take 10 mg by mouth every morning.     . ezetimibe (ZETIA) 10 MG tablet Take 1 tablet (10 mg total) by mouth daily. 90 tablet 3  . gabapentin (NEURONTIN) 600 MG tablet Take 1 tablet (600 mg total) by mouth at bedtime. 30 tablet 1  . Lactobacillus (PROBIOTIC ACIDOPHILUS) CAPS Take 2 capsules by mouth daily.    . metoprolol succinate (TOPROL-XL) 25 MG 24 hr tablet Take 12.5 mg by mouth every evening.    . nitroGLYCERIN (NITROSTAT) 0.4 MG SL tablet Place 1 tablet (0.4 mg total) under the tongue every 5 (five) minutes x 3 doses as needed for chest pain. 25 tablet 3  . omeprazole (PRILOSEC OTC) 20 MG tablet Take 20 mg by mouth daily as needed (for acid reflux).     Marland Kitchen OVER THE COUNTER MEDICATION Take 2 tablets by mouth daily. Patient takes 2 by mouth daily - Vita - Sprout Capsules - Multi Vitamin    . rosuvastatin (CRESTOR) 10 MG tablet Take 1 tablet twice weekly on Monday and Friday (Patient taking differently: Take 10 mg by mouth 2 (two) times a week. Take 1 tablet twice weekly on Monday and Friday) 60 tablet 0  . Turmeric 500 MG CAPS Take 1 capsule by mouth daily.    Marland Kitchen ULORIC 40 MG tablet Take 40 mg by mouth every morning.   1  .  VASCEPA 1 g  capsule Take 2 capsules by mouth twice daily (Patient taking differently: Take 2 g by mouth 2 (two) times daily.) 120 capsule 0    Allergies as of 03/20/2020 - Review Complete 10/23/2019  Allergen Reaction Noted  . Metronidazole Itching 11/28/2012  . Ciprofloxacin Itching and Nausea And Vomiting 12/22/2012  . Dexilant [dexlansoprazole] Diarrhea 10/11/2017  . Morphine and related Itching 10/31/2010  . Doxycycline Itching, Other (See Comments), and Rash 10/31/2010    Family History  Problem Relation Age of Onset  . Diabetes Mother   . Coronary artery disease Mother   . Cancer Father 17       lung, smoker  . Diabetes Brother   . Colon cancer Neg Hx   . Esophageal cancer Neg Hx   . Rectal cancer Neg Hx     Social History   Socioeconomic History  . Marital status: Married    Spouse name: Maudie Mercury  . Number of children: 1  . Years of education: Not on file  . Highest education level: Not on file  Occupational History  . Occupation: maintencnce  Tobacco Use  . Smoking status: Former Smoker    Packs/day: 1.00    Years: 25.00    Pack years: 25.00    Types: Cigarettes    Quit date: 12/22/2001    Years since quitting: 18.2  . Smokeless tobacco: Never Used  Vaping Use  . Vaping Use: Never used  Substance and Sexual Activity  . Alcohol use: No    Alcohol/week: 0.0 standard drinks  . Drug use: No  . Sexual activity: Not on file  Other Topics Concern  . Not on file  Social History Narrative   One Son and 2 adopted grandkids   Social Determinants of Health   Financial Resource Strain: Not on file  Food Insecurity: Not on file  Transportation Needs: Not on file  Physical Activity: Not on file  Stress: Not on file  Social Connections: Not on file  Intimate Partner Violence: Not on file    Review of Systems:  As in history of present illness Physical Exam: Vital signs in last 24 hours: Temp:  [98.1 F (36.7 C)-98.3 F (36.8 C)] 98.3 F (36.8 C) (01/16 0736) Pulse  Rate:  [78-112] 83 (01/16 0830) Resp:  [13-22] 17 (01/16 0830) BP: (95-140)/(62-87) 95/74 (01/16 0830) SpO2:  [93 %-96 %] 95 % (01/16 0830) Weight:  [108.9 kg] 108.9 kg (01/15 2213)    Patient seen in ED room 6  General:   Alert,   pleasant and cooperative in NAD Lungs:  Clear throughout to auscultation.   No wheezes, crackles, or rhonchi. No acute distress. Heart:  Regular rate and rhythm; no murmurs, clicks, rubs,  or gallops. Abdomen: Abdomen full but not markedly distended.  Multiple surgical scars.  Positive bowel sounds.  Soft with very minimal diffuse abdominal tenderness to deep palpation.  No mass or obvious organomegaly.  Intake/Output from previous day: 01/15 0701 - 01/16 0700 In: 1265.4 [IV Piggyback:1265.4] Out: -  Intake/Output this shift: Total I/O In: 100 [I.V.:100] Out: -   Lab Results: Recent Labs    02/29/2020 2240  WBC 7.1  HGB 17.7*  HCT 50.5  PLT 174   BMET Recent Labs    03/19/2020 2240  NA 135  K 4.1  CL 101  CO2 24  GLUCOSE 232*  BUN 14  CREATININE 0.81  CALCIUM 9.7   LFT Recent Labs    03/20/2020 2240  PROT 8.2*  ALBUMIN 5.0  AST 26  ALT 29  ALKPHOS 53  BILITOT 1.4*   PT/INR No results for input(s): LABPROT, INR in the last 72 hours. Hepatitis Panel No results for input(s): HEPBSAG, HCVAB, HEPAIGM, HEPBIGM in the last 72 hours. C-Diff No results for input(s): CDIFFTOX in the last 72 hours.  Studies/Results: CT ABDOMEN PELVIS W CONTRAST  Result Date: 03/06/2020 CLINICAL DATA:  Abdominal pain EXAM: CT ABDOMEN AND PELVIS WITH CONTRAST TECHNIQUE: Multidetector CT imaging of the abdomen and pelvis was performed using the standard protocol following bolus administration of intravenous contrast. CONTRAST:  162mL OMNIPAQUE IOHEXOL 300 MG/ML  SOLN COMPARISON:  April 04, 2019 FINDINGS: Lower chest: The visualized heart size within normal limits. No pericardial fluid/thickening. No hiatal hernia. The visualized portions of the lungs are  clear. Hepatobiliary: The liver is normal in density without focal abnormality.The main portal vein is patent. The patient is status post cholecystectomy. No biliary ductal dilation. Pancreas: Unremarkable. No pancreatic ductal dilatation or surrounding inflammatory changes. Spleen: Normal in size without focal abnormality. Adrenals/Urinary Tract: Both adrenal glands appear normal. The kidneys and collecting system appear normal without evidence of urinary tract calculus or hydronephrosis. Bladder is unremarkable. Stomach/Bowel: The stomach and proximal small bowel are unremarkable. There are mildly dilated air and fluid-filled loops of ileum measuring up to 3.2 cm. No clear transition point or focal wall narrowing is noted. No surrounding inflammatory changes are seen. There is been a prior colectomy with an ileocolonic anastomosis within the deep pelvis. Vascular/Lymphatic: There are no enlarged mesenteric, retroperitoneal, or pelvic lymph nodes. Scattered aortic atherosclerotic calcifications are seen without aneurysmal dilatation. Reproductive: The prostate is unremarkable. Other: No evidence of abdominal wall mass or hernia. Musculoskeletal: No acute or significant osseous findings. IMPRESSION: Mildly dilated fluid and air-filled ileal loops without a clear transition point. This is likely due to a partial small bowel obstruction. Prior colectomy with anastomosis within the deep pelvis. Aortic Atherosclerosis (ICD10-I70.0). Electronically Signed   By: Prudencio Pair M.D.   On: 03/06/2020 00:46    Impression: 60 year old gentleman with a history of pan-ulcerative colitis - status post distant total proctocolectomy with ileal-pouch anal anastomosis admitted with 3-day history of abdominal cramps, nausea and vomiting.  CT reveals nonspecific dilated loops of terminal ileum without transition point or evidence of a high-grade partial small bowel obstruction.  The patient also has acute SARS COVID-19.  He does  not appear to have a high-grade bowel obstruction.  He does not look at toxic at all.  There is a history of multiple prior similar similar clinical presentations.  It is interesting that the prior CT in February of last year demonstrated dilation primarily involving the jejunum.  I doubt he has pouchitis.  It is certainly possible that acute SARS COVID-19 infection may be contributing to his acute GI illness or could simply be a coincidence.  Mildly elevated total bilirubin.  This has been seen on multiple occasions without fractionation.  Remainder of LFTs normal.  Likely Gilbert's.  Recommendations:  N.p.o. except ice chips.  I feel we can forego NG tube at this time  IV hydration.  GI illness may well resolve on its own in the near future.  No need for urgent surgical consultation at this time  Consider fractionation of bilirubin with next comprehensive metabolic panel.  Further recommendations to follow.

## 2020-03-06 NOTE — Consult Note (Signed)
Patient ID: Calvin Cruz, male   DOB: 1960/04/04, 60 y.o.   MRN: [id number]  HPI Calvin Cruz is a 60 y.o. male seen in consultation at the request of Dr. Carles Collet.  He presented yesterday with a history of abdominal pain that started for the last 48 hours.  Patient reports that the pain is colicky intermittent and diffuse.  It is moderate in intensity.  No other specific alleviating or aggravating factors.  Patient has a significant history of ulcerative colitis and is status post proctocolectomy with ileoanal anastomosis for that.  That was performed at [facility] more than 17 years ago.  He reports that he has chronic GI issues and is followed by a local gastroenterologist.  He also has had similar episodes to this and they are partial small bowel obstruction versus pouchitis.  He has reportedly responded well in the past to antibiotic therapy.  In addition to all this he tested COVID-positive.  I do think that this is an incidental findings.  He does have a significant history of coronary artery disease and does have a drug-eluting stent and is on Brilinta.  He does have diabetes and polycythemia. He underwent a CT scan of the abdomen and pelvis that I have personally reviewed showing evidence of mildly dilated ileum, the anastomosis seems to be patent and without much issues or abscess within the pelvis.  There is no evidence of pneumatosis there is no evidence of free air. He did have a BMP that was unremarkable.  White count was 7.7 hemoglobin of 17 and platelets of 174,000. He already had a history of cholecystectomy and umbilical hernia repair in addition to the subtotal colectomy  HPI  Past Medical History:  Diagnosis Date  . CAD S/P DES PCI-circumflex 07/22/2014   PCI mCx: PCI: 2.75 x 18 mm Xience Alpine DES to mid LCx at takeoff of OM1 (OM1 jailed 50% stenosis)  . Diabetes ([place])   . Erythrocytosis 11/28/2012  . Essential hypertension   . Fatigue 11/28/2012  . Gout 2014  .  Hyperlipidemia with target LDL less than 70   . Hypertriglyceridemia 11/16/2014  . Kidney stones   . Non-STEMI (non-ST elevated myocardial infarction) ([place]) 07/22/2014   a) RCA: OK, LM: Okay ; LAD: Proximal to mid 40%; RI: 30%;LCx: Mid to distal 90%, distal 20%, OM1 50% --> DES PCI  . Ulcerative colitis    s/p ilioanal anastomosis (colectomy) ; has had multiple episodes of pouchitis.    Past Surgical History:  Procedure Laterality Date  . APPENDECTOMY    . CARDIAC CATHETERIZATION N/A 07/23/2014   Procedure: Left Heart Cath and Coronary Angiography;  Surgeon: Leonie Man, MD;  Location: [place] [facility];  Service: Cardiovascular;  RCA: Mild disease; LM: Okay ; LAD: Proximal to mid 40%; RI: 30%;LCx: Mid to distal 90%, distal 20%, OM1 50%; Mid inferolateral HK, EF 55-65%   . CARDIAC CATHETERIZATION N/A 07/23/2014   Procedure: Coronary Stent Intervention;  Surgeon: Leonie Man, MD;  Location: [place] [facility];  Service: Cardiovascular; PCI: 2.75 x 18 mm Xience Alpine DES to mid LCx at takeoff of OM1 (OM1 jailed 50% stenosis)  . CHOLECYSTECTOMY    . COLECTOMY    . HERNIA REPAIR     umbilical  . illeoanalanastomosis  04/2001, 11/2001, 03/2202   has a J pouch  . TRANSTHORACIC ECHOCARDIOGRAM  07/22/2014   EF 55-60%, normal wall motion, normal diastolic function,  no valve lesions     Family History  Problem Relation Age of Onset  . Diabetes Mother   . Coronary artery disease Mother   . Cancer Father 75       lung, smoker  . Diabetes Brother   . Colon cancer Neg Hx   . Esophageal cancer Neg Hx   . Rectal cancer Neg Hx     Social History Social History   Tobacco Use  . Smoking status: Former Smoker    Packs/day: 1.00    Years: 25.00    Pack years: 25.00    Types: Cigarettes    Quit date: 12/22/2001    Years since quitting: 18.2  . Smokeless tobacco: Never Used  Vaping Use  . Vaping Use: Never used  Substance Use Topics  . Alcohol use: No    Alcohol/week: 0.0 standard  drinks  . Drug use: No    Allergies  Allergen Reactions  . Metronidazole Itching    Unknown  . Ciprofloxacin Itching and Nausea And Vomiting  . Dexilant [Dexlansoprazole] Diarrhea  . Morphine And Related Itching  . Doxycycline Itching, Other (See Comments) and Rash    Redness of skin, burning sensation Unknown     Current Facility-Administered Medications  Medication Dose Route Frequency Provider Last Rate Last Admin  . enoxaparin (LOVENOX) injection 40 mg  40 mg Subcutaneous Q24H Tat, David, MD      . HYDROmorphone (DILAUDID) injection 1 mg  1 mg Intravenous Q3H PRN Orson Eva, MD   1 mg at 03/06/20 1104  . insulin aspart (novoLOG) injection 0-9 Units  0-9 Units Subcutaneous TID WC Tat, Shanon Brow, MD      . lactated ringers infusion   Intravenous Continuous Orson Eva, MD 100 mL/hr at 03/06/20 0749 New Bag at 03/06/20 0749  . ondansetron (ZOFRAN) injection 4 mg  4 mg Intravenous Q6H Tat, David, MD      . pantoprazole (PROTONIX) injection 40 mg  40 mg Intravenous Therisa Doyne, MD   40 mg at 03/06/20 1024  . promethazine (PHENERGAN) injection 12.5 mg  12.5 mg Intravenous Q6H PRN Orson Eva, MD       Current Outpatient Medications  Medication Sig Dispense Refill  . acetaminophen (TYLENOL) 325 MG tablet Take 650 mg by mouth every 6 (six) hours as needed for mild pain.    Marland Kitchen BRILINTA 60 MG TABS tablet Take 1 tablet by mouth twice daily (Patient taking differently: Take 60 mg by mouth 2 (two) times daily.) 60 tablet 0  . Cholecalciferol 100 MCG (4000 UT) CAPS Take 1 capsule by mouth every morning.     . Coenzyme Q10 (COQ10) 100 MG CAPS Take 100 mg by mouth daily. GRADUALLY INCREASE TO 300 MG DAILY (Patient taking differently: Take 100 mg by mouth every morning.) 90 each 11  . diclofenac sodium (VOLTAREN) 1 % GEL Apply 2 g topically daily as needed (for pain).     Marland Kitchen diphenoxylate-atropine (LOMOTIL) 2.5-0.025 MG tablet Take 1 tablet by mouth 4 (four) times daily as needed for diarrhea or  loose stools. Reported on 09/09/2015 (Patient taking differently: Take 2 tablets by mouth in the morning and at bedtime. *May also take one table as needed for diarrhea/loose stools) 120 tablet 2  . empagliflozin (JARDIANCE) 10 MG TABS tablet Take 10 mg by mouth every morning.     . ezetimibe (ZETIA) 10 MG tablet Take 1 tablet (10 mg total) by mouth daily. 90 tablet 3  . gabapentin (NEURONTIN) 600 MG tablet Take 1 tablet (600 mg total) by mouth at bedtime.  30 tablet 1  . Lactobacillus (PROBIOTIC ACIDOPHILUS) CAPS Take 2 capsules by mouth daily.    . metoprolol succinate (TOPROL-XL) 25 MG 24 hr tablet Take 12.5 mg by mouth every evening.    . nitroGLYCERIN (NITROSTAT) 0.4 MG SL tablet Place 1 tablet (0.4 mg total) under the tongue every 5 (five) minutes x 3 doses as needed for chest pain. 25 tablet 3  . omeprazole (PRILOSEC OTC) 20 MG tablet Take 20 mg by mouth daily as needed (for acid reflux).     Marland Kitchen OVER THE COUNTER MEDICATION Take 2 tablets by mouth daily. Patient takes 2 by mouth daily - Vita - Sprout Capsules - Multi Vitamin    . rosuvastatin (CRESTOR) 10 MG tablet Take 1 tablet twice weekly on Monday and Friday (Patient taking differently: Take 10 mg by mouth 2 (two) times a week. Take 1 tablet twice weekly on Monday and Friday) 60 tablet 0  . Turmeric 500 MG CAPS Take 1 capsule by mouth daily.    Marland Kitchen ULORIC 40 MG tablet Take 40 mg by mouth every morning.   1  . VASCEPA 1 g capsule Take 2 capsules by mouth twice daily (Patient taking differently: Take 2 g by mouth 2 (two) times daily.) 120 capsule 0     Review of Systems Full ROS  was asked and was negative except for the information on the HPI  Physical Exam Blood pressure 127/80, pulse 82, temperature 98.3 F (36.8 C), temperature source Oral, resp. rate 16, height 6\' 6"  (1.981 m), weight 108.9 kg, SpO2 93 %. CONSTITUTIONAL: NAD EYES: Pupils are equal, round,Sclera are non-icteric. EARS, NOSE, MOUTH AND THROAT wearing a mask Hearing is  intact to voice. LYMPH NODES:  Lymph nodes in the neck are normal. RESPIRATORY:  Lungs are clear. There is normal respiratory effort, with equal breath sounds bilaterally, and without pathologic use of accessory muscles. CARDIOVASCULAR: Heart is regular without murmurs, gallops, or rubs. GI: The abdomen is  soft, there are decreased bowel sounds.  It is mildly tender to palpation diffusely without any peritonitis.  Prior laparotomy scar.  GU: Rectal deferred.   MUSCULOSKELETAL: Normal muscle strength and tone. No cyanosis or edema.   SKIN: Turgor is good and there are no pathologic skin lesions or ulcers. NEUROLOGIC: Motor and sensation is grossly normal. Cranial nerves are grossly intact. PSYCH:  Oriented to person, place and time. Affect is normal.  Data Reviewed  I have personally reviewed the patient's imaging, laboratory findings and medical records.    Assessment/Plan Mr. Clair Gulling is a 60 year old male with past medical history significant for ulcerative colitis status post subtotal colectomy with ileoanal pouch anastomosis.  He now presents with abdominal pain and mild dilated small bowel.  Patient has a history of recurrent pouchitis and chronic abdominal pain.  At this time there is no evidence of bowel obstruction that is mechanical.  No need for surgical intervention.  The patient has refused an NG tube and currently he is not vomiting.  We will hold off on that and keep the patient n.p.o.  Question then will become about his chronic ongoing GI issues.  Differentials will also include pouchitis versus Crohn's disease.  Although this seems to be ulcerative colitis given the chronicity of his GI symptoms Crohn's disease cannot be ruled out.  I do recommend evaluation  by gastroenterologist who can determine appropriate therapy for pouchitis and further work-up regarding potential other GI functional issues. We will follow. D/w Dr. Carles Collet in detail.  Caroleen Hamman, MD FACS General  Surgeon 03/06/2020, 12:50 PM

## 2020-03-07 ENCOUNTER — Telehealth: Payer: Self-pay

## 2020-03-07 ENCOUNTER — Inpatient Hospital Stay (HOSPITAL_COMMUNITY): Payer: Commercial Managed Care - PPO

## 2020-03-07 DIAGNOSIS — R17 Unspecified jaundice: Secondary | ICD-10-CM

## 2020-03-07 DIAGNOSIS — K56609 Unspecified intestinal obstruction, unspecified as to partial versus complete obstruction: Secondary | ICD-10-CM | POA: Diagnosis not present

## 2020-03-07 DIAGNOSIS — K56600 Partial intestinal obstruction, unspecified as to cause: Secondary | ICD-10-CM | POA: Diagnosis not present

## 2020-03-07 DIAGNOSIS — E1165 Type 2 diabetes mellitus with hyperglycemia: Secondary | ICD-10-CM | POA: Diagnosis not present

## 2020-03-07 DIAGNOSIS — U071 COVID-19: Secondary | ICD-10-CM | POA: Diagnosis not present

## 2020-03-07 DIAGNOSIS — I10 Essential (primary) hypertension: Secondary | ICD-10-CM | POA: Diagnosis not present

## 2020-03-07 DIAGNOSIS — Z8719 Personal history of other diseases of the digestive system: Secondary | ICD-10-CM

## 2020-03-07 LAB — CBC WITH DIFFERENTIAL/PLATELET
Abs Immature Granulocytes: 0.01 10*3/uL (ref 0.00–0.07)
Basophils Absolute: 0 10*3/uL (ref 0.0–0.1)
Basophils Relative: 0 %
Eosinophils Absolute: 0 10*3/uL (ref 0.0–0.5)
Eosinophils Relative: 0 %
HCT: 50.3 % (ref 39.0–52.0)
Hemoglobin: 17.4 g/dL — ABNORMAL HIGH (ref 13.0–17.0)
Immature Granulocytes: 0 %
Lymphocytes Relative: 26 %
Lymphs Abs: 1.1 10*3/uL (ref 0.7–4.0)
MCH: 31.4 pg (ref 26.0–34.0)
MCHC: 34.6 g/dL (ref 30.0–36.0)
MCV: 90.8 fL (ref 80.0–100.0)
Monocytes Absolute: 0.6 10*3/uL (ref 0.1–1.0)
Monocytes Relative: 15 %
Neutro Abs: 2.4 10*3/uL (ref 1.7–7.7)
Neutrophils Relative %: 59 %
Platelets: 176 10*3/uL (ref 150–400)
RBC: 5.54 MIL/uL (ref 4.22–5.81)
RDW: 11.9 % (ref 11.5–15.5)
WBC: 4.1 10*3/uL (ref 4.0–10.5)
nRBC: 0 % (ref 0.0–0.2)

## 2020-03-07 LAB — CBG MONITORING, ED
Glucose-Capillary: 205 mg/dL — ABNORMAL HIGH (ref 70–99)
Glucose-Capillary: 181 mg/dL — ABNORMAL HIGH (ref 70–99)

## 2020-03-07 LAB — COMPREHENSIVE METABOLIC PANEL
ALT: 22 U/L (ref 0–44)
AST: 18 U/L (ref 15–41)
Albumin: 4.2 g/dL (ref 3.5–5.0)
Alkaline Phosphatase: 46 U/L (ref 38–126)
Anion gap: 9 (ref 5–15)
BUN: 18 mg/dL (ref 8–23)
CO2: 24 mmol/L (ref 22–32)
Calcium: 9 mg/dL (ref 8.9–10.3)
Chloride: 102 mmol/L (ref 98–111)
Creatinine, Ser: 0.68 mg/dL (ref 0.61–1.24)
GFR, Estimated: >60 mL/min (ref >60)
Glucose, Bld: 215 mg/dL — ABNORMAL HIGH (ref 70–99)
Potassium: 4 mmol/L (ref 3.5–5.1)
Sodium: 135 mmol/L (ref 135–145)
Total Bilirubin: 1.5 mg/dL — ABNORMAL HIGH (ref 0.3–1.2)
Total Protein: 7.2 g/dL (ref 6.5–8.1)

## 2020-03-07 LAB — MAGNESIUM: Magnesium: 1.8 mg/dL (ref 1.7–2.4)

## 2020-03-07 LAB — GLUCOSE, CAPILLARY
Glucose-Capillary: 179 mg/dL — ABNORMAL HIGH (ref 70–99)
Glucose-Capillary: 184 mg/dL — ABNORMAL HIGH (ref 70–99)

## 2020-03-07 LAB — D-DIMER, QUANTITATIVE: D-Dimer, Quant: 0.4 ug/mL-FEU (ref 0.00–0.50)

## 2020-03-07 LAB — HEMOGLOBIN A1C
Hgb A1c MFr Bld: 6.7 % — ABNORMAL HIGH (ref 4.8–5.6)
Mean Plasma Glucose: 145.59 mg/dL

## 2020-03-07 LAB — PHOSPHORUS: Phosphorus: 2.7 mg/dL (ref 2.5–4.6)

## 2020-03-07 LAB — FERRITIN: Ferritin: 271 ng/mL (ref 24–336)

## 2020-03-07 LAB — C-REACTIVE PROTEIN: CRP: 3.3 mg/dL — ABNORMAL HIGH (ref <1.0)

## 2020-03-07 LAB — HIV ANTIBODY (ROUTINE TESTING W REFLEX): HIV Screen 4th Generation wRfx: NONREACTIVE

## 2020-03-07 MED ORDER — FENTANYL CITRATE (PF) 100 MCG/2ML IJ SOLN: 100.0000 ug | INTRAMUSCULAR | Status: DC | PRN

## 2020-03-07 MED ORDER — INSULIN ASPART 100 UNIT/ML ~~LOC~~ SOLN
0.0000 [IU] | SUBCUTANEOUS | Status: DC
  Administered 2020-03-07 – 2020-03-09 (×13): 3 [IU] via SUBCUTANEOUS
  Administered 2020-03-09 – 2020-03-10 (×6): 5 [IU] via SUBCUTANEOUS
  Filled 2020-03-07: qty 1

## 2020-03-07 MED ORDER — HYDROMORPHONE HCL 2 MG/ML IJ SOLN
1.5000 mg | INTRAMUSCULAR | Status: DC | PRN
  Administered 2020-03-07: 1.5 mg via INTRAVENOUS
  Filled 2020-03-07: qty 1

## 2020-03-07 MED ORDER — FENTANYL CITRATE (PF) 100 MCG/2ML IJ SOLN
100.0000 ug | INTRAMUSCULAR | Status: DC | PRN
  Administered 2020-03-07 – 2020-03-09 (×16): 100 ug via INTRAVENOUS
  Filled 2020-03-07 (×16): qty 2

## 2020-03-07 MED ORDER — LIDOCAINE HCL URETHRAL/MUCOSAL 2 % EX GEL
CUTANEOUS | Status: AC
  Filled 2020-03-07: qty 10

## 2020-03-07 MED ORDER — HYDROMORPHONE HCL 1 MG/ML IJ SOLN
1.5000 mg | INTRAMUSCULAR | Status: DC | PRN
  Administered 2020-03-07: 1.5 mg via INTRAVENOUS
  Filled 2020-03-07: qty 1.5

## 2020-03-07 NOTE — ED Notes (Signed)
Pt complaining of abdominal distention, now asking to try NG tube.

## 2020-03-07 NOTE — ED Notes (Signed)
Pt agreed to allow this RN and Maci RN to attempt NG Tube placement for decompression of bowels. Both RNs made attempts which failed. NG Tube could not advance and returned through oral passageway on 2 attempts. Pt had multipe episodes of emesis during encounter. Pt can not tolerate a 3rd attempt at this time.

## 2020-03-07 NOTE — Progress Notes (Signed)
Calvin Cruz, M.D. Gastroenterology & Hepatology   Interval History:  Patient is laying in bed, states he has been in significant discomfort due to episodes of vomiting. Per the nursing staff he has vomited close to 4 times today.  States he has not moved his bowels, denies any melena, hematochezia, fever or chills.  Has not passed gas yet.  Denies any respiratory symptoms at the moment. Based on discussion with hospitalist, the patient had 3 attempts to place an NG tube yesterday but this could not be achieved as the patient had previous nasal fractures..  Inpatient Medications:  Current Facility-Administered Medications:  .  enoxaparin (LOVENOX) injection 40 mg, 40 mg, Subcutaneous, Q24H, Tat, David, MD, 40 mg at 03/06/20 2208 .  HYDROmorphone (DILAUDID) injection 1.5 mg, 1.5 mg, Intravenous, Q3H PRN, Tat, David, MD, 1.5 mg at 03/07/20 0937 .  insulin aspart (novoLOG) injection 0-15 Units, 0-15 Units, Subcutaneous, Q4H, Tat, David, MD .  lactated ringers infusion, , Intravenous, Continuous, Tat, David, MD, Last Rate: 100 mL/hr at 03/07/20 0553, New Bag at 03/07/20 0553 .  ondansetron (ZOFRAN) injection 4 mg, 4 mg, Intravenous, Q6H, Tat, Shanon Brow, MD, 4 mg at 03/07/20 0550 .  pantoprazole (PROTONIX) injection 40 mg, 40 mg, Intravenous, Q12H, Tat, Shanon Brow, MD, 40 mg at 03/07/20 6010 .  promethazine (PHENERGAN) injection 12.5 mg, 12.5 mg, Intravenous, Q6H PRN, Tat, David, MD, 12.5 mg at 03/07/20 9323  Current Outpatient Medications:  .  acetaminophen (TYLENOL) 325 MG tablet, Take 650 mg by mouth every 6 (six) hours as needed for mild pain., Disp: , Rfl:  .  BRILINTA 60 MG TABS tablet, Take 1 tablet by mouth twice daily (Patient taking differently: Take 60 mg by mouth 2 (two) times daily.), Disp: 60 tablet, Rfl: 0 .  Cholecalciferol 100 MCG (4000 UT) CAPS, Take 1 capsule by mouth every morning. , Disp: , Rfl:  .  Coenzyme Q10 (COQ10) 100 MG CAPS, Take 100 mg by mouth daily. GRADUALLY INCREASE TO  300 MG DAILY (Patient taking differently: Take 100 mg by mouth every morning.), Disp: 90 each, Rfl: 11 .  diclofenac sodium (VOLTAREN) 1 % GEL, Apply 2 g topically daily as needed (for pain). , Disp: , Rfl:  .  diphenoxylate-atropine (LOMOTIL) 2.5-0.025 MG tablet, Take 1 tablet by mouth 4 (four) times daily as needed for diarrhea or loose stools. Reported on 09/09/2015 (Patient taking differently: Take 2 tablets by mouth in the morning and at bedtime. *May also take one table as needed for diarrhea/loose stools), Disp: 120 tablet, Rfl: 2 .  empagliflozin (JARDIANCE) 10 MG TABS tablet, Take 10 mg by mouth every morning. , Disp: , Rfl:  .  ezetimibe (ZETIA) 10 MG tablet, Take 1 tablet (10 mg total) by mouth daily., Disp: 90 tablet, Rfl: 3 .  gabapentin (NEURONTIN) 600 MG tablet, Take 1 tablet (600 mg total) by mouth at bedtime., Disp: 30 tablet, Rfl: 1 .  Lactobacillus (PROBIOTIC ACIDOPHILUS) CAPS, Take 2 capsules by mouth daily., Disp: , Rfl:  .  metoprolol succinate (TOPROL-XL) 25 MG 24 hr tablet, Take 12.5 mg by mouth every evening., Disp: , Rfl:  .  nitroGLYCERIN (NITROSTAT) 0.4 MG SL tablet, Place 1 tablet (0.4 mg total) under the tongue every 5 (five) minutes x 3 doses as needed for chest pain., Disp: 25 tablet, Rfl: 3 .  omeprazole (PRILOSEC OTC) 20 MG tablet, Take 20 mg by mouth daily as needed (for acid reflux). , Disp: , Rfl:  .  OVER THE COUNTER MEDICATION, Take  2 tablets by mouth daily. Patient takes 2 by mouth daily - Vita - Sprout Capsules - Multi Vitamin, Disp: , Rfl:  .  rosuvastatin (CRESTOR) 10 MG tablet, Take 1 tablet twice weekly on Monday and Friday (Patient taking differently: Take 10 mg by mouth 2 (two) times a week. Take 1 tablet twice weekly on Monday and Friday), Disp: 60 tablet, Rfl: 0 .  Turmeric 500 MG CAPS, Take 1 capsule by mouth daily., Disp: , Rfl:  .  ULORIC 40 MG tablet, Take 40 mg by mouth every morning. , Disp: , Rfl: 1 .  VASCEPA 1 g capsule, Take 2 capsules by mouth  twice daily (Patient taking differently: Take 2 g by mouth 2 (two) times daily.), Disp: 120 capsule, Rfl: 0   I/O   No intake or output data in the 24 hours ending 03/07/20 1225   Physical Exam: Pulse Rate:  [81-95] 85 (01/17 1130) Resp:  [13-39] 18 (01/17 1130) BP: (101-142)/(71-97) 140/88 (01/17 1130) SpO2:  [91 %-97 %] 95 % (01/17 1130)  No data recorded. GENERAL: The patient is AO x3, in distress due to vomiting. HEENT: Head is normocephalic and atraumatic. EOMI are intact. Mouth is well hydrated and without lesions. NECK: Supple. No masses LUNGS: Clear to auscultation. No presence of rhonchi/wheezing/rales. Adequate chest expansion HEART: RRR, normal s1 and s2. ABDOMEN: Tender to palpation diffusely, tympanic and distended but not tense, no guarding, no peritoneal signs. BS +. No masses. Has midline scar. EXTREMITIES: Without any cyanosis, clubbing, rash, lesions or edema. NEUROLOGIC: AOx3, no focal motor deficit. SKIN: no jaundice, no rashes  Laboratory Data: CBC:     Component Value Date/Time   WBC 4.1 03/07/2020 0430   RBC 5.54 03/07/2020 0430   HGB 17.4 (H) 03/07/2020 0430   HGB 16.8 12/12/2016 1218   HCT 50.3 03/07/2020 0430   HCT 46.7 12/12/2016 1218   PLT 176 03/07/2020 0430   PLT 179 12/12/2016 1218   MCV 90.8 03/07/2020 0430   MCV 89.3 12/12/2016 1218   MCH 31.4 03/07/2020 0430   MCHC 34.6 03/07/2020 0430   RDW 11.9 03/07/2020 0430   RDW 12.3 12/12/2016 1218   LYMPHSABS 1.1 03/07/2020 0430   LYMPHSABS 2.7 12/12/2016 1218   MONOABS 0.6 03/07/2020 0430   MONOABS 0.6 12/12/2016 1218   EOSABS 0.0 03/07/2020 0430   EOSABS 0.1 12/12/2016 1218   BASOSABS 0.0 03/07/2020 0430   BASOSABS 0.0 12/12/2016 1218   COAG:  Lab Results  Component Value Date   INR 0.84 07/22/2014    BMP:  BMP Latest Ref Rng & Units 03/07/2020 03/06/2020 04/04/2019  Glucose 70 - 99 mg/dL 215(H) 232(H) 172(H)  BUN 8 - 23 mg/dL '18 14 16  ' Creatinine 0.61 - 1.24 mg/dL 0.68 0.81 0.86   Sodium 135 - 145 mmol/L 135 135 137  Potassium 3.5 - 5.1 mmol/L 4.0 4.1 4.1  Chloride 98 - 111 mmol/L 102 101 98  CO2 22 - 32 mmol/L '24 24 23  ' Calcium 8.9 - 10.3 mg/dL 9.0 9.7 9.8    HEPATIC:  Hepatic Function Latest Ref Rng & Units 03/07/2020 03/12/2020 04/04/2019  Total Protein 6.5 - 8.1 g/dL 7.2 8.2(H) 8.3(H)  Albumin 3.5 - 5.0 g/dL 4.2 5.0 4.8  AST 15 - 41 U/L '18 26 19  ' ALT 0 - 44 U/L '22 29 23  ' Alk Phosphatase 38 - 126 U/L 46 53 76  Total Bilirubin 0.3 - 1.2 mg/dL 1.5(H) 1.4(H) 1.7(H)  Bilirubin, Direct 0.00 - 0.40 mg/dL - - -  CARDIAC:  Lab Results  Component Value Date   TROPONINI <0.03 03/13/2016      Imaging: I personally reviewed and interpreted the available labs, imaging and endoscopic files.   Assessment/Plan: Briefly, this is a 60 year old male with past medical history of ulcerative pancolitis status post IPAA 20 years ago, coronary artery disease status post stent placement, diabetes, hypertension, gout, hyperlipidemia, who came to the hospital after presenting abdominal pain, distention and vomiting.  Patient has had multiple episodes of small bowel obstruction in the past which have been secondary to adhesions.  This time, the patient presents with imaging findings compatible with recurrent small bowel obstruction as there is presence of dilation of the small bowel with air-fluid levels but no presence of inflammatory findings or any transition point.  I personally reviewed these images and I could not see the presence of inflammation or focal area of obstruction.  The patient has not presented any improvement in the passage of gas or bowel movements.  General surgery was consulted and recommended an evaluation by her service.  Based on his previous episodes and the absence of inflammation on imaging films, I do not consider this is secondary to pouchitis or Crohn's disease but likely is related to adhesions.  Given the persistence of vomiting episodes, we will schedule  for a endoscopy tomorrow for placement of NG tube given the failed attempts in the past.  I discussed this with the wife who understood and agreed. Will order fractionated bilirubin to evaluate increase.  #Small bowel obstruction #History of ulcerative colitis status post IPAA #Elevated bilirubin - NPO for now - Endoscopic placement of NG tube tomorrow - c/w IV hydration - Follow up general surgery input regarding SBO - Check fractionated bilirubin - If worsening distention, obtain an abdominal KUB  Calvin Peppers, MD Gastroenterology and Hepatology [facility]

## 2020-03-07 NOTE — Progress Notes (Signed)
This nurse spoke to patient's wife, Joelene Millin, related to patient's care. Joelene Millin expressed concern that the Dilaudid that was ordered for patient was "stopping up his gut" and requested it be changed to Fentanyl. Dr. Carles Collet was made aware of the request and is to change the medication. Joelene Millin also requested that GI be consulted and I informed her that GI had been consulted and saw the patient yesterday in the ED due to the note from Dr. Gala Romney. Dr. Jenetta Downer made aware that wife would like to speak with him and acknowledged that he would give her a call. The patient was made aware of these updates with his care. Will continue to monitor.

## 2020-03-07 NOTE — Progress Notes (Signed)
Subjective: Patient still with nausea and abdominal bloating.  No NG tube in place.  Apparently, this was tried yesterday evening but was unsuccessful.  Objective: Vital signs in last 24 hours: Pulse Rate:  [81-95] 85 (01/17 1130) Resp:  [13-39] 18 (01/17 1130) BP: (101-142)/(71-97) 140/88 (01/17 1130) SpO2:  [91 %-97 %] 95 % (01/17 1130)    Intake/Output from previous day: 01/16 0701 - 01/17 0700 In: 100 [I.V.:100] Out: -  Intake/Output this shift: No intake/output data recorded.  General appearance: alert, cooperative and mild distress GI: Soft, mildly distended but not tense.  No bowel sounds appreciated.  Lab Results:  Recent Labs    02/26/2020 2240 03/07/20 0430  WBC 7.1 4.1  HGB 17.7* 17.4*  HCT 50.5 50.3  PLT 174 176   BMET Recent Labs    02/27/2020 2240 03/07/20 0430  NA 135 135  K 4.1 4.0  CL 101 102  CO2 24 24  GLUCOSE 232* 215*  BUN 14 18  CREATININE 0.81 0.68  CALCIUM 9.7 9.0   PT/INR No results for input(s): LABPROT, INR in the last 72 hours.  Studies/Results: CT ABDOMEN PELVIS W CONTRAST  Result Date: 03/06/2020 CLINICAL DATA:  Abdominal pain EXAM: CT ABDOMEN AND PELVIS WITH CONTRAST TECHNIQUE: Multidetector CT imaging of the abdomen and pelvis was performed using the standard protocol following bolus administration of intravenous contrast. CONTRAST:  143mL OMNIPAQUE IOHEXOL 300 MG/ML  SOLN COMPARISON:  April 04, 2019 FINDINGS: Lower chest: The visualized heart size within normal limits. No pericardial fluid/thickening. No hiatal hernia. The visualized portions of the lungs are clear. Hepatobiliary: The liver is normal in density without focal abnormality.The main portal vein is patent. The patient is status post cholecystectomy. No biliary ductal dilation. Pancreas: Unremarkable. No pancreatic ductal dilatation or surrounding inflammatory changes. Spleen: Normal in size without focal abnormality. Adrenals/Urinary Tract: Both adrenal glands appear  normal. The kidneys and collecting system appear normal without evidence of urinary tract calculus or hydronephrosis. Bladder is unremarkable. Stomach/Bowel: The stomach and proximal small bowel are unremarkable. There are mildly dilated air and fluid-filled loops of ileum measuring up to 3.2 cm. No clear transition point or focal wall narrowing is noted. No surrounding inflammatory changes are seen. There is been a prior colectomy with an ileocolonic anastomosis within the deep pelvis. Vascular/Lymphatic: There are no enlarged mesenteric, retroperitoneal, or pelvic lymph nodes. Scattered aortic atherosclerotic calcifications are seen without aneurysmal dilatation. Reproductive: The prostate is unremarkable. Other: No evidence of abdominal wall mass or hernia. Musculoskeletal: No acute or significant osseous findings. IMPRESSION: Mildly dilated fluid and air-filled ileal loops without a clear transition point. This is likely due to a partial small bowel obstruction. Prior colectomy with anastomosis within the deep pelvis. Aortic Atherosclerosis (ICD10-I70.0). Electronically Signed   By: Prudencio Pair M.D.   On: 03/06/2020 00:46   DG Abd 2 Views  Result Date: 03/07/2020 CLINICAL DATA:  Acute generalized abdominal pain. EXAM: ABDOMEN - 2 VIEW COMPARISON:  April 08, 2019.  March 06, 2020. FINDINGS: Moderately dilated small bowel loops are noted concerning for distal small bowel obstruction. No definite free air is noted. No colonic dilatation is noted. Status post cholecystectomy. No abnormal calcifications are noted. IMPRESSION: Moderately dilated small bowel loops are noted concerning for distal small bowel obstruction. Electronically Signed   By: Marijo Conception M.D.   On: 03/07/2020 09:32    Anti-infectives: Anti-infectives (From admission, onward)   None      Assessment/Plan: Impression: Bowel obstruction most likely secondary  to COVID positivity, ileus.  Mechanical obstruction less likely.   Unfortunately an NG tube cannot be placed.  No need for acute surgical intervention at this time.  Would keep n.p.o. for now.  LOS: 1 day    Aviva Signs 03/07/2020

## 2020-03-07 NOTE — Telephone Encounter (Signed)
Call from answering service. Patient Admitted to [facility] with SBO. I reviewed with the patient's wife that he has been seen by Dr. Melida Quitter and Rourk over the weekend.  She wants to know the plan for NG - tried and failed yesterday.  I asked that she call the floor and have the nurse ask Dr. Melida Quitter to call her and review plan.  I advised her I will let Dr. Hilarie Fredrickson know he is admitted.

## 2020-03-07 NOTE — Progress Notes (Signed)
PROGRESS NOTE  Calvin Cruz [id number] DOB: 1960-06-23 DOA: 03/16/2020 PCP: Reynold Bowen, MD Brief History:  60 year old male with a history of secondary polycythemia (JAK2 neg) , hyperlipidemia, diabetes mellitus type 2, hypertension, coronary artery disease status post NSTEMI with DES to the circumflex June 2016, ulcerative colitis status post proctocolectomy with ileoanal anastomosis, and recurrent episodes of pouchitis presenting with upper abdominal pain, nausea, vomiting, and loose stools that began on 03/04/2020. He states that he normally has 5-6 loose bowel movements on average day secondary to his ulcerative colitis.  He normally uses Imodium, usually 1 to 2 tablets on a daily basis.  His last bowel movement was on 03/17/2020.  He denies any fevers, chills, chest pain, shortness of breath, cough, hemoptysis.  He denies any headache, sore throat, hemoptysis.  He has had numerous episodes of emesis on 03/21/2020 resulting in dry heaving.  There is no hematemesis.  He denies hematochezia or melena. He has not had a bowel movement since the afternoon of 03/12/2020.  In the emergency department, the patient was afebrile hemodynamically stable with oxygen saturation 94-96% on room air.  BMP showed a sodium 135, potassium 4.1, BUN 14, serum creatinine 0.81.  LFTs were unremarkable.  Lipase 21.  WBC 7.1, hemoglobin 17.7, platelets 174,000. CT of the abdomen and pelvis showed mildly dilated air-fluid filled loops of ileum up to 3.2 cm.  There was no clear transition point or focal narrowing.  There is no surrounding inflammatory changes.  He is status post colectomy with ileal colonic anastomosis.  Assessment/Plan: Partial small bowel obstruction -Remain n.p.o. for now -IV fluids -Judicious opioids--switch to IV Dilaudid -Continue antiemetics around-the-clock -03/06/2020 CT abdomen/pelvis as discussed above -general surgery consult appreciated -he continues to have n/v  COVID-19  gastroenteritis/Infection -Patient was found to be incidentally COVID-19 positive by RT-PCR -pt's symptoms with minimal pulm symptoms of < 5 days duration with "high risk" criteria -discussed with spouse--defer remdesivir  -?pouchitis -Continue antiemetics -Stool pathogen panel--no BM to collect -continue PPI -Patient has not been vaccinated -GI consult ap[preciated -repeat 2 view abd  Coronary artery disease with history of NSTEMI -No chest pain presently -Restart Brilinta when able to tolerate p.o. -Patient follows with Dr. Glenetta Hew  Diabetes mellitus type 2, controlled -Restart Jardiance once able to tolerate p.o. -escalate sliding scale and change to q 4 hours  Hyperlipidemia -Restart Crestor and Vascepa once able to tolerate p.o.  Pituitary Tumor  -Found in 08/28/16 Brain MRI  -Will follow up with Dr. Forde Dandy.   Status is: Inpatient  Remains inpatient appropriate because:IV treatments appropriate due to intensity of illness or inability to take PO   Dispo: The patient is from: Home  Anticipated d/c is to: Home  Anticipated d/c date is: 3 days  Patient currently is not medically stable to d/c.        Family Communication:   spouse updated 03/07/20  Consultants:  GI, general surgery  Code Status:  FULL  DVT Prophylaxis:   [facility] Lovenox   Procedures: As Listed in Progress Note Above  Antibiotics: None    Subjective: He continues to have n/v and abd pain.  Denies cp, but having some intermittent sob.  Denies cough, hemoptysis.  No BM nor flatus since yesterday.  Denies dysuria, hematuria  Objective: Vitals:   03/07/20 0200 03/07/20 0430 03/07/20 0700 03/07/20 0730  BP: 127/80 (!) 135/97 (!) 142/95 129/87  Pulse: 88 95 91 95  Resp: (!) 21  19  19  Temp:      TempSrc:      SpO2: 96% 92% 91% 93%  Weight:      Height:       No intake or output data in the 24 hours ending 03/07/20  0910 Weight change:  Exam:   General:  Pt is alert, follows commands appropriately, not in acute distress  HEENT: No icterus, No thrush, No neck mass, [place]/AT  Cardiovascular: RRR, S1/S2, no rubs, no gallops  Respiratory: CTA bilaterally, no wheezing, no crackles, no rhonchi  Abdomen: Soft/+BS, diffusely tender, mildly distended, no guarding  Extremities: No edema, No lymphangitis, No petechiae, No rashes, no synovitis   Data Reviewed: I have personally reviewed following labs and imaging studies Basic Metabolic Panel: Recent Labs  Lab 03/14/2020 2240 03/07/20 0430  NA 135 135  K 4.1 4.0  CL 101 102  CO2 24 24  GLUCOSE 232* 215*  BUN 14 18  CREATININE 0.81 0.68  CALCIUM 9.7 9.0  MG  --  1.8  PHOS  --  2.7   Liver Function Tests: Recent Labs  Lab 02/26/2020 2240 03/07/20 0430  AST 26 18  ALT 29 22  ALKPHOS 53 46  BILITOT 1.4* 1.5*  PROT 8.2* 7.2  ALBUMIN 5.0 4.2   Recent Labs  Lab 03/01/2020 2240  LIPASE 21   No results for input(s): AMMONIA in the last 168 hours. Coagulation Profile: No results for input(s): INR, PROTIME in the last 168 hours. CBC: Recent Labs  Lab 03/13/2020 2240 03/07/20 0430  WBC 7.1 4.1  NEUTROABS 5.3 2.4  HGB 17.7* 17.4*  HCT 50.5 50.3  MCV 90.2 90.8  PLT 174 176   Cardiac Enzymes: No results for input(s): CKTOTAL, CKMB, CKMBINDEX, TROPONINI in the last 168 hours. BNP: Invalid input(s): POCBNP CBG: Recent Labs  Lab 03/06/20 0742 03/06/20 1126 03/06/20 1739 03/07/20 0851  GLUCAP 194* 189* 202* 205*   HbA1C: Recent Labs    03/04/2020 2240  HGBA1C 6.8*   Urine analysis:    Component Value Date/Time   COLORURINE YELLOW 04/04/2019 1623   APPEARANCEUR CLEAR 04/04/2019 1623   LABSPEC 1.038 (H) 04/04/2019 1623   PHURINE 5.0 04/04/2019 1623   GLUCOSEU >=500 (A) 04/04/2019 1623   HGBUR NEGATIVE 04/04/2019 1623   BILIRUBINUR NEGATIVE 04/04/2019 1623   KETONESUR 20 (A) 04/04/2019 1623   PROTEINUR NEGATIVE 04/04/2019 1623    NITRITE NEGATIVE 04/04/2019 1623   LEUKOCYTESUR NEGATIVE 04/04/2019 1623   Sepsis Labs: @LABRCNTIP (procalcitonin:4,lacticidven:4) ) Recent Results (from the past 240 hour(s))  Resp Panel by RT-PCR (Flu A&B, Covid) Nasopharyngeal Swab     Status: Abnormal   Collection Time: 03/04/2020 10:28 PM   Specimen: Nasopharyngeal Swab; Nasopharyngeal(NP) swabs in vial transport medium  Result Value Ref Range Status   SARS Coronavirus 2 by RT PCR POSITIVE (A) NEGATIVE Final    Comment: RESULT CALLED TO, READ BACK BY AND VERIFIED WITH: M BRAIN AT 0018 ON 03/06/2020 BY JPM (NOTE) SARS-CoV-2 target nucleic acids are DETECTED.  The SARS-CoV-2 RNA is generally detectable in upper respiratory specimens during the acute phase of infection. Positive results are indicative of the presence of the identified virus, but do not rule out bacterial infection or co-infection with other pathogens not detected by the test. Clinical correlation with patient history and other diagnostic information is necessary to determine patient infection status. The expected result is Negative.  Fact Sheet for Patients: EntrepreneurPulse.com.au  Fact Sheet for Healthcare Providers: IncredibleEmployment.be  This test is not yet  approved or cleared by the [place] and  has been authorized for detection and/or diagnosis of SARS-CoV-2 by FDA under an Emergency Use Authorization (EUA).  This EUA will remain in effect (meaning this test can  be used) for the duration of  the COVID-19 declaration under Section 564(b)(1) of the Act, 21 U.S.C. section 360bbb-3(b)(1), unless the authorization is terminated or revoked sooner.     Influenza A by PCR NEGATIVE NEGATIVE Final   Influenza B by PCR NEGATIVE NEGATIVE Final    Comment: (NOTE) The Xpert Xpress SARS-CoV-2/FLU/RSV plus assay is intended as an aid in the diagnosis of influenza from Nasopharyngeal swab specimens and should not be  used as a sole basis for treatment. Nasal washings and aspirates are unacceptable for Xpert Xpress SARS-CoV-2/FLU/RSV testing.  Fact Sheet for Patients: EntrepreneurPulse.com.au  Fact Sheet for Healthcare Providers: IncredibleEmployment.be  This test is not yet approved or cleared by the [place] FDA and has been authorized for detection and/or diagnosis of SARS-CoV-2 by FDA under an Emergency Use Authorization (EUA). This EUA will remain in effect (meaning this test can be used) for the duration of the COVID-19 declaration under Section 564(b)(1) of the Act, 21 U.S.C. section 360bbb-3(b)(1), unless the authorization is terminated or revoked.  Performed at [facility], [address] [place], [place] [address]      Scheduled Meds: . enoxaparin (LOVENOX) injection  40 mg Subcutaneous Q24H  . insulin aspart  0-9 Units Subcutaneous TID WC  . ondansetron (ZOFRAN) IV  4 mg Intravenous Q6H  . pantoprazole (PROTONIX) IV  40 mg Intravenous Q12H   Continuous Infusions: . lactated ringers 100 mL/hr at 03/07/20 0553    Procedures/Studies: CT ABDOMEN PELVIS W CONTRAST  Result Date: 03/06/2020 CLINICAL DATA:  Abdominal pain EXAM: CT ABDOMEN AND PELVIS WITH CONTRAST TECHNIQUE: Multidetector CT imaging of the abdomen and pelvis was performed using the standard protocol following bolus administration of intravenous contrast. CONTRAST:  158mL OMNIPAQUE IOHEXOL 300 MG/ML  SOLN COMPARISON:  April 04, 2019 FINDINGS: Lower chest: The visualized heart size within normal limits. No pericardial fluid/thickening. No hiatal hernia. The visualized portions of the lungs are clear. Hepatobiliary: The liver is normal in density without focal abnormality.The main portal vein is patent. The patient is status post cholecystectomy. No biliary ductal dilation. Pancreas: Unremarkable. No pancreatic ductal dilatation or surrounding inflammatory changes. Spleen: Normal in  size without focal abnormality. Adrenals/Urinary Tract: Both adrenal glands appear normal. The kidneys and collecting system appear normal without evidence of urinary tract calculus or hydronephrosis. Bladder is unremarkable. Stomach/Bowel: The stomach and proximal small bowel are unremarkable. There are mildly dilated air and fluid-filled loops of ileum measuring up to 3.2 cm. No clear transition point or focal wall narrowing is noted. No surrounding inflammatory changes are seen. There is been a prior colectomy with an ileocolonic anastomosis within the deep pelvis. Vascular/Lymphatic: There are no enlarged mesenteric, retroperitoneal, or pelvic lymph nodes. Scattered aortic atherosclerotic calcifications are seen without aneurysmal dilatation. Reproductive: The prostate is unremarkable. Other: No evidence of abdominal wall mass or hernia. Musculoskeletal: No acute or significant osseous findings. IMPRESSION: Mildly dilated fluid and air-filled ileal loops without a clear transition point. This is likely due to a partial small bowel obstruction. Prior colectomy with anastomosis within the deep pelvis. Aortic Atherosclerosis (ICD10-I70.0). Electronically Signed   By: Prudencio Pair M.D.   On: 03/06/2020 00:46    Orson Eva, DO  [facility]  If 7PM-7AM, please contact night-coverage [email or web address] Password [facility] 03/07/2020,  9:10 AM   LOS: 1 day

## 2020-03-08 ENCOUNTER — Inpatient Hospital Stay (HOSPITAL_COMMUNITY): Payer: Commercial Managed Care - PPO | Admitting: Anesthesiology

## 2020-03-08 ENCOUNTER — Encounter (HOSPITAL_COMMUNITY): Admission: EM | Disposition: E | Payer: Self-pay | Source: Home / Self Care | Attending: Internal Medicine

## 2020-03-08 DIAGNOSIS — K56600 Partial intestinal obstruction, unspecified as to cause: Secondary | ICD-10-CM | POA: Diagnosis not present

## 2020-03-08 DIAGNOSIS — K56609 Unspecified intestinal obstruction, unspecified as to partial versus complete obstruction: Secondary | ICD-10-CM | POA: Diagnosis not present

## 2020-03-08 DIAGNOSIS — A0839 Other viral enteritis: Secondary | ICD-10-CM | POA: Diagnosis not present

## 2020-03-08 DIAGNOSIS — U071 COVID-19: Secondary | ICD-10-CM | POA: Diagnosis not present

## 2020-03-08 HISTORY — PX: ESOPHAGOGASTRODUODENOSCOPY (EGD) WITH PROPOFOL: SHX5813

## 2020-03-08 LAB — CBC WITH DIFFERENTIAL/PLATELET
Abs Immature Granulocytes: 0.02 10*3/uL (ref 0.00–0.07)
Basophils Absolute: 0 10*3/uL (ref 0.0–0.1)
Basophils Relative: 0 %
Eosinophils Absolute: 0 10*3/uL (ref 0.0–0.5)
Eosinophils Relative: 0 %
HCT: 48.3 % (ref 39.0–52.0)
Hemoglobin: 16.4 g/dL (ref 13.0–17.0)
Immature Granulocytes: 1 %
Lymphocytes Relative: 20 %
Lymphs Abs: 0.8 10*3/uL (ref 0.7–4.0)
MCH: 31.2 pg (ref 26.0–34.0)
MCHC: 34 g/dL (ref 30.0–36.0)
MCV: 92 fL (ref 80.0–100.0)
Monocytes Absolute: 0.7 10*3/uL (ref 0.1–1.0)
Monocytes Relative: 16 %
Neutro Abs: 2.6 10*3/uL (ref 1.7–7.7)
Neutrophils Relative %: 63 %
Platelets: 164 10*3/uL (ref 150–400)
RBC: 5.25 MIL/uL (ref 4.22–5.81)
RDW: 12.3 % (ref 11.5–15.5)
WBC: 4.1 10*3/uL (ref 4.0–10.5)
nRBC: 0 % (ref 0.0–0.2)

## 2020-03-08 LAB — GLUCOSE, CAPILLARY
Glucose-Capillary: 191 mg/dL — ABNORMAL HIGH (ref 70–99)
Glucose-Capillary: 181 mg/dL — ABNORMAL HIGH (ref 70–99)
Glucose-Capillary: 193 mg/dL — ABNORMAL HIGH (ref 70–99)
Glucose-Capillary: 173 mg/dL — ABNORMAL HIGH (ref 70–99)
Glucose-Capillary: 172 mg/dL — ABNORMAL HIGH (ref 70–99)
Glucose-Capillary: 156 mg/dL — ABNORMAL HIGH (ref 70–99)
Glucose-Capillary: 171 mg/dL — ABNORMAL HIGH (ref 70–99)

## 2020-03-08 LAB — COMPREHENSIVE METABOLIC PANEL
ALT: 20 U/L (ref 0–44)
AST: 19 U/L (ref 15–41)
Albumin: 3.9 g/dL (ref 3.5–5.0)
Alkaline Phosphatase: 41 U/L (ref 38–126)
Anion gap: 12 (ref 5–15)
BUN: 17 mg/dL (ref 8–23)
CO2: 30 mmol/L (ref 22–32)
Calcium: 9.1 mg/dL (ref 8.9–10.3)
Chloride: 96 mmol/L — ABNORMAL LOW (ref 98–111)
Creatinine, Ser: 0.66 mg/dL (ref 0.61–1.24)
GFR, Estimated: >60 mL/min (ref >60)
Glucose, Bld: 183 mg/dL — ABNORMAL HIGH (ref 70–99)
Potassium: 3.6 mmol/L (ref 3.5–5.1)
Sodium: 138 mmol/L (ref 135–145)
Total Bilirubin: 1.3 mg/dL — ABNORMAL HIGH (ref 0.3–1.2)
Total Protein: 6.6 g/dL (ref 6.5–8.1)

## 2020-03-08 LAB — MAGNESIUM: Magnesium: 1.8 mg/dL (ref 1.7–2.4)

## 2020-03-08 LAB — PHOSPHORUS: Phosphorus: 2.8 mg/dL (ref 2.5–4.6)

## 2020-03-08 LAB — D-DIMER, QUANTITATIVE: D-Dimer, Quant: 0.64 ug/mL-FEU — ABNORMAL HIGH (ref 0.00–0.50)

## 2020-03-08 LAB — C-REACTIVE PROTEIN: CRP: 2 mg/dL — ABNORMAL HIGH (ref <1.0)

## 2020-03-08 LAB — FERRITIN: Ferritin: 375 ng/mL — ABNORMAL HIGH (ref 24–336)

## 2020-03-08 SURGERY — ESOPHAGOGASTRODUODENOSCOPY (EGD) WITH PROPOFOL
Anesthesia: General

## 2020-03-08 SURGERY — ESOPHAGOGASTRODUODENOSCOPY (EGD) WITH PROPOFOL
Anesthesia: Monitor Anesthesia Care

## 2020-03-08 MED ORDER — PHENYLEPHRINE HCL (PRESSORS) 10 MG/ML IV SOLN
INTRAVENOUS | Status: DC | PRN
  Administered 2020-03-08: 80 ug via INTRAVENOUS

## 2020-03-08 MED ORDER — SUCCINYLCHOLINE CHLORIDE 200 MG/10ML IV SOSY
PREFILLED_SYRINGE | INTRAVENOUS | Status: AC
  Filled 2020-03-08: qty 10

## 2020-03-08 MED ORDER — STERILE WATER FOR IRRIGATION IR SOLN
Status: DC | PRN
  Administered 2020-03-08: 1.5 mL

## 2020-03-08 MED ORDER — MEPERIDINE HCL 50 MG/ML IJ SOLN: 6.2500 mg | INTRAMUSCULAR | Status: DC | PRN

## 2020-03-08 MED ORDER — SUCCINYLCHOLINE CHLORIDE 20 MG/ML IJ SOLN
INTRAMUSCULAR | Status: DC | PRN
  Administered 2020-03-08: 200 mg via INTRAVENOUS

## 2020-03-08 MED ORDER — ONDANSETRON HCL 4 MG/2ML IJ SOLN
INTRAMUSCULAR | Status: AC
  Filled 2020-03-08: qty 2

## 2020-03-08 MED ORDER — PROPOFOL 10 MG/ML IV BOLUS
INTRAVENOUS | Status: DC | PRN
  Administered 2020-03-08: 20 mg via INTRAVENOUS
  Administered 2020-03-08: 200 mg via INTRAVENOUS

## 2020-03-08 MED ORDER — ONDANSETRON HCL 4 MG/2ML IJ SOLN
4.0000 mg | Freq: Once | INTRAMUSCULAR | Status: AC | PRN
  Administered 2020-03-08: 4 mg via INTRAVENOUS

## 2020-03-08 MED ORDER — LIDOCAINE HCL (CARDIAC) PF 50 MG/5ML IV SOSY
PREFILLED_SYRINGE | INTRAVENOUS | Status: DC | PRN
  Administered 2020-03-08: 100 mg via INTRAVENOUS

## 2020-03-08 MED ORDER — FENTANYL CITRATE (PF) 100 MCG/2ML IJ SOLN
25.0000 ug | INTRAMUSCULAR | Status: DC | PRN
Start: 2020-03-08 — End: 2020-03-08

## 2020-03-08 NOTE — Telephone Encounter (Signed)
Agree Sorry to hear he is admitted, but I must defer to the doctors directly taking care of him during this admission

## 2020-03-08 NOTE — Anesthesia Postprocedure Evaluation (Signed)
Anesthesia Post Note  Patient: Calvin Cruz  Procedure(s) Performed: ESOPHAGOGASTRODUODENOSCOPY (EGD) WITH PROPOFOL WITH PLACEMENT OF NASO-GASTRIC TUBE (N/A )  Patient location during evaluation: Endoscopy Anesthesia Type: General Level of consciousness: awake and alert and patient cooperative Pain management: satisfactory to patient Vital Signs Assessment: post-procedure vital signs reviewed and stable Respiratory status: spontaneous breathing and patient connected to nasal cannula oxygen Cardiovascular status: stable Postop Assessment: no apparent nausea or vomiting Anesthetic complications: no   No complications documented.   Last Vitals:  Vitals:   02/26/2020 0810 03/15/2020 1446  BP: 126/86 (!) 145/94  Pulse: 82 91  Resp: 18 18  Temp: 37.1 C   SpO2: 97% 96%    Last Pain:  Vitals:   03/15/2020 1446  TempSrc:   PainSc: 0-No pain                 Courtney Fenlon

## 2020-03-08 NOTE — Op Note (Signed)
[facility] Patient Name: Calvin Cruz Procedure Date: 03/01/2020 1:56 PM MRN: [id number] Date of Birth: 1960/05/27 Attending MD: Maylon Peppers ,  CSN: [id number] Age: 60 Admit Type: Inpatient Procedure:                Upper GI endoscopy Indications:              Small bowel obstruction, NGT placement Providers:                Maylon Peppers, Charlsie Quest. Theda Sers RN, RN, Raphael Gibney, Technician Referring MD:              Medicines:                Monitored Anesthesia Care Complications:            No immediate complications. Estimated Blood Loss:     Estimated blood loss: none. Procedure:                Pre-Anesthesia Assessment:                           - Prior to the procedure, a History and Physical                            was performed, and patient medications, allergies                            and sensitivities were reviewed. The patient's                            tolerance of previous anesthesia was reviewed.                           - The risks and benefits of the procedure and the                            sedation options and risks were discussed with the                            patient. All questions were answered and informed                            consent was obtained.                           - ASA Grade Assessment: III - A patient with severe                            systemic disease.                           After obtaining informed consent, the endoscope was                            passed under direct vision. Throughout the  procedure, the patient's blood pressure, pulse, and                            oxygen saturations were monitored continuously. The                            [id number]-[id number] ([id number]) scope was introduced through                            the right nostril, and advanced to the second part                            of duodenum. The upper GI endoscopy was                             accomplished without difficulty. The patient                            tolerated the procedure well. Scope In: 2:11:05 PM Scope Out: 2:32:22 PM Total Procedure Duration: 0 hours 21 minutes 17 seconds  Findings:      The examined esophagus was normal.      Bilious fluid was found in the gastric body. Fluid aspiration was       performed. A metallic guidewire was inserted through the scope up to the       antrum. The scope was removed and the guidewire was left in place. A 16       Fr NG tube was inserted thrugh the nose via guidewire guidance. The       guidewire was removed. Upon reinspection with the scope, the NG tube was       visualized in the gastric chamber.      The examined duodenum was normal. Impression:               - Normal esophagus.                           - Bilious gastric fluid. Fluid aspiration                            performed. NG tube inserted to the stomach.                           - Normal examined duodenum. Moderate Sedation:      Per Anesthesia Care Recommendation:            Procedure Code(s):        --- Professional ---                           [phone], Esophagoscopy, flexible, transnasal;                            diagnostic, including collection of specimen(s) by                            brushing or washing, when performed (separate  procedure) Diagnosis Code(s):        --- Professional ---                           K56.609, Unspecified intestinal obstruction,                            unspecified as to partial versus complete                            obstruction CPT copyright 2019 American Medical Association. All rights reserved. The codes documented in this report are preliminary and upon coder review may  be revised to meet current compliance requirements. Maylon Peppers, MD Maylon Peppers,  03/15/20 3:02:28 PM This report has been signed electronically. Number of Addenda: 0

## 2020-03-08 NOTE — Anesthesia Preprocedure Evaluation (Addendum)
Anesthesia Evaluation  Patient identified by MRN, date of birth, ID band Patient awake    Reviewed: Allergy & Precautions, NPO status , Patient's Chart, lab work & pertinent test results  History of Anesthesia Complications Negative for: history of anesthetic complications  Airway Mallampati: II  TM Distance: >3 FB Neck ROM: Full    Dental  (+) Teeth Intact, Dental Advisory Given   Pulmonary former smoker,    Pulmonary exam normal        Cardiovascular Exercise Tolerance: Good hypertension, Pt. on medications and Pt. on home beta blockers + CAD, + Past MI and + Cardiac Stents  Normal cardiovascular exam     Neuro/Psych negative neurological ROS  negative psych ROS   GI/Hepatic PUD, GERD  Medicated,Ulcerative colitis    Endo/Other  diabetes  Renal/GU Renal disease     Musculoskeletal   Abdominal   Peds  Hematology  (+) Blood dyscrasia (polycythemia vera), ,   Anesthesia Other Findings   Reproductive/Obstetrics                            Anesthesia Physical Anesthesia Plan  ASA: IV and emergent  Anesthesia Plan: General   Post-op Pain Management:    Induction: Intravenous, Rapid sequence and Cricoid pressure planned  PONV Risk Score and Plan: Ondansetron  Airway Management Planned: Oral ETT  Additional Equipment:   Intra-op Plan:   Post-operative Plan: Extubation in OR  Informed Consent: I have reviewed the patients History and Physical, chart, labs and discussed the procedure including the risks, benefits and alternatives for the proposed anesthesia with the patient or authorized representative who has indicated his/her understanding and acceptance.     Dental advisory given  Plan Discussed with: CRNA and Surgeon  Anesthesia Plan Comments:        Anesthesia Quick Evaluation

## 2020-03-08 NOTE — Progress Notes (Signed)
Progress Note:   Saw patient briefly today prior to scheduled EGD.   Patient sleeping when I walked in the room. Aroused easily but keeps his eyes closed during much of our conversation. He is oriented x3. Reports he continues with 8/10 abdominal pain. Reports having several episodes of emesis yesterday. 1 episode of emesis this morning. Ongoing nausea. He did have 2 small liquid BMs this morning. Overall doesn't feel any better today than yesterday.  Continues to require frequent pain medications and antiemetics.  Not ready to try clear liquids. Would like to proceed with EGD with placement of NG tube.   Spoke with patients nurse who agrees patient doesn't seem to be improving. Frequently asking for pain medications every hour.    Abdominal exam:  Bowel sounds present. Abdomen is distended and somewhat tense. Generalized tenderness to palpation. No rebound. No masses appreciated   We will proceed with EGD with NG tube placement today with propofol as scheduled with Dr. Jenetta Downer.   Aliene Altes, PA-C [facility]

## 2020-03-08 NOTE — Progress Notes (Signed)
  Subjective: Patient states he is passing gas and starting to have watery bowel movements.  He still has some abdominal pain and distention.  He would like to start a clear liquid diet.  Objective: Vital signs in last 24 hours: Temp:  [98.5 F (36.9 C)-99.4 F (37.4 C)] 98.7 F (37.1 C) (01/18 0810) Pulse Rate:  [82-99] 82 (01/18 0810) Resp:  [16-18] 18 (01/18 0810) BP: (120-149)/(67-93) 126/86 (01/18 0810) SpO2:  [91 %-98 %] 97 % (01/18 0810) Last BM Date:  (patient does not recall)  Intake/Output from previous day: 01/17 0701 - 01/18 0700 In: 2863.1 [I.V.:2863.1] Out: -  Intake/Output this shift: No intake/output data recorded.  General appearance: alert, cooperative and no distress GI: Distended but soft.  Bowel sounds present.  No rigidity is noted.  Lab Results:  Recent Labs    03/07/20 0430 03/17/2020 0436  WBC 4.1 4.1  HGB 17.4* 16.4  HCT 50.3 48.3  PLT 176 164   BMET Recent Labs    03/07/20 0430 03/04/2020 0436  NA 135 138  K 4.0 3.6  CL 102 96*  CO2 24 30  GLUCOSE 215* 183*  BUN 18 17  CREATININE 0.68 0.66  CALCIUM 9.0 9.1   PT/INR No results for input(s): LABPROT, INR in the last 72 hours.  Studies/Results: DG Abd 2 Views  Result Date: 03/07/2020 CLINICAL DATA:  Acute generalized abdominal pain. EXAM: ABDOMEN - 2 VIEW COMPARISON:  April 08, 2019.  March 06, 2020. FINDINGS: Moderately dilated small bowel loops are noted concerning for distal small bowel obstruction. No definite free air is noted. No colonic dilatation is noted. Status post cholecystectomy. No abnormal calcifications are noted. IMPRESSION: Moderately dilated small bowel loops are noted concerning for distal small bowel obstruction. Electronically Signed   By: Marijo Conception M.D.   On: 03/07/2020 09:32    Anti-infectives: Anti-infectives (From admission, onward)   None      Assessment/Plan: Impression: Bowel ileus, clinically starting to resolve.  Patient still has  abdominal distention but hopefully this will resolve with time.  He would like to start a clear liquid diet which is fine with me.  We may advance him to full liquids at his request.  No need for acute surgical intervention.  We will follow with you.  LOS: 2 days    Aviva Signs 02/22/2020

## 2020-03-08 NOTE — Progress Notes (Signed)
Pt down to endo for procedures via stretcher by endo staff.

## 2020-03-08 NOTE — Progress Notes (Signed)
Patient is back in the room from Endo at this time. Patient is complaining of pain that is a 10 on a scale of 0-10. Patients vitals are stable other than BP elevated 144/82. Output from the NG tube is brown in color.

## 2020-03-08 NOTE — Transfer of Care (Signed)
Immediate Anesthesia Transfer of Care Note  Patient: Calvin Cruz  Procedure(s) Performed: ESOPHAGOGASTRODUODENOSCOPY (EGD) WITH PROPOFOL WITH PLACEMENT OF NASO-GASTRIC TUBE (N/A )  Patient Location: Endoscopy Unit  Anesthesia Type:General  Level of Consciousness: awake, alert  and patient cooperative  Airway & Oxygen Therapy: Patient Spontanous Breathing and Patient connected to nasal cannula oxygen  Post-op Assessment: Report given to RN and Post -op Vital signs reviewed and stable  Post vital signs: Reviewed and stable  Last Vitals:  Vitals Value Taken Time  BP    Temp    Pulse    Resp    SpO2    SEE PACU FLOW SHEET FOR VITAL SIGNS  Last Pain:  Vitals:   02/21/2020 1218  TempSrc:   PainSc: 8          Complications: No complications documented.

## 2020-03-08 NOTE — Progress Notes (Addendum)
PROGRESS NOTE  Calvin Cruz RFF:[id number] DOB: [birth year] DOA: 04-Apr-2020 PCP: Reynold Bowen, MD  Brief History:  60 year old male with a history ofsecondarypolycythemia(JAK2 neg), hyperlipidemia, diabetes mellitus type 2, hypertension, coronary artery disease status post NSTEMI with DES to the circumflex June 2016, ulcerative colitis status post proctocolectomy with ileoanal anastomosis, and recurrent episodes of pouchitis presenting with upper abdominal pain, nausea, vomiting, and loose stools that began on 03/04/2020. He states that he normally has 5-6 loose bowel movements on average day secondary to his ulcerative colitis. He normally uses Imodium, usually 1 to 2 tablets on a daily basis. His last bowel movement was on 04-04-2020. He denies any fevers, chills, chest pain, shortness of breath, cough, hemoptysis. He denies any headache, sore throat, hemoptysis. He has had numerous episodes of emesis on 04/04/20 resulting in dry heaving. There is no hematemesis. He denies hematochezia or melena.He has not had a bowel movement since the afternoon of 03/12/2020. In the emergency department, the patient was afebrile hemodynamically stable with oxygen saturation 94-96% on room air. BMP showed a sodium 135, potassium 4.1, BUN 14, serum creatinine 0.81. LFTs were unremarkable. Lipase 21. WBC 7.1, hemoglobin 17.7, platelets 174,000. CT of the abdomen and pelvis showed mildly dilated air-fluid filled loops of ileum up to 3.2 cm. There was no clear transition point or focal narrowing. There is no surrounding inflammatory changes. He is status post colectomy with ileal colonic anastomosis.  Assessment/Plan: Partial small bowel obstruction -Remain n.p.o. for now -IV fluids--continue -Judicious opioids--switch to IV Dilaudid but spouse demands he be switched back to fentanyl due her concerns of dilaudid causing bowel obstruction -Continue antiemetics  around-the-clock -03/06/2020 CT abdomen/pelvis as discussed above -general surgery consult appreciated -he continued to have n/v -03/07/20 AXR--Moderately dilated small bowel loops are noted -02/21/2020-endoscopically guided NG placement -put NG to suction -repeat AXR in am  COVID-19 gastroenteritis/Infection -Patient was found to be incidentally COVID-19 positive by RT-PCR -pt's symptoms with minimal pulm symptoms of < 5 days duration with "high risk" criteria--stable on RA -discussed with spouse--defer remdesivir  -doubt pouchitis with no inflammatory changes on CT abd -Continue antiemetics -Stool pathogen panel--no BM to collect in first 48 hours -one soft BM 03/07/20 evening -continue PPI -Patient has not been vaccinated -GI consult appreciated --03/07/20 AXR--Moderately dilated small bowel loops are noted -CRP--3.3>>2.0 -D-dimer 0.40>>0.64 -ferritin 271>>375  Coronary artery disease with history of NSTEMI -No chest pain presently -Restart Brilinta when able to tolerate p.o. -Patient follows with Dr. Glenetta Hew  Diabetes mellitus type 2, controlled -Restart Jardiance once able to tolerate p.o. -escalate sliding scale and change to q 4 hours -03/07/20 A1C--6.7  Hyperlipidemia -Restart Crestor and Vascepa once able to tolerate p.o.  Pituitary Tumor  -Found in 08/28/16 Brain MRI  -Will follow up with Dr. Forde Dandy.   Status is: Inpatient  Remains inpatient appropriate because:IV treatments appropriate due to intensity of illness or inability to take PO   Dispo: The patient is from:Home Anticipated d/c is JT:TSVX Anticipated d/c date is: 3 days Patient currently is not medically stable to d/c.        Family Communication:spouse updated 03/07/2020  Consultants:GI, general surgery  Code Status: FULL  DVT Prophylaxis: Pinson Lovenox   Procedures: As Listed in Progress Note  Above  Antibiotics: None        Subjective: Patient feels abd pain and distension are a little better today.  Denies f/c, cp, sob,  States n/v are a little  better but still dry heaving before NG placed.  Had one small BM evening 1/17  Objective: Vitals:   03/16/2020 0500 02/21/2020 0810 03/18/2020 1446 03/03/2020 1500  BP: 120/70 126/86 (!) 145/94 (!) 142/90  Pulse: 90 82 91 88  Resp: 18 18 18 18   Temp: 98.5 F (36.9 C) 98.7 F (37.1 C)    TempSrc: Oral Oral    SpO2: 95% 97% 96% 92%  Weight:      Height:        Intake/Output Summary (Last 24 hours) at 03/07/2020 1520 Last data filed at 02/23/2020 1437 Gross per 24 hour  Intake 3563.14 ml  Output --  Net 3563.14 ml   Weight change:  Exam:   General:  Pt is alert, follows commands appropriately, not in acute distress  HEENT: No icterus, No thrush, No neck mass, [place]/AT  Cardiovascular: RRR, S1/S2, no rubs, no gallops  Respiratory:bibasilar rales. No wheeze  Abdomen: Soft/+BS, diffuse tender, mildly distended, no guarding  Extremities: No edema, No lymphangitis, No petechiae, No rashes, no synovitis   Data Reviewed: I have personally reviewed following labs and imaging studies Basic Metabolic Panel: Recent Labs  Lab 03/02/2020 2240 03/07/20 0430 02/28/2020 0436  NA 135 135 138  K 4.1 4.0 3.6  CL 101 102 96*  CO2 24 24 30   GLUCOSE 232* 215* 183*  BUN 14 18 17   CREATININE 0.81 0.68 0.66  CALCIUM 9.7 9.0 9.1  MG  --  1.8 1.8  PHOS  --  2.7 2.8   Liver Function Tests: Recent Labs  Lab 03/20/2020 2240 03/07/20 0430 03/15/2020 0436  AST 26 18 19   ALT 29 22 20   ALKPHOS 53 46 41  BILITOT 1.4* 1.5* 1.3*  PROT 8.2* 7.2 6.6  ALBUMIN 5.0 4.2 3.9   Recent Labs  Lab 02/27/2020 2240  LIPASE 21   No results for input(s): AMMONIA in the last 168 hours. Coagulation Profile: No results for input(s): INR, PROTIME in the last 168 hours. CBC: Recent Labs  Lab 03/15/2020 2240 03/07/20 0430 03/07/2020 0436  WBC 7.1 4.1  4.1  NEUTROABS 5.3 2.4 2.6  HGB 17.7* 17.4* 16.4  HCT 50.5 50.3 48.3  MCV 90.2 90.8 92.0  PLT 174 176 164   Cardiac Enzymes: No results for input(s): CKTOTAL, CKMB, CKMBINDEX, TROPONINI in the last 168 hours. BNP: Invalid input(s): POCBNP CBG: Recent Labs  Lab 03/07/20 2348 03/13/2020 0408 02/23/2020 0740 02/22/2020 1007 03/08/20 1344  GLUCAP 179* 181* 193* 173* 172*   HbA1C: Recent Labs    03/15/2020 2240 03/07/20 0430  HGBA1C 6.8* 6.7*   Urine analysis:    Component Value Date/Time   COLORURINE YELLOW 04/04/2019 1623   APPEARANCEUR CLEAR 04/04/2019 1623   LABSPEC 1.038 (H) 04/04/2019 1623   PHURINE 5.0 04/04/2019 1623   GLUCOSEU >=500 (A) 04/04/2019 1623   HGBUR NEGATIVE 04/04/2019 1623   BILIRUBINUR NEGATIVE 04/04/2019 1623   KETONESUR 20 (A) 04/04/2019 1623   PROTEINUR NEGATIVE 04/04/2019 1623   NITRITE NEGATIVE 04/04/2019 1623   LEUKOCYTESUR NEGATIVE 04/04/2019 1623   Sepsis Labs: @LABRCNTIP (procalcitonin:4,lacticidven:4) ) Recent Results (from the past 240 hour(s))  Resp Panel by RT-PCR (Flu A&B, Covid) Nasopharyngeal Swab     Status: Abnormal   Collection Time: 03/01/2020 10:28 PM   Specimen: Nasopharyngeal Swab; Nasopharyngeal(NP) swabs in vial transport medium  Result Value Ref Range Status   SARS Coronavirus 2 by RT PCR POSITIVE (A) NEGATIVE Final    Comment: RESULT CALLED TO, READ BACK BY AND VERIFIED WITH: M  BRAIN AT 0018 ON 03/06/2020 BY JPM (NOTE) SARS-CoV-2 target nucleic acids are DETECTED.  The SARS-CoV-2 RNA is generally detectable in upper respiratory specimens during the acute phase of infection. Positive results are indicative of the presence of the identified virus, but do not rule out bacterial infection or co-infection with other pathogens not detected by the test. Clinical correlation with patient history and other diagnostic information is necessary to determine patient infection status. The expected result is Negative.  Fact Sheet for  Patients: EntrepreneurPulse.com.au  Fact Sheet for Healthcare Providers: IncredibleEmployment.be  This test is not yet approved or cleared by the [place] FDA and  has been authorized for detection and/or diagnosis of SARS-CoV-2 by FDA under an Emergency Use Authorization (EUA).  This EUA will remain in effect (meaning this test can  be used) for the duration of  the COVID-19 declaration under Section 564(b)(1) of the Act, 21 U.S.C. section 360bbb-3(b)(1), unless the authorization is terminated or revoked sooner.     Influenza A by PCR NEGATIVE NEGATIVE Final   Influenza B by PCR NEGATIVE NEGATIVE Final    Comment: (NOTE) The Xpert Xpress SARS-CoV-2/FLU/RSV plus assay is intended as an aid in the diagnosis of influenza from Nasopharyngeal swab specimens and should not be used as a sole basis for treatment. Nasal washings and aspirates are unacceptable for Xpert Xpress SARS-CoV-2/FLU/RSV testing.  Fact Sheet for Patients: EntrepreneurPulse.com.au  Fact Sheet for Healthcare Providers: IncredibleEmployment.be  This test is not yet approved or cleared by the [place] FDA and has been authorized for detection and/or diagnosis of SARS-CoV-2 by FDA under an Emergency Use Authorization (EUA). This EUA will remain in effect (meaning this test can be used) for the duration of the COVID-19 declaration under Section 564(b)(1) of the Act, 21 U.S.C. section 360bbb-3(b)(1), unless the authorization is terminated or revoked.  Performed at [facility], [address] [place], [place] [address]      Scheduled Meds: . [MAR Hold] enoxaparin (LOVENOX) injection  40 mg Subcutaneous Q24H  . [MAR Hold] insulin aspart  0-15 Units Subcutaneous Q4H  . [MAR Hold] ondansetron (ZOFRAN) IV  4 mg Intravenous Q6H  . [MAR Hold] pantoprazole (PROTONIX) IV  40 mg Intravenous Q12H   Continuous Infusions: . lactated ringers  100 mL/hr at 02/26/2020 1349    Procedures/Studies: CT ABDOMEN PELVIS W CONTRAST  Result Date: 03/06/2020 CLINICAL DATA:  Abdominal pain EXAM: CT ABDOMEN AND PELVIS WITH CONTRAST TECHNIQUE: Multidetector CT imaging of the abdomen and pelvis was performed using the standard protocol following bolus administration of intravenous contrast. CONTRAST:  135mL OMNIPAQUE IOHEXOL 300 MG/ML  SOLN COMPARISON:  April 04, 2019 FINDINGS: Lower chest: The visualized heart size within normal limits. No pericardial fluid/thickening. No hiatal hernia. The visualized portions of the lungs are clear. Hepatobiliary: The liver is normal in density without focal abnormality.The main portal vein is patent. The patient is status post cholecystectomy. No biliary ductal dilation. Pancreas: Unremarkable. No pancreatic ductal dilatation or surrounding inflammatory changes. Spleen: Normal in size without focal abnormality. Adrenals/Urinary Tract: Both adrenal glands appear normal. The kidneys and collecting system appear normal without evidence of urinary tract calculus or hydronephrosis. Bladder is unremarkable. Stomach/Bowel: The stomach and proximal small bowel are unremarkable. There are mildly dilated air and fluid-filled loops of ileum measuring up to 3.2 cm. No clear transition point or focal wall narrowing is noted. No surrounding inflammatory changes are seen. There is been a prior colectomy with an ileocolonic anastomosis within the deep pelvis. Vascular/Lymphatic: There  are no enlarged mesenteric, retroperitoneal, or pelvic lymph nodes. Scattered aortic atherosclerotic calcifications are seen without aneurysmal dilatation. Reproductive: The prostate is unremarkable. Other: No evidence of abdominal wall mass or hernia. Musculoskeletal: No acute or significant osseous findings. IMPRESSION: Mildly dilated fluid and air-filled ileal loops without a clear transition point. This is likely due to a partial small bowel obstruction.  Prior colectomy with anastomosis within the deep pelvis. Aortic Atherosclerosis (ICD10-I70.0). Electronically Signed   By: Prudencio Pair M.D.   On: 03/06/2020 00:46   DG Abd 2 Views  Result Date: 03/07/2020 CLINICAL DATA:  Acute generalized abdominal pain. EXAM: ABDOMEN - 2 VIEW COMPARISON:  April 08, 2019.  March 06, 2020. FINDINGS: Moderately dilated small bowel loops are noted concerning for distal small bowel obstruction. No definite free air is noted. No colonic dilatation is noted. Status post cholecystectomy. No abnormal calcifications are noted. IMPRESSION: Moderately dilated small bowel loops are noted concerning for distal small bowel obstruction. Electronically Signed   By: Marijo Conception M.D.   On: 03/07/2020 09:32    Orson Eva, DO  [facility]  If 7PM-7AM, please contact night-coverage [email or web address] Password [email or web address] 2020-03-24, 3:20 PM   LOS: 2 days

## 2020-03-08 NOTE — Addendum Note (Signed)
Addendum  created 02/21/2020 1514 by Denese Killings, MD   Order list changed, Order sets accessed, Pharmacy for encounter modified

## 2020-03-08 NOTE — Brief Op Note (Addendum)
03/21/2020 - 03/28/2020  2:55 PM  PATIENT:  Calvin Cruz  60 y.o. male  PRE-OPERATIVE DIAGNOSIS:  Small Bowel Obstruction  POST-OPERATIVE DIAGNOSIS:  Small Bowel Obstruction  PROCEDURE:  Procedure(s): ESOPHAGOGASTRODUODENOSCOPY (EGD) WITH PROPOFOL WITH PLACEMENT OF NASO-GASTRIC TUBE (N/A)  SURGEON:  Surgeon(s) and Role:    * Harvel Quale, MD - Primary  Patient underwent EGD under general anesthesia. The ultrathin scope was advanced through the right nostril up to the duodenum without resistance. The visualized esophagus, stomach and duodenum look normal, there was only presence of scant amount of bilious liquid in the gastric chamber. A guidewire was inserted through the scope, the tip was placed in the antrum. The scope was removed and a 16 French nasogastric tube was advanced through the guidewire. The guidewire was subsequently removed. Reinspection of the NG tube showed that it was located in the stomach.  RECOMMENDATIONS: - Keep NPO - Low intermittent suction - AXR in AM - GI service will sign-off, please call us back if you have any more questions.  Maylon Peppers, MD Gastroenterology and Hepatology [facility]

## 2020-03-08 NOTE — Anesthesia Procedure Notes (Signed)
Procedure Name: Intubation Date/Time: 02/24/2020 2:05 PM Performed by: Vista Deck, CRNA Pre-anesthesia Checklist: Patient identified, Patient being monitored, Timeout performed, Emergency Drugs available and Suction available Patient Re-evaluated:Patient Re-evaluated prior to induction Oxygen Delivery Method: Circle System Utilized Preoxygenation: Pre-oxygenation with 100% oxygen Induction Type: IV induction Ventilation: Mask ventilation without difficulty Laryngoscope Size: Mac and 3 Grade View: Grade I Tube type: Oral Tube size: 7.5 mm Number of attempts: 1 Airway Equipment and Method: stylet Placement Confirmation: ETT inserted through vocal cords under direct vision,  positive ETCO2 and breath sounds checked- equal and bilateral Secured at: 23 cm Tube secured with: Tape Dental Injury: Teeth and Oropharynx as per pre-operative assessment

## 2020-03-08 NOTE — Progress Notes (Signed)
Patient has Chapman NG tube. On-call MD has been notified. No distress noted. Will continue to monitor.

## 2020-03-09 ENCOUNTER — Inpatient Hospital Stay (HOSPITAL_COMMUNITY): Payer: Commercial Managed Care - PPO

## 2020-03-09 DIAGNOSIS — U071 COVID-19: Secondary | ICD-10-CM | POA: Diagnosis not present

## 2020-03-09 DIAGNOSIS — K56600 Partial intestinal obstruction, unspecified as to cause: Secondary | ICD-10-CM | POA: Diagnosis not present

## 2020-03-09 DIAGNOSIS — I2510 Atherosclerotic heart disease of native coronary artery without angina pectoris: Secondary | ICD-10-CM | POA: Diagnosis not present

## 2020-03-09 DIAGNOSIS — R140 Abdominal distension (gaseous): Secondary | ICD-10-CM | POA: Diagnosis not present

## 2020-03-09 LAB — CBC WITH DIFFERENTIAL/PLATELET
Abs Immature Granulocytes: 0.01 10*3/uL (ref 0.00–0.07)
Basophils Absolute: 0 10*3/uL (ref 0.0–0.1)
Basophils Relative: 1 %
Eosinophils Absolute: 0 10*3/uL (ref 0.0–0.5)
Eosinophils Relative: 0 %
HCT: 47.6 % (ref 39.0–52.0)
Hemoglobin: 16.5 g/dL (ref 13.0–17.0)
Immature Granulocytes: 0 %
Lymphocytes Relative: 18 %
Lymphs Abs: 0.5 10*3/uL — ABNORMAL LOW (ref 0.7–4.0)
MCH: 31.5 pg (ref 26.0–34.0)
MCHC: 34.7 g/dL (ref 30.0–36.0)
MCV: 90.8 fL (ref 80.0–100.0)
Monocytes Absolute: 0.3 10*3/uL (ref 0.1–1.0)
Monocytes Relative: 10 %
Neutro Abs: 2.1 10*3/uL (ref 1.7–7.7)
Neutrophils Relative %: 71 %
Platelets: 139 10*3/uL — ABNORMAL LOW (ref 150–400)
RBC: 5.24 MIL/uL (ref 4.22–5.81)
RDW: 12.1 % (ref 11.5–15.5)
WBC: 3 10*3/uL — ABNORMAL LOW (ref 4.0–10.5)
nRBC: 0 % (ref 0.0–0.2)

## 2020-03-09 LAB — GLUCOSE, CAPILLARY
Glucose-Capillary: 178 mg/dL — ABNORMAL HIGH (ref 70–99)
Glucose-Capillary: 180 mg/dL — ABNORMAL HIGH (ref 70–99)
Glucose-Capillary: 185 mg/dL — ABNORMAL HIGH (ref 70–99)
Glucose-Capillary: 195 mg/dL — ABNORMAL HIGH (ref 70–99)
Glucose-Capillary: 202 mg/dL — ABNORMAL HIGH (ref 70–99)
Glucose-Capillary: 207 mg/dL — ABNORMAL HIGH (ref 70–99)

## 2020-03-09 LAB — COMPREHENSIVE METABOLIC PANEL
ALT: 19 U/L (ref 0–44)
AST: 23 U/L (ref 15–41)
Albumin: 3.7 g/dL (ref 3.5–5.0)
Alkaline Phosphatase: 37 U/L — ABNORMAL LOW (ref 38–126)
Anion gap: 11 (ref 5–15)
BUN: 14 mg/dL (ref 8–23)
CO2: 31 mmol/L (ref 22–32)
Calcium: 8.9 mg/dL (ref 8.9–10.3)
Chloride: 96 mmol/L — ABNORMAL LOW (ref 98–111)
Creatinine, Ser: 0.75 mg/dL (ref 0.61–1.24)
GFR, Estimated: >60 mL/min (ref >60)
Glucose, Bld: 203 mg/dL — ABNORMAL HIGH (ref 70–99)
Potassium: 3.8 mmol/L (ref 3.5–5.1)
Sodium: 138 mmol/L (ref 135–145)
Total Bilirubin: 0.8 mg/dL (ref 0.3–1.2)
Total Protein: 6.4 g/dL — ABNORMAL LOW (ref 6.5–8.1)

## 2020-03-09 LAB — D-DIMER, QUANTITATIVE: D-Dimer, Quant: 1.52 ug/mL-FEU — ABNORMAL HIGH (ref 0.00–0.50)

## 2020-03-09 LAB — PHOSPHORUS: Phosphorus: 3.1 mg/dL (ref 2.5–4.6)

## 2020-03-09 LAB — FERRITIN: Ferritin: 568 ng/mL — ABNORMAL HIGH (ref 24–336)

## 2020-03-09 LAB — C-REACTIVE PROTEIN: CRP: 3.8 mg/dL — ABNORMAL HIGH (ref <1.0)

## 2020-03-09 LAB — MAGNESIUM: Magnesium: 1.7 mg/dL (ref 1.7–2.4)

## 2020-03-09 MED ORDER — FENTANYL CITRATE (PF) 100 MCG/2ML IJ SOLN
100.0000 ug | INTRAMUSCULAR | Status: DC | PRN
  Administered 2020-03-09 – 2020-03-18 (×36): 100 ug via INTRAVENOUS
  Filled 2020-03-09 (×36): qty 2

## 2020-03-09 MED ORDER — METOCLOPRAMIDE HCL 5 MG/ML IJ SOLN
10.0000 mg | Freq: Three times a day (TID) | INTRAMUSCULAR | Status: DC | PRN
  Administered 2020-03-09 – 2020-03-23 (×5): 10 mg via INTRAVENOUS
  Filled 2020-03-09 (×5): qty 2

## 2020-03-09 MED ORDER — ACETAMINOPHEN 500 MG PO TABS
1000.0000 mg | ORAL_TABLET | Freq: Once | ORAL | Status: AC
  Administered 2020-03-09: 1000 mg via ORAL
  Filled 2020-03-09: qty 2

## 2020-03-09 MED ORDER — ACETAMINOPHEN 10 MG/ML IV SOLN
1000.0000 mg | Freq: Once | INTRAVENOUS | Status: AC
  Administered 2020-03-09: 1000 mg via INTRAVENOUS
  Filled 2020-03-09: qty 100

## 2020-03-09 MED ORDER — ACETAMINOPHEN 325 MG PO TABS
650.0000 mg | ORAL_TABLET | Freq: Four times a day (QID) | ORAL | Status: DC | PRN
  Administered 2020-03-20: 650 mg via ORAL
  Filled 2020-03-09 (×3): qty 2

## 2020-03-09 MED ORDER — SODIUM CHLORIDE 0.9 % IV SOLN: 600.0000 mg | Freq: Once | INTRAVENOUS | Status: DC

## 2020-03-09 MED ORDER — PHENOL 1.4 % MT LIQD
1.0000 | OROMUCOSAL | Status: DC | PRN
  Administered 2020-03-10: 1 via OROMUCOSAL
  Filled 2020-03-09: qty 177

## 2020-03-09 MED ORDER — METOCLOPRAMIDE HCL 5 MG/ML IJ SOLN
10.0000 mg | Freq: Once | INTRAMUSCULAR | Status: AC | PRN
  Administered 2020-03-09: 10 mg via INTRAVENOUS
  Filled 2020-03-09: qty 2

## 2020-03-09 MED ORDER — ACETAMINOPHEN 10 MG/ML IV SOLN
1000.0000 mg | Freq: Once | INTRAVENOUS | Status: AC
  Administered 2020-03-09: 1000 mg via INTRAVENOUS
  Filled 2020-03-09 (×2): qty 100

## 2020-03-09 MED ORDER — HYOSCYAMINE SULFATE 0.5 MG/ML IJ SOLN
0.5000 mg | Freq: Three times a day (TID) | INTRAMUSCULAR | Status: DC | PRN
  Administered 2020-03-13: 0.5 mg via INTRAVENOUS
  Filled 2020-03-09 (×2): qty 1

## 2020-03-09 MED ORDER — IBUPROFEN 600 MG PO TABS
600.0000 mg | ORAL_TABLET | Freq: Once | ORAL | Status: DC
  Filled 2020-03-09: qty 1

## 2020-03-09 NOTE — Progress Notes (Signed)
TRH night shift.  The patient continues to be febrile despite receiving IV acetaminophen earlier in the evening.  The staff has placed a cooling blanket as well.  He still having nausea, emesis and diarrhea.  Acetaminophen 1000 mg IV p.o. ordered to be given 6 hours apart from the last dose.  Tennis Must, MD

## 2020-03-09 NOTE — Progress Notes (Signed)
Pt tolerated 50% of clear liquid diet but states now having abd pain/cramping/fullness in mid & lower abd, rated 8/10. PRN med admin'd per order. Pt states, "I just feel like crap all over." Temp is 101.4 rectal now, HR and resp decreased from earlier. MD [facility] notified and aware on continued fever, awaiting additional orders.

## 2020-03-09 NOTE — Progress Notes (Signed)
Called pt's wife Maudie Mercury and gave her update on pt's condition and plan to move pt to ICU step-down bed for closer monitoring. She stated understanding.

## 2020-03-09 NOTE — Progress Notes (Signed)
1 Day Post-Op  Subjective: Patient complaining of intermittent abdominal pain and the fact that the Internet is out.  He had an NG tube placed by endoscopy yesterday, but it came out yesterday evening.  He is passing gas and having bowel movements.  Objective: Vital signs in last 24 hours: Temp:  [98 F (36.7 C)-99.3 F (37.4 C)] 98 F (36.7 C) (01/19 0500) Pulse Rate:  [80-91] 83 (01/19 0500) Resp:  [16-18] 16 (01/19 0500) BP: (141-155)/(82-94) 143/87 (01/19 0500) SpO2:  [92 %-97 %] 96 % (01/19 0500) Last BM Date: 03/09/20  Intake/Output from previous day: 01/18 0701 - 01/19 0700 In: 800 [I.V.:800] Out: 100 [Emesis/NG output:100] Intake/Output this shift: No intake/output data recorded.  General appearance: alert, cooperative and mild distress GI: Soft, not particularly distended.  Intermittent migratory tenderness noted to palpation.  Lab Results:  Recent Labs    02/27/2020 0436 03/09/20 0558  WBC 4.1 3.0*  HGB 16.4 16.5  HCT 48.3 47.6  PLT 164 139*   BMET Recent Labs    03/16/2020 0436 03/09/20 0558  NA 138 138  K 3.6 3.8  CL 96* 96*  CO2 30 31  GLUCOSE 183* 203*  BUN 17 14  CREATININE 0.66 0.75  CALCIUM 9.1 8.9   PT/INR No results for input(s): LABPROT, INR in the last 72 hours.  Studies/Results: DG Abd 1 View  Result Date: 03/09/2020 CLINICAL DATA:  Abdominal distension EXAM: ABDOMEN - 1 VIEW COMPARISON:  03/07/2020 FINDINGS: Air-filled, dilated loops of small bowel are again identified. Degree of distention is similar but there are increased dilated loops. No definite free air on this supine view. Colon appears to be decompressed. IMPRESSION: Similar degree of small-bowel distention with increased number of dilated loops visualized. Electronically Signed   By: Macy Mis M.D.   On: 03/09/2020 08:08   DG Abd 2 Views  Result Date: 03/07/2020 CLINICAL DATA:  Acute generalized abdominal pain. EXAM: ABDOMEN - 2 VIEW COMPARISON:  April 08, 2019.  March 06, 2020. FINDINGS: Moderately dilated small bowel loops are noted concerning for distal small bowel obstruction. No definite free air is noted. No colonic dilatation is noted. Status post cholecystectomy. No abnormal calcifications are noted. IMPRESSION: Moderately dilated small bowel loops are noted concerning for distal small bowel obstruction. Electronically Signed   By: Marijo Conception M.D.   On: 03/07/2020 09:32    Anti-infectives: Anti-infectives (From admission, onward)   None      Assessment/Plan: s/p Procedure(s): ESOPHAGOGASTRODUODENOSCOPY (EGD) WITH PROPOFOL WITH PLACEMENT OF NASO-GASTRIC TUBE Impression: Patient is having bowel movements.  He does not appear to have a bowel obstruction that would require any surgical intervention.  His whole situation is complicated by his COVID positivity.  Would advance diet once patient's condition improves.  Pain control is an issue with this patient.  He appears to be getting pain medication every 2 hours.  We will follow peripherally with you.  LOS: 3 days    Aviva Signs 03/09/2020

## 2020-03-09 NOTE — Progress Notes (Signed)
Just spoke with pt's wife Maudie Mercury by phone. She states that pt takes no pain medication at home and that each time patient receives narcotics in the hospital it causes some confusion. She states she just talked with patient and he was telling her about someone's truck headlights blinding him in his room. She states that this is not the patient's norm. She also states that the reason she asked for the Dilaudid to be stopped earlier was because the fentanyl seemed to work better for the pain without causing as much issues with his bowels. Her concern now is, since patient is passing stool and gas and no longer has any vomiting, that he may be manageable at home, as she is very familiar with these episodes as his primary caregiver. She wishes to speak with Dr Dyann Kief as well. I thanked wife for the background information and clarification on home pain medication use. This information and her request relayed to Dr. Dyann Kief.

## 2020-03-09 NOTE — Progress Notes (Signed)
At 0830, pt ringing out on call bell, asking for pain medication per Network engineer. In to evaluate pt, asked him what he needed and pt states, "I didn't hit the call bell. I don't know what you are talking about." Call bell light still on when I entered room, showed pt active call on screen. Pt says, "I don't know. I don't remember calling out for nothing, but my back sure is hurting real bad." Reminded pt that he had pain med 1 hour ago and it is not due till 0930 and that it is written on his white board for his viewing. Pt states, "Oh, I forgot."  Obtained blood glucose and administered scheduled insulin coverage. When asked if there was anything else I could do for him at this time, pt states, "I don't know what I need." No hiccupping noted, lying in bed looking out window.

## 2020-03-09 NOTE — Progress Notes (Signed)
1000: Pt up, ambulated to bathroom, had moderate sized liquid brown BM. Pt then ambulated approx 75 ft in room with standby assist. Pt brushed teeth and then up in recliner. Pt c/o feeling "cold", heat in room increased for comfort. Pt's skin feels warm, oral temp 100. HR up to 130 with ambulation, remains 120-125 after 5 minutes of rest. SaO2 90-93% on room air. B/P 131/84.   1019: Pt asked to go back to bed, states, "I don't feel very good at all." HR down to 112/min with lying down. Rectal temp checked and reads 103.7. Pt states that he is having increased abd pain, rubbing abd. Pt then up to bathroom again, had large liquid brown BM x2. MD  [facility] notified via page of current pt condition.  1035: B/P 125/78, HR 113, resp 28/min, SaO2 90% on RA. Pt denies SOB, states abd still, "feels tight".  1045: MD [place] to room to evaluate pt. Orders received.

## 2020-03-09 NOTE — Progress Notes (Signed)
Pt states abd pain eased some after pain med admin'd but now pain is returning and he feels very nauseated. Pt began actively heaving and vomited up light green colored liquid. "Pure bile, that's what it tastes like". Advised pt that MD has ordered one time dose of advil for continued fever (currently 101.6 rectal). Pt states is too nauseated to take anything by mouth, requests med to relieve nausea/vomiting. Phenergan admin'd per order. Advised pt to cut back from drinking liquids to just ice chips at this time, pt states agreement. Once nausea/vomiting subsides will admin advil. MD [facility] notified of current pt condition, VS and tx.

## 2020-03-09 NOTE — Progress Notes (Signed)
Pt resting in bed, b/p 147/91, HR 114, SaO2 91% and resp 22/min. Pt with no SOB. Pt given oral tylenol for fever, diet also change to clear liquids.

## 2020-03-09 NOTE — Progress Notes (Signed)
Pt continues with vomiting and temp. Scheduled zofran given. HR 117, resp 24, temp 101.7 rectally. Abd more distended than earlier today. MD Dyann Kief notified of current vital signs and continued n/v.  Orders received for cooling blanket for 2 hours and IV reglan for refractory vomiting. Notify MD if no improvement.

## 2020-03-09 NOTE — Progress Notes (Signed)
PROGRESS NOTE  Calvin Cruz [id number] DOB: 06/11/1960 DOA: 03/11/2020 PCP: Reynold Bowen, MD  Brief History:  60 year old male with a history ofsecondarypolycythemia(JAK2 neg), hyperlipidemia, diabetes mellitus type 2, hypertension, coronary artery disease status post NSTEMI with DES to the circumflex June 2016, ulcerative colitis status post proctocolectomy with ileoanal anastomosis, and recurrent episodes of pouchitis presenting with upper abdominal pain, nausea, vomiting, and loose stools that began on 03/04/2020. He states that he normally has 5-6 loose bowel movements on average day secondary to his ulcerative colitis. He normally uses Imodium, usually 1 to 2 tablets on a daily basis. His last bowel movement was on 03/14/2020. He denies any fevers, chills, chest pain, shortness of breath, cough, hemoptysis. He denies any headache, sore throat, hemoptysis. He has had numerous episodes of emesis on 03/04/2020 resulting in dry heaving. There is no hematemesis. He denies hematochezia or melena.He has not had a bowel movement since the afternoon of 03/12/2020. In the emergency department, the patient was afebrile hemodynamically stable with oxygen saturation 94-96% on room air. BMP showed a sodium 135, potassium 4.1, BUN 14, serum creatinine 0.81. LFTs were unremarkable. Lipase 21. WBC 7.1, hemoglobin 17.7, platelets 174,000. CT of the abdomen and pelvis showed mildly dilated air-fluid filled loops of ileum up to 3.2 cm. There was no clear transition point or focal narrowing. There is no surrounding inflammatory changes. He is status post colectomy with ileal colonic anastomosis.  Assessment/Plan: Partial small bowel obstruction -continue IVF's and slowly advance diet. -continue judicious use of narcotics; goal is not for pain free. levsin has been added to assist with spasm. -Continue antiemetics around-the-clock -general surgery consult appreciated -he continued  to have intermittent episodes of vomiting; also complaining of abdominal pain.  Orbital distention improved and is currently moving his bowels.  COVID-19 gastroenteritis/Infection -Patient was found to be incidentally COVID-19 positive by RT-PCR -pt's symptoms with minimal pulm symptoms of < 5 days duration with "high risk" criteria--stable on RA -discussed with spouse; currently not requiring steroids or remdesivir. -doubt pouchitis with no inflammatory changes on CT abd; continue holding on the use of antibiotics. -Continue antiemetics as needed. - multiple bowel movements a day; some improvement in abdominal distention and tenderness.  Will advance diet to clear liquids. -continue PPI. -Patient has not been vaccinated -GI and general surgery consult appreciated -CRP--3.3>>2.0 -D-dimer 0.40>>0.64 -ferritin 271>>375 -We will repeat abdominal x-ray in a.m.  Coronary artery disease with history of NSTEMI -No chest pain presently -Restart Brilinta when able to tolerate p.o. -Patient follows with Dr. Glenetta Hew  Diabetes mellitus type 2, controlled -Restart Jardiance once able to tolerate p.o. -Continue sliding scale insulin while inpatient. -03/07/20 A1C--6.7  Hyperlipidemia -Restart Crestor and Vascepa once able to tolerate p.o.  Pituitary Tumor  -Found in 08/28/16 Brain MRI  -Will continue outpatient follow up with Dr. Forde Dandy.   Status is: Inpatient  Remains inpatient appropriate because:IV treatments appropriate due to intensity of illness or inability to take PO   Dispo: The patient is from:Home Anticipated d/c is [id number] Anticipated d/c date is: 3 days Patient currently is not medically stable to d/c.     Family Communication:spouse updated 03/09/20  Consultants:GI, general surgery  Code Status: FULL  DVT Prophylaxis: Donley Lovenox   Procedures: As Listed in Progress Note  Above  Antibiotics: None  Subjective: No chest pain, no shortness of breath, good oxygen saturation on room air.  Multiple bowel movements overnight; some improvement reported of his  abdominal distention.  He ended losing NG tube.  Positive high-grade temperature, sinus tachycardia and 1 episode of vomiting.  Objective: Vitals:   03/09/20 1105 03/09/20 1140 03/09/20 1240 03/09/20 1354  BP: (!) 147/91 128/71 116/72 127/84  Pulse: (!) 114 (!) 116 (!) 112 (!) 109  Resp: (!) 22 (!) 26 (!) 24 (!) 21  Temp:  (!) 103.5 F (39.7 C) (!) 101.4 F (38.6 C) (!) 101.2 F (38.4 C)  TempSrc:  Rectal Rectal Rectal  SpO2: 91% 93% 92% 91%  Weight:      Height:        Intake/Output Summary (Last 24 hours) at 03/09/2020 1537 Last data filed at 03/09/2020 1330 Gross per 24 hour  Intake 720 ml  Output 200 ml  Net 520 ml   Weight change:   Exam: General exam: Alert, awake, cooperative with examination and following commands appropriately.  Still complaining of intermittent abdominal pain, high-grade temperature and one episode of vomiting. NGT got out overnight. Patient has also moved his bowels 4 times. Respiratory system: Not using accessory muscles; good oxygen saturation on room air. No wheezing, no crackles. Cardiovascular system: Sinus tachycardia, no rubs, no gallops, no JVD. Gastrointestinal system: Abdomen is slightly distended, soft and mildly tender to palpation diffusely.  Positive hypoactive bowel sounds.  Central nervous system: Alert and oriented. No focal neurological deficits. Extremities: No cyanosis or clubbing. Skin: No petechiae. Psychiatry: Judgement and insight appear normal. Mood & affect appropriate.   Data Reviewed: I have personally reviewed following labs and imaging studies  Basic Metabolic Panel: Recent Labs  Lab 03/23/20 2240 03/07/20 0430 02/22/2020 0436 03/09/20 0558  NA 135 135 138 138  K 4.1 4.0 3.6 3.8  CL 101 102 96* 96*  CO2 24 24 30 31   GLUCOSE  232* 215* 183* 203*  BUN 14 18 17 14   CREATININE 0.81 0.68 0.66 0.75  CALCIUM 9.7 9.0 9.1 8.9  MG  --  1.8 1.8 1.7  PHOS  --  2.7 2.8 3.1   Liver Function Tests: Recent Labs  Lab 2020/03/23 2240 03/07/20 0430 02/27/2020 0436 03/09/20 0558  AST 26 18 19 23   ALT 29 22 20 19   ALKPHOS 53 46 41 37*  BILITOT 1.4* 1.5* 1.3* 0.8  PROT 8.2* 7.2 6.6 6.4*  ALBUMIN 5.0 4.2 3.9 3.7   Recent Labs  Lab March 23, 2020 2240  LIPASE 21   CBC: Recent Labs  Lab 03-23-2020 2240 03/07/20 0430 02/27/2020 0436 03/09/20 0558  WBC 7.1 4.1 4.1 3.0*  NEUTROABS 5.3 2.4 2.6 2.1  HGB 17.7* 17.4* 16.4 16.5  HCT 50.5 50.3 48.3 47.6  MCV 90.2 90.8 92.0 90.8  PLT 174 176 164 139*   CBG: Recent Labs  Lab 03/15/2020 2032 03/09/20 0019 03/09/20 0523 03/09/20 0837 03/09/20 1352  GLUCAP 171* 180* 178* 195* 202*   HbA1C: Recent Labs    03/07/20 0430  HGBA1C 6.7*   Urine analysis:    Component Value Date/Time   COLORURINE YELLOW 04/04/2019 1623   APPEARANCEUR CLEAR 04/04/2019 1623   LABSPEC 1.038 (H) 04/04/2019 1623   PHURINE 5.0 04/04/2019 1623   GLUCOSEU >=500 (A) 04/04/2019 1623   HGBUR NEGATIVE 04/04/2019 1623   BILIRUBINUR NEGATIVE 04/04/2019 1623   KETONESUR 20 (A) 04/04/2019 1623   PROTEINUR NEGATIVE 04/04/2019 1623   NITRITE NEGATIVE 04/04/2019 1623   LEUKOCYTESUR NEGATIVE 04/04/2019 1623   Sepsis Labs:  Recent Results (from the past 240 hour(s))  Resp Panel by RT-PCR (Flu A&B, Covid) Nasopharyngeal Swab  Status: Abnormal   Collection Time: 03/09/2020 10:28 PM   Specimen: Nasopharyngeal Swab; Nasopharyngeal(NP) swabs in vial transport medium  Result Value Ref Range Status   SARS Coronavirus 2 by RT PCR POSITIVE (A) NEGATIVE Final    Comment: RESULT CALLED TO, READ BACK BY AND VERIFIED WITH: M BRAIN AT 0018 ON 03/06/2020 BY JPM (NOTE) SARS-CoV-2 target nucleic acids are DETECTED.  The SARS-CoV-2 RNA is generally detectable in upper respiratory specimens during the acute phase of  infection. Positive results are indicative of the presence of the identified virus, but do not rule out bacterial infection or co-infection with other pathogens not detected by the test. Clinical correlation with patient history and other diagnostic information is necessary to determine patient infection status. The expected result is Negative.  Fact Sheet for Patients: EntrepreneurPulse.com.au  Fact Sheet for Healthcare Providers: IncredibleEmployment.be  This test is not yet approved or cleared by the [place] FDA and  has been authorized for detection and/or diagnosis of SARS-CoV-2 by FDA under an Emergency Use Authorization (EUA).  This EUA will remain in effect (meaning this test can  be used) for the duration of  the COVID-19 declaration under Section 564(b)(1) of the Act, 21 U.S.C. section 360bbb-3(b)(1), unless the authorization is terminated or revoked sooner.     Influenza A by PCR NEGATIVE NEGATIVE Final   Influenza B by PCR NEGATIVE NEGATIVE Final    Comment: (NOTE) The Xpert Xpress SARS-CoV-2/FLU/RSV plus assay is intended as an aid in the diagnosis of influenza from Nasopharyngeal swab specimens and should not be used as a sole basis for treatment. Nasal washings and aspirates are unacceptable for Xpert Xpress SARS-CoV-2/FLU/RSV testing.  Fact Sheet for Patients: EntrepreneurPulse.com.au  Fact Sheet for Healthcare Providers: IncredibleEmployment.be  This test is not yet approved or cleared by the [place] FDA and has been authorized for detection and/or diagnosis of SARS-CoV-2 by FDA under an Emergency Use Authorization (EUA). This EUA will remain in effect (meaning this test can be used) for the duration of the COVID-19 declaration under Section 564(b)(1) of the Act, 21 U.S.C. section 360bbb-3(b)(1), unless the authorization is terminated or revoked.  Performed at [facility], [address] [place], [place] [address]      Scheduled Meds: . enoxaparin (LOVENOX) injection  40 mg Subcutaneous Q24H  . ibuprofen  600 mg Oral Once  . insulin aspart  0-15 Units Subcutaneous Q4H  . ondansetron (ZOFRAN) IV  4 mg Intravenous Q6H  . pantoprazole (PROTONIX) IV  40 mg Intravenous Q12H   Continuous Infusions: . lactated ringers 100 mL/hr at 03/09/20 0730    Procedures/Studies: DG Abd 1 View  Result Date: 03/09/2020 CLINICAL DATA:  Abdominal distension EXAM: ABDOMEN - 1 VIEW COMPARISON:  03/07/2020 FINDINGS: Air-filled, dilated loops of small bowel are again identified. Degree of distention is similar but there are increased dilated loops. No definite free air on this supine view. Colon appears to be decompressed. IMPRESSION: Similar degree of small-bowel distention with increased number of dilated loops visualized. Electronically Signed   By: Macy Mis M.D.   On: 03/09/2020 08:08   CT ABDOMEN PELVIS W CONTRAST  Result Date: 03/06/2020 CLINICAL DATA:  Abdominal pain EXAM: CT ABDOMEN AND PELVIS WITH CONTRAST TECHNIQUE: Multidetector CT imaging of the abdomen and pelvis was performed using the standard protocol following bolus administration of intravenous contrast. CONTRAST:  162mL OMNIPAQUE IOHEXOL 300 MG/ML  SOLN COMPARISON:  April 04, 2019 FINDINGS: Lower chest: The visualized heart size within normal limits. No  pericardial fluid/thickening. No hiatal hernia. The visualized portions of the lungs are clear. Hepatobiliary: The liver is normal in density without focal abnormality.The main portal vein is patent. The patient is status post cholecystectomy. No biliary ductal dilation. Pancreas: Unremarkable. No pancreatic ductal dilatation or surrounding inflammatory changes. Spleen: Normal in size without focal abnormality. Adrenals/Urinary Tract: Both adrenal glands appear normal. The kidneys and collecting system appear normal without evidence of urinary tract calculus  or hydronephrosis. Bladder is unremarkable. Stomach/Bowel: The stomach and proximal small bowel are unremarkable. There are mildly dilated air and fluid-filled loops of ileum measuring up to 3.2 cm. No clear transition point or focal wall narrowing is noted. No surrounding inflammatory changes are seen. There is been a prior colectomy with an ileocolonic anastomosis within the deep pelvis. Vascular/Lymphatic: There are no enlarged mesenteric, retroperitoneal, or pelvic lymph nodes. Scattered aortic atherosclerotic calcifications are seen without aneurysmal dilatation. Reproductive: The prostate is unremarkable. Other: No evidence of abdominal wall mass or hernia. Musculoskeletal: No acute or significant osseous findings. IMPRESSION: Mildly dilated fluid and air-filled ileal loops without a clear transition point. This is likely due to a partial small bowel obstruction. Prior colectomy with anastomosis within the deep pelvis. Aortic Atherosclerosis (ICD10-I70.0). Electronically Signed   By: Prudencio Pair M.D.   On: 03/06/2020 00:46   DG Abd 2 Views  Result Date: 03/07/2020 CLINICAL DATA:  Acute generalized abdominal pain. EXAM: ABDOMEN - 2 VIEW COMPARISON:  April 08, 2019.  March 06, 2020. FINDINGS: Moderately dilated small bowel loops are noted concerning for distal small bowel obstruction. No definite free air is noted. No colonic dilatation is noted. Status post cholecystectomy. No abnormal calcifications are noted. IMPRESSION: Moderately dilated small bowel loops are noted concerning for distal small bowel obstruction. Electronically Signed   By: Marijo Conception M.D.   On: 03/07/2020 09:32    Barton Dubois, MD  [facility]  If 7PM-7AM, please contact night-coverage [email or web address] Password [email or web address] 03/09/2020, 3:37 PM   LOS: 3 days

## 2020-03-09 NOTE — Progress Notes (Signed)
MD Arnoldo Morale just left pt room s/p evaluation. MD notified that pt has been calling out for pain medication every hour during the night per report of off-going nurse. Pt currently called out for pain medication at 0650 but med not due till now.  In to evaluate pt, pt moaning and groaning, hiccups noted. Pt states, "Thank God it's you! Nobody's done anything for me all night! I've laid here and suffered all night. They always tell me "30 more minutes" when I call but 30 minutes never comes. And the internet is out!" Reminded pt that he has received pain medication every 2 hours during the night and that he is calling out every hour or more for additional pain medication. Also advised pt that the nursing staff has no control over the internet service but that I would put a work order in to have IT evaluate service. Pt states, "That's OK, I don't care about the internet anyway."  Pt pulled his NGT out early in the night shift. Pt states to me, "It just fell out." Advised pt that the tube was securely taped to his nose and was inserted down into his stomach, so it cannot just "fall out", it would require some tension applied to tube to pull tape off and tube out. Pt states, "Well, I guess I stepped on it last night going to the bathroom and pulled it out." Large amount of brown liquid BM noted in toilet. When questioned, pt states he has had 4 liquids BM's during the night. Pt's abd is not distended this am, bowel sounds noted in all four quadrants and is soft to palpation. Pt is asking for pain medication. Pt rates pain 10/10. When asked to identify his area of pain, pt points to his right side. Upon inspection, pt states his pain is in hip right hip bone. Hip area not red, no swelling noted, no increased pain with movement of right leg or with palpation. No c/o abd pain. Pt states, "It's just killing me. I can't stand it! Oh, oh, ow!" Pain med administered per order.   Had frank discussion with patient regarding  his constant requests for pain medication and apparent lack of relief of pain with the med. Pt states he takes Dilaudid at home for "pouchitis" in his colon. When I pressed him to specify how much and how often he is taking the Dilaudid at home, pt is very vague, (I don't really know, when I need it.") and begins to moan and groan again. I asked pt if he felt he had a dependency issue with his pain medication and he states, "Yea, I think it might be a problem. I've been thinking about it, once yall give me the pain medication all I can think about is when I can get the next dose." Advised pt that I would ask his MD to talk with him about the pain medication issue and goals for treatment. Pt agreeable.

## 2020-03-09 NOTE — Progress Notes (Signed)
Cooling blanket ordered and applied with target temp set at 98.5 per MD [facility] verbal order. Pt temp at time of application was 468.0 rectal. Procedure and rationale explained to patient and he is agreeable. IV Reglan given as pt continues to vomit green colored emesis.

## 2020-03-10 ENCOUNTER — Encounter (HOSPITAL_COMMUNITY): Payer: Self-pay | Admitting: Gastroenterology

## 2020-03-10 ENCOUNTER — Inpatient Hospital Stay (HOSPITAL_COMMUNITY): Payer: Commercial Managed Care - PPO

## 2020-03-10 DIAGNOSIS — K56600 Partial intestinal obstruction, unspecified as to cause: Secondary | ICD-10-CM | POA: Diagnosis not present

## 2020-03-10 DIAGNOSIS — R1030 Lower abdominal pain, unspecified: Secondary | ICD-10-CM | POA: Diagnosis not present

## 2020-03-10 DIAGNOSIS — I2510 Atherosclerotic heart disease of native coronary artery without angina pectoris: Secondary | ICD-10-CM | POA: Diagnosis not present

## 2020-03-10 DIAGNOSIS — R140 Abdominal distension (gaseous): Secondary | ICD-10-CM | POA: Diagnosis not present

## 2020-03-10 LAB — CBC WITH DIFFERENTIAL/PLATELET
Abs Immature Granulocytes: 0.04 10*3/uL (ref 0.00–0.07)
Basophils Absolute: 0.1 10*3/uL (ref 0.0–0.1)
Basophils Relative: 2 %
Eosinophils Absolute: 0 10*3/uL (ref 0.0–0.5)
Eosinophils Relative: 0 %
HCT: 49.4 % (ref 39.0–52.0)
Hemoglobin: 17.4 g/dL — ABNORMAL HIGH (ref 13.0–17.0)
Immature Granulocytes: 1 %
Lymphocytes Relative: 17 %
Lymphs Abs: 0.7 10*3/uL (ref 0.7–4.0)
MCH: 31.4 pg (ref 26.0–34.0)
MCHC: 35.2 g/dL (ref 30.0–36.0)
MCV: 89.2 fL (ref 80.0–100.0)
Monocytes Absolute: 0.3 10*3/uL (ref 0.1–1.0)
Monocytes Relative: 7 %
Neutro Abs: 3.3 10*3/uL (ref 1.7–7.7)
Neutrophils Relative %: 73 %
Platelets: 156 10*3/uL (ref 150–400)
RBC: 5.54 MIL/uL (ref 4.22–5.81)
RDW: 12 % (ref 11.5–15.5)
WBC: 4.5 10*3/uL (ref 4.0–10.5)
nRBC: 0 % (ref 0.0–0.2)

## 2020-03-10 LAB — COMPREHENSIVE METABOLIC PANEL
ALT: 24 U/L (ref 0–44)
AST: 35 U/L (ref 15–41)
Albumin: 4.1 g/dL (ref 3.5–5.0)
Alkaline Phosphatase: 41 U/L (ref 38–126)
Anion gap: 13 (ref 5–15)
BUN: 25 mg/dL — ABNORMAL HIGH (ref 8–23)
CO2: 28 mmol/L (ref 22–32)
Calcium: 9.1 mg/dL (ref 8.9–10.3)
Chloride: 93 mmol/L — ABNORMAL LOW (ref 98–111)
Creatinine, Ser: 1.07 mg/dL (ref 0.61–1.24)
GFR, Estimated: >60 mL/min (ref >60)
Glucose, Bld: 205 mg/dL — ABNORMAL HIGH (ref 70–99)
Potassium: 3 mmol/L — ABNORMAL LOW (ref 3.5–5.1)
Sodium: 134 mmol/L — ABNORMAL LOW (ref 135–145)
Total Bilirubin: 0.6 mg/dL (ref 0.3–1.2)
Total Protein: 7.3 g/dL (ref 6.5–8.1)

## 2020-03-10 LAB — D-DIMER, QUANTITATIVE: D-Dimer, Quant: 1.92 ug/mL-FEU — ABNORMAL HIGH (ref 0.00–0.50)

## 2020-03-10 LAB — PHOSPHORUS: Phosphorus: 4.2 mg/dL (ref 2.5–4.6)

## 2020-03-10 LAB — FERRITIN: Ferritin: 1072 ng/mL — ABNORMAL HIGH (ref 24–336)

## 2020-03-10 LAB — GLUCOSE, CAPILLARY
Glucose-Capillary: 216 mg/dL — ABNORMAL HIGH (ref 70–99)
Glucose-Capillary: 222 mg/dL — ABNORMAL HIGH (ref 70–99)
Glucose-Capillary: 205 mg/dL — ABNORMAL HIGH (ref 70–99)
Glucose-Capillary: 228 mg/dL — ABNORMAL HIGH (ref 70–99)
Glucose-Capillary: 201 mg/dL — ABNORMAL HIGH (ref 70–99)

## 2020-03-10 LAB — MAGNESIUM: Magnesium: 1.8 mg/dL (ref 1.7–2.4)

## 2020-03-10 LAB — C-REACTIVE PROTEIN: CRP: 17.2 mg/dL — ABNORMAL HIGH (ref <1.0)

## 2020-03-10 LAB — MRSA PCR SCREENING: MRSA by PCR: NEGATIVE

## 2020-03-10 MED ORDER — INSULIN GLARGINE 100 UNIT/ML ~~LOC~~ SOLN
7.0000 [IU] | Freq: Every day | SUBCUTANEOUS | Status: DC
  Administered 2020-03-10: 7 [IU] via SUBCUTANEOUS
  Filled 2020-03-10 (×4): qty 0.07

## 2020-03-10 MED ORDER — SODIUM CHLORIDE 0.9 % IV SOLN
100.0000 mg | INTRAVENOUS | Status: AC
  Administered 2020-03-10 (×2): 100 mg via INTRAVENOUS
  Filled 2020-03-10 (×2): qty 20

## 2020-03-10 MED ORDER — SODIUM CHLORIDE 0.9 % IV SOLN
100.0000 mg | Freq: Every day | INTRAVENOUS | Status: AC
  Administered 2020-03-11 – 2020-03-14 (×4): 100 mg via INTRAVENOUS
  Filled 2020-03-10 (×4): qty 20

## 2020-03-10 MED ORDER — METOPROLOL TARTRATE 5 MG/5ML IV SOLN
2.5000 mg | Freq: Three times a day (TID) | INTRAVENOUS | Status: DC
  Administered 2020-03-10 – 2020-03-23 (×39): 2.5 mg via INTRAVENOUS
  Filled 2020-03-10 (×40): qty 5

## 2020-03-10 MED ORDER — CHLORHEXIDINE GLUCONATE CLOTH 2 % EX PADS
6.0000 | MEDICATED_PAD | Freq: Every day | CUTANEOUS | Status: DC
  Administered 2020-03-10 – 2020-03-26 (×16): 6 via TOPICAL

## 2020-03-10 MED ORDER — METHYLPREDNISOLONE SODIUM SUCC 125 MG IJ SOLR
60.0000 mg | Freq: Two times a day (BID) | INTRAMUSCULAR | Status: DC
  Administered 2020-03-10 – 2020-03-17 (×16): 60 mg via INTRAVENOUS
  Filled 2020-03-10 (×17): qty 2

## 2020-03-10 MED ORDER — INSULIN ASPART 100 UNIT/ML ~~LOC~~ SOLN
0.0000 [IU] | SUBCUTANEOUS | Status: DC
  Administered 2020-03-10 – 2020-03-11 (×2): 5 [IU] via SUBCUTANEOUS
  Administered 2020-03-11 (×2): 3 [IU] via SUBCUTANEOUS
  Administered 2020-03-11: 5 [IU] via SUBCUTANEOUS
  Administered 2020-03-11: 3 [IU] via SUBCUTANEOUS
  Administered 2020-03-11 – 2020-03-12 (×7): 5 [IU] via SUBCUTANEOUS
  Administered 2020-03-13: 2 [IU] via SUBCUTANEOUS
  Administered 2020-03-13: 3 [IU] via SUBCUTANEOUS
  Administered 2020-03-13 (×4): 5 [IU] via SUBCUTANEOUS
  Administered 2020-03-14: 3 [IU] via SUBCUTANEOUS
  Administered 2020-03-14: 2 [IU] via SUBCUTANEOUS
  Administered 2020-03-14 – 2020-03-15 (×4): 3 [IU] via SUBCUTANEOUS
  Administered 2020-03-15: 2 [IU] via SUBCUTANEOUS
  Administered 2020-03-15 (×2): 3 [IU] via SUBCUTANEOUS
  Administered 2020-03-15: 2 [IU] via SUBCUTANEOUS
  Administered 2020-03-15: 5 [IU] via SUBCUTANEOUS
  Administered 2020-03-16: 3 [IU] via SUBCUTANEOUS
  Administered 2020-03-16: 5 [IU] via SUBCUTANEOUS
  Administered 2020-03-16: 2 [IU] via SUBCUTANEOUS
  Administered 2020-03-16 (×3): 3 [IU] via SUBCUTANEOUS
  Administered 2020-03-17: 5 [IU] via SUBCUTANEOUS
  Administered 2020-03-17 (×3): 2 [IU] via SUBCUTANEOUS
  Administered 2020-03-17: 5 [IU] via SUBCUTANEOUS
  Administered 2020-03-17: 2 [IU] via SUBCUTANEOUS
  Administered 2020-03-18: 8 [IU] via SUBCUTANEOUS
  Administered 2020-03-18 (×3): 3 [IU] via SUBCUTANEOUS
  Administered 2020-03-18: 5 [IU] via SUBCUTANEOUS
  Administered 2020-03-19 (×2): 3 [IU] via SUBCUTANEOUS
  Administered 2020-03-19: 5 [IU] via SUBCUTANEOUS
  Administered 2020-03-19: 3 [IU] via SUBCUTANEOUS
  Administered 2020-03-19: 5 [IU] via SUBCUTANEOUS
  Administered 2020-03-19: 3 [IU] via SUBCUTANEOUS
  Administered 2020-03-20: 2 [IU] via SUBCUTANEOUS
  Administered 2020-03-20: 8 [IU] via SUBCUTANEOUS
  Administered 2020-03-20: 3 [IU] via SUBCUTANEOUS
  Administered 2020-03-20: 5 [IU] via SUBCUTANEOUS
  Administered 2020-03-20: 2 [IU] via SUBCUTANEOUS
  Administered 2020-03-20: 3 [IU] via SUBCUTANEOUS
  Administered 2020-03-20 – 2020-03-21 (×3): 5 [IU] via SUBCUTANEOUS
  Administered 2020-03-21 (×4): 3 [IU] via SUBCUTANEOUS
  Administered 2020-03-22: 5 [IU] via SUBCUTANEOUS
  Administered 2020-03-22 (×4): 3 [IU] via SUBCUTANEOUS
  Administered 2020-03-23: 5 [IU] via SUBCUTANEOUS
  Administered 2020-03-23 (×2): 3 [IU] via SUBCUTANEOUS
  Administered 2020-03-23: 8 [IU] via SUBCUTANEOUS
  Administered 2020-03-23: 11 [IU] via SUBCUTANEOUS
  Administered 2020-03-23: 3 [IU] via SUBCUTANEOUS
  Administered 2020-03-24 (×2): 5 [IU] via SUBCUTANEOUS
  Administered 2020-03-24: 11 [IU] via SUBCUTANEOUS

## 2020-03-10 MED ORDER — SODIUM CHLORIDE 0.9 % IV SOLN: INTRAVENOUS | Status: DC

## 2020-03-10 MED ORDER — POTASSIUM CHLORIDE IN NACL 20-0.9 MEQ/L-% IV SOLN: INTRAVENOUS | Status: DC

## 2020-03-10 NOTE — Progress Notes (Signed)
PROGRESS NOTE  DENVER KIBBEY [id number] DOB: November 17, 1960 DOA: 02/23/2020 PCP: Reynold Bowen, MD  Brief History:  60 year old male with a history ofsecondarypolycythemia(JAK2 neg), hyperlipidemia, diabetes mellitus type 2, hypertension, coronary artery disease status post NSTEMI with DES to the circumflex June 2016, ulcerative colitis status post proctocolectomy with ileoanal anastomosis, and recurrent episodes of pouchitis presenting with upper abdominal pain, nausea, vomiting, and loose stools that began on 03/04/2020. He states that he normally has 5-6 loose bowel movements on average day secondary to his ulcerative colitis. He normally uses Imodium, usually 1 to 2 tablets on a daily basis. His last bowel movement was on 03/04/2020. He denies any fevers, chills, chest pain, shortness of breath, cough, hemoptysis. He denies any headache, sore throat, hemoptysis. He has had numerous episodes of emesis on 03/21/2020 resulting in dry heaving. There is no hematemesis. He denies hematochezia or melena.He has not had a bowel movement since the afternoon of 03/12/2020. In the emergency department, the patient was afebrile hemodynamically stable with oxygen saturation 94-96% on room air. BMP showed a sodium 135, potassium 4.1, BUN 14, serum creatinine 0.81. LFTs were unremarkable. Lipase 21. WBC 7.1, hemoglobin 17.7, platelets 174,000. CT of the abdomen and pelvis showed mildly dilated air-fluid filled loops of ileum up to 3.2 cm. There was no clear transition point or focal narrowing. There is no surrounding inflammatory changes. He is status post colectomy with ileal colonic anastomosis.  Assessment/Plan: Partial small bowel obstruction -continue judicious use of narcotics; goal is not for pain free.  -Continue the use of levsin. -Continue antiemetics around-the-clock -general surgery consult appreciated -he continued to have intermittent episodes of vomiting; also  complaining of abdominal pain.  -Continue to slowly advance diet as tolerated.  Hypokalemia -In the setting of GI losses -We will provide repletion, supplementation clinically maintenance fluids -Follow trend.  COVID-19 gastroenteritis/Infection -Patient was found to be incidentally COVID-19 positive by RT-PCR -pt's symptoms with minimal pulm symptoms of < 5 days duration with "high risk" criteria--stable on RA -With changes in inflammatory markers, fever and also presence of tachypnea will initiate treatment with remdesivir and steroids.  Case has been discussed with wife in details and she is in agreement. -Continue antiemetics as needed; Reglan added for refractory symptoms.. -Hypoactive bowel sounds appreciated on exam; patient has continue experiencing loose BMs. -continue PPI. -Patient has not been vaccinated -GI and general surgery consultation appreciated -CRP--3.3>>2.0>>17 -Continue to follow inflammatory markers. -Changes consistent with dilated loops of SBO on today's x-ray.  Coronary artery disease with history of NSTEMI -No chest pain presently -Restart Brilinta when able to tolerate p.o. -Patient follows with Dr. Glenetta Hew -Beta-blocker has been resumed.  Diabetes mellitus type 2, controlled -Restart Jardiance once able to tolerate p.o. -Continue sliding scale insulin while inpatient. -03/07/20 A1C--6.7 -Treatment for COVID infection using the steroids has been started; CBGs expected to get elevated -Will add low-dose Lantus to assist with management.  Hyperlipidemia -Restart Crestor and Vascepa once able to tolerate p.o.  Pituitary Tumor  -Found in 08/28/16 Brain MRI  -Will continue outpatient follow up with Dr. Forde Dandy.   Status is: Inpatient  Remains inpatient appropriate because:IV treatments appropriate due to intensity of illness or inability to take PO   Dispo: The patient is from:Home Anticipated d/c is  [id number] Anticipated d/c date is: 3 days Patient currently is not medically stable to d/c.     Family Communication:spouse updated 03/10/20  Consultants:GI, general surgery  Code Status:  FULL  DVT Prophylaxis: [place] Lovenox   Procedures: As Listed in Progress Note Above  Antibiotics: None  Subjective: Continues to have intermittent episode of vomiting, loose bowel movements, fever, tachycardia, tachypnea and general malaise.  Tolerating sips of clear liquids only.  Repeat x-ray with positive changes consistent with loop dilation and SBO.  Objective: Vitals:   03/10/20 1600 03/10/20 1633 03/10/20 1700 03/10/20 1800  BP: 107/69  126/75 138/83  Pulse: (!) 110 (!) 103 (!) 106 (!) 104  Resp: (!) 24 (!) 23 20 (!) 26  Temp:  98.9 F (37.2 C)    TempSrc:  Oral    SpO2: 96% 97% 95% 94%  Weight:      Height:        Intake/Output Summary (Last 24 hours) at 03/10/2020 1825 Last data filed at 03/10/2020 1510 Gross per 24 hour  Intake 1054.27 ml  Output --  Net 1054.27 ml   Weight change:   Exam: General exam: Alert, awake, oriented x 3; patient continue experiencing intermittent episodes of fever, vomiting and was also having bowel movements overnight.  Mild tachycardia and tachypnea also appreciated. Respiratory system: Intermittently tachypnea, 2 L oxygen supplementation provided overnight.  No using accessory muscles.  During today's evaluation no oxygen required. Cardiovascular system: Sinus tachycardia appreciated; no rubs, no gallops, no JVD. Gastrointestinal system: Abdomen is distended, soft and mildly diffuse tenderness appreciated on palpation.  Hypoactive bowel sounds appreciated. Central nervous system: Alert and oriented. No focal neurological deficits. Extremities: No cyanosis or clubbing. Skin: No petechiae. Psychiatry: Judgement and insight appear normal. Mood & affect appropriate.   Data Reviewed: I have personally  reviewed following labs and imaging studies  Basic Metabolic Panel: Recent Labs  Lab 03/04/2020 2240 03/07/20 0430 03/19/2020 0436 03/09/20 0558 03/10/20 0326  NA 135 135 138 138 134*  K 4.1 4.0 3.6 3.8 3.0*  CL 101 102 96* 96* 93*  CO2 24 24 30 31 28   GLUCOSE 232* 215* 183* 203* 205*  BUN 14 18 17 14  25*  CREATININE 0.81 0.68 0.66 0.75 1.07  CALCIUM 9.7 9.0 9.1 8.9 9.1  MG  --  1.8 1.8 1.7 1.8  PHOS  --  2.7 2.8 3.1 4.2   Liver Function Tests: Recent Labs  Lab 02/25/2020 2240 03/07/20 0430 03/16/2020 0436 03/09/20 0558 03/10/20 0326  AST 26 18 19 23  35  ALT 29 22 20 19 24   ALKPHOS 53 46 41 37* 41  BILITOT 1.4* 1.5* 1.3* 0.8 0.6  PROT 8.2* 7.2 6.6 6.4* 7.3  ALBUMIN 5.0 4.2 3.9 3.7 4.1   Recent Labs  Lab 03/13/2020 2240  LIPASE 21   CBC: Recent Labs  Lab 03/10/2020 2240 03/07/20 0430 03/12/2020 0436 03/09/20 0558 03/10/20 0326  WBC 7.1 4.1 4.1 3.0* 4.5  NEUTROABS 5.3 2.4 2.6 2.1 3.3  HGB 17.7* 17.4* 16.4 16.5 17.4*  HCT 50.5 50.3 48.3 47.6 49.4  MCV 90.2 90.8 92.0 90.8 89.2  PLT 174 176 164 139* 156   CBG: Recent Labs  Lab 03/09/20 2009 03/10/20 0432 03/10/20 0841 03/10/20 1147 03/10/20 1632  GLUCAP 185* 216* 222* 205* 228*   HbA1C: No results for input(s): HGBA1C in the last 72 hours. Urine analysis:    Component Value Date/Time   COLORURINE YELLOW 04/04/2019 1623   APPEARANCEUR CLEAR 04/04/2019 1623   LABSPEC 1.038 (H) 04/04/2019 1623   PHURINE 5.0 04/04/2019 1623   GLUCOSEU >=500 (A) 04/04/2019 1623   HGBUR NEGATIVE 04/04/2019 1623   BILIRUBINUR NEGATIVE 04/04/2019 1623  KETONESUR 20 (A) 04/04/2019 1623   PROTEINUR NEGATIVE 04/04/2019 1623   NITRITE NEGATIVE 04/04/2019 1623   LEUKOCYTESUR NEGATIVE 04/04/2019 1623   Sepsis Labs:  Recent Results (from the past 240 hour(s))  Resp Panel by RT-PCR (Flu A&B, Covid) Nasopharyngeal Swab     Status: Abnormal   Collection Time: 02/21/2020 10:28 PM   Specimen: Nasopharyngeal Swab; Nasopharyngeal(NP)  swabs in vial transport medium  Result Value Ref Range Status   SARS Coronavirus 2 by RT PCR POSITIVE (A) NEGATIVE Final    Comment: RESULT CALLED TO, READ BACK BY AND VERIFIED WITH: M BRAIN AT 0018 ON 03/06/2020 BY JPM (NOTE) SARS-CoV-2 target nucleic acids are DETECTED.  The SARS-CoV-2 RNA is generally detectable in upper respiratory specimens during the acute phase of infection. Positive results are indicative of the presence of the identified virus, but do not rule out bacterial infection or co-infection with other pathogens not detected by the test. Clinical correlation with patient history and other diagnostic information is necessary to determine patient infection status. The expected result is Negative.  Fact Sheet for Patients: EntrepreneurPulse.com.au  Fact Sheet for Healthcare Providers: IncredibleEmployment.be  This test is not yet approved or cleared by the [place] FDA and  has been authorized for detection and/or diagnosis of SARS-CoV-2 by FDA under an Emergency Use Authorization (EUA).  This EUA will remain in effect (meaning this test can  be used) for the duration of  the COVID-19 declaration under Section 564(b)(1) of the Act, 21 U.S.C. section 360bbb-3(b)(1), unless the authorization is terminated or revoked sooner.     Influenza A by PCR NEGATIVE NEGATIVE Final   Influenza B by PCR NEGATIVE NEGATIVE Final    Comment: (NOTE) The Xpert Xpress SARS-CoV-2/FLU/RSV plus assay is intended as an aid in the diagnosis of influenza from Nasopharyngeal swab specimens and should not be used as a sole basis for treatment. Nasal washings and aspirates are unacceptable for Xpert Xpress SARS-CoV-2/FLU/RSV testing.  Fact Sheet for Patients: EntrepreneurPulse.com.au  Fact Sheet for Healthcare Providers: IncredibleEmployment.be  This test is not yet approved or cleared by the [place] FDA  and has been authorized for detection and/or diagnosis of SARS-CoV-2 by FDA under an Emergency Use Authorization (EUA). This EUA will remain in effect (meaning this test can be used) for the duration of the COVID-19 declaration under Section 564(b)(1) of the Act, 21 U.S.C. section 360bbb-3(b)(1), unless the authorization is terminated or revoked.  Performed at [facility], [address], [place], [place] [address]   MRSA PCR Screening     Status: None   Collection Time: 03/10/20 12:47 AM   Specimen: Nasal Mucosa; Nasopharyngeal  Result Value Ref Range Status   MRSA by PCR NEGATIVE NEGATIVE Final    Comment:        The GeneXpert MRSA Assay (FDA approved for NASAL specimens only), is one component of a comprehensive MRSA colonization surveillance program. It is not intended to diagnose MRSA infection nor to guide or monitor treatment for MRSA infections. Performed at [facility], [address] [place],  [address]      Scheduled Meds: . Chlorhexidine Gluconate Cloth  6 each Topical Daily  . enoxaparin (LOVENOX) injection  40 mg Subcutaneous Q24H  . insulin aspart  0-15 Units Subcutaneous Q4H  . insulin glargine  7 Units Subcutaneous QHS  . methylPREDNISolone (SOLU-MEDROL) injection  60 mg Intravenous Q12H  . metoprolol tartrate  2.5 mg Intravenous Q8H  . ondansetron (ZOFRAN) IV  4 mg Intravenous Q6H  .  pantoprazole (PROTONIX) IV  40 mg Intravenous Q12H   Continuous Infusions: . 0.9 % NaCl with KCl 20 mEq / L 100 mL/hr at 03/10/20 1510  . [START ON 03/11/2020] remdesivir 100 mg in NS 100 mL      Procedures/Studies: DG Abd 1 View  Result Date: 03/09/2020 CLINICAL DATA:  Abdominal distension EXAM: ABDOMEN - 1 VIEW COMPARISON:  03/07/2020 FINDINGS: Air-filled, dilated loops of small bowel are again identified. Degree of distention is similar but there are increased dilated loops. No definite free air on this supine view. Colon appears to be decompressed. IMPRESSION:  Similar degree of small-bowel distention with increased number of dilated loops visualized. Electronically Signed   By: Macy Mis M.D.   On: 03/09/2020 08:08   CT ABDOMEN PELVIS W CONTRAST  Result Date: 03/06/2020 CLINICAL DATA:  Abdominal pain EXAM: CT ABDOMEN AND PELVIS WITH CONTRAST TECHNIQUE: Multidetector CT imaging of the abdomen and pelvis was performed using the standard protocol following bolus administration of intravenous contrast. CONTRAST:  167mL OMNIPAQUE IOHEXOL 300 MG/ML  SOLN COMPARISON:  April 04, 2019 FINDINGS: Lower chest: The visualized heart size within normal limits. No pericardial fluid/thickening. No hiatal hernia. The visualized portions of the lungs are clear. Hepatobiliary: The liver is normal in density without focal abnormality.The main portal vein is patent. The patient is status post cholecystectomy. No biliary ductal dilation. Pancreas: Unremarkable. No pancreatic ductal dilatation or surrounding inflammatory changes. Spleen: Normal in size without focal abnormality. Adrenals/Urinary Tract: Both adrenal glands appear normal. The kidneys and collecting system appear normal without evidence of urinary tract calculus or hydronephrosis. Bladder is unremarkable. Stomach/Bowel: The stomach and proximal small bowel are unremarkable. There are mildly dilated air and fluid-filled loops of ileum measuring up to 3.2 cm. No clear transition point or focal wall narrowing is noted. No surrounding inflammatory changes are seen. There is been a prior colectomy with an ileocolonic anastomosis within the deep pelvis. Vascular/Lymphatic: There are no enlarged mesenteric, retroperitoneal, or pelvic lymph nodes. Scattered aortic atherosclerotic calcifications are seen without aneurysmal dilatation. Reproductive: The prostate is unremarkable. Other: No evidence of abdominal wall mass or hernia. Musculoskeletal: No acute or significant osseous findings. IMPRESSION: Mildly dilated fluid and  air-filled ileal loops without a clear transition point. This is likely due to a partial small bowel obstruction. Prior colectomy with anastomosis within the deep pelvis. Aortic Atherosclerosis (ICD10-I70.0). Electronically Signed   By: Prudencio Pair M.D.   On: 03/06/2020 00:46   DG Abd 2 Views  Result Date: 03/10/2020 CLINICAL DATA:  Small-bowel obstruction. EXAM: ABDOMEN - 2 VIEW COMPARISON:  03/09/2020.  CT 03/06/2020. FINDINGS: Surgical clips right upper quadrant. Persistent prominent small bowel distention noted. Prior colectomy. No free air. Degenerative changes lumbar spine and both hips. IMPRESSION: Persistent prominent small bowel distention. Findings consistent with persistent small bowel obstruction. Prior colectomy. No free air. Electronically Signed   By: Marcello Moores  Register   On: 03/10/2020 05:37   DG Abd 2 Views  Result Date: 03/07/2020 CLINICAL DATA:  Acute generalized abdominal pain. EXAM: ABDOMEN - 2 VIEW COMPARISON:  April 08, 2019.  March 06, 2020. FINDINGS: Moderately dilated small bowel loops are noted concerning for distal small bowel obstruction. No definite free air is noted. No colonic dilatation is noted. Status post cholecystectomy. No abnormal calcifications are noted. IMPRESSION: Moderately dilated small bowel loops are noted concerning for distal small bowel obstruction. Electronically Signed   By: Marijo Conception M.D.   On: 03/07/2020 09:32    Barton Dubois,  MD  [facility]  If 7PM-7AM, please contact night-coverage [email or web address] Password [facility] 03/10/2020, 6:25 PM   LOS: 4 days

## 2020-03-10 NOTE — Progress Notes (Signed)
Inpatient Diabetes Program Recommendations  AACE/ADA: New Consensus Statement on Inpatient Glycemic Control   Target Ranges:  Prepandial:   less than 140 mg/dL      Peak postprandial:   less than 180 mg/dL (1-2 hours)      Critically ill patients:  140 - 180 mg/dL   Results for Calvin Cruz, Calvin Cruz" (MRN [id number]) as of 03/10/2020 11:59  Ref. Range 03/09/2020 08:37 03/09/2020 13:52 03/09/2020 17:38 03/09/2020 20:09 03/10/2020 04:32 03/10/2020 08:41 03/10/2020 11:47  Glucose-Capillary Latest Ref Range: 70 - 99 mg/dL 195 (H) 202 (H) 207 (H) 185 (H) 216 (H) 222 (H) 205 (H)   Review of Glycemic Control  Diabetes history: DM2 Outpatient Diabetes medications: Jardiance 10 mg QAM Current orders for Inpatient glycemic control: Novolog 0-15 units Q4H; Solumedrol 60 mg Q12H  Inpatient Diabetes Program Recommendations:    Insulin: If steroids are continued, please consider ordering Lantus 10 units Q24H.  Thanks, Barnie Alderman, RN, MSN, CDE Diabetes Coordinator Inpatient Diabetes Program [phone] (Team Pager from 8am to 5pm)

## 2020-03-10 NOTE — Progress Notes (Signed)
Called patient's wife and gave her the updates that he is transferring to ICU. She verbalized understanding. Patient was transferred without any incident.

## 2020-03-11 DIAGNOSIS — R1030 Lower abdominal pain, unspecified: Secondary | ICD-10-CM | POA: Diagnosis not present

## 2020-03-11 DIAGNOSIS — K56600 Partial intestinal obstruction, unspecified as to cause: Secondary | ICD-10-CM | POA: Diagnosis not present

## 2020-03-11 DIAGNOSIS — R140 Abdominal distension (gaseous): Secondary | ICD-10-CM | POA: Diagnosis not present

## 2020-03-11 DIAGNOSIS — I2510 Atherosclerotic heart disease of native coronary artery without angina pectoris: Secondary | ICD-10-CM | POA: Diagnosis not present

## 2020-03-11 LAB — CBC WITH DIFFERENTIAL/PLATELET
Band Neutrophils: 2 %
Basophils Absolute: 0 10*3/uL (ref 0.0–0.1)
Basophils Relative: 0 %
Eosinophils Absolute: 0 10*3/uL (ref 0.0–0.5)
Eosinophils Relative: 0 %
HCT: 48.6 % (ref 39.0–52.0)
Hemoglobin: 17 g/dL (ref 13.0–17.0)
Lymphocytes Relative: 20 %
Lymphs Abs: 1.4 10*3/uL (ref 0.7–4.0)
MCH: 31.7 pg (ref 26.0–34.0)
MCHC: 35 g/dL (ref 30.0–36.0)
MCV: 90.5 fL (ref 80.0–100.0)
Monocytes Absolute: 0.2 10*3/uL (ref 0.1–1.0)
Monocytes Relative: 3 %
Neutro Abs: 5.3 10*3/uL (ref 1.7–7.7)
Neutrophils Relative %: 75 %
Platelets: 151 10*3/uL (ref 150–400)
RBC: 5.37 MIL/uL (ref 4.22–5.81)
RDW: 12.3 % (ref 11.5–15.5)
WBC: 6.9 10*3/uL (ref 4.0–10.5)
nRBC: 0 % (ref 0.0–0.2)

## 2020-03-11 LAB — GLUCOSE, CAPILLARY
Glucose-Capillary: 166 mg/dL — ABNORMAL HIGH (ref 70–99)
Glucose-Capillary: 209 mg/dL — ABNORMAL HIGH (ref 70–99)
Glucose-Capillary: 221 mg/dL — ABNORMAL HIGH (ref 70–99)
Glucose-Capillary: 213 mg/dL — ABNORMAL HIGH (ref 70–99)
Glucose-Capillary: 190 mg/dL — ABNORMAL HIGH (ref 70–99)

## 2020-03-11 LAB — COMPREHENSIVE METABOLIC PANEL
ALT: 25 U/L (ref 0–44)
AST: 31 U/L (ref 15–41)
Albumin: 3.6 g/dL (ref 3.5–5.0)
Alkaline Phosphatase: 38 U/L (ref 38–126)
Anion gap: 13 (ref 5–15)
BUN: 27 mg/dL — ABNORMAL HIGH (ref 8–23)
CO2: 29 mmol/L (ref 22–32)
Calcium: 8.9 mg/dL (ref 8.9–10.3)
Chloride: 96 mmol/L — ABNORMAL LOW (ref 98–111)
Creatinine, Ser: 0.87 mg/dL (ref 0.61–1.24)
GFR, Estimated: >60 mL/min (ref >60)
Glucose, Bld: 218 mg/dL — ABNORMAL HIGH (ref 70–99)
Potassium: 3.6 mmol/L (ref 3.5–5.1)
Sodium: 138 mmol/L (ref 135–145)
Total Bilirubin: 0.6 mg/dL (ref 0.3–1.2)
Total Protein: 7 g/dL (ref 6.5–8.1)

## 2020-03-11 LAB — D-DIMER, QUANTITATIVE: D-Dimer, Quant: 1.18 ug/mL-FEU — ABNORMAL HIGH (ref 0.00–0.50)

## 2020-03-11 LAB — MAGNESIUM: Magnesium: 2.1 mg/dL (ref 1.7–2.4)

## 2020-03-11 LAB — FERRITIN: Ferritin: 1412 ng/mL — ABNORMAL HIGH (ref 24–336)

## 2020-03-11 LAB — PHOSPHORUS: Phosphorus: 2.5 mg/dL (ref 2.5–4.6)

## 2020-03-11 LAB — C-REACTIVE PROTEIN: CRP: 15.1 mg/dL — ABNORMAL HIGH (ref <1.0)

## 2020-03-11 MED ORDER — INSULIN GLARGINE 100 UNIT/ML ~~LOC~~ SOLN
12.0000 [IU] | Freq: Every day | SUBCUTANEOUS | Status: DC
  Administered 2020-03-11 – 2020-03-23 (×13): 12 [IU] via SUBCUTANEOUS
  Filled 2020-03-11 (×14): qty 0.12

## 2020-03-11 MED ORDER — IPRATROPIUM-ALBUTEROL 20-100 MCG/ACT IN AERS
1.0000 | INHALATION_SPRAY | Freq: Three times a day (TID) | RESPIRATORY_TRACT | Status: DC
  Administered 2020-03-11 – 2020-03-26 (×41): 1 via RESPIRATORY_TRACT
  Filled 2020-03-11: qty 4

## 2020-03-11 NOTE — Progress Notes (Signed)
Inpatient Diabetes Program Recommendations  AACE/ADA: New Consensus Statement on Inpatient Glycemic Control   Target Ranges:  Prepandial:   less than 140 mg/dL      Peak postprandial:   less than 180 mg/dL (1-2 hours)      Critically ill patients:  140 - 180 mg/dL   Results for CELESTINE, BOUGIE" (MRN [id number]) as of 03/11/2020 09:55  Ref. Range 03/10/2020 08:41 03/10/2020 11:47 03/10/2020 16:32 03/10/2020 20:28 03/11/2020 01:02 03/11/2020 04:30 03/11/2020 07:49  Glucose-Capillary Latest Ref Range: 70 - 99 mg/dL 222 (H) 205 (H) 228 (H) 201 (H) 166 (H) 209 (H) 221 (H)   Review of Glycemic Control  Diabetes history: DM2 Outpatient Diabetes medications: Jardiance 10 mg QAM Current orders for Inpatient glycemic control: Lantus 7 units QHS, Novolog 0-15 units Q4H; Solumedrol 60 mg Q12H  Inpatient Diabetes Program Recommendations:    Insulin: If steroids are continued, please consider increasing Lantus to 15 units QHS.  Thanks, Barnie Alderman, RN, MSN, CDE Diabetes Coordinator Inpatient Diabetes Program [phone] (Team Pager from 8am to 5pm)

## 2020-03-11 NOTE — Progress Notes (Signed)
PROGRESS NOTE  Calvin Cruz [id number] DOB: 1961-02-17 DOA: 03/16/2020 PCP: Reynold Bowen, MD  Brief History:  60 year old male with a history ofsecondarypolycythemia(JAK2 neg), hyperlipidemia, diabetes mellitus type 2, hypertension, coronary artery disease status post NSTEMI with DES to the circumflex June 2016, ulcerative colitis status post proctocolectomy with ileoanal anastomosis, and recurrent episodes of pouchitis presenting with upper abdominal pain, nausea, vomiting, and loose stools that began on 03/04/2020. He states that he normally has 5-6 loose bowel movements on average day secondary to his ulcerative colitis. He normally uses Imodium, usually 1 to 2 tablets on a daily basis. His last bowel movement was on 03/01/2020. He denies any fevers, chills, chest pain, shortness of breath, cough, hemoptysis. He denies any headache, sore throat, hemoptysis. He has had numerous episodes of emesis on 02/22/2020 resulting in dry heaving. There is no hematemesis. He denies hematochezia or melena.He has not had a bowel movement since the afternoon of 03/12/2020. In the emergency department, the patient was afebrile hemodynamically stable with oxygen saturation 94-96% on room air. BMP showed a sodium 135, potassium 4.1, BUN 14, serum creatinine 0.81. LFTs were unremarkable. Lipase 21. WBC 7.1, hemoglobin 17.7, platelets 174,000. CT of the abdomen and pelvis showed mildly dilated air-fluid filled loops of ileum up to 3.2 cm. There was no clear transition point or focal narrowing. There is no surrounding inflammatory changes. He is status post colectomy with ileal colonic anastomosis.  Assessment/Plan: Partial small bowel obstruction -continue judicious use of narcotics; goal is not for pain free.  -Continue the use of levsin. -Continue antiemetics around-the-clock -general surgery consult appreciated -Case discussed with gastroenterology service, no further  recommendation at this time.  They will remain available as needed. -he continued to have intermittent episodes of vomiting; also complaining of abdominal pain.  -Continue to slowly advance diet as tolerated.  Hypokalemia -In the setting of GI losses -Continue to follow electrolytes trend and further replete as needed -Receiving maintenance fluids with potassium at this time. -K 3.6  COVID-19 gastroenteritis/Infection -Patient was found to be incidentally COVID-19 positive by RT-PCR -pt's symptoms with minimal pulm symptoms of < 5 days duration with "high risk" criteria-currently using 2 L nasal cannula supplementation intermittently. -With changes in inflammatory markers, fever and also presence of tachypnea will initiated on steroids and remdesivir.  Day 2 out of 5. -We will continue antiemetics as needed; Reglan added for refractory symptoms if required.. -Hypoactive bowel sounds appreciated on exam; patient has continue experiencing loose BMs. -Stable electrolytes appreciated today. -continue PPI. -Patient has not been vaccinated -GI and general surgery consultation appreciated -CRP--3.3>>2.0>>17>>15 -Continue to follow inflammatory markers and close monitoring of patient renal function and LFTs.. -Most recent abdominal x-ray with likely-patient clinical improvement.  Coronary artery disease with history of NSTEMI -No chest pain presently -Restart Brilinta when able to tolerate p.o. -Patient follows with Dr. Glenetta Hew -Beta-blocker has been resumed.  Diabetes mellitus type 2, controlled -Restart Jardiance once able to tolerate p.o. -Continue sliding scale insulin while inpatient. -03/07/20 A1C--6.7 -Treatment for COVID infection using the steroids has been started; CBGs expected to get elevated -Will add low-dose Lantus to assist with management.  Hyperlipidemia -Restart Crestor and Vascepa once able to tolerate p.o.  Pituitary Tumor  -Found in 08/28/16 Brain MRI   -Will continue outpatient follow up with Dr. Forde Dandy.   Status is: Inpatient  Remains inpatient appropriate because: IV treatments appropriate due to intensity of illness or inability to take PO  Dispo: The patient is from:Home Anticipated d/c is [id number] Anticipated d/c date is: 3 days Patient currently is not medically stable to d/c.     Family Communication:spouse updated 03/10/20  Consultants:GI, general surgery  Code Status: FULL  DVT Prophylaxis: [place] Lovenox   Procedures: As Listed in Progress Note Above  Antibiotics: None  Subjective: No fever, expressing today no nausea or vomiting episode.  Continues to move his bowels.  Using 2 L nasal cannula supplementation intermittently.  Slight short winded with activity.  Speaking in full sentences.  Objective: Vitals:   03/11/20 0300 03/11/20 0400 03/11/20 0751 03/11/20 1146  BP: 104/74 110/76    Pulse: (!) 101 96    Resp: (!) 22 20    Temp:  97.9 F (36.6 C) 97.8 F (36.6 C) 97.7 F (36.5 C)  TempSrc:  Oral Oral Oral  SpO2: 94% 95%    Weight:      Height:        Intake/Output Summary (Last 24 hours) at 03/11/2020 1547 Last data filed at 03/11/2020 0913 Gross per 24 hour  Intake 222 ml  Output --  Net 222 ml   Weight change:   Exam: General exam: Alert, awake, oriented x 3; afebrile currently, reports some nausea overnight but no nausea medication required throughout the day.  No further episode of vomiting.  Continues to have bowel movement.  Abdominal pain and distention significantly improved.  Using 2 L nasal cannula supplementation at this time but with overall improvement in his respiratory rate and symptoms. Respiratory system: Scattered rhonchi, no wheezing, no crackles, no using accessory muscles. Cardiovascular system: Mild sinus tachycardia, no rubs, no gallops, no JVD. Gastrointestinal system: Abdomen is mildly distended, soft, no  guarding, expressed no significant discomfort with deep palpation.  Positive hypoactive bowel sounds appreciated diffusely.  Having bowel movements. Central nervous system: Alert and oriented. No focal neurological deficits. Extremities: No cyanosis or clubbing. Skin: No rashes, no petechiae. Psychiatry: Judgement and insight appear normal. Mood & affect appropriate.    Data Reviewed: I have personally reviewed following labs and imaging studies  Basic Metabolic Panel: Recent Labs  Lab 03/07/20 0430 02/29/2020 0436 03/09/20 0558 03/10/20 0326 03/11/20 0443  NA 135 138 138 134* 138  K 4.0 3.6 3.8 3.0* 3.6  CL 102 96* 96* 93* 96*  CO2 24 30 31 28 29   GLUCOSE 215* 183* 203* 205* 218*  BUN 18 17 14  25* 27*  CREATININE 0.68 0.66 0.75 1.07 0.87  CALCIUM 9.0 9.1 8.9 9.1 8.9  MG 1.8 1.8 1.7 1.8 2.1  PHOS 2.7 2.8 3.1 4.2 2.5   Liver Function Tests: Recent Labs  Lab 03/07/20 0430 02/26/2020 0436 03/09/20 0558 03/10/20 0326 03/11/20 0443  AST 18 19 23  35 31  ALT 22 20 19 24 25   ALKPHOS 46 41 37* 41 38  BILITOT 1.5* 1.3* 0.8 0.6 0.6  PROT 7.2 6.6 6.4* 7.3 7.0  ALBUMIN 4.2 3.9 3.7 4.1 3.6   Recent Labs  Lab 03/16/2020 2240  LIPASE 21   CBC: Recent Labs  Lab 03/07/20 0430 03/16/2020 0436 03/09/20 0558 03/10/20 0326 03/11/20 0443  WBC 4.1 4.1 3.0* 4.5 6.9  NEUTROABS 2.4 2.6 2.1 3.3 5.3  HGB 17.4* 16.4 16.5 17.4* 17.0  HCT 50.3 48.3 47.6 49.4 48.6  MCV 90.8 92.0 90.8 89.2 90.5  PLT 176 164 139* 156 151   CBG: Recent Labs  Lab 03/10/20 2028 03/11/20 0102 03/11/20 0430 03/11/20 0749 03/11/20 1144  GLUCAP 201* 166* 209* 221*  213*   HbA1C: No results for input(s): HGBA1C in the last 72 hours.   Urine analysis:    Component Value Date/Time   COLORURINE YELLOW 04/04/2019 1623   APPEARANCEUR CLEAR 04/04/2019 1623   LABSPEC 1.038 (H) 04/04/2019 1623   PHURINE 5.0 04/04/2019 1623   GLUCOSEU >=500 (A) 04/04/2019 1623   HGBUR NEGATIVE 04/04/2019 1623   BILIRUBINUR  NEGATIVE 04/04/2019 1623   KETONESUR 20 (A) 04/04/2019 1623   PROTEINUR NEGATIVE 04/04/2019 1623   NITRITE NEGATIVE 04/04/2019 1623   LEUKOCYTESUR NEGATIVE 04/04/2019 1623   Sepsis Labs:  Recent Results (from the past 240 hour(s))  Resp Panel by RT-PCR (Flu A&B, Covid) Nasopharyngeal Swab     Status: Abnormal   Collection Time: 03/15/2020 10:28 PM   Specimen: Nasopharyngeal Swab; Nasopharyngeal(NP) swabs in vial transport medium  Result Value Ref Range Status   SARS Coronavirus 2 by RT PCR POSITIVE (A) NEGATIVE Final    Comment: RESULT CALLED TO, READ BACK BY AND VERIFIED WITH: M BRAIN AT 0018 ON 03/06/2020 BY JPM (NOTE) SARS-CoV-2 target nucleic acids are DETECTED.  The SARS-CoV-2 RNA is generally detectable in upper respiratory specimens during the acute phase of infection. Positive results are indicative of the presence of the identified virus, but do not rule out bacterial infection or co-infection with other pathogens not detected by the test. Clinical correlation with patient history and other diagnostic information is necessary to determine patient infection status. The expected result is Negative.  Fact Sheet for Patients: EntrepreneurPulse.com.au  Fact Sheet for Healthcare Providers: IncredibleEmployment.be  This test is not yet approved or cleared by the [place] FDA and  has been authorized for detection and/or diagnosis of SARS-CoV-2 by FDA under an Emergency Use Authorization (EUA).  This EUA will remain in effect (meaning this test can  be used) for the duration of  the COVID-19 declaration under Section 564(b)(1) of the Act, 21 U.S.C. section 360bbb-3(b)(1), unless the authorization is terminated or revoked sooner.     Influenza A by PCR NEGATIVE NEGATIVE Final   Influenza B by PCR NEGATIVE NEGATIVE Final    Comment: (NOTE) The Xpert Xpress SARS-CoV-2/FLU/RSV plus assay is intended as an aid in the diagnosis of  influenza from Nasopharyngeal swab specimens and should not be used as a sole basis for treatment. Nasal washings and aspirates are unacceptable for Xpert Xpress SARS-CoV-2/FLU/RSV testing.  Fact Sheet for Patients: EntrepreneurPulse.com.au  Fact Sheet for Healthcare Providers: IncredibleEmployment.be  This test is not yet approved or cleared by the [place] FDA and has been authorized for detection and/or diagnosis of SARS-CoV-2 by FDA under an Emergency Use Authorization (EUA). This EUA will remain in effect (meaning this test can be used) for the duration of the COVID-19 declaration under Section 564(b)(1) of the Act, 21 U.S.C. section 360bbb-3(b)(1), unless the authorization is terminated or revoked.  Performed at [facility], [address], [place], [place] [address]   MRSA PCR Screening     Status: None   Collection Time: 03/10/20 12:47 AM   Specimen: Nasal Mucosa; Nasopharyngeal  Result Value Ref Range Status   MRSA by PCR NEGATIVE NEGATIVE Final    Comment:        The GeneXpert MRSA Assay (FDA approved for NASAL specimens only), is one component of a comprehensive MRSA colonization surveillance program. It is not intended to diagnose MRSA infection nor to guide or monitor treatment for MRSA infections. Performed at [facility], [address] [place], [place] [address]  Scheduled Meds:  Chlorhexidine Gluconate Cloth  6 each Topical Daily   enoxaparin (LOVENOX) injection  40 mg Subcutaneous Q24H   insulin aspart  0-15 Units Subcutaneous Q4H   insulin glargine  12 Units Subcutaneous QHS   methylPREDNISolone (SOLU-MEDROL) injection  60 mg Intravenous Q12H   metoprolol tartrate  2.5 mg Intravenous Q8H   ondansetron (ZOFRAN) IV  4 mg Intravenous Q6H   pantoprazole (PROTONIX) IV  40 mg Intravenous Q12H   Continuous Infusions:  0.9 % NaCl with KCl 20 mEq / L 100 mL/hr at 03/11/20 [id number]   remdesivir 100 mg in  NS 100 mL 100 mg (03/11/20 1052)    Procedures/Studies: DG Abd 1 View  Result Date: 03/09/2020 CLINICAL DATA:  Abdominal distension EXAM: ABDOMEN - 1 VIEW COMPARISON:  03/07/2020 FINDINGS: Air-filled, dilated loops of small bowel are again identified. Degree of distention is similar but there are increased dilated loops. No definite free air on this supine view. Colon appears to be decompressed. IMPRESSION: Similar degree of small-bowel distention with increased number of dilated loops visualized. Electronically Signed   By: Macy Mis M.D.   On: 03/09/2020 08:08   CT ABDOMEN PELVIS W CONTRAST  Result Date: 03/06/2020 CLINICAL DATA:  Abdominal pain EXAM: CT ABDOMEN AND PELVIS WITH CONTRAST TECHNIQUE: Multidetector CT imaging of the abdomen and pelvis was performed using the standard protocol following bolus administration of intravenous contrast. CONTRAST:  168mL OMNIPAQUE IOHEXOL 300 MG/ML  SOLN COMPARISON:  April 04, 2019 FINDINGS: Lower chest: The visualized heart size within normal limits. No pericardial fluid/thickening. No hiatal hernia. The visualized portions of the lungs are clear. Hepatobiliary: The liver is normal in density without focal abnormality.The main portal vein is patent. The patient is status post cholecystectomy. No biliary ductal dilation. Pancreas: Unremarkable. No pancreatic ductal dilatation or surrounding inflammatory changes. Spleen: Normal in size without focal abnormality. Adrenals/Urinary Tract: Both adrenal glands appear normal. The kidneys and collecting system appear normal without evidence of urinary tract calculus or hydronephrosis. Bladder is unremarkable. Stomach/Bowel: The stomach and proximal small bowel are unremarkable. There are mildly dilated air and fluid-filled loops of ileum measuring up to 3.2 cm. No clear transition point or focal wall narrowing is noted. No surrounding inflammatory changes are seen. There is been a prior colectomy with an  ileocolonic anastomosis within the deep pelvis. Vascular/Lymphatic: There are no enlarged mesenteric, retroperitoneal, or pelvic lymph nodes. Scattered aortic atherosclerotic calcifications are seen without aneurysmal dilatation. Reproductive: The prostate is unremarkable. Other: No evidence of abdominal wall mass or hernia. Musculoskeletal: No acute or significant osseous findings. IMPRESSION: Mildly dilated fluid and air-filled ileal loops without a clear transition point. This is likely due to a partial small bowel obstruction. Prior colectomy with anastomosis within the deep pelvis. Aortic Atherosclerosis (ICD10-I70.0). Electronically Signed   By: Prudencio Pair M.D.   On: 03/06/2020 00:46   DG Abd 2 Views  Result Date: 03/10/2020 CLINICAL DATA:  Small-bowel obstruction. EXAM: ABDOMEN - 2 VIEW COMPARISON:  03/09/2020.  CT 03/06/2020. FINDINGS: Surgical clips right upper quadrant. Persistent prominent small bowel distention noted. Prior colectomy. No free air. Degenerative changes lumbar spine and both hips. IMPRESSION: Persistent prominent small bowel distention. Findings consistent with persistent small bowel obstruction. Prior colectomy. No free air. Electronically Signed   By: Marcello Moores  Register   On: 03/10/2020 05:37   DG Abd 2 Views  Result Date: 03/07/2020 CLINICAL DATA:  Acute generalized abdominal pain. EXAM: ABDOMEN - 2 VIEW COMPARISON:  April 08, 2019.  March 06, 2020. FINDINGS: Moderately dilated small bowel loops are noted concerning for distal small bowel obstruction. No definite free air is noted. No colonic dilatation is noted. Status post cholecystectomy. No abnormal calcifications are noted. IMPRESSION: Moderately dilated small bowel loops are noted concerning for distal small bowel obstruction. Electronically Signed   By: Marijo Conception M.D.   On: 03/07/2020 09:32    Barton Dubois, MD  [facility]  If 7PM-7AM, please contact night-coverage [email or web address] Password  [facility] 03/11/2020, 3:47 PM   LOS: 5 days

## 2020-03-12 DIAGNOSIS — K56600 Partial intestinal obstruction, unspecified as to cause: Secondary | ICD-10-CM | POA: Diagnosis not present

## 2020-03-12 DIAGNOSIS — R140 Abdominal distension (gaseous): Secondary | ICD-10-CM | POA: Diagnosis not present

## 2020-03-12 DIAGNOSIS — U071 COVID-19: Secondary | ICD-10-CM | POA: Diagnosis not present

## 2020-03-12 DIAGNOSIS — I2510 Atherosclerotic heart disease of native coronary artery without angina pectoris: Secondary | ICD-10-CM | POA: Diagnosis not present

## 2020-03-12 LAB — GLUCOSE, CAPILLARY
Glucose-Capillary: 207 mg/dL — ABNORMAL HIGH (ref 70–99)
Glucose-Capillary: 216 mg/dL — ABNORMAL HIGH (ref 70–99)
Glucose-Capillary: 220 mg/dL — ABNORMAL HIGH (ref 70–99)
Glucose-Capillary: 227 mg/dL — ABNORMAL HIGH (ref 70–99)
Glucose-Capillary: 227 mg/dL — ABNORMAL HIGH (ref 70–99)
Glucose-Capillary: 230 mg/dL — ABNORMAL HIGH (ref 70–99)
Glucose-Capillary: 232 mg/dL — ABNORMAL HIGH (ref 70–99)

## 2020-03-12 NOTE — Progress Notes (Signed)
Started patient on Incentive and combivent.Inhaler

## 2020-03-12 NOTE — Progress Notes (Signed)
PROGRESS NOTE  FED CECI DEY:[id number] DOB: 1960/07/01 DOA: 04/01/20 PCP: Reynold Bowen, MD  Brief History:  60 year old male with a history ofsecondarypolycythemia(JAK2 neg), hyperlipidemia, diabetes mellitus type 2, hypertension, coronary artery disease status post NSTEMI with DES to the circumflex June 2016, ulcerative colitis status post proctocolectomy with ileoanal anastomosis, and recurrent episodes of pouchitis presenting with upper abdominal pain, nausea, vomiting, and loose stools that began on 03/04/2020. He states that he normally has 5-6 loose bowel movements on average day secondary to his ulcerative colitis. He normally uses Imodium, usually 1 to 2 tablets on a daily basis. His last bowel movement was on Apr 01, 2020. He denies any fevers, chills, chest pain, shortness of breath, cough, hemoptysis. He denies any headache, sore throat, hemoptysis. He has had numerous episodes of emesis on Apr 01, 2020 resulting in dry heaving. There is no hematemesis. He denies hematochezia or melena.He has not had a bowel movement since the afternoon of 03/12/2020. In the emergency department, the patient was afebrile hemodynamically stable with oxygen saturation 94-96% on room air. BMP showed a sodium 135, potassium 4.1, BUN 14, serum creatinine 0.81. LFTs were unremarkable. Lipase 21. WBC 7.1, hemoglobin 17.7, platelets 174,000. CT of the abdomen and pelvis showed mildly dilated air-fluid filled loops of ileum up to 3.2 cm. There was no clear transition point or focal narrowing. There is no surrounding inflammatory changes. He is status post colectomy with ileal colonic anastomosis.  Assessment/Plan: Partial small bowel obstruction -continue judicious use of narcotics; goal is not for pain free; but to assist tolerating symptoms.   -Continue the use of levsin as needed . -Continue PRN antiemetics.  -general surgery and GI consultation/guidance appreciated -Continue  advancing diet as tolerated.  -Soft/nondistended or tender abdomen, continue to have bowel movements; no vomiting.  Hypokalemia -In the setting of GI losses -Continue to follow electrolytes trend and further replete as needed -Receiving maintenance fluids with potassium at this time.  COVID-19 gastroenteritis/Infection -Patient was found to be incidentally COVID-19 positive by RT-PCR -pt's symptoms with minimal pulm symptoms of < 5 days duration with "high risk" criteria-currently using 2-3 L nasal cannula supplementation intermittently. -With changes in inflammatory markers, fever and also presence of tachypnea will initiated on steroids and remdesivir.  Day 3 out of 5. -We will continue antiemetics as needed; Reglan added for refractory symptoms if required.. -Hypoactive bowel sounds appreciated on exam; patient has continue experiencing loose BMs. -Stable electrolytes appreciated today. -continue PPI. -Patient has not been vaccinated -GI and general surgery consultation appreciated -CRP--3.3>>2.0>>17>>15 -Continue to follow inflammatory markers and close monitoring of patient renal function and LFTs.. -Most recent abdominal x-ray with likely-patient clinical improvement.  Coronary artery disease with history of NSTEMI -No chest pain presently -Restart Brilinta when able to fully  tolerate p.o. -Patient follows with Dr. Glenetta Hew -Beta-blocker has been resumed.  Diabetes mellitus type 2, controlled -Restart Jardiance once able to tolerate p.o. -Continue sliding scale insulin while inpatient. -03/07/20 A1C--6.7 -Treatment for COVID infection using the steroids has been started; CBGs expected to get elevated -Will add low-dose Lantus to assist with management.  Hyperlipidemia -Restart Crestor and Vascepa once able to tolerate p.o.  Pituitary Tumor  -Found in 08/28/16 Brain MRI  -Will continue outpatient follow up with Dr. Forde Dandy.   Status is: Inpatient  Remains  inpatient appropriate because: IV treatments appropriate due to intensity of illness or inability to take PO   Dispo: The patient is from:Home Anticipated d/c is  RC:[id number] Anticipated d/c date is: 2-3 days Patient currently is not medically stable to d/c.     Family Communication:spouse updated 03/10/20  Consultants:GI, general surgery  Code Status: FULL  DVT Prophylaxis: Kim Lovenox   Procedures: As Listed in Progress Note Above  Antibiotics: None  Subjective: No fever, no abdominal pain, no nausea, no vomiting.  Continues to have bowel movements.  Slightly short winded with exertion and requiring 2-3 L nasal cannula supplementation.  Heart rate is controlled.  Objective: Vitals:   03/12/20 0600 03/12/20 0700 03/12/20 0800 03/12/20 0818  BP: 129/74 104/64 110/61   Pulse: 82 82 82   Resp: (!) 25 (!) 23 (!) 21   Temp:   98.5 F (36.9 C)   TempSrc:   Oral   SpO2: 94% 90% 91% 93%  Weight:      Height:       No intake or output data in the 24 hours ending 03/12/20 1138 Weight change:   Exam: General exam: Alert, awake, oriented x 3; reports in general malaise and short winded sensation with activity.  Using 2-3 L nasal supplementation.  No nausea, no vomiting, no abdominal pain currently. Respiratory system: No using accessory muscles; requiring 2-3 L nasal cannula supplementation.  Positive tachypnea with exertion.  No wheezing, no crackles. Cardiovascular system:RRR. No murmurs, rubs, gallops. Gastrointestinal system: Abdomen is nondistended, soft and nontender. No organomegaly or masses felt. Normal bowel sounds heard. Central nervous system: Alert and oriented. No focal neurological deficits. Extremities: No cyanosis or clubbing. Skin: No petechiae. Psychiatry: Judgement and insight appear normal. Mood & affect appropriate.   Data Reviewed: I have personally reviewed following labs and imaging  studies  Basic Metabolic Panel: Recent Labs  Lab 03/07/20 0430 03/15/2020 0436 03/09/20 0558 03/10/20 0326 03/11/20 0443  NA 135 138 138 134* 138  K 4.0 3.6 3.8 3.0* 3.6  CL 102 96* 96* 93* 96*  CO2 24 30 31 28 29   GLUCOSE 215* 183* 203* 205* 218*  BUN 18 17 14  25* 27*  CREATININE 0.68 0.66 0.75 1.07 0.87  CALCIUM 9.0 9.1 8.9 9.1 8.9  MG 1.8 1.8 1.7 1.8 2.1  PHOS 2.7 2.8 3.1 4.2 2.5   Liver Function Tests: Recent Labs  Lab 03/07/20 0430 02/28/2020 0436 03/09/20 0558 03/10/20 0326 03/11/20 0443  AST 18 19 23  35 31  ALT 22 20 19 24 25   ALKPHOS 46 41 37* 41 38  BILITOT 1.5* 1.3* 0.8 0.6 0.6  PROT 7.2 6.6 6.4* 7.3 7.0  ALBUMIN 4.2 3.9 3.7 4.1 3.6   Recent Labs  Lab 03/21/2020 2240  LIPASE 21   CBC: Recent Labs  Lab 03/07/20 0430 02/21/2020 0436 03/09/20 0558 03/10/20 0326 03/11/20 0443  WBC 4.1 4.1 3.0* 4.5 6.9  NEUTROABS 2.4 2.6 2.1 3.3 5.3  HGB 17.4* 16.4 16.5 17.4* 17.0  HCT 50.3 48.3 47.6 49.4 48.6  MCV 90.8 92.0 90.8 89.2 90.5  PLT 176 164 139* 156 151   CBG: Recent Labs  Lab 03/11/20 1144 03/11/20 1601 03/12/20 0006 03/12/20 0509 03/12/20 0827  GLUCAP 213* 190* 207* 230* 216*   HbA1C: No results for input(s): HGBA1C in the last 72 hours.   Urine analysis:    Component Value Date/Time   COLORURINE YELLOW 04/04/2019 1623   APPEARANCEUR CLEAR 04/04/2019 1623   LABSPEC 1.038 (H) 04/04/2019 1623   PHURINE 5.0 04/04/2019 1623   GLUCOSEU >=500 (A) 04/04/2019 1623   HGBUR NEGATIVE 04/04/2019 1623   BILIRUBINUR NEGATIVE 04/04/2019 1623  KETONESUR 20 (A) 04/04/2019 1623   PROTEINUR NEGATIVE 04/04/2019 1623   NITRITE NEGATIVE 04/04/2019 1623   LEUKOCYTESUR NEGATIVE 04/04/2019 1623   Sepsis Labs:  Recent Results (from the past 240 hour(s))  Resp Panel by RT-PCR (Flu A&B, Covid) Nasopharyngeal Swab     Status: Abnormal   Collection Time: 02/26/2020 10:28 PM   Specimen: Nasopharyngeal Swab; Nasopharyngeal(NP) swabs in vial transport medium  Result  Value Ref Range Status   SARS Coronavirus 2 by RT PCR POSITIVE (A) NEGATIVE Final    Comment: RESULT CALLED TO, READ BACK BY AND VERIFIED WITH: M BRAIN AT 0018 ON 03/06/2020 BY JPM (NOTE) SARS-CoV-2 target nucleic acids are DETECTED.  The SARS-CoV-2 RNA is generally detectable in upper respiratory specimens during the acute phase of infection. Positive results are indicative of the presence of the identified virus, but do not rule out bacterial infection or co-infection with other pathogens not detected by the test. Clinical correlation with patient history and other diagnostic information is necessary to determine patient infection status. The expected result is Negative.  Fact Sheet for Patients: EntrepreneurPulse.com.au  Fact Sheet for Healthcare Providers: IncredibleEmployment.be  This test is not yet approved or cleared by the [place] FDA and  has been authorized for detection and/or diagnosis of SARS-CoV-2 by FDA under an Emergency Use Authorization (EUA).  This EUA will remain in effect (meaning this test can  be used) for the duration of  the COVID-19 declaration under Section 564(b)(1) of the Act, 21 U.S.C. section 360bbb-3(b)(1), unless the authorization is terminated or revoked sooner.     Influenza A by PCR NEGATIVE NEGATIVE Final   Influenza B by PCR NEGATIVE NEGATIVE Final    Comment: (NOTE) The Xpert Xpress SARS-CoV-2/FLU/RSV plus assay is intended as an aid in the diagnosis of influenza from Nasopharyngeal swab specimens and should not be used as a sole basis for treatment. Nasal washings and aspirates are unacceptable for Xpert Xpress SARS-CoV-2/FLU/RSV testing.  Fact Sheet for Patients: EntrepreneurPulse.com.au  Fact Sheet for Healthcare Providers: IncredibleEmployment.be  This test is not yet approved or cleared by the [place] FDA and has been authorized for detection  and/or diagnosis of SARS-CoV-2 by FDA under an Emergency Use Authorization (EUA). This EUA will remain in effect (meaning this test can be used) for the duration of the COVID-19 declaration under Section 564(b)(1) of the Act, 21 U.S.C. section 360bbb-3(b)(1), unless the authorization is terminated or revoked.  Performed at [facility], [address] [place], [place] [address]   MRSA PCR Screening     Status: None   Collection Time: 03/10/20 12:47 AM   Specimen: Nasal Mucosa; Nasopharyngeal  Result Value Ref Range Status   MRSA by PCR NEGATIVE NEGATIVE Final    Comment:        The GeneXpert MRSA Assay (FDA approved for NASAL specimens only), is one component of a comprehensive MRSA colonization surveillance program. It is not intended to diagnose MRSA infection nor to guide or monitor treatment for MRSA infections. Performed at [facility], [address] [place],  [address]      Scheduled Meds: . Chlorhexidine Gluconate Cloth  6 each Topical Daily  . enoxaparin (LOVENOX) injection  40 mg Subcutaneous Q24H  . insulin aspart  0-15 Units Subcutaneous Q4H  . insulin glargine  12 Units Subcutaneous QHS  . Ipratropium-Albuterol  1 puff Inhalation TID  . methylPREDNISolone (SOLU-MEDROL) injection  60 mg Intravenous Q12H  . metoprolol tartrate  2.5 mg Intravenous Q8H  . ondansetron (  ZOFRAN) IV  4 mg Intravenous Q6H  . pantoprazole (PROTONIX) IV  40 mg Intravenous Q12H   Continuous Infusions: . 0.9 % NaCl with KCl 20 mEq / L 100 mL/hr at 03/12/20 0518  . remdesivir 100 mg in NS 100 mL 100 mg (03/12/20 1000)    Procedures/Studies: DG Abd 1 View  Result Date: 03/09/2020 CLINICAL DATA:  Abdominal distension EXAM: ABDOMEN - 1 VIEW COMPARISON:  03/07/2020 FINDINGS: Air-filled, dilated loops of small bowel are again identified. Degree of distention is similar but there are increased dilated loops. No definite free air on this supine view. Colon appears to be decompressed.  IMPRESSION: Similar degree of small-bowel distention with increased number of dilated loops visualized. Electronically Signed   By: Guadlupe Spanish M.D.   On: 03/09/2020 08:08   CT ABDOMEN PELVIS W CONTRAST  Result Date: 03/06/2020 CLINICAL DATA:  Abdominal pain EXAM: CT ABDOMEN AND PELVIS WITH CONTRAST TECHNIQUE: Multidetector CT imaging of the abdomen and pelvis was performed using the standard protocol following bolus administration of intravenous contrast. CONTRAST:  OMNIPAQUE IOHEXOL 300 MG/ML  SOLN COMPARISON:  April 04, 2019 FINDINGS: Lower chest: The visualized heart size within normal limits. No pericardial fluid/thickening. No hiatal hernia. The visualized portions of the lungs are clear. Hepatobiliary: The liver is normal in density without focal abnormality.The main portal vein is patent. The patient is status post cholecystectomy. No biliary ductal dilation. Pancreas: Unremarkable. No pancreatic ductal dilatation or surrounding inflammatory changes. Spleen: Normal in size without focal abnormality. Adrenals/Urinary Tract: Both adrenal glands appear normal. The kidneys and collecting system appear normal without evidence of urinary tract calculus or hydronephrosis. Bladder is unremarkable. Stomach/Bowel: The stomach and proximal small bowel are unremarkable. There are mildly dilated air and fluid-filled loops of ileum measuring up to 3.2 cm. No clear transition point or focal wall narrowing is noted. No surrounding inflammatory changes are seen. There is been a prior colectomy with an ileocolonic anastomosis within the deep pelvis. Vascular/Lymphatic: There are no enlarged mesenteric, retroperitoneal, or pelvic lymph nodes. Scattered aortic atherosclerotic calcifications are seen without aneurysmal dilatation. Reproductive: The prostate is unremarkable. Other: No evidence of abdominal wall mass or hernia. Musculoskeletal: No acute or significant osseous findings. IMPRESSION: Mildly dilated  fluid and air-filled ileal loops without a clear transition point. This is likely due to a partial small bowel obstruction. Prior colectomy with anastomosis within the deep pelvis. Aortic Atherosclerosis (ICD10-I70.0). Electronically Signed   By: Jonna Clark M.D.   On: 03/06/2020 00:46   DG Abd 2 Views  Result Date: 03/10/2020 CLINICAL DATA:  Small-bowel obstruction. EXAM: ABDOMEN - 2 VIEW COMPARISON:  03/09/2020.  CT 03/06/2020. FINDINGS: Surgical clips right upper quadrant. Persistent prominent small bowel distention noted. Prior colectomy. No free air. Degenerative changes lumbar spine and both hips. IMPRESSION: Persistent prominent small bowel distention. Findings consistent with persistent small bowel obstruction. Prior colectomy. No free air. Electronically Signed   By: Maisie Fus  Register   On: 03/10/2020 05:37   DG Abd 2 Views  Result Date: 03/07/2020 CLINICAL DATA:  Acute generalized abdominal pain. EXAM: ABDOMEN - 2 VIEW COMPARISON:  April 08, 2019.  March 06, 2020. FINDINGS: Moderately dilated small bowel loops are noted concerning for distal small bowel obstruction. No definite free air is noted. No colonic dilatation is noted. Status post cholecystectomy. No abnormal calcifications are noted. IMPRESSION: Moderately dilated small bowel loops are noted concerning for distal small bowel obstruction. Electronically Signed   By: Zenda Alpers.D.  On: 03/07/2020 09:32    Barton Dubois, MD  [facility]  If 7PM-7AM, please contact night-coverage [email or web address] Password [facility] 03/12/2020, 11:38 AM   LOS: 6 days

## 2020-03-12 NOTE — Progress Notes (Signed)
Patient very "tired" today, multiple attempts made to try and get up and out of bed. Patient stating he is too tired, and not sleeping well at night. Encouraged to sit at edge of bed. Patient tolerating well. Vitals stable. Continues to have intermittent nausea today with no vomiting.

## 2020-03-13 DIAGNOSIS — A0839 Other viral enteritis: Secondary | ICD-10-CM

## 2020-03-13 DIAGNOSIS — R140 Abdominal distension (gaseous): Secondary | ICD-10-CM | POA: Diagnosis not present

## 2020-03-13 DIAGNOSIS — E785 Hyperlipidemia, unspecified: Secondary | ICD-10-CM

## 2020-03-13 DIAGNOSIS — U071 COVID-19: Secondary | ICD-10-CM

## 2020-03-13 DIAGNOSIS — R112 Nausea with vomiting, unspecified: Secondary | ICD-10-CM

## 2020-03-13 DIAGNOSIS — I2510 Atherosclerotic heart disease of native coronary artery without angina pectoris: Secondary | ICD-10-CM

## 2020-03-13 DIAGNOSIS — Z9861 Coronary angioplasty status: Secondary | ICD-10-CM

## 2020-03-13 DIAGNOSIS — E1165 Type 2 diabetes mellitus with hyperglycemia: Secondary | ICD-10-CM

## 2020-03-13 DIAGNOSIS — I10 Essential (primary) hypertension: Secondary | ICD-10-CM | POA: Diagnosis not present

## 2020-03-13 DIAGNOSIS — K56600 Partial intestinal obstruction, unspecified as to cause: Secondary | ICD-10-CM

## 2020-03-13 LAB — COMPREHENSIVE METABOLIC PANEL
ALT: 20 U/L (ref 0–44)
AST: 25 U/L (ref 15–41)
Albumin: 3.1 g/dL — ABNORMAL LOW (ref 3.5–5.0)
Alkaline Phosphatase: 36 U/L — ABNORMAL LOW (ref 38–126)
Anion gap: 9 (ref 5–15)
BUN: 15 mg/dL (ref 8–23)
CO2: 27 mmol/L (ref 22–32)
Calcium: 8.6 mg/dL — ABNORMAL LOW (ref 8.9–10.3)
Chloride: 101 mmol/L (ref 98–111)
Creatinine, Ser: 0.64 mg/dL (ref 0.61–1.24)
GFR, Estimated: >60 mL/min (ref >60)
Glucose, Bld: 238 mg/dL — ABNORMAL HIGH (ref 70–99)
Potassium: 4.1 mmol/L (ref 3.5–5.1)
Sodium: 137 mmol/L (ref 135–145)
Total Bilirubin: 1 mg/dL (ref 0.3–1.2)
Total Protein: 5.9 g/dL — ABNORMAL LOW (ref 6.5–8.1)

## 2020-03-13 LAB — CBC
HCT: 46.4 % (ref 39.0–52.0)
Hemoglobin: 16.3 g/dL (ref 13.0–17.0)
MCH: 32.1 pg (ref 26.0–34.0)
MCHC: 35.1 g/dL (ref 30.0–36.0)
MCV: 91.5 fL (ref 80.0–100.0)
Platelets: 205 10*3/uL (ref 150–400)
RBC: 5.07 MIL/uL (ref 4.22–5.81)
RDW: 12.1 % (ref 11.5–15.5)
WBC: 13.8 10*3/uL — ABNORMAL HIGH (ref 4.0–10.5)
nRBC: 0 % (ref 0.0–0.2)

## 2020-03-13 LAB — GLUCOSE, CAPILLARY
Glucose-Capillary: 161 mg/dL — ABNORMAL HIGH (ref 70–99)
Glucose-Capillary: 195 mg/dL — ABNORMAL HIGH (ref 70–99)
Glucose-Capillary: 209 mg/dL — ABNORMAL HIGH (ref 70–99)
Glucose-Capillary: 216 mg/dL — ABNORMAL HIGH (ref 70–99)
Glucose-Capillary: 217 mg/dL — ABNORMAL HIGH (ref 70–99)
Glucose-Capillary: 243 mg/dL — ABNORMAL HIGH (ref 70–99)

## 2020-03-13 LAB — C-REACTIVE PROTEIN: CRP: 2.6 mg/dL — ABNORMAL HIGH (ref <1.0)

## 2020-03-13 LAB — FERRITIN: Ferritin: 838 ng/mL — ABNORMAL HIGH (ref 24–336)

## 2020-03-13 LAB — D-DIMER, QUANTITATIVE: D-Dimer, Quant: 0.7 ug/mL-FEU — ABNORMAL HIGH (ref 0.00–0.50)

## 2020-03-13 MED ORDER — SIMETHICONE 80 MG PO CHEW
80.0000 mg | CHEWABLE_TABLET | Freq: Four times a day (QID) | ORAL | Status: AC
  Administered 2020-03-13 (×3): 80 mg via ORAL
  Filled 2020-03-13 (×3): qty 1

## 2020-03-13 MED ORDER — SIMETHICONE 80 MG PO CHEW
160.0000 mg | CHEWABLE_TABLET | Freq: Once | ORAL | Status: AC
  Administered 2020-03-13: 160 mg via ORAL
  Filled 2020-03-13: qty 2

## 2020-03-13 NOTE — Progress Notes (Signed)
Patients abdomen continues to appear distended. He has had no other bouts of vomiting. All vitals have remained within normal limits.

## 2020-03-13 NOTE — H&P (Incomplete)
Patient abdomen appears distended more concentrated left outer abdomen. he also had  Vomited non bloody bile. MD called to the floor for observation of the patient. Per MD patient should have an NGT to which patient is currently  refusing. Vitals appears within normal limits. I will continue to monitor patient closely

## 2020-03-13 NOTE — Progress Notes (Signed)
GI attending note  History of present illness  Dr. Dyann Kief asked me to reevaluate patient as he is not improving as expected. Patient is well-known to me since he was diagnosed with fulminant pan ulcerative colitis by me in March 2003 and ended up with proctocolectomy and ileal pouch anal anastomosis within 4 days of diagnosis by Dr. Cheryll Cockayne of [facility]. He has been under care of Dr. Zenovia Jarred. Patient's present illness began on 03/21/2020 when he became sick after eating a meal at work.  He was home prepared meal.  He felt weak and noted loose stools.  He did not experience melena or rectal bleeding fever or chills.  He had nausea but no vomiting.  He was not feeling any better by the next day when he decided to come to emergency room.  This time he was also having abdominal pain. Evaluation in emergency room revealed normal vital signs and normal CBC except hemoglobin was 17.7.  Comprehensive chemistry panel was unremarkable except glucose of 232 and bilirubin of 1.4 but normal AST ALT and transaminases.  He tested negative for influenza a and B by PCR but was positive for COVID.  Abdominal pelvic CT revealed dilated loops of small bowel with air-fluid levels and evidence of prior colectomy and anastomosis.  There was no wall thickening. Patient was felt to have partial small bowel obstruction.  He was not having any respiratory symptoms pertaining to COVID 19 viral infection and was decided to observe him. Initial GI consultation was performed by Dr. Gala Romney on 03/06/2020.  Dr. Gala Romney felt that his symptoms are not due to pouchitis for which she has been treated in the past.  Mild hyperbilirubinemia suspected to be due to Gilbert's syndrome.  Supportive therapy was recommended. Surgical consultation was also obtained with Dr. Arnoldo Morale. NG tube could not be placed because of history of deflected nasal septum.  NG tube was placed by Dr. Jenetta Downer 03/12/2020.  NG tube unfortunately came out accidentally  about 8 hours later.  Patient does not recall that he felt any better after NG tube was placed. In the meantime his CRP increased significantly and he was begun on remdesivir on 03/11/2020.  He also has been on methylprednisolone, pantoprazole and metoclopramide on as-needed basis.  Patient was feeling much better yesterday.  His diet was advanced.  However he vomited last evening.  Vomitus consisted primarily of bilious material.  His diet was changed to clear liquids. Patient states he has not passed any flatus since yesterday.  He has not had any bowel movement today.  He denies abdominal pain.  He actually feels hungry.  He denies nausea this afternoon.  He is not sure how much weight he has lost during this illness.  He does not have cough chest pain or shortness of breath.  He has not experienced fever chills or night sweats during this admission. He elected not to receive COVID-vaccine.   Current Medications:  Current Facility-Administered Medications:  .  0.9 % NaCl with KCl 20 mEq/ L  infusion, , Intravenous, Continuous, Barton Dubois, MD, Last Rate: 100 mL/hr at 03/13/20 1206, Infusion Verify at 03/13/20 1206 .  acetaminophen (TYLENOL) tablet 650 mg, 650 mg, Oral, Q6H PRN, Barton Dubois, MD .  Chlorhexidine Gluconate Cloth 2 % PADS 6 each, 6 each, Topical, Daily, Barton Dubois, MD, 6 each at 03/13/20 [phone] .  enoxaparin (LOVENOX) injection 40 mg, 40 mg, Subcutaneous, Q24H, Barton Dubois, MD, 40 mg at 03/12/20 2317 .  fentaNYL (SUBLIMAZE) injection  100 mcg, 100 mcg, Intravenous, Q3H PRN, Barton Dubois, MD, 100 mcg at 03/13/20 6237 .  hyoscyamine (LEVSIN) 0.5 MG/ML injection 0.5 mg, 0.5 mg, Intravenous, Q8H PRN, Barton Dubois, MD, 0.5 mg at 03/13/20 0929 .  insulin aspart (novoLOG) injection 0-15 Units, 0-15 Units, Subcutaneous, Q4H, Barton Dubois, MD, 5 Units at 03/13/20 1156 .  insulin glargine (LANTUS) injection 12 Units, 12 Units, Subcutaneous, QHS, Barton Dubois, MD, 12 Units at  03/12/20 2318 .  Ipratropium-Albuterol (COMBIVENT) respimat 1 puff, 1 puff, Inhalation, TID, Barton Dubois, MD, 1 puff at 03/13/20 [phone] .  methylPREDNISolone sodium succinate (SOLU-MEDROL) 125 mg/2 mL injection 60 mg, 60 mg, Intravenous, Q12H, Barton Dubois, MD, 60 mg at 03/13/20 1157 .  metoCLOPramide (REGLAN) injection 10 mg, 10 mg, Intravenous, Q8H PRN, Barton Dubois, MD, 10 mg at 03/10/20 1444 .  metoprolol tartrate (LOPRESSOR) injection 2.5 mg, 2.5 mg, Intravenous, Q8H, Barton Dubois, MD, 2.5 mg at 03/13/20 0605 .  ondansetron (ZOFRAN) injection 4 mg, 4 mg, Intravenous, Q6H, Barton Dubois, MD, 4 mg at 03/13/20 1156 .  pantoprazole (PROTONIX) injection 40 mg, 40 mg, Intravenous, Q12H, Barton Dubois, MD, 40 mg at 03/13/20 1517 .  phenol (CHLORASEPTIC) mouth spray 1 spray, 1 spray, Mouth/Throat, PRN, Barton Dubois, MD, 1 spray at 03/10/20 0430 .  promethazine (PHENERGAN) injection 12.5 mg, 12.5 mg, Intravenous, Q6H PRN, Barton Dubois, MD, 12.5 mg at 03/13/20 0316 .  remdesivir 100 mg in sodium chloride 0.9 % 100 mL IVPB, 100 mg, Intravenous, Daily, Barton Dubois, MD, Stopped at 03/13/20 6160  Past Medical History:  Diagnosis Date  . CAD S/P DES PCI-circumflex 07/22/2014   PCI mCx: PCI: 2.75 x 18 mm Xience Alpine DES to mid LCx at takeoff of OM1 (OM1 jailed 50% stenosis)  . Diabetes ([place])   . Erythrocytosis 11/28/2012  . Essential hypertension   . Fatigue 11/28/2012  . Gout 2014  . Hyperlipidemia with target LDL less than 70   . Hypertriglyceridemia 11/16/2014  . Kidney stones   . Non-STEMI (non-ST elevated myocardial infarction) ([place]) 07/22/2014   a) RCA: OK, LM: Okay ; LAD: Proximal to mid 40%; RI: 30%;LCx: Mid to distal 90%, distal 20%, OM1 50% --> DES PCI  . Ulcerative colitis    s/p ilioanal anastomosis (colectomy) ; has had multiple episodes of pouchitis.   Past Surgical History:  Procedure Laterality Date  . APPENDECTOMY    . CARDIAC CATHETERIZATION N/A 07/23/2014    Procedure: Left Heart Cath and Coronary Angiography;  Surgeon: Leonie Man, MD;  Location: [place] [facility];  Service: Cardiovascular;  RCA: Mild disease; LM: Okay ; LAD: Proximal to mid 40%; RI: 30%;LCx: Mid to distal 90%, distal 20%, OM1 50%; Mid inferolateral HK, EF 55-65%   . CARDIAC CATHETERIZATION N/A 07/23/2014   Procedure: Coronary Stent Intervention;  Surgeon: Leonie Man, MD;  Location: [place] [facility];  Service: Cardiovascular; PCI: 2.75 x 18 mm Xience Alpine DES to mid LCx at takeoff of OM1 (OM1 jailed 50% stenosis)  . CHOLECYSTECTOMY    . COLECTOMY    . ESOPHAGOGASTRODUODENOSCOPY (EGD) WITH PROPOFOL N/A 03/15/2020   Procedure: ESOPHAGOGASTRODUODENOSCOPY (EGD) WITH PROPOFOL WITH PLACEMENT OF NASO-GASTRIC TUBE;  Surgeon: Harvel Quale, MD;  Location: [facility];  Service: Gastroenterology;  Laterality: N/A;  . HERNIA REPAIR     umbilical  . [place]oanalanastomosis  04/2001, 11/2001, 03/2202   has a J pouch  . TRANSTHORACIC ECHOCARDIOGRAM  07/22/2014   EF 55-60%, normal wall motion, normal diastolic function,  no  valve lesions      Objective: Blood pressure (!) 142/76, pulse 87, temperature 98.4 F (36.9 C), temperature source Oral, resp. rate (!) 27, height '6\' 6"'  (1.981 m), weight 99.3 kg, SpO2 90 %.  Patient is alert and in no acute distress. Conjunctivae is pink, sclera is nonicteric Oropharyngeal mucosa is dry otherwise normal No neck masses or thyromegaly noted Cardiac exam with regular rhythm normal S1 and S2.  No murmur gallop noted. Auscultation of lungs reveal vesicular breath sounds bilaterally. Abdomen is full.  He has long midline scar extending into the suprapubic region as well as scar in right lower quadrant site of prior ileostomy.  Bowel sounds are hypoactive.  Percussion note is very tympanitic.  On palpation abdomen is soft and nontender.  He does not have inguinal hernia organomegaly or masses No clubbing or peripheral edema  noted.  Labs/studies Results:  CBC Latest Ref Rng & Units 03/13/2020 03/11/2020 03/10/2020  WBC 4.0 - 10.5 K/uL 13.8(H) 6.9 4.5  Hemoglobin 13.0 - 17.0 g/dL 16.3 17.0 17.4(H)  Hematocrit 39.0 - 52.0 % 46.4 48.6 49.4  Platelets 150 - 400 K/uL 205 151 156    CMP Latest Ref Rng & Units 03/13/2020 03/11/2020 03/10/2020  Glucose 70 - 99 mg/dL 238(H) 218(H) 205(H)  BUN 8 - 23 mg/dL 15 27(H) 25(H)  Creatinine 0.61 - 1.24 mg/dL 0.64 0.87 1.07  Sodium 135 - 145 mmol/L 137 138 134(L)  Potassium 3.5 - 5.1 mmol/L 4.1 3.6 3.0(L)  Chloride 98 - 111 mmol/L 101 96(L) 93(L)  CO2 22 - 32 mmol/L '27 29 28  ' Calcium 8.9 - 10.3 mg/dL 8.6(L) 8.9 9.1  Total Protein 6.5 - 8.1 g/dL 5.9(L) 7.0 7.3  Total Bilirubin 0.3 - 1.2 mg/dL 1.0 0.6 0.6  Alkaline Phos 38 - 126 U/L 36(L) 38 41  AST 15 - 41 U/L 25 31 35  ALT 0 - 44 U/L '20 25 24    ' Hepatic Function Latest Ref Rng & Units 03/13/2020 03/11/2020 03/10/2020  Total Protein 6.5 - 8.1 g/dL 5.9(L) 7.0 7.3  Albumin 3.5 - 5.0 g/dL 3.1(L) 3.6 4.1  AST 15 - 41 U/L 25 31 35  ALT 0 - 44 U/L '20 25 24  ' Alk Phosphatase 38 - 126 U/L 36(L) 38 41  Total Bilirubin 0.3 - 1.2 mg/dL 1.0 0.6 0.6  Bilirubin, Direct 0.00 - 0.40 mg/dL - - -    Lab Results  Component Value Date   CRP 2.6 (H) 03/13/2020    I have reviewed patient's abdominal pelvic CT from 03/07/2019 along with abdominal series from January 17, 19 and 20. I also asked Dr. Abigail Miyamoto review CT with me today. CT from admission reveals dilated loops of small bowel with air-fluid levels but no transition zone.  Ileoileal pouch anastomosis appears to be patent. Evidence of prior colectomy and anastomosis. Acute abdominal series revealed dilated loops of small bowel concerning for bowel obstruction.  Assessment:  #1.  Acute illness appears to be secondary to partial small bowel obstruction most likely due to adhesions and he has history of small bowel obstruction in the past but CT on admission did not reveal transition  zone.  I wonder if his illness as involved into small bowel ileus which may be related to COVID infection.  If small bowel function does not improve would consider repeat abdominal pelvic CT prior to considering examination of pouch and anastomosis.  #2.  COVID viral infection.  He did not have any pulmonary complications.  His CRP  did increase significantly and he has been on methylprednisolone remdesivir for the last 2 days.  #3.  Diabetes mellitus.  Patient is on sliding scale coverage with NovoLog and nightly Lantus.  #4.  Coronary artery disease.  No ongoing issues.  Recommendations  Simethicone 160 mg p.o. now and thereafter 80 mg 4 times daily for 4 doses. Patient will be reevaluated in a.m and determine course of action.

## 2020-03-13 NOTE — Progress Notes (Signed)
PROGRESS NOTE  Calvin Cruz [id number] DOB: [birth year] DOA: 03/09/2020 PCP: Reynold Bowen, MD  Brief History:  60 year old male with a history ofsecondarypolycythemia(JAK2 neg), hyperlipidemia, diabetes mellitus type 2, hypertension, coronary artery disease status post NSTEMI with DES to the circumflex June 2016, ulcerative colitis status post proctocolectomy with ileoanal anastomosis, and recurrent episodes of pouchitis presenting with upper abdominal pain, nausea, vomiting, and loose stools that began on 03/04/2020. He states that he normally has 5-6 loose bowel movements on average day secondary to his ulcerative colitis. He normally uses Imodium, usually 1 to 2 tablets on a daily basis. His last bowel movement was on 02/27/2020. He denies any fevers, chills, chest pain, shortness of breath, cough, hemoptysis. He denies any headache, sore throat, hemoptysis. He has had numerous episodes of emesis on 03/17/2020 resulting in dry heaving. There is no hematemesis. He denies hematochezia or melena.He has not had a bowel movement since the afternoon of 03/12/2020. In the emergency department, the patient was afebrile hemodynamically stable with oxygen saturation 94-96% on room air. BMP showed a sodium 135, potassium 4.1, BUN 14, serum creatinine 0.81. LFTs were unremarkable. Lipase 21. WBC 7.1, hemoglobin 17.7, platelets 174,000. CT of the abdomen and pelvis showed mildly dilated air-fluid filled loops of ileum up to 3.2 cm. There was no clear transition point or focal narrowing. There is no surrounding inflammatory changes. He is status post colectomy with ileal colonic anastomosis.  Assessment/Plan: Partial small bowel obstruction -continue judicious use of narcotics; goal is not for pain free; but to assist tolerating symptoms.   -Continue the use of levsin as needed . -Continue PRN antiemetics.  -Per GI recommendations will use simethicone -Clear liquid diet will  be tried. -Soft/nondistended or tender abdomen. -Holding on further advancing diet based on overnight episode of vomiting.  Hypokalemia -In the setting of GI losses -Continue to follow electrolytes trend and further replete as needed -Receiving maintenance fluids with potassium at this time.  COVID-19 gastroenteritis/Infection -Patient was found to be incidentally COVID-19 positive by RT-PCR -pt's symptoms with minimal pulm symptoms of < 5 days duration with "high risk" criteria-currently using 2-3 L nasal cannula supplementation intermittently. -With changes in inflammatory markers, fever and also presence of tachypnea will initiated on steroids and remdesivir.  Day 3 out of 5. -Will continue antiemetics as needed; Reglan added for refractory symptoms if required.. -Hypoactive bowel sounds appreciated on exam; patient's abdomen soft and mildly tender. -Stable electrolytes appreciated today. -continue PPI. -Patient has not been vaccinated -Appreciate GI consultation; simethicone to assist with over tympanic and obstipation symptoms.  -CRP--3.3>>2.0>>17>>15>>2.6 -Ferritin 838. -Continue to follow inflammatory markers and close monitoring of patient renal function and LFTs.. -My ended requiring a repeat CT abdomen in the next 24-30 hours based on clinical improvement. -Okay for clear liquid diet.  Coronary artery disease with history of NSTEMI -No chest pain presently -Restart Brilinta when able to fully  tolerate p.o. -Patient follows with Dr. Glenetta Hew -Beta-blocker has been resumed.  Diabetes mellitus type 2, controlled -Restart Jardiance once able to tolerate p.o. -Continue sliding scale insulin while inpatient. -03/07/20 A1C--6.7 -Treatment for COVID infection using the steroids has been started; CBGs expected to get elevated -Will add low-dose Lantus to assist with management.  Hyperlipidemia -Restart Crestor and Vascepa once able to tolerate p.o.  Pituitary Tumor   -Found in 08/28/16 Brain MRI  -Will continue outpatient follow up with Dr. Forde Dandy.   Status is: Inpatient  Remains inpatient  appropriate because: IV treatments appropriate due to intensity of illness or inability to take PO   Dispo: The patient is from:Home Anticipated d/c is [id number] Anticipated d/c date is: 2-3 days Patient currently is not medically stable to d/c.     Family Communication:spouse updated 03/10/20  Consultants:GI, general surgery  Code Status: FULL  DVT Prophylaxis: [place] Lovenox   Procedures: As Listed in Progress Note Above  Antibiotics: None  Subjective: No fever, mild abdominal discomfort reported; no nausea or vomiting at this time; overnight with episode of nausea, abdominal pain and bilious vomiting.  Patient is using 4-5 L nasal cannula supplementation  Objective: Vitals:   03/13/20 1500 03/13/20 1558 03/13/20 1600 03/13/20 1608  BP:    (!) 133/59  Pulse: 80  80 73  Resp: (!) 33  (!) 34 (!) 26  Temp:  98.1 F (36.7 C)    TempSrc:  Oral    SpO2: (!) 86%  (!) 82% (!) 86%  Weight:      Height:        Intake/Output Summary (Last 24 hours) at 03/13/2020 1714 Last data filed at 03/13/2020 1603 Gross per 24 hour  Intake 2537.92 ml  Output 900 ml  Net 1637.92 ml   Weight change:   Exam: General exam: Alert, awake, oriented x 3; patient reported no further vomiting this morning.  Overnight with nausea, bilious vomiting and abdominal discomfort.  He is afebrile.   Respiratory system: Mild tachypnea appreciated; using 4-5 L nasal cannula supplementation.  Reports no chest pain. Cardiovascular system: RRR. No murmurs, rubs, gallops.  No JVD. Gastrointestinal system: Abdomen is mildly distended, soft, nontender, tympanic on percussion and with hypoactive bowel sounds.   Central nervous system: Alert and oriented. No focal neurological deficits. Extremities: No cyanosis or  clubbing. Skin: No petechiae Psychiatry: Judgement and insight appear normal. Mood & affect appropriate.    Data Reviewed: I have personally reviewed following labs and imaging studies  Basic Metabolic Panel: Recent Labs  Lab 03/07/20 0430 03/20/2020 0436 03/09/20 0558 03/10/20 0326 03/11/20 0443 03/13/20 0421  NA 135 138 138 134* 138 137  K 4.0 3.6 3.8 3.0* 3.6 4.1  CL 102 96* 96* 93* 96* 101  CO2 24 30 31 28 29 27   GLUCOSE 215* 183* 203* 205* 218* 238*  BUN 18 17 14  25* 27* 15  CREATININE 0.68 0.66 0.75 1.07 0.87 0.64  CALCIUM 9.0 9.1 8.9 9.1 8.9 8.6*  MG 1.8 1.8 1.7 1.8 2.1  --   PHOS 2.7 2.8 3.1 4.2 2.5  --    Liver Function Tests: Recent Labs  Lab 02/26/2020 0436 03/09/20 0558 03/10/20 0326 03/11/20 0443 03/13/20 0421  AST 19 23 35 31 25  ALT 20 19 24 25 20   ALKPHOS 41 37* 41 38 36*  BILITOT 1.3* 0.8 0.6 0.6 1.0  PROT 6.6 6.4* 7.3 7.0 5.9*  ALBUMIN 3.9 3.7 4.1 3.6 3.1*   CBC: Recent Labs  Lab 03/07/20 0430 03/06/2020 0436 03/09/20 0558 03/10/20 0326 03/11/20 0443 03/13/20 0421  WBC 4.1 4.1 3.0* 4.5 6.9 13.8*  NEUTROABS 2.4 2.6 2.1 3.3 5.3  --   HGB 17.4* 16.4 16.5 17.4* 17.0 16.3  HCT 50.3 48.3 47.6 49.4 48.6 46.4  MCV 90.8 92.0 90.8 89.2 90.5 91.5  PLT 176 164 139* 156 151 205   CBG: Recent Labs  Lab 03/12/20 2311 03/13/20 0322 03/13/20 0827 03/13/20 1155 03/13/20 1556  GLUCAP 227* 217* 195* 216* 243*   HbA1C: No results for input(s): HGBA1C  in the last 72 hours.   Urine analysis:    Component Value Date/Time   COLORURINE YELLOW 04/04/2019 1623   APPEARANCEUR CLEAR 04/04/2019 1623   LABSPEC 1.038 (H) 04/04/2019 1623   PHURINE 5.0 04/04/2019 1623   GLUCOSEU >=500 (A) 04/04/2019 1623   HGBUR NEGATIVE 04/04/2019 1623   BILIRUBINUR NEGATIVE 04/04/2019 1623   KETONESUR 20 (A) 04/04/2019 1623   PROTEINUR NEGATIVE 04/04/2019 1623   NITRITE NEGATIVE 04/04/2019 1623   LEUKOCYTESUR NEGATIVE 04/04/2019 1623   Sepsis Labs:  Recent Results  (from the past 240 hour(s))  Resp Panel by RT-PCR (Flu A&B, Covid) Nasopharyngeal Swab     Status: Abnormal   Collection Time: 03-25-20 10:28 PM   Specimen: Nasopharyngeal Swab; Nasopharyngeal(NP) swabs in vial transport medium  Result Value Ref Range Status   SARS Coronavirus 2 by RT PCR POSITIVE (A) NEGATIVE Final    Comment: RESULT CALLED TO, READ BACK BY AND VERIFIED WITH: M BRAIN AT 0018 ON 03/06/2020 BY JPM (NOTE) SARS-CoV-2 target nucleic acids are DETECTED.  The SARS-CoV-2 RNA is generally detectable in upper respiratory specimens during the acute phase of infection. Positive results are indicative of the presence of the identified virus, but do not rule out bacterial infection or co-infection with other pathogens not detected by the test. Clinical correlation with patient history and other diagnostic information is necessary to determine patient infection status. The expected result is Negative.  Fact Sheet for Patients: EntrepreneurPulse.com.au  Fact Sheet for Healthcare Providers: IncredibleEmployment.be  This test is not yet approved or cleared by the [place] FDA and  has been authorized for detection and/or diagnosis of SARS-CoV-2 by FDA under an Emergency Use Authorization (EUA).  This EUA will remain in effect (meaning this test can  be used) for the duration of  the COVID-19 declaration under Section 564(b)(1) of the Act, 21 U.S.C. section 360bbb-3(b)(1), unless the authorization is terminated or revoked sooner.     Influenza A by PCR NEGATIVE NEGATIVE Final   Influenza B by PCR NEGATIVE NEGATIVE Final    Comment: (NOTE) The Xpert Xpress SARS-CoV-2/FLU/RSV plus assay is intended as an aid in the diagnosis of influenza from Nasopharyngeal swab specimens and should not be used as a sole basis for treatment. Nasal washings and aspirates are unacceptable for Xpert Xpress SARS-CoV-2/FLU/RSV testing.  Fact Sheet for  Patients: EntrepreneurPulse.com.au  Fact Sheet for Healthcare Providers: IncredibleEmployment.be  This test is not yet approved or cleared by the [place] FDA and has been authorized for detection and/or diagnosis of SARS-CoV-2 by FDA under an Emergency Use Authorization (EUA). This EUA will remain in effect (meaning this test can be used) for the duration of the COVID-19 declaration under Section 564(b)(1) of the Act, 21 U.S.C. section 360bbb-3(b)(1), unless the authorization is terminated or revoked.  Performed at [facility], [address] [place], [place] [address]   MRSA PCR Screening     Status: None   Collection Time: 03/10/20 12:47 AM   Specimen: Nasal Mucosa; Nasopharyngeal  Result Value Ref Range Status   MRSA by PCR NEGATIVE NEGATIVE Final    Comment:        The GeneXpert MRSA Assay (FDA approved for NASAL specimens only), is one component of a comprehensive MRSA colonization surveillance program. It is not intended to diagnose MRSA infection nor to guide or monitor treatment for MRSA infections. Performed at [facility], [address], [place], [place] [address]      Scheduled Meds: . Chlorhexidine Gluconate Cloth  6  each Topical Daily  . enoxaparin (LOVENOX) injection  40 mg Subcutaneous Q24H  . insulin aspart  0-15 Units Subcutaneous Q4H  . insulin glargine  12 Units Subcutaneous QHS  . Ipratropium-Albuterol  1 puff Inhalation TID  . methylPREDNISolone (SOLU-MEDROL) injection  60 mg Intravenous Q12H  . metoprolol tartrate  2.5 mg Intravenous Q8H  . ondansetron (ZOFRAN) IV  4 mg Intravenous Q6H  . pantoprazole (PROTONIX) IV  40 mg Intravenous Q12H  . simethicone  80 mg Oral QID   Continuous Infusions: . 0.9 % NaCl with KCl 20 mEq / L 100 mL/hr at 03/13/20 1603  . remdesivir 100 mg in NS 100 mL Stopped (03/13/20 0958)    Procedures/Studies: DG Abd 1 View  Result Date: 03/09/2020 CLINICAL DATA:  Abdominal  distension EXAM: ABDOMEN - 1 VIEW COMPARISON:  03/07/2020 FINDINGS: Air-filled, dilated loops of small bowel are again identified. Degree of distention is similar but there are increased dilated loops. No definite free air on this supine view. Colon appears to be decompressed. IMPRESSION: Similar degree of small-bowel distention with increased number of dilated loops visualized. Electronically Signed   By: Macy Mis M.D.   On: 03/09/2020 08:08   CT ABDOMEN PELVIS W CONTRAST  Result Date: 03/06/2020 CLINICAL DATA:  Abdominal pain EXAM: CT ABDOMEN AND PELVIS WITH CONTRAST TECHNIQUE: Multidetector CT imaging of the abdomen and pelvis was performed using the standard protocol following bolus administration of intravenous contrast. CONTRAST:  179mL OMNIPAQUE IOHEXOL 300 MG/ML  SOLN COMPARISON:  April 04, 2019 FINDINGS: Lower chest: The visualized heart size within normal limits. No pericardial fluid/thickening. No hiatal hernia. The visualized portions of the lungs are clear. Hepatobiliary: The liver is normal in density without focal abnormality.The main portal vein is patent. The patient is status post cholecystectomy. No biliary ductal dilation. Pancreas: Unremarkable. No pancreatic ductal dilatation or surrounding inflammatory changes. Spleen: Normal in size without focal abnormality. Adrenals/Urinary Tract: Both adrenal glands appear normal. The kidneys and collecting system appear normal without evidence of urinary tract calculus or hydronephrosis. Bladder is unremarkable. Stomach/Bowel: The stomach and proximal small bowel are unremarkable. There are mildly dilated air and fluid-filled loops of ileum measuring up to 3.2 cm. No clear transition point or focal wall narrowing is noted. No surrounding inflammatory changes are seen. There is been a prior colectomy with an ileocolonic anastomosis within the deep pelvis. Vascular/Lymphatic: There are no enlarged mesenteric, retroperitoneal, or pelvic lymph  nodes. Scattered aortic atherosclerotic calcifications are seen without aneurysmal dilatation. Reproductive: The prostate is unremarkable. Other: No evidence of abdominal wall mass or hernia. Musculoskeletal: No acute or significant osseous findings. IMPRESSION: Mildly dilated fluid and air-filled ileal loops without a clear transition point. This is likely due to a partial small bowel obstruction. Prior colectomy with anastomosis within the deep pelvis. Aortic Atherosclerosis (ICD10-I70.0). Electronically Signed   By: Prudencio Pair M.D.   On: 03/06/2020 00:46   DG Abd 2 Views  Result Date: 03/10/2020 CLINICAL DATA:  Small-bowel obstruction. EXAM: ABDOMEN - 2 VIEW COMPARISON:  03/09/2020.  CT 03/06/2020. FINDINGS: Surgical clips right upper quadrant. Persistent prominent small bowel distention noted. Prior colectomy. No free air. Degenerative changes lumbar spine and both hips. IMPRESSION: Persistent prominent small bowel distention. Findings consistent with persistent small bowel obstruction. Prior colectomy. No free air. Electronically Signed   By: Marcello Moores  Register   On: 03/10/2020 05:37   DG Abd 2 Views  Result Date: 03/07/2020 CLINICAL DATA:  Acute generalized abdominal pain. EXAM: ABDOMEN - 2 VIEW  COMPARISON:  April 08, 2019.  March 06, 2020. FINDINGS: Moderately dilated small bowel loops are noted concerning for distal small bowel obstruction. No definite free air is noted. No colonic dilatation is noted. Status post cholecystectomy. No abnormal calcifications are noted. IMPRESSION: Moderately dilated small bowel loops are noted concerning for distal small bowel obstruction. Electronically Signed   By: Marijo Conception M.D.   On: 03/07/2020 09:32    Barton Dubois, MD  [facility]  If 7PM-7AM, please contact night-coverage [email or web address] Password [email or web address] 03/13/2020, 5:14 PM   LOS: 7 days

## 2020-03-13 NOTE — Progress Notes (Signed)
RN called due to patient having increasing bowel distention and bilious vomiting.  Patient was admitted partial small bowel obstruction.  Patient refused NG tube insertion (states that NG tube was placed under specialist care due to his nasal anatomy the last time this was placed) Patient will be placed n.p.o. at this time

## 2020-03-14 ENCOUNTER — Inpatient Hospital Stay (HOSPITAL_COMMUNITY): Payer: Commercial Managed Care - PPO

## 2020-03-14 DIAGNOSIS — K56600 Partial intestinal obstruction, unspecified as to cause: Secondary | ICD-10-CM

## 2020-03-14 DIAGNOSIS — R140 Abdominal distension (gaseous): Secondary | ICD-10-CM | POA: Diagnosis not present

## 2020-03-14 DIAGNOSIS — I2510 Atherosclerotic heart disease of native coronary artery without angina pectoris: Secondary | ICD-10-CM | POA: Diagnosis not present

## 2020-03-14 DIAGNOSIS — R1030 Lower abdominal pain, unspecified: Secondary | ICD-10-CM | POA: Diagnosis not present

## 2020-03-14 LAB — COMPREHENSIVE METABOLIC PANEL
ALT: 18 U/L (ref 0–44)
AST: 30 U/L (ref 15–41)
Albumin: 3 g/dL — ABNORMAL LOW (ref 3.5–5.0)
Alkaline Phosphatase: 49 U/L (ref 38–126)
Anion gap: 8 (ref 5–15)
BUN: 13 mg/dL (ref 8–23)
CO2: 27 mmol/L (ref 22–32)
Calcium: 8.5 mg/dL — ABNORMAL LOW (ref 8.9–10.3)
Chloride: 98 mmol/L (ref 98–111)
Creatinine, Ser: 0.57 mg/dL — ABNORMAL LOW (ref 0.61–1.24)
GFR, Estimated: >60 mL/min (ref >60)
Glucose, Bld: 162 mg/dL — ABNORMAL HIGH (ref 70–99)
Potassium: 4.4 mmol/L (ref 3.5–5.1)
Sodium: 133 mmol/L — ABNORMAL LOW (ref 135–145)
Total Bilirubin: 1.4 mg/dL — ABNORMAL HIGH (ref 0.3–1.2)
Total Protein: 6.2 g/dL — ABNORMAL LOW (ref 6.5–8.1)

## 2020-03-14 LAB — CBC
HCT: 49.6 % (ref 39.0–52.0)
Hemoglobin: 17.3 g/dL — ABNORMAL HIGH (ref 13.0–17.0)
MCH: 31.7 pg (ref 26.0–34.0)
MCHC: 34.9 g/dL (ref 30.0–36.0)
MCV: 90.8 fL (ref 80.0–100.0)
Platelets: 231 10*3/uL (ref 150–400)
RBC: 5.46 MIL/uL (ref 4.22–5.81)
RDW: 11.8 % (ref 11.5–15.5)
WBC: 17.9 10*3/uL — ABNORMAL HIGH (ref 4.0–10.5)
nRBC: 0 % (ref 0.0–0.2)

## 2020-03-14 LAB — GLUCOSE, CAPILLARY
Glucose-Capillary: 178 mg/dL — ABNORMAL HIGH (ref 70–99)
Glucose-Capillary: 157 mg/dL — ABNORMAL HIGH (ref 70–99)
Glucose-Capillary: 143 mg/dL — ABNORMAL HIGH (ref 70–99)
Glucose-Capillary: 171 mg/dL — ABNORMAL HIGH (ref 70–99)
Glucose-Capillary: 175 mg/dL — ABNORMAL HIGH (ref 70–99)

## 2020-03-14 LAB — C-REACTIVE PROTEIN: CRP: 6.7 mg/dL — ABNORMAL HIGH (ref <1.0)

## 2020-03-14 LAB — D-DIMER, QUANTITATIVE: D-Dimer, Quant: 0.79 ug/mL-FEU — ABNORMAL HIGH (ref 0.00–0.50)

## 2020-03-14 LAB — FERRITIN: Ferritin: 1102 ng/mL — ABNORMAL HIGH (ref 24–336)

## 2020-03-14 MED ORDER — IOHEXOL 9 MG/ML PO SOLN
ORAL | Status: AC
  Filled 2020-03-14: qty 1000

## 2020-03-14 NOTE — Progress Notes (Signed)
PROGRESS NOTE  Calvin Cruz [id number] DOB: 01-18-1961 DOA: 03/04/2020 PCP: Reynold Bowen, MD  Brief History:  60 year old male with a history ofsecondarypolycythemia(JAK2 neg), hyperlipidemia, diabetes mellitus type 2, hypertension, coronary artery disease status post NSTEMI with DES to the circumflex June 2016, ulcerative colitis status post proctocolectomy with ileoanal anastomosis, and recurrent episodes of pouchitis presenting with upper abdominal pain, nausea, vomiting, and loose stools that began on 03/04/2020. He states that he normally has 5-6 loose bowel movements on average day secondary to his ulcerative colitis. He normally uses Imodium, usually 1 to 2 tablets on a daily basis. His last bowel movement was on 02/26/2020. He denies any fevers, chills, chest pain, shortness of breath, cough, hemoptysis. He denies any headache, sore throat, hemoptysis. He has had numerous episodes of emesis on 03/18/2020 resulting in dry heaving. There is no hematemesis. He denies hematochezia or melena.He has not had a bowel movement since the afternoon of 03/12/2020. In the emergency department, the patient was afebrile hemodynamically stable with oxygen saturation 94-96% on room air. BMP showed a sodium 135, potassium 4.1, BUN 14, serum creatinine 0.81. LFTs were unremarkable. Lipase 21. WBC 7.1, hemoglobin 17.7, platelets 174,000. CT of the abdomen and pelvis showed mildly dilated air-fluid filled loops of ileum up to 3.2 cm. There was no clear transition point or focal narrowing. There is no surrounding inflammatory changes. He is status post colectomy with ileal colonic anastomosis.  Assessment/Plan: Partial small bowel obstruction -continue judicious use of narcotics; goal is not for pain free; but to assist tolerating symptoms.   -Continue the use of levsin as needed . -Continue PRN antiemetics.  -Per GI recommendations will use simethicone -Ice chips and sips with  medications. -Soft/nondistended or tender abdomen. -Holding on further advancing diet based on current situation -CT abdomen has been ordered  Hypokalemia -In the setting of GI losses -Continue to follow electrolytes trend and further replete as needed -Receiving maintenance fluids with potassium at this time.  COVID-19 gastroenteritis/Infection -Patient was found to be incidentally COVID-19 positive by RT-PCR -pt's symptoms with minimal pulm symptoms of < 5 days duration with "high risk" criteria-currently using 2-3 L nasal cannula supplementation intermittently. -With changes in inflammatory markers, fever and also presence of tachypnea will initiated on steroids and remdesivir.  Day 3 out of 5. -Will continue antiemetics as needed; Reglan added for refractory symptoms if required.. -Hypoactive bowel sounds appreciated on exam; patient's abdomen soft and mildly tender. -Stable electrolytes appreciated today. -continue PPI. -Patient has not been vaccinated -Appreciate GI consultation; simethicone to assist with over tympanic and obstipation symptoms.  -CRP--3.3>>2.0>>17>>15>>2.6 -Ferritin 838. -Continue to follow inflammatory markers and close monitoring of patient renal function and LFTs.. -My ended requiring a repeat CT abdomen in the next 24-30 hours based on clinical improvement. -Okay for ice chips and sips with medications. -Repeat chest x-ray 03/14/2020 demonstrating diffuse bilateral infiltrates/opacities consistent with Covid infection.  Coronary artery disease with history of NSTEMI -No chest pain presently -Restart Brilinta when able to fully  tolerate p.o. -Patient follows with Dr. Glenetta Hew -Beta-blocker has been resumed.  Diabetes mellitus type 2, controlled -Restart Jardiance once able to tolerate p.o. -Continue sliding scale insulin while inpatient. -03/07/20 A1C--6.7 -Treatment for COVID infection using the steroids has been started; CBGs expected to get  elevated -Will add low-dose Lantus to assist with management.  Hyperlipidemia -Restart Crestor and Vascepa once able to tolerate p.o.  Pituitary Tumor  -Found in 08/28/16 Brain  MRI  -Will continue outpatient follow up with Dr. Forde Dandy.   Status is: Inpatient  Remains inpatient appropriate because: IV treatments appropriate due to intensity of illness or inability to take PO   Dispo: The patient is from:Home Anticipated d/c is [id number] Anticipated d/c date is: 2-3 days Patient currently is not medically stable to d/c.     Family Communication:spouse updated 03/10/20  Consultants:GI, general surgery  Code Status: FULL  DVT Prophylaxis: [place] Lovenox   Procedures: As Listed in Progress Note Above  Antibiotics: None  Subjective: Worsening respiratory status requiring 25 L heated high flow and nonrebreather.  Afebrile currently expressing no vomiting.  Increased abdominal distention and mild tenderness reported.  Patient having difficulty speaking in full sentences and very short winded with activity.  Objective: Vitals:   03/14/20 1119 03/14/20 1200 03/14/20 1300 03/14/20 1308  BP:  (!) 162/82 (!) 147/70   Pulse: 78 83 83   Resp: (!) 28 (!) 34 (!) 33   Temp: 98.8 F (37.1 C)     TempSrc: Oral     SpO2: 95% 91% 90% 91%  Weight:      Height:        Intake/Output Summary (Last 24 hours) at 03/14/2020 1412 Last data filed at 03/14/2020 [id number] Gross per 24 hour  Intake 1938.55 ml  Output 701 ml  Net 1237.55 ml   Weight change: -1.4 kg  Exam: General exam: Alert, awake, oriented x 3; reported worsening respiratory status, short winded sensation can increase in oxygen supplementation.  Right now patient is requiring 25 L of heated high flow and 15 L on nonrebreather mask. Respiratory system: No crackles, no wheezing on exam. Positive tachypnea difficulty speaking in full sentences. Cardiovascular  system:RRR. No murmurs, rubs, gallops.  No JVD. Gastrointestinal system: Abdomen is distended, tympanic and without guarding; hypoactive bowel sounds appreciated on examination.  Patient reports 2 liquid bowel movements overnight.   Central nervous system: Alert and oriented. No focal neurological deficits. Extremities: No cyanosis or clubbing. Skin: No rashes, no petechiae. Psychiatry: Judgement and insight appear normal. Mood & affect appropriate.    Data Reviewed: I have personally reviewed following labs and imaging studies  Basic Metabolic Panel: Recent Labs  Lab 02/21/2020 0436 03/09/20 0558 03/10/20 0326 03/11/20 0443 03/13/20 0421 03/14/20 0812  NA 138 138 134* 138 137 133*  K 3.6 3.8 3.0* 3.6 4.1 4.4  CL 96* 96* 93* 96* 101 98  CO2 30 31 28 29 27 27   GLUCOSE 183* 203* 205* 218* 238* 162*  BUN 17 14 25* 27* 15 13  CREATININE 0.66 0.75 1.07 0.87 0.64 0.57*  CALCIUM 9.1 8.9 9.1 8.9 8.6* 8.5*  MG 1.8 1.7 1.8 2.1  --   --   PHOS 2.8 3.1 4.2 2.5  --   --    Liver Function Tests: Recent Labs  Lab 03/09/20 0558 03/10/20 0326 03/11/20 0443 03/13/20 0421 03/14/20 0812  AST 23 35 31 25 30   ALT 19 24 25 20 18   ALKPHOS 37* 41 38 36* 49  BILITOT 0.8 0.6 0.6 1.0 1.4*  PROT 6.4* 7.3 7.0 5.9* 6.2*  ALBUMIN 3.7 4.1 3.6 3.1* 3.0*   CBC: Recent Labs  Lab 03/02/2020 0436 03/09/20 0558 03/10/20 0326 03/11/20 0443 03/13/20 0421 03/14/20 0812  WBC 4.1 3.0* 4.5 6.9 13.8* 17.9*  NEUTROABS 2.6 2.1 3.3 5.3  --   --   HGB 16.4 16.5 17.4* 17.0 16.3 17.3*  HCT 48.3 47.6 49.4 48.6 46.4 49.6  MCV 92.0  90.8 89.2 90.5 91.5 90.8  PLT 164 139* 156 151 205 231   CBG: Recent Labs  Lab 03/13/20 1941 03/13/20 2333 03/14/20 0340 03/14/20 0725 03/14/20 1118  GLUCAP 209* 161* 178* 157* 143*   HbA1C: No results for input(s): HGBA1C in the last 72 hours.   Urine analysis:    Component Value Date/Time   COLORURINE YELLOW 04/04/2019 1623   APPEARANCEUR CLEAR 04/04/2019 1623    LABSPEC 1.038 (H) 04/04/2019 1623   PHURINE 5.0 04/04/2019 1623   GLUCOSEU >=500 (A) 04/04/2019 1623   HGBUR NEGATIVE 04/04/2019 1623   BILIRUBINUR NEGATIVE 04/04/2019 1623   KETONESUR 20 (A) 04/04/2019 1623   PROTEINUR NEGATIVE 04/04/2019 1623   NITRITE NEGATIVE 04/04/2019 1623   LEUKOCYTESUR NEGATIVE 04/04/2019 1623   Sepsis Labs:  Recent Results (from the past 240 hour(s))  Resp Panel by RT-PCR (Flu A&B, Covid) Nasopharyngeal Swab     Status: Abnormal   Collection Time: 03/14/2020 10:28 PM   Specimen: Nasopharyngeal Swab; Nasopharyngeal(NP) swabs in vial transport medium  Result Value Ref Range Status   SARS Coronavirus 2 by RT PCR POSITIVE (A) NEGATIVE Final    Comment: RESULT CALLED TO, READ BACK BY AND VERIFIED WITH: M BRAIN AT 0018 ON 03/06/2020 BY JPM (NOTE) SARS-CoV-2 target nucleic acids are DETECTED.  The SARS-CoV-2 RNA is generally detectable in upper respiratory specimens during the acute phase of infection. Positive results are indicative of the presence of the identified virus, but do not rule out bacterial infection or co-infection with other pathogens not detected by the test. Clinical correlation with patient history and other diagnostic information is necessary to determine patient infection status. The expected result is Negative.  Fact Sheet for Patients: EntrepreneurPulse.com.au  Fact Sheet for Healthcare Providers: IncredibleEmployment.be  This test is not yet approved or cleared by the [place] FDA and  has been authorized for detection and/or diagnosis of SARS-CoV-2 by FDA under an Emergency Use Authorization (EUA).  This EUA will remain in effect (meaning this test can  be used) for the duration of  the COVID-19 declaration under Section 564(b)(1) of the Act, 21 U.S.C. section 360bbb-3(b)(1), unless the authorization is terminated or revoked sooner.     Influenza A by PCR NEGATIVE NEGATIVE Final   Influenza  B by PCR NEGATIVE NEGATIVE Final    Comment: (NOTE) The Xpert Xpress SARS-CoV-2/FLU/RSV plus assay is intended as an aid in the diagnosis of influenza from Nasopharyngeal swab specimens and should not be used as a sole basis for treatment. Nasal washings and aspirates are unacceptable for Xpert Xpress SARS-CoV-2/FLU/RSV testing.  Fact Sheet for Patients: EntrepreneurPulse.com.au  Fact Sheet for Healthcare Providers: IncredibleEmployment.be  This test is not yet approved or cleared by the [place] FDA and has been authorized for detection and/or diagnosis of SARS-CoV-2 by FDA under an Emergency Use Authorization (EUA). This EUA will remain in effect (meaning this test can be used) for the duration of the COVID-19 declaration under Section 564(b)(1) of the Act, 21 U.S.C. section 360bbb-3(b)(1), unless the authorization is terminated or revoked.  Performed at [facility], [address], [place], [place] [address]   MRSA PCR Screening     Status: None   Collection Time: 03/10/20 12:47 AM   Specimen: Nasal Mucosa; Nasopharyngeal  Result Value Ref Range Status   MRSA by PCR NEGATIVE NEGATIVE Final    Comment:        The GeneXpert MRSA Assay (FDA approved for NASAL specimens only), is one component of a comprehensive  MRSA colonization surveillance program. It is not intended to diagnose MRSA infection nor to guide or monitor treatment for MRSA infections. Performed at [facility], [address] [place], [place] [address]      Scheduled Meds: . Chlorhexidine Gluconate Cloth  6 each Topical Daily  . enoxaparin (LOVENOX) injection  40 mg Subcutaneous Q24H  . insulin aspart  0-15 Units Subcutaneous Q4H  . insulin glargine  12 Units Subcutaneous QHS  . iohexol      . Ipratropium-Albuterol  1 puff Inhalation TID  . methylPREDNISolone (SOLU-MEDROL) injection  60 mg Intravenous Q12H  . metoprolol tartrate  2.5 mg Intravenous Q8H  .  ondansetron (ZOFRAN) IV  4 mg Intravenous Q6H  . pantoprazole (PROTONIX) IV  40 mg Intravenous Q12H   Continuous Infusions: . 0.9 % NaCl with KCl 20 mEq / L 100 mL/hr at 03/14/20 6712    Procedures/Studies: DG Abd 1 View  Result Date: 03/09/2020 CLINICAL DATA:  Abdominal distension EXAM: ABDOMEN - 1 VIEW COMPARISON:  03/07/2020 FINDINGS: Air-filled, dilated loops of small bowel are again identified. Degree of distention is similar but there are increased dilated loops. No definite free air on this supine view. Colon appears to be decompressed. IMPRESSION: Similar degree of small-bowel distention with increased number of dilated loops visualized. Electronically Signed   By: Macy Mis M.D.   On: 03/09/2020 08:08   CT ABDOMEN PELVIS W CONTRAST  Result Date: 03/06/2020 CLINICAL DATA:  Abdominal pain EXAM: CT ABDOMEN AND PELVIS WITH CONTRAST TECHNIQUE: Multidetector CT imaging of the abdomen and pelvis was performed using the standard protocol following bolus administration of intravenous contrast. CONTRAST:  170mL OMNIPAQUE IOHEXOL 300 MG/ML  SOLN COMPARISON:  April 04, 2019 FINDINGS: Lower chest: The visualized heart size within normal limits. No pericardial fluid/thickening. No hiatal hernia. The visualized portions of the lungs are clear. Hepatobiliary: The liver is normal in density without focal abnormality.The main portal vein is patent. The patient is status post cholecystectomy. No biliary ductal dilation. Pancreas: Unremarkable. No pancreatic ductal dilatation or surrounding inflammatory changes. Spleen: Normal in size without focal abnormality. Adrenals/Urinary Tract: Both adrenal glands appear normal. The kidneys and collecting system appear normal without evidence of urinary tract calculus or hydronephrosis. Bladder is unremarkable. Stomach/Bowel: The stomach and proximal small bowel are unremarkable. There are mildly dilated air and fluid-filled loops of ileum measuring up to 3.2 cm.  No clear transition point or focal wall narrowing is noted. No surrounding inflammatory changes are seen. There is been a prior colectomy with an ileocolonic anastomosis within the deep pelvis. Vascular/Lymphatic: There are no enlarged mesenteric, retroperitoneal, or pelvic lymph nodes. Scattered aortic atherosclerotic calcifications are seen without aneurysmal dilatation. Reproductive: The prostate is unremarkable. Other: No evidence of abdominal wall mass or hernia. Musculoskeletal: No acute or significant osseous findings. IMPRESSION: Mildly dilated fluid and air-filled ileal loops without a clear transition point. This is likely due to a partial small bowel obstruction. Prior colectomy with anastomosis within the deep pelvis. Aortic Atherosclerosis (ICD10-I70.0). Electronically Signed   By: Prudencio Pair M.D.   On: 03/06/2020 00:46   DG CHEST PORT 1 VIEW  Result Date: 03/14/2020 CLINICAL DATA:  COVID positive.  Short of breath. EXAM: PORTABLE CHEST 1 VIEW COMPARISON:  04/19/2015 FINDINGS: Extensive diffuse bilateral airspace disease, involving the upper and lower lobes. No effusion or collapse. No pneumothorax. Heart size normal. IMPRESSION: Extensive diffuse bilateral airspace disease compatible with COVID pneumonia. Electronically Signed   By: Franchot Gallo M.D.   On:  03/14/2020 11:21   DG Abd 2 Views  Result Date: 03/10/2020 CLINICAL DATA:  Small-bowel obstruction. EXAM: ABDOMEN - 2 VIEW COMPARISON:  03/09/2020.  CT 03/06/2020. FINDINGS: Surgical clips right upper quadrant. Persistent prominent small bowel distention noted. Prior colectomy. No free air. Degenerative changes lumbar spine and both hips. IMPRESSION: Persistent prominent small bowel distention. Findings consistent with persistent small bowel obstruction. Prior colectomy. No free air. Electronically Signed   By: Marcello Moores  Register   On: 03/10/2020 05:37   DG Abd 2 Views  Result Date: 03/07/2020 CLINICAL DATA:  Acute generalized abdominal  pain. EXAM: ABDOMEN - 2 VIEW COMPARISON:  April 08, 2019.  March 06, 2020. FINDINGS: Moderately dilated small bowel loops are noted concerning for distal small bowel obstruction. No definite free air is noted. No colonic dilatation is noted. Status post cholecystectomy. No abnormal calcifications are noted. IMPRESSION: Moderately dilated small bowel loops are noted concerning for distal small bowel obstruction. Electronically Signed   By: Marijo Conception M.D.   On: 03/07/2020 09:32    Barton Dubois, MD  [facility]  If 7PM-7AM, please contact night-coverage [email or web address] Password [email or web address] 03/14/2020, 2:12 PM   LOS: 8 days

## 2020-03-14 NOTE — Progress Notes (Signed)
Patient still remains tachypnic in the mid 51s and requires HHFNC and 15L NRB.  Pt has had 2 BMs this shift, but abdomen remains distended.  Patient still declining NGT insertion when given as an option to relieve abdominal distention allowing greater lung expansion.  Refuses to attempt side lying.  Expiratory wheezes noted throughout RUL and LUL. Diminished breath sounds appreciated throughout RML, LLL, and RLL.  R side more diminished than L.  Patient unable to participate via deep breathing to allow better pulmonary assessment.  Persists with dry cough.

## 2020-03-14 NOTE — Progress Notes (Addendum)
Patient c/o "being unable to catch air".  Upon assessment, pt has tachypnea (RR 30s to mid 40s) with oxygen saturation in the upper 70s to low 80s.  Oxygen increased from 15L NRB to 15L NRB + 15L HFNC. Oxygen saturation increased to 86-92%, but RR remains 35-45.  HOB raised to 40 degrees.    Respiratory therapy made aware of changes and will switch patient to [facility] over BiPAP d/t abdominal distention and NGT refusal with risk for vomiting/aspiration on BiPAP.  Dr. Olevia Bowens notified.

## 2020-03-14 NOTE — Progress Notes (Signed)
Subjective: Feels short of breath. Oxygen requirements have increased. 25 liters heated high flow and 15 liters non-rebreather. Denies abdominal pain. Notes nausea but no vomiting. Does not want an NG tube attempt at the bedside, stating he has deviated septum. Recently passed flatus. No BM in 2 days.    Objective: Vital signs in last 24 hours: Temp:  [96.7 F (35.9 C)-98.8 F (37.1 C)] 98.8 F (37.1 C) (01/24 1119) Pulse Rate:  [73-87] 78 (01/24 1119) Resp:  [26-43] 28 (01/24 1119) BP: (130-163)/(42-102) 130/42 (01/24 1100) SpO2:  [77 %-96 %] 95 % (01/24 1119) FiO2 (%):  [100 %] 100 % (01/24 1025) Weight:  [97.9 kg] 97.9 kg (01/24 0348) Last BM Date: 03/12/20 General:   Alert and oriented, acutely ill-appearing, uncomfortable Head:  Normocephalic and atraumatic. Neck:  Supple, without thyromegaly or masses.  Heart:  S1, S2 present, no murmurs noted.  Lungs: coarse, diminished bases, tachypneic with shallow breaths Abdomen:  Distended and full but non-tense. Soft.  tympanitic bowel sounds, no tenderness to palpation Extremities:  Without  edema. Neurologic:  Alert and  oriented x4   Intake/Output from previous day: 01/23 0701 - 01/24 0700 In: 2146.9 [P.O.:240; I.V.:1766.7; IV Piggyback:140.2] Out: 1 [Urine:1] Intake/Output this shift: Total I/O In: 823.8 [I.V.:823.8] Out: 700 [Urine:700]  Lab Results: Recent Labs    03/13/20 0421 03/14/20 0812  WBC 13.8* 17.9*  HGB 16.3 17.3*  HCT 46.4 49.6  PLT 205 231   BMET Recent Labs    03/13/20 0421 03/14/20 0812  NA 137 133*  K 4.1 4.4  CL 101 98  CO2 27 27  GLUCOSE 238* 162*  BUN 15 13  CREATININE 0.64 0.57*  CALCIUM 8.6* 8.5*   LFT Recent Labs    03/13/20 0421 03/14/20 0812  PROT 5.9* 6.2*  ALBUMIN 3.1* 3.0*  AST 25 30  ALT 20 18  ALKPHOS 36* 49  BILITOT 1.0 1.4*    Studies/Results: DG CHEST PORT 1 VIEW  Result Date: 03/14/2020 CLINICAL DATA:  COVID positive.  Short of breath. EXAM: PORTABLE  CHEST 1 VIEW COMPARISON:  04/19/2015 FINDINGS: Extensive diffuse bilateral airspace disease, involving the upper and lower lobes. No effusion or collapse. No pneumothorax. Heart size normal. IMPRESSION: Extensive diffuse bilateral airspace disease compatible with COVID pneumonia. Electronically Signed   By: Franchot Gallo M.D.   On: 03/14/2020 11:21    Assessment: 60 year old male with history of fulminant ulcerative pancolitis s/p IPAA in 2003 presenting with recurrent SBO but no obvious transition point, found to be COVID positive and now with worsening respiratory status and need for increased oxygen and CXR today with COVID pneumonia. He has failed to improve with small bowel obstruction and supportive measures. NG tube placed endoscopically by GI on 1/18 but somehow dislodged later that evening. Currently, he is without any NG tube for decompression. Surgery following peripherally.  Abdomen is distended but soft; he did report passing flatus once this morning. Persistent nausea but no vomiting. He is hesitant to have NG tube placed if needed again. Agree with CT abd/pelvis today once stable from a respiratory standpoint. SBO likely related to known adhesive disease and has history of SBOs in the past. Await CT findings prior to considering examination of pouch and anastomosis; respiratory status precluding this at the moment regardless.   Elevated bilirubin: suspect Gilbert's. Non-specific mildly elevated Tbili intermittently.    Plan: Await CT findings when patient stable from respiratory standpoint If respiratory status does not improve for CT, recommend abdominal  xray Covid treatment per attending May need to reach out to Surgery again pending results of imaging   Annitta Needs, PhD, ANP-BC [facility]     LOS: 8 days    03/14/2020, 11:37 AM

## 2020-03-14 NOTE — Progress Notes (Signed)
Pt was transferred from bed to chair by nurse tech at 0900. Pt called out twice to get back in bed within the hour and was educated that he needed to stay in chair to promote lung expansion and work of breathing. At 1000 I noticed the patient's oxygen saturation was not reading from the monitor at the nurses station and upon looking into the patients room he had gotten up from the chair and into the bed himself and was struggling to breathe with oxygen saturation of 80% and rate of 43. There was no harm to the patient and I assisted in getting him settled in the bed. His oxygen requirements were increased to 25 liters Heated High Flow and 15 liters Non-rebreather. He was educated on safety and the importance of compliance. Dr. Dyann Kief and GI to bedside and discussed patient condition and plan of care. Patient is currently lying in bed on his side to rest and we will attempt to move to chair later this afternoon.

## 2020-03-14 NOTE — Progress Notes (Signed)
[facility] night shift.  The staff reported that the patient had increased work of breathing earlier while he was on 15 L of oxygen via NRB.  RT added HFNC at 15 LPM in addition to NRB oxygen.  He is distended and declined NG tube insertion.  This prevents him from doing prone positioning.  I went to evaluate him, but the patient has improved with the new oxygen supplementation, was sleeping and had normal vital signs, except for respiratory rate in the high 20s and low 30s.  We will continue to monitor him closely, since the patient remains at high risk for deterioration and endotracheal intubation.  Tennis Must, MD.

## 2020-03-15 DIAGNOSIS — I10 Essential (primary) hypertension: Secondary | ICD-10-CM | POA: Diagnosis not present

## 2020-03-15 DIAGNOSIS — R140 Abdominal distension (gaseous): Secondary | ICD-10-CM | POA: Diagnosis not present

## 2020-03-15 DIAGNOSIS — U071 COVID-19: Secondary | ICD-10-CM | POA: Diagnosis not present

## 2020-03-15 DIAGNOSIS — K56600 Partial intestinal obstruction, unspecified as to cause: Secondary | ICD-10-CM | POA: Diagnosis not present

## 2020-03-15 DIAGNOSIS — R1030 Lower abdominal pain, unspecified: Secondary | ICD-10-CM | POA: Diagnosis not present

## 2020-03-15 DIAGNOSIS — I2510 Atherosclerotic heart disease of native coronary artery without angina pectoris: Secondary | ICD-10-CM | POA: Diagnosis not present

## 2020-03-15 LAB — GLUCOSE, CAPILLARY
Glucose-Capillary: 144 mg/dL — ABNORMAL HIGH (ref 70–99)
Glucose-Capillary: 146 mg/dL — ABNORMAL HIGH (ref 70–99)
Glucose-Capillary: 171 mg/dL — ABNORMAL HIGH (ref 70–99)
Glucose-Capillary: 176 mg/dL — ABNORMAL HIGH (ref 70–99)
Glucose-Capillary: 199 mg/dL — ABNORMAL HIGH (ref 70–99)
Glucose-Capillary: 214 mg/dL — ABNORMAL HIGH (ref 70–99)

## 2020-03-15 LAB — COMPREHENSIVE METABOLIC PANEL
ALT: 17 U/L (ref 0–44)
AST: 29 U/L (ref 15–41)
Albumin: 2.8 g/dL — ABNORMAL LOW (ref 3.5–5.0)
Alkaline Phosphatase: 52 U/L (ref 38–126)
Anion gap: 10 (ref 5–15)
BUN: 13 mg/dL (ref 8–23)
CO2: 25 mmol/L (ref 22–32)
Calcium: 8.3 mg/dL — ABNORMAL LOW (ref 8.9–10.3)
Chloride: 96 mmol/L — ABNORMAL LOW (ref 98–111)
Creatinine, Ser: 0.51 mg/dL — ABNORMAL LOW (ref 0.61–1.24)
GFR, Estimated: >60 mL/min (ref >60)
Glucose, Bld: 165 mg/dL — ABNORMAL HIGH (ref 70–99)
Potassium: 4.5 mmol/L (ref 3.5–5.1)
Sodium: 131 mmol/L — ABNORMAL LOW (ref 135–145)
Total Bilirubin: 1.6 mg/dL — ABNORMAL HIGH (ref 0.3–1.2)
Total Protein: 5.9 g/dL — ABNORMAL LOW (ref 6.5–8.1)

## 2020-03-15 LAB — CBC
HCT: 46.6 % (ref 39.0–52.0)
Hemoglobin: 16.6 g/dL (ref 13.0–17.0)
MCH: 31.4 pg (ref 26.0–34.0)
MCHC: 35.6 g/dL (ref 30.0–36.0)
MCV: 88.3 fL (ref 80.0–100.0)
Platelets: 227 10*3/uL (ref 150–400)
RBC: 5.28 MIL/uL (ref 4.22–5.81)
RDW: 11.3 % — ABNORMAL LOW (ref 11.5–15.5)
WBC: 13.6 10*3/uL — ABNORMAL HIGH (ref 4.0–10.5)
nRBC: 0 % (ref 0.0–0.2)

## 2020-03-15 LAB — C-REACTIVE PROTEIN: CRP: 10.3 mg/dL — ABNORMAL HIGH (ref <1.0)

## 2020-03-15 LAB — D-DIMER, QUANTITATIVE: D-Dimer, Quant: 1.51 ug/mL-FEU — ABNORMAL HIGH (ref 0.00–0.50)

## 2020-03-15 LAB — FERRITIN: Ferritin: 1256 ng/mL — ABNORMAL HIGH (ref 24–336)

## 2020-03-15 NOTE — Progress Notes (Signed)
Encouraged pt to self prone, lay on side, or get up to chair. Pt refused. Encouraged use of IS as well.

## 2020-03-15 NOTE — Progress Notes (Signed)
Subjective:  Patient states he is not feeling well.  He complains of shortness of breath.  He denies coughing or chest pain.  He complains of back pain as well as abdominal pain.  He says presently he is not having any abdominal pain.  He has been passing flatus since yesterday.  He has had liquid stools.  He is hungry.  According to nursing staff he has been taking fentanyl almost every 3 hours.  Current Medications:  Current Facility-Administered Medications:  .  0.9 % NaCl with KCl 20 mEq/ L  infusion, , Intravenous, Continuous, Madera, Carlos, MD, Last Rate: 100 mL/hr at 03/15/20 0915, Infusion Verify at 03/15/20 0915 .  acetaminophen (TYLENOL) tablet 650 mg, 650 mg, Oral, Q6H PRN, Madera, Carlos, MD .  Chlorhexidine Gluconate Cloth 2 % PADS 6 each, 6 each, Topical, Daily, Madera, Carlos, MD, 6 each at 03/14/20 1002 .  enoxaparin (LOVENOX) injection 40 mg, 40 mg, Subcutaneous, Q24H, Madera, Carlos, MD, 40 mg at 03/14/20 2145 .  fentaNYL (SUBLIMAZE) injection 100 mcg, 100 mcg, Intravenous, Q3H PRN, Madera, Carlos, MD, 100 mcg at 03/15/20 0717 .  hyoscyamine (LEVSIN) 0.5 MG/ML injection 0.5 mg, 0.5 mg, Intravenous, Q8H PRN, Madera, Carlos, MD, 0.5 mg at 03/13/20 0929 .  insulin aspart (novoLOG) injection 0-15 Units, 0-15 Units, Subcutaneous, Q4H, Madera, Carlos, MD, 3 Units at 03/15/20 0717 .  insulin glargine (LANTUS) injection 12 Units, 12 Units, Subcutaneous, QHS, Madera, Carlos, MD, 12 Units at 03/14/20 2146 .  Ipratropium-Albuterol (COMBIVENT) respimat 1 puff, 1 puff, Inhalation, TID, Madera, Carlos, MD, 1 puff at 03/15/20 0732 .  methylPREDNISolone sodium succinate (SOLU-MEDROL) 125 mg/2 mL injection 60 mg, 60 mg, Intravenous, Q12H, Madera, Carlos, MD, 60 mg at 03/14/20 2200 .  metoCLOPramide (REGLAN) injection 10 mg, 10 mg, Intravenous, Q8H PRN, Madera, Carlos, MD, 10 mg at 03/10/20 1444 .  metoprolol tartrate (LOPRESSOR) injection 2.5 mg, 2.5 mg, Intravenous, Q8H, Madera, Carlos, MD,  2.5 mg at 03/15/20 0544 .  ondansetron (ZOFRAN) injection 4 mg, 4 mg, Intravenous, Q6H, Madera, Carlos, MD, 4 mg at 03/15/20 0544 .  pantoprazole (PROTONIX) injection 40 mg, 40 mg, Intravenous, Q12H, Madera, Carlos, MD, 40 mg at 03/14/20 2145 .  phenol (CHLORASEPTIC) mouth spray 1 spray, 1 spray, Mouth/Throat, PRN, Madera, Carlos, MD, 1 spray at 03/10/20 0430 .  promethazine (PHENERGAN) injection 12.5 mg, 12.5 mg, Intravenous, Q6H PRN, Madera, Carlos, MD, 12.5 mg at 03/13/20 0316   Objective: Blood pressure 139/70, pulse 78, temperature 97.6 F (36.4 C), temperature source Axillary, resp. rate (!) 25, height 6' 6" (1.981 m), weight 97.5 kg, SpO2 95 %. Patient is alert and tachycardic. He is receiving oxygen via mask. Cardiac exam with regular rhythm normal S1 and S2.  No murmur gallop noted Auscultation of the lungs reveal vesicular breath sounds.  No rales or rhonchi noted.  He does not take a deep breath Abdomen is less distended than when I saw him 2 days ago.  Bowel sounds are present.  On palpation it is soft and nontender.  No organomegaly or masses. No LE edema noted.  Labs/studies Results:  CBC Latest Ref Rng & Units 03/15/2020 03/14/2020 03/13/2020  WBC 4.0 - 10.5 K/uL 13.6(H) 17.9(H) 13.8(H)  Hemoglobin 13.0 - 17.0 g/dL 16.6 17.3(H) 16.3  Hematocrit 39.0 - 52.0 % 46.6 49.6 46.4  Platelets 150 - 400 K/uL 227 231 205    CMP Latest Ref Rng & Units 03/15/2020 03/14/2020 03/13/2020  Glucose 70 - 99 mg/dL 165(H) 162(H) 238(H)  BUN 8 -   23 mg/dL 13 13 15  Creatinine 0.61 - 1.24 mg/dL 0.51(L) 0.57(L) 0.64  Sodium 135 - 145 mmol/L 131(L) 133(L) 137  Potassium 3.5 - 5.1 mmol/L 4.5 4.4 4.1  Chloride 98 - 111 mmol/L 96(L) 98 101  CO2 22 - 32 mmol/L 25 27 27  Calcium 8.9 - 10.3 mg/dL 8.3(L) 8.5(L) 8.6(L)  Total Protein 6.5 - 8.1 g/dL 5.9(L) 6.2(L) 5.9(L)  Total Bilirubin 0.3 - 1.2 mg/dL 1.6(H) 1.4(H) 1.0  Alkaline Phos 38 - 126 U/L 52 49 36(L)  AST 15 - 41 U/L 29 30 25  ALT 0 - 44 U/L 17  18 20    Hepatic Function Latest Ref Rng & Units 03/15/2020 03/14/2020 03/13/2020  Total Protein 6.5 - 8.1 g/dL 5.9(L) 6.2(L) 5.9(L)  Albumin 3.5 - 5.0 g/dL 2.8(L) 3.0(L) 3.1(L)  AST 15 - 41 U/L 29 30 25  ALT 0 - 44 U/L 17 18 20  Alk Phosphatase 38 - 126 U/L 52 49 36(L)  Total Bilirubin 0.3 - 1.2 mg/dL 1.6(H) 1.4(H) 1.0  Bilirubin, Direct 0.00 - 0.40 mg/dL - - -    Lab Results  Component Value Date   CRP 10.3 (H) 03/15/2020     Assessment:  #1.  Small bowel obstruction most likely secondary to adhesions given prior history of extensive surgery for ulcerative colitis.  He is passing flatus and stools.  Abdominal distention is decreased.  He may also have an element of ileus given Covid infection and medications as well as bedrest.  He is tolerating clear liquids.  Will hold off abdominal CT given improvement in last 24 hours  #2.  Covid pneumonia.  He tested positive for Covid on admission on 03/15/2020.  He did not have any respiratory symptoms.  He has been on remdesivir for 4 days.  He is on oxygen at 80% and flow rate of 25 L/min.  #3.  Diabetes mellitus.  He is on sliding scale coverage as well as low-dose Lantus  Recommendations  Advance diet to full liquids. Once again patient advised to turn in bed as often as he can. I also recommended to the patient that he should use the fentanyl judiciously.      

## 2020-03-15 NOTE — Progress Notes (Signed)
PROGRESS NOTE  Calvin Cruz [id number] DOB: 11-Aug-1960 DOA: 02/22/2020 PCP: Reynold Bowen, MD  Brief History:  60 year old male with a history ofsecondarypolycythemia(JAK2 neg), hyperlipidemia, diabetes mellitus type 2, hypertension, coronary artery disease status post NSTEMI with DES to the circumflex June 2016, ulcerative colitis status post proctocolectomy with ileoanal anastomosis, and recurrent episodes of pouchitis presenting with upper abdominal pain, nausea, vomiting, and loose stools that began on 03/04/2020. He states that he normally has 5-6 loose bowel movements on average day secondary to his ulcerative colitis. He normally uses Imodium, usually 1 to 2 tablets on a daily basis. His last bowel movement was on 03/18/2020. He denies any fevers, chills, chest pain, shortness of breath, cough, hemoptysis. He denies any headache, sore throat, hemoptysis. He has had numerous episodes of emesis on 03/03/2020 resulting in dry heaving. There is no hematemesis. He denies hematochezia or melena.He has not had a bowel movement since the afternoon of 03/12/2020. In the emergency department, the patient was afebrile hemodynamically stable with oxygen saturation 94-96% on room air. BMP showed a sodium 135, potassium 4.1, BUN 14, serum creatinine 0.81. LFTs were unremarkable. Lipase 21. WBC 7.1, hemoglobin 17.7, platelets 174,000. CT of the abdomen and pelvis showed mildly dilated air-fluid filled loops of ileum up to 3.2 cm. There was no clear transition point or focal narrowing. There is no surrounding inflammatory changes. He is status post colectomy with ileal colonic anastomosis.  Assessment/Plan: Partial small bowel obstruction -continue judicious use of narcotics; goal is not for pain free; but to assist tolerating symptoms.   -Continue the use of levsin as needed . -Continue PRN antiemetics.  -Will continue to follow recommendations by GI service -Given:  Respiratory decompensation holding on CT abdomen -Patient also with improvement in his distention, having bowel sounds and had experienced multiple bowel movements overnight.  No nausea or vomiting currently. -Patient has been instructed to increase physical activity.  Hypokalemia -In the setting of GI losses -Continue to follow electrolytes trend and further replete as needed -Receiving maintenance fluids with potassium at this time.  Acute respiratory failure with hypoxia due to COVID-19 pneumonia; patient also with COVID-19 gastroenteritis/Infection -Patient was found to be incidentally COVID-19 positive by RT-PCR -pt's symptoms with minimal pulm symptoms of < 5 days duration with "high risk" criteria-currently using 25 L nasal cannula supplementation intermittently. -With continue to follow inflammatory markers, due to fever and also presence of tachypnea was initiated on steroids and remdesivir.  Day 4 out of 5. -Will continue antiemetics as needed; Reglan added for refractory symptoms if required.. -Hypoactive bowel sounds appreciated on exam; patient's abdomen soft and mildly tender. -Stable electrolytes appreciated today. -continue PPI. -Patient has not been vaccinated -Appreciate GI consultation; simethicone to assist with over tympanic and obstipation symptoms.  -CRP--3.3>>2.0>>17>>15>>2.6 -Continue to monitor renal function and LFTs. -Repeat chest x-ray on 03/14/2020 demonstrating diffuse bilateral infiltrates/opacities consistent with Covid infection. -Following family request, pulmonology service has been consulted to further evaluate patient's care.  Coronary artery disease with history of NSTEMI -No chest pain presently -Restart Brilinta when able to fully  tolerate p.o. -Patient follows with Dr. Glenetta Hew -Beta-blocker has been resumed.  Diabetes mellitus type 2, controlled -Restart Jardiance once able to tolerate p.o. -Continue sliding scale insulin while  inpatient. -03/07/20 A1C--6.7 -Treatment for COVID infection using the steroids has been started; CBGs expected to get elevated -Will add low-dose Lantus to assist with management.  Hyperlipidemia -Restart Crestor and Vascepa once  able to tolerate p.o.  Pituitary Tumor  -Found in 08/28/16 Brain MRI  -Will continue outpatient follow up with Dr. Forde Dandy.   Status is: Inpatient  Remains inpatient appropriate because: IV treatments appropriate due to intensity of illness or inability to take PO   Dispo: The patient is from:Home Anticipated d/c is OI:[facility]TIW Anticipated d/c date is: To be determined Patient currently is not medically stable to d/c.     Family Communication:spouse updated 03/10/20  Consultants:GI, general surgery  Code Status: FULL  DVT Prophylaxis: [place] Lovenox   Procedures: As Listed in Progress Note Above  Antibiotics: None  Subjective: Continue to have difficulty breathing requiring high levels of oxygen supplementation.  Positive bowel sounds, moving his bowels and expressing no nausea vomiting at this time.  Feels weak, deconditioned and complaining of general malaise.  Patient expressed pain in his neck and back from positioning  Objective: Vitals:   03/15/20 1324 03/15/20 1400 03/15/20 1500 03/15/20 1600  BP:  (!) 145/66 (!) 147/98 (!) 150/79  Pulse:  82 87 85  Resp:  (!) 25 (!) 37 (!) 28  Temp:      TempSrc:      SpO2: 91% 91% (!) 88% (!) 88%  Weight:      Height:        Intake/Output Summary (Last 24 hours) at 03/15/2020 1620 Last data filed at 03/15/2020 1602 Gross per 24 hour  Intake 2615.13 ml  Output 700 ml  Net 1915.13 ml   Weight change: -0.4 kg  Exam: General exam: Alert, awake, oriented x 3;  Respiratory system: Clear to auscultation. Respiratory effort normal. Cardiovascular system:RRR. No murmurs, rubs, gallops. Gastrointestinal system: Abdomen is nondistended,  soft and nontender. No organomegaly or masses felt. Normal bowel sounds heard. Central nervous system: Alert and oriented. No focal neurological deficits. Extremities: No C/C/E, +pedal pulses Skin: No rashes, lesions or ulcers Psychiatry: Judgement and insight appear normal. Mood & affect appropriate.    Data Reviewed: I have personally reviewed following labs and imaging studies  Basic Metabolic Panel: Recent Labs  Lab 03/09/20 0558 03/10/20 0326 03/11/20 0443 03/13/20 0421 03/14/20 0812 03/15/20 0346  NA 138 134* 138 137 133* 131*  K 3.8 3.0* 3.6 4.1 4.4 4.5  CL 96* 93* 96* 101 98 96*  CO2 31 28 29 27 27 25   GLUCOSE 203* 205* 218* 238* 162* 165*  BUN 14 25* 27* 15 13 13   CREATININE 0.75 1.07 0.87 0.64 0.57* 0.51*  CALCIUM 8.9 9.1 8.9 8.6* 8.5* 8.3*  MG 1.7 1.8 2.1  --   --   --   PHOS 3.1 4.2 2.5  --   --   --    Liver Function Tests: Recent Labs  Lab 03/10/20 0326 03/11/20 0443 03/13/20 0421 03/14/20 0812 03/15/20 0346  AST 35 31 25 30 29   ALT 24 25 20 18 17   ALKPHOS 41 38 36* 49 52  BILITOT 0.6 0.6 1.0 1.4* 1.6*  PROT 7.3 7.0 5.9* 6.2* 5.9*  ALBUMIN 4.1 3.6 3.1* 3.0* 2.8*   CBC: Recent Labs  Lab 03/09/20 0558 03/10/20 0326 03/11/20 0443 03/13/20 0421 03/14/20 0812 03/15/20 0346  WBC 3.0* 4.5 6.9 13.8* 17.9* 13.6*  NEUTROABS 2.1 3.3 5.3  --   --   --   HGB 16.5 17.4* 17.0 16.3 17.3* 16.6  HCT 47.6 49.4 48.6 46.4 49.6 46.6  MCV 90.8 89.2 90.5 91.5 90.8 88.3  PLT 139* 156 151 205 231 227   CBG: Recent Labs  Lab  03/14/20 2359 03/15/20 0412 03/15/20 0717 03/15/20 1137 03/15/20 1554  GLUCAP 144* 176* 171* 146* 199*   HbA1C: No results for input(s): HGBA1C in the last 72 hours.   Urine analysis:    Component Value Date/Time   COLORURINE YELLOW 04/04/2019 1623   APPEARANCEUR CLEAR 04/04/2019 1623   LABSPEC 1.038 (H) 04/04/2019 1623   PHURINE 5.0 04/04/2019 1623   GLUCOSEU >=500 (A) 04/04/2019 1623   HGBUR NEGATIVE 04/04/2019 1623    BILIRUBINUR NEGATIVE 04/04/2019 1623   KETONESUR 20 (A) 04/04/2019 1623   PROTEINUR NEGATIVE 04/04/2019 1623   NITRITE NEGATIVE 04/04/2019 1623   LEUKOCYTESUR NEGATIVE 04/04/2019 1623   Sepsis Labs:  Recent Results (from the past 240 hour(s))  Resp Panel by RT-PCR (Flu A&B, Covid) Nasopharyngeal Swab     Status: Abnormal   Collection Time: 03/14/2020 10:28 PM   Specimen: Nasopharyngeal Swab; Nasopharyngeal(NP) swabs in vial transport medium  Result Value Ref Range Status   SARS Coronavirus 2 by RT PCR POSITIVE (A) NEGATIVE Final    Comment: RESULT CALLED TO, READ BACK BY AND VERIFIED WITH: M BRAIN AT 0018 ON 03/06/2020 BY JPM (NOTE) SARS-CoV-2 target nucleic acids are DETECTED.  The SARS-CoV-2 RNA is generally detectable in upper respiratory specimens during the acute phase of infection. Positive results are indicative of the presence of the identified virus, but do not rule out bacterial infection or co-infection with other pathogens not detected by the test. Clinical correlation with patient history and other diagnostic information is necessary to determine patient infection status. The expected result is Negative.  Fact Sheet for Patients: EntrepreneurPulse.com.au  Fact Sheet for Healthcare Providers: IncredibleEmployment.be  This test is not yet approved or cleared by the [place] FDA and  has been authorized for detection and/or diagnosis of SARS-CoV-2 by FDA under an Emergency Use Authorization (EUA).  This EUA will remain in effect (meaning this test can  be used) for the duration of  the COVID-19 declaration under Section 564(b)(1) of the Act, 21 U.S.C. section 360bbb-3(b)(1), unless the authorization is terminated or revoked sooner.     Influenza A by PCR NEGATIVE NEGATIVE Final   Influenza B by PCR NEGATIVE NEGATIVE Final    Comment: (NOTE) The Xpert Xpress SARS-CoV-2/FLU/RSV plus assay is intended as an aid in the diagnosis  of influenza from Nasopharyngeal swab specimens and should not be used as a sole basis for treatment. Nasal washings and aspirates are unacceptable for Xpert Xpress SARS-CoV-2/FLU/RSV testing.  Fact Sheet for Patients: EntrepreneurPulse.com.au  Fact Sheet for Healthcare Providers: IncredibleEmployment.be  This test is not yet approved or cleared by the [place] FDA and has been authorized for detection and/or diagnosis of SARS-CoV-2 by FDA under an Emergency Use Authorization (EUA). This EUA will remain in effect (meaning this test can be used) for the duration of the COVID-19 declaration under Section 564(b)(1) of the Act, 21 U.S.C. section 360bbb-3(b)(1), unless the authorization is terminated or revoked.  Performed at [facility], [address] [place], [place] [address]   MRSA PCR Screening     Status: None   Collection Time: 03/10/20 12:47 AM   Specimen: Nasal Mucosa; Nasopharyngeal  Result Value Ref Range Status   MRSA by PCR NEGATIVE NEGATIVE Final    Comment:        The GeneXpert MRSA Assay (FDA approved for NASAL specimens only), is one component of a comprehensive MRSA colonization surveillance program. It is not intended to diagnose MRSA infection nor to guide or monitor treatment for MRSA  infections. Performed at [facility], [address] [place], [place] [address]      Scheduled Meds: . Chlorhexidine Gluconate Cloth  6 each Topical Daily  . enoxaparin (LOVENOX) injection  40 mg Subcutaneous Q24H  . insulin aspart  0-15 Units Subcutaneous Q4H  . insulin glargine  12 Units Subcutaneous QHS  . Ipratropium-Albuterol  1 puff Inhalation TID  . methylPREDNISolone (SOLU-MEDROL) injection  60 mg Intravenous Q12H  . metoprolol tartrate  2.5 mg Intravenous Q8H  . ondansetron (ZOFRAN) IV  4 mg Intravenous Q6H  . pantoprazole (PROTONIX) IV  40 mg Intravenous Q12H   Continuous Infusions: . 0.9 % NaCl with KCl 20 mEq / L  100 mL/hr at 03/15/20 8527    Procedures/Studies: DG Abd 1 View  Result Date: 03/09/2020 CLINICAL DATA:  Abdominal distension EXAM: ABDOMEN - 1 VIEW COMPARISON:  03/07/2020 FINDINGS: Air-filled, dilated loops of small bowel are again identified. Degree of distention is similar but there are increased dilated loops. No definite free air on this supine view. Colon appears to be decompressed. IMPRESSION: Similar degree of small-bowel distention with increased number of dilated loops visualized. Electronically Signed   By: Macy Mis M.D.   On: 03/09/2020 08:08   CT ABDOMEN PELVIS W CONTRAST  Result Date: 03/06/2020 CLINICAL DATA:  Abdominal pain EXAM: CT ABDOMEN AND PELVIS WITH CONTRAST TECHNIQUE: Multidetector CT imaging of the abdomen and pelvis was performed using the standard protocol following bolus administration of intravenous contrast. CONTRAST:  117mL OMNIPAQUE IOHEXOL 300 MG/ML  SOLN COMPARISON:  April 04, 2019 FINDINGS: Lower chest: The visualized heart size within normal limits. No pericardial fluid/thickening. No hiatal hernia. The visualized portions of the lungs are clear. Hepatobiliary: The liver is normal in density without focal abnormality.The main portal vein is patent. The patient is status post cholecystectomy. No biliary ductal dilation. Pancreas: Unremarkable. No pancreatic ductal dilatation or surrounding inflammatory changes. Spleen: Normal in size without focal abnormality. Adrenals/Urinary Tract: Both adrenal glands appear normal. The kidneys and collecting system appear normal without evidence of urinary tract calculus or hydronephrosis. Bladder is unremarkable. Stomach/Bowel: The stomach and proximal small bowel are unremarkable. There are mildly dilated air and fluid-filled loops of ileum measuring up to 3.2 cm. No clear transition point or focal wall narrowing is noted. No surrounding inflammatory changes are seen. There is been a prior colectomy with an ileocolonic  anastomosis within the deep pelvis. Vascular/Lymphatic: There are no enlarged mesenteric, retroperitoneal, or pelvic lymph nodes. Scattered aortic atherosclerotic calcifications are seen without aneurysmal dilatation. Reproductive: The prostate is unremarkable. Other: No evidence of abdominal wall mass or hernia. Musculoskeletal: No acute or significant osseous findings. IMPRESSION: Mildly dilated fluid and air-filled ileal loops without a clear transition point. This is likely due to a partial small bowel obstruction. Prior colectomy with anastomosis within the deep pelvis. Aortic Atherosclerosis (ICD10-I70.0). Electronically Signed   By: Prudencio Pair M.D.   On: 03/06/2020 00:46   DG CHEST PORT 1 VIEW  Result Date: 03/14/2020 CLINICAL DATA:  COVID positive.  Short of breath. EXAM: PORTABLE CHEST 1 VIEW COMPARISON:  04/19/2015 FINDINGS: Extensive diffuse bilateral airspace disease, involving the upper and lower lobes. No effusion or collapse. No pneumothorax. Heart size normal. IMPRESSION: Extensive diffuse bilateral airspace disease compatible with COVID pneumonia. Electronically Signed   By: Franchot Gallo M.D.   On: 03/14/2020 11:21   DG Abd 2 Views  Result Date: 03/10/2020 CLINICAL DATA:  Small-bowel obstruction. EXAM: ABDOMEN - 2 VIEW COMPARISON:  03/09/2020.  CT  03/06/2020. FINDINGS: Surgical clips right upper quadrant. Persistent prominent small bowel distention noted. Prior colectomy. No free air. Degenerative changes lumbar spine and both hips. IMPRESSION: Persistent prominent small bowel distention. Findings consistent with persistent small bowel obstruction. Prior colectomy. No free air. Electronically Signed   By: Marcello Moores  Register   On: 03/10/2020 05:37   DG Abd 2 Views  Result Date: 03/07/2020 CLINICAL DATA:  Acute generalized abdominal pain. EXAM: ABDOMEN - 2 VIEW COMPARISON:  April 08, 2019.  March 06, 2020. FINDINGS: Moderately dilated small bowel loops are noted concerning for  distal small bowel obstruction. No definite free air is noted. No colonic dilatation is noted. Status post cholecystectomy. No abnormal calcifications are noted. IMPRESSION: Moderately dilated small bowel loops are noted concerning for distal small bowel obstruction. Electronically Signed   By: Marijo Conception M.D.   On: 03/07/2020 09:32    Barton Dubois, MD  [facility]  If 7PM-7AM, please contact night-coverage [email or web address] Password [email or web address] 03/15/2020, 4:20 PM   LOS: 9 days  Other: No

## 2020-03-16 ENCOUNTER — Telehealth (INDEPENDENT_AMBULATORY_CARE_PROVIDER_SITE_OTHER): Payer: Self-pay | Admitting: Internal Medicine

## 2020-03-16 DIAGNOSIS — R140 Abdominal distension (gaseous): Secondary | ICD-10-CM | POA: Diagnosis not present

## 2020-03-16 DIAGNOSIS — R0602 Shortness of breath: Secondary | ICD-10-CM

## 2020-03-16 DIAGNOSIS — R0902 Hypoxemia: Secondary | ICD-10-CM

## 2020-03-16 DIAGNOSIS — K56600 Partial intestinal obstruction, unspecified as to cause: Secondary | ICD-10-CM | POA: Diagnosis not present

## 2020-03-16 DIAGNOSIS — R1030 Lower abdominal pain, unspecified: Secondary | ICD-10-CM | POA: Diagnosis not present

## 2020-03-16 DIAGNOSIS — U071 COVID-19: Secondary | ICD-10-CM | POA: Diagnosis not present

## 2020-03-16 LAB — GLUCOSE, CAPILLARY
Glucose-Capillary: 171 mg/dL — ABNORMAL HIGH (ref 70–99)
Glucose-Capillary: 164 mg/dL — ABNORMAL HIGH (ref 70–99)
Glucose-Capillary: 132 mg/dL — ABNORMAL HIGH (ref 70–99)
Glucose-Capillary: 154 mg/dL — ABNORMAL HIGH (ref 70–99)
Glucose-Capillary: 154 mg/dL — ABNORMAL HIGH (ref 70–99)
Glucose-Capillary: 231 mg/dL — ABNORMAL HIGH (ref 70–99)
Glucose-Capillary: 206 mg/dL — ABNORMAL HIGH (ref 70–99)

## 2020-03-16 LAB — PROCALCITONIN: Procalcitonin: <0.1 ng/mL

## 2020-03-16 NOTE — Progress Notes (Signed)
Subjective: No abdominal pain today. Had a bowel movement yesterday. Still passing gas. Abdomen feels much better. Still short of breath. No other overt GI complaints.  Objective: Vital signs in last 24 hours: Temp:  [97.1 F (36.2 C)-97.8 F (36.6 C)] 97.1 F (36.2 C) (01/26 0838) Pulse Rate:  [72-93] 80 (01/26 0800) Resp:  [22-37] 30 (01/26 0800) BP: (116-165)/(46-98) 149/71 (01/26 0800) SpO2:  [83 %-96 %] 90 % (01/26 0800) FiO2 (%):  [100 %] 100 % (01/26 0748) Last BM Date: 03/15/20 General:   Alert and oriented, pleasant. Sitting in bedside chair. Head:  Normocephalic and atraumatic. Heart:  S1, S2 present, no murmurs noted.  Lungs: Clear to auscultation bilaterally, HFNC in place with NRB over it. Mildly tachypnic at time of exam.  Abdomen:  Bowel sounds present, soft, non-tender, non-distended. No HSM or hernias noted. No rebound or guarding. No masses appreciated  Msk:  Symmetrical without gross deformities. Extremities:  Without clubbing or edema. Neurologic:  Alert and  oriented x4;  grossly normal neurologically. Psych:  Alert and cooperative. Normal mood and affect.  Intake/Output from previous day: 01/25 0701 - 01/26 0700 In: 2718.1 [P.O.:250; I.V.:2468.1] Out: 1000 [Urine:1000] Intake/Output this shift: Total I/O In: 268.3 [I.V.:268.3] Out: -   Lab Results: Recent Labs    03/14/20 0812 03/15/20 0346  WBC 17.9* 13.6*  HGB 17.3* 16.6  HCT 49.6 46.6  PLT 231 227   BMET Recent Labs    03/14/20 0812 03/15/20 0346  NA 133* 131*  K 4.4 4.5  CL 98 96*  CO2 27 25  GLUCOSE 162* 165*  BUN 13 13  CREATININE 0.57* 0.51*  CALCIUM 8.5* 8.3*   LFT Recent Labs    03/14/20 0812 03/15/20 0346  PROT 6.2* 5.9*  ALBUMIN 3.0* 2.8*  AST 30 29  ALT 18 17  ALKPHOS 49 52  BILITOT 1.4* 1.6*   PT/INR No results for input(s): LABPROT, INR in the last 72 hours. Hepatitis Panel No results for input(s): HEPBSAG, HCVAB, HEPAIGM, HEPBIGM in the last 72  hours.   Studies/Results: DG CHEST PORT 1 VIEW  Result Date: 03/14/2020 CLINICAL DATA:  COVID positive.  Short of breath. EXAM: PORTABLE CHEST 1 VIEW COMPARISON:  04/19/2015 FINDINGS: Extensive diffuse bilateral airspace disease, involving the upper and lower lobes. No effusion or collapse. No pneumothorax. Heart size normal. IMPRESSION: Extensive diffuse bilateral airspace disease compatible with COVID pneumonia. Electronically Signed   By: Franchot Gallo M.D.   On: 03/14/2020 11:21    Assessment: Small bowel obstruction - most likely secondary to adhesions given prior history of extensive surgery for ulcerative colitis. Initially declined NGT but on the 17th he decided to proceed. This was placed endoscopically 8/78 due to complicated anatomy/previous nasal fractures; however it was removed by the patient later that night. We were reconsulted due to persistent symptoms (surgery felt non-surgical need). Abdominal distension was precluding BiPAP for COVID pneumonia and patient again declined replacement of NGT. He did eventually begin passing flatus and distension improved. He did finally have a loose bowel movement yesterday (baseline stools are loose due to previous subtotal colectomy. He may also have an element of ileus given Covid infection and medications as well as bedrest.  Today he is still passing flatus.  Abdominal distention is decreased, abdomen soft and non-tender. He is tolerating full liquids.  Will hold off abdominal CT given improvement in last 24 hours. Appears his GI issues are resolving and primary care driver [id number] pneumonia.  Covid pneumonia -  He tested positive for Covid on admission on 02/29/2020.  He did not have any respiratory symptoms initially. However, he did develop significant symptoms and was  Today is his fifth/final day of remdesivir. Chest XRay 03/14/20 shows consistent with extensive Covid pneumonia. He is on oxygen at 80% and flow rate of 25 L/min with O2 sats  83-91% in the last 24 hours.  Plan: 1. Continue GI supportive care 2. Monitor for worsening abdominal pain/distension 3. Consider advancing to soft diet (will discuss with Dr. Laural Golden) 4. Continue judicious use of narcotic pain medications   Thank you for allowing [place] to participate in the care of Calvin Cruz  Walden Field, DNP, AGNP-C Adult & Gerontological Nurse Practitioner [facility]    LOS: 10 days    03/16/2020, 8:56 AM

## 2020-03-16 NOTE — Progress Notes (Signed)
PROGRESS NOTE    Calvin Cruz  WER:[id number] DOB: Feb 22, 1960 DOA: Mar 21, 2020 PCP: Reynold Bowen, MD   Brief Narrative:  60 year old male with a history ofsecondarypolycythemia(JAK2 neg), hyperlipidemia, diabetes mellitus type 2, hypertension, coronary artery disease status post NSTEMI with DES to the circumflex June 2016, ulcerative colitis status post proctocolectomy with ileoanal anastomosis, and recurrent episodes of pouchitis presenting with upper abdominal pain, nausea, vomiting, and loose stools that began on 03/04/2020. He states that he normally has 5-6 loose bowel movements on average day secondary to his ulcerative colitis. He normally uses Imodium, usually 1 to 2 tablets on a daily basis. His last bowel movement was on 03/21/20. He denies any fevers, chills, chest pain, shortness of breath, cough, hemoptysis. He denies any headache, sore throat, hemoptysis. He has had numerous episodes of emesis on 21-Mar-2020 resulting in dry heaving. There is no hematemesis. He denies hematochezia or melena.He has not had a bowel movement since the afternoon of 03/12/2020. In the emergency department, the patient was afebrile hemodynamically stable with oxygen saturation 94-96% on room air. BMP showed a sodium 135, potassium 4.1, BUN 14, serum creatinine 0.81. LFTs were unremarkable. Lipase 21. WBC 7.1, hemoglobin 17.7, platelets 174,000. CT of the abdomen and pelvis showed mildly dilated air-fluid filled loops of ileum up to 3.2 cm. There was no clear transition point or focal narrowing. There is no surrounding inflammatory changes. He is status post colectomy with ileal colonic anastomosis.  Assessment & Plan:   Principal Problem:   Partial small bowel obstruction (HCC) Active Problems:   Hyperlipidemia with target LDL less than 70   Essential hypertension   CAD S/P DES PCI-circumflex   Abdominal pain   COVID-19 virus infection   Hyperglycemia due to diabetes mellitus  (HCC)   Nausea and vomiting   Gastroenteritis due to COVID-19 virus   Partial small bowel obstruction-resolving -Patient currently on full liquid diet with further advancement per GI -continue judicious use of narcotics; goal is not for pain free; but to assist tolerating symptoms.   -Continue the use of levsin as needed . -Continue PRN antiemetics.  -Will continue to follow recommendations by GI service -Given: Respiratory decompensation holding on CT abdomen -Patient also with improvement in his distention, having bowel sounds and had experienced multiple bowel movements overnight.  No nausea or vomiting currently. -Patient has been instructed to increase physical activity.  Acute respiratory failure with hypoxia due to COVID-19 pneumonia; patient also with COVID-19 gastroenteritis/Infection -Patient was found to beincidentallyCOVID-19 positive by RT-PCR -pt's symptoms with minimal pulm symptoms of < 5 days duration with "high risk" criteria-currently using 25 L nasal cannula supplementation intermittently. -With continue to follow inflammatory markers -Remdesivir completed, continues on steroids -Will continue antiemetics as needed; Reglan added for refractory symptoms if required.. -Hypoactive bowel sounds appreciated on exam; patient's abdomen soft and mildly tender. -Stable electrolytes appreciated today. -continuePPI. -Patient has not been vaccinated -Appreciate GI consultation; simethicone to assist with over tympanic and obstipation symptoms.  -CRP--3.3>>2.0>>17>>15>>2.6 -Continue to monitor renal function and LFTs. -Repeat chest x-ray on 03/14/2020 demonstrating diffuse bilateral infiltrates/opacities consistent with Covid infection. -Following family request, pulmonology service has been consulted to further evaluate patient's care. -Check procalcitonin  Coronary artery disease with history of NSTEMI -No chest pain presently -Restart Brilinta when able to fully   tolerate p.o. -Patient follows with Dr. Glenetta Hew -Beta-blocker has been resumed.  Diabetes mellitus type 2, controlled -Restart Jardiance once able to tolerate p.o. -Continue sliding scale insulin while inpatient. -03/07/20 A1C--6.7 -Treatment  for COVID infection using the steroids has been started; CBGs expected to get elevated -Will add low-dose Lantus to assist with management.  Hyperlipidemia -Restart Crestor and Vascepa once able to tolerate p.o.  Pituitary Tumor  -Found in 08/28/16 Brain MRI  -Will continue outpatient follow up with Dr. Forde Dandy.   Status is: Inpatient  Remains inpatient appropriate because: IV treatments appropriate due to intensity of illness or inability to take PO   Dispo: The patient is from:Home Anticipated d/c is [id number] Anticipated d/c date is: To be determined Patient currently is not medically stable to d/c.   Family Communication:spouse updated 03/16/20  Consultants:GI, general surgery  Code Status: FULL  DVT Prophylaxis: [place] Lovenox   Procedures: As Listed in Progress Note Above  Antibiotics: Anti-infectives (From admission, onward)   Start     Dose/Rate Route Frequency Ordered Stop   03/11/20 1000  remdesivir 100 mg in sodium chloride 0.9 % 100 mL IVPB        100 mg 200 mL/hr over 30 Minutes Intravenous Daily 03/10/20 1105 03/14/20 0900   03/10/20 1200  remdesivir 100 mg in sodium chloride 0.9 % 100 mL IVPB        100 mg 200 mL/hr over 30 Minutes Intravenous Every 1 hr x 2 03/10/20 1105 03/10/20 1320      Subjective: Patient seen and evaluated today with no new acute complaints or concerns. No acute concerns or events noted overnight. He still remains on high amounts of oxygen supplementation. He appears to have had some bowel movements and is tolerating full liquid diet.  Objective: Vitals:   03/16/20 0901 03/16/20 1000 03/16/20 1128 03/16/20 1200  BP: 132/69  (!) 128/93  122/62  Pulse: 82 91 95 87  Resp: (!) 27     Temp:   97.9 F (36.6 C)   TempSrc:   Oral   SpO2: 92% 90% (!) 87% 92%  Weight:      Height:        Intake/Output Summary (Last 24 hours) at 03/16/2020 1244 Last data filed at 03/16/2020 1203 Gross per 24 hour  Intake 2832.88 ml  Output 1000 ml  Net 1832.88 ml   Filed Weights   03/13/20 0300 03/14/20 0348 03/15/20 0500  Weight: 99.3 kg 97.9 kg 97.5 kg    Examination:  General exam: Appears calm and comfortable  Respiratory system: Clear to auscultation. Respiratory effort normal. Currently on 20 L heated high flow nasal cannula with nonrebreather. Cardiovascular system: S1 & S2 heard, RRR.  Gastrointestinal system: Abdomen is soft Central nervous system: Alert and awake Extremities: No edema Skin: No significant lesions noted Psychiatry: Flat affect.    Data Reviewed: I have personally reviewed following labs and imaging studies  CBC: Recent Labs  Lab 03/10/20 0326 03/11/20 0443 03/13/20 0421 03/14/20 0812 03/15/20 0346  WBC 4.5 6.9 13.8* 17.9* 13.6*  NEUTROABS 3.3 5.3  --   --   --   HGB 17.4* 17.0 16.3 17.3* 16.6  HCT 49.4 48.6 46.4 49.6 46.6  MCV 89.2 90.5 91.5 90.8 88.3  PLT 156 151 205 231 [id number]   Basic Metabolic Panel: Recent Labs  Lab 03/10/20 0326 03/11/20 0443 03/13/20 0421 03/14/20 0812 03/15/20 0346  NA 134* 138 137 133* 131*  K 3.0* 3.6 4.1 4.4 4.5  CL 93* 96* 101 98 96*  CO2 28 29 27 27 25   GLUCOSE 205* 218* 238* 162* 165*  BUN 25* 27* 15 13 13   CREATININE 1.07 0.87 0.64 0.57* 0.51*  CALCIUM  9.1 8.9 8.6* 8.5* 8.3*  MG 1.8 2.1  --   --   --   PHOS 4.2 2.5  --   --   --    GFR: Estimated Creatinine Clearance: 125.4 mL/min (A) (by C-G formula based on SCr of 0.51 mg/dL (L)). Liver Function Tests: Recent Labs  Lab 03/10/20 0326 03/11/20 0443 03/13/20 0421 03/14/20 0812 03/15/20 0346  AST 35 31 25 30 29   ALT 24 25 20 18 17   ALKPHOS 41 38 36* 49 52  BILITOT 0.6 0.6 1.0 1.4*  1.6*  PROT 7.3 7.0 5.9* 6.2* 5.9*  ALBUMIN 4.1 3.6 3.1* 3.0* 2.8*   No results for input(s): LIPASE, AMYLASE in the last 168 hours. No results for input(s): AMMONIA in the last 168 hours. Coagulation Profile: No results for input(s): INR, PROTIME in the last 168 hours. Cardiac Enzymes: No results for input(s): CKTOTAL, CKMB, CKMBINDEX, TROPONINI in the last 168 hours. BNP (last 3 results) No results for input(s): PROBNP in the last 8760 hours. HbA1C: No results for input(s): HGBA1C in the last 72 hours. CBG: Recent Labs  Lab 03/15/20 2021 03/16/20 0108 03/16/20 0405 03/16/20 0757 03/16/20 1119  GLUCAP 214* 171* 164* 132* 154*   Lipid Profile: No results for input(s): CHOL, HDL, LDLCALC, TRIG, CHOLHDL, LDLDIRECT in the last 72 hours. Thyroid Function Tests: No results for input(s): TSH, T4TOTAL, FREET4, T3FREE, THYROIDAB in the last 72 hours. Anemia Panel: Recent Labs    03/14/20 0812 03/15/20 0346  FERRITIN 1,102* 1,256*   Sepsis Labs: No results for input(s): PROCALCITON, LATICACIDVEN in the last 168 hours.  Recent Results (from the past 240 hour(s))  MRSA PCR Screening     Status: None   Collection Time: 03/10/20 12:47 AM   Specimen: Nasal Mucosa; Nasopharyngeal  Result Value Ref Range Status   MRSA by PCR NEGATIVE NEGATIVE Final    Comment:        The GeneXpert MRSA Assay (FDA approved for NASAL specimens only), is one component of a comprehensive MRSA colonization surveillance program. It is not intended to diagnose MRSA infection nor to guide or monitor treatment for MRSA infections. Performed at [facility], [address], [place], [place] [address]          Radiology Studies: No results found.      Scheduled Meds: . Chlorhexidine Gluconate Cloth  6 each Topical Daily  . enoxaparin (LOVENOX) injection  40 mg Subcutaneous Q24H  . insulin aspart  0-15 Units Subcutaneous Q4H  . insulin glargine  12 Units Subcutaneous QHS  .  Ipratropium-Albuterol  1 puff Inhalation TID  . methylPREDNISolone (SOLU-MEDROL) injection  60 mg Intravenous Q12H  . metoprolol tartrate  2.5 mg Intravenous Q8H  . ondansetron (ZOFRAN) IV  4 mg Intravenous Q6H  . pantoprazole (PROTONIX) IV  40 mg Intravenous Q12H   Continuous Infusions: . 0.9 % NaCl with KCl 20 mEq / L 100 mL/hr at 03/16/20 1203     LOS: 10 days    Time spent: 35 minutes    Shawnise Peterkin Darleen Crocker, DO [facility]  If 7PM-7AM, please contact night-coverage [email or web address] 03/16/2020, 12:44 PM

## 2020-03-16 NOTE — Evaluation (Signed)
Physical Therapy Evaluation Patient Details Name: Calvin Cruz MRN: [id number] DOB: 08-01-1960 Today's Date: 03/16/2020   History of Present Illness  Calvin Cruz is a 60 y.o. male with medical history significant for hx of ulcerative colitis, s/p colectomy with Hartman's pouch formation,  gout, NSTEMI, CAD s/p DES June 2016, hypertension and hyperlipidemia who presents to the emergency department due to 1 day onset of lower abdominal pain with associated nausea and vomiting.  Pain was described as sharp, intermittent and rated as 7-8/10 on pain scale, he also complained of nonbloody but bilious like vomiting of several episodes since onset of symptoms.  No alleviating/aggravating factors known, symptoms are similar to prior small bowel obstruction (last SBO was about a year ago per patient).  Last bowel movement was yesterday (1/15) in the morning.  He denies fever, chills, chest pain, shortness of breath, diarrhea or any sick contact.  Patient states that he never had COVID-vaccine    Clinical Impression  Patient is very unsteady on feet having to lean on nearby objects for support when standing, required use of RW to maintain standing balance, able to march in place standing for approximately 2-3 minutes before having to sit due to fatigue while on 35 LPM HFNC with SpO2 dropping from 91% to 76%.  Patient tolerated sitting up in chair after therapy - RN notified.  Patient will benefit from continued physical therapy in hospital and recommended venue below to increase strength, balance, endurance for safe ADLs and gait.     Follow Up Recommendations Home health PT;Supervision for mobility/OOB;Supervision - Intermittent    Equipment Recommendations  Rolling walker with 5" wheels    Recommendations for Other Services       Precautions / Restrictions Precautions Precautions: Fall Restrictions Weight Bearing Restrictions: No      Mobility  Bed Mobility Overal bed mobility: Modified  Independent             General bed mobility comments: increased time, slightly labored movement    Transfers Overall transfer level: Needs assistance Equipment used: 1 person hand held assist;None Transfers: Sit to/from [facility] Sit to Stand: Min guard Stand pivot transfers: Min guard       General transfer comment: increased time, labored movement  Ambulation/Gait Ambulation/Gait assistance: Min assist Gait Distance (Feet): 15 Feet Assistive device: Rolling walker (2 wheeled);None;1 person hand held assist Gait Pattern/deviations: Decreased step length - right;Decreased step length - left;Decreased stride length Gait velocity: decreased   General Gait Details: unsteady labored cadence with frequent leaning on nearby objects for support, able to march in place standing x 15 steps before having to sit due to fatigue, on 35 HFNC LPM  Stairs            Wheelchair Mobility    Modified Rankin (Stroke Patients Only)       Balance Overall balance assessment: Needs assistance Sitting-balance support: Feet supported;No upper extremity supported Sitting balance-Leahy Scale: Good Sitting balance - Comments: seated at EOB   Standing balance support: During functional activity;No upper extremity supported Standing balance-Leahy Scale: Poor Standing balance comment: fair/poor without AD, fair using RW                             Pertinent Vitals/Pain Pain Assessment: No/denies pain    Home Living Family/patient expects to be discharged to:: Private residence Living Arrangements: Spouse/significant other Available Help at Discharge: Family Type of Home: House Home Access: Stairs  to enter   Entrance Stairs-Number of Steps: 8 Home Layout: Two level;Able to live on main level with bedroom/bathroom;Bed/bath upstairs;Full bath on main level Home Equipment: None      Prior Function Level of Independence: Independent          Comments: community ambulator, drives     Journalist, newspaper        Extremity/Trunk Assessment   Upper Extremity Assessment Upper Extremity Assessment: Generalized weakness    Lower Extremity Assessment Lower Extremity Assessment: Generalized weakness    Cervical / Trunk Assessment Cervical / Trunk Assessment: Normal  Communication   Communication: No difficulties  Cognition Arousal/Alertness: Awake/alert Behavior During Therapy: WFL for tasks assessed/performed Overall Cognitive Status: Within Functional Limits for tasks assessed                                        General Comments      Exercises     Assessment/Plan    PT Assessment Patient needs continued PT services  PT Problem List Decreased strength;Decreased activity tolerance;Decreased balance;Decreased mobility       PT Treatment Interventions DME instruction;Gait training;Stair training;Functional mobility training;Therapeutic activities;Therapeutic exercise;Patient/family education;Balance training    PT Goals (Current goals can be found in the Care Plan section)  Acute Rehab PT Goals Patient Stated Goal: return home with family to assist PT Goal Formulation: With patient Time For Goal Achievement: 03/23/20 Potential to Achieve Goals: Good    Frequency Min 3X/week   Barriers to discharge        Co-evaluation               AM-PAC PT "6 Clicks" Mobility  Outcome Measure Help needed turning from your back to your side while in a flat bed without using bedrails?: None Help needed moving from lying on your back to sitting on the side of a flat bed without using bedrails?: A Little Help needed moving to and from a bed to a chair (including a wheelchair)?: A Little Help needed standing up from a chair using your arms (e.g., wheelchair or bedside chair)?: A Little Help needed to walk in hospital room?: A Lot Help needed climbing 3-5 steps with a railing? : A Lot 6 Click  Score: 17    End of Session Equipment Utilized During Treatment: Oxygen Activity Tolerance: Patient tolerated treatment well;Patient limited by fatigue Patient left: in chair;with call bell/phone within reach Nurse Communication: Mobility status PT Visit Diagnosis: Unsteadiness on feet (R26.81);Other abnormalities of gait and mobility (R26.89);Muscle weakness (generalized) (M62.81)    Time: 1610-9604 PT Time Calculation (min) (ACUTE ONLY): 27 min   Charges:   PT Evaluation $PT Eval Moderate Complexity: 1 Mod PT Treatments $Therapeutic Activity: 23-37 mins        2:19 PM, 03/16/20 Lonell Grandchild, MPT Physical Therapist with [facility] [phone] office [phone] mobile phone

## 2020-03-16 NOTE — Telephone Encounter (Signed)
Per Dr.Rehman  he told the wife that it may a good idea for her to see patient for a few hours a day. He advised her to talk with the hospital staff, that this was not his decision.  I have called Calvin Cruz and made her aware.

## 2020-03-16 NOTE — Telephone Encounter (Signed)
Wife called the office very upset - stated Dr Laural Golden had said she could spend approx 2 hours with patient each day - stated when she got ready to leave yesterday she told the nurse Estill Bamberg) that she would see her tomorrow - nurse stated she could not see him today because of protocol - states patient called her last night stated he could not breathe - and nobody  had been in his room - wife calls hospital talks to nurse they tell her his vitals her fine - she states she wants him moved to another facility - please advise - ph# [phone]

## 2020-03-16 NOTE — Plan of Care (Signed)
  Problem: Acute Rehab PT Goals(only PT should resolve) Goal: Pt Will Go Supine/Side To Sit 03/16/2020 1427 by Lonell Grandchild, PT Outcome: Progressing 03/16/2020 1424 by Lonell Grandchild, PT Outcome: Progressing Flowsheets (Taken 03/16/2020 1424) Pt will go Supine/Side to Sit:  Independently  with modified independence   Problem: Acute Rehab PT Goals(only PT should resolve) Goal: Patient Will Transfer Sit To/From Stand Outcome: Progressing Flowsheets (Taken 03/16/2020 1424) Patient will transfer sit to/from stand:  with modified independence  with supervision   Problem: Acute Rehab PT Goals(only PT should resolve) Goal: Pt Will Transfer Bed To Chair/Chair To Bed Outcome: Progressing Flowsheets (Taken 03/16/2020 1427) Pt will Transfer Bed to Chair/Chair to Bed:  with modified independence  with supervision   Problem: Acute Rehab PT Goals(only PT should resolve) Goal: Pt Will Ambulate Outcome: Progressing Flowsheets (Taken 03/16/2020 1427) Pt will Ambulate:  50 feet  with supervision  with least restrictive assistive device   2:29 PM, 03/16/20 Lonell Grandchild, MPT Physical Therapist with [facility] [phone] office [phone] mobile phone

## 2020-03-16 NOTE — Telephone Encounter (Signed)
I have already addressed this with patient's wife Calvin Cruz

## 2020-03-16 NOTE — Telephone Encounter (Signed)
A message has ben sent to Dr.Rehman. Awaiting his recommendation.

## 2020-03-17 ENCOUNTER — Inpatient Hospital Stay (HOSPITAL_COMMUNITY): Payer: Commercial Managed Care - PPO

## 2020-03-17 DIAGNOSIS — K56600 Partial intestinal obstruction, unspecified as to cause: Secondary | ICD-10-CM | POA: Diagnosis not present

## 2020-03-17 LAB — C-REACTIVE PROTEIN: CRP: 11.8 mg/dL — ABNORMAL HIGH (ref <1.0)

## 2020-03-17 LAB — TSH: TSH: 0.951 u[IU]/mL (ref 0.350–4.500)

## 2020-03-17 LAB — COMPREHENSIVE METABOLIC PANEL
ALT: 17 U/L (ref 0–44)
AST: 21 U/L (ref 15–41)
Albumin: 2.7 g/dL — ABNORMAL LOW (ref 3.5–5.0)
Alkaline Phosphatase: 64 U/L (ref 38–126)
Anion gap: 12 (ref 5–15)
BUN: 14 mg/dL (ref 8–23)
CO2: 26 mmol/L (ref 22–32)
Calcium: 8.6 mg/dL — ABNORMAL LOW (ref 8.9–10.3)
Chloride: 89 mmol/L — ABNORMAL LOW (ref 98–111)
Creatinine, Ser: 0.51 mg/dL — ABNORMAL LOW (ref 0.61–1.24)
GFR, Estimated: >60 mL/min (ref >60)
Glucose, Bld: 149 mg/dL — ABNORMAL HIGH (ref 70–99)
Potassium: 4.3 mmol/L (ref 3.5–5.1)
Sodium: 127 mmol/L — ABNORMAL LOW (ref 135–145)
Total Bilirubin: 1.8 mg/dL — ABNORMAL HIGH (ref 0.3–1.2)
Total Protein: 6.3 g/dL — ABNORMAL LOW (ref 6.5–8.1)

## 2020-03-17 LAB — CBC
HCT: 48 % (ref 39.0–52.0)
Hemoglobin: 17 g/dL (ref 13.0–17.0)
MCH: 31.3 pg (ref 26.0–34.0)
MCHC: 35.4 g/dL (ref 30.0–36.0)
MCV: 88.2 fL (ref 80.0–100.0)
Platelets: 270 10*3/uL (ref 150–400)
RBC: 5.44 MIL/uL (ref 4.22–5.81)
RDW: 11.2 % — ABNORMAL LOW (ref 11.5–15.5)
WBC: 17.3 10*3/uL — ABNORMAL HIGH (ref 4.0–10.5)
nRBC: 0 % (ref 0.0–0.2)

## 2020-03-17 LAB — FERRITIN: Ferritin: 1305 ng/mL — ABNORMAL HIGH (ref 24–336)

## 2020-03-17 LAB — GLUCOSE, CAPILLARY
Glucose-Capillary: 148 mg/dL — ABNORMAL HIGH (ref 70–99)
Glucose-Capillary: 164 mg/dL — ABNORMAL HIGH (ref 70–99)
Glucose-Capillary: 213 mg/dL — ABNORMAL HIGH (ref 70–99)
Glucose-Capillary: 122 mg/dL — ABNORMAL HIGH (ref 70–99)
Glucose-Capillary: 129 mg/dL — ABNORMAL HIGH (ref 70–99)
Glucose-Capillary: 100 mg/dL — ABNORMAL HIGH (ref 70–99)

## 2020-03-17 LAB — MAGNESIUM: Magnesium: 1.9 mg/dL (ref 1.7–2.4)

## 2020-03-17 LAB — OSMOLALITY: Osmolality: 272 mOsm/kg — ABNORMAL LOW (ref 275–295)

## 2020-03-17 MED ORDER — SODIUM CHLORIDE 0.9 % IV SOLN
INTRAVENOUS | Status: DC
  Administered 2020-03-19: 75 mL/h via INTRAVENOUS

## 2020-03-17 MED ORDER — BARICITINIB 2 MG PO TABS
4.0000 mg | ORAL_TABLET | Freq: Every day | ORAL | Status: DC
  Administered 2020-03-17 – 2020-03-27 (×8): 4 mg via ORAL
  Filled 2020-03-17 (×10): qty 2

## 2020-03-17 MED ORDER — PANTOPRAZOLE SODIUM 40 MG PO TBEC
40.0000 mg | DELAYED_RELEASE_TABLET | Freq: Two times a day (BID) | ORAL | Status: DC
  Administered 2020-03-17 – 2020-03-27 (×12): 40 mg via ORAL
  Filled 2020-03-17 (×16): qty 1

## 2020-03-17 NOTE — Progress Notes (Signed)
PROGRESS NOTE    Calvin Cruz  [id number] DOB: 01-25-1961 DOA: 03/14/2020 PCP: Reynold Bowen, MD   Brief Narrative:  60 year old male with a history ofsecondarypolycythemia(JAK2 neg), hyperlipidemia, diabetes mellitus type 2, hypertension, coronary artery disease status post NSTEMI with DES to the circumflex June 2016, ulcerative colitis status post proctocolectomy with ileoanal anastomosis, and recurrent episodes of pouchitis presenting with upper abdominal pain, nausea, vomiting, and loose stools that began on 03/04/2020. He states that he normally has 5-6 loose bowel movements on average day secondary to his ulcerative colitis. He normally uses Imodium, usually 1 to 2 tablets on a daily basis. His last bowel movement was on 03/09/2020. He denies any fevers, chills, chest pain, shortness of breath, cough, hemoptysis. He denies any headache, sore throat, hemoptysis. He has had numerous episodes of emesis on 02/26/2020 resulting in dry heaving. There is no hematemesis. He denies hematochezia or melena.He has not had a bowel movement since the afternoon of 03/12/2020. In the emergency department, the patient was afebrile hemodynamically stable with oxygen saturation 94-96% on room air. BMP showed a sodium 135, potassium 4.1, BUN 14, serum creatinine 0.81. LFTs were unremarkable. Lipase 21. WBC 7.1, hemoglobin 17.7, platelets 174,000. CT of the abdomen and pelvis showed mildly dilated air-fluid filled loops of ileum up to 3.2 cm. There was no clear transition point or focal narrowing. There is no surrounding inflammatory changes. He is status post colectomy with ileal colonic anastomosis.   Assessment & Plan:   Principal Problem:   Partial small bowel obstruction (HCC) Active Problems:   Hyperlipidemia with target LDL less than 70   Essential hypertension   CAD S/P DES PCI-circumflex   Abdominal pain   COVID-19 virus infection   Hyperglycemia due to diabetes mellitus  (HCC)   Nausea and vomiting   Gastroenteritis due to COVID-19 virus   Abdominal distention   Hypoxemia   SOB (shortness of breath)   Partial small bowel obstruction-resolved -Patient currently on regular diet and tolerating -continue judicious use of narcotics; goal is not for pain free; but to assist tolerating symptoms.  -Continue the use of levsin as needed . -Continue PRN antiemetics.  -Will continue to follow recommendations by GI service prn -Given: Respiratory decompensation holding on CT abdomen, consider plan to repeat as needed? -Patient also with improvement in his distention, having bowel sounds and had experienced multiple bowel movements overnight. No nausea or vomiting currently. -Patient has been instructed to increase physical activity.  Acute respiratory failure with hypoxia due toCOVID-19pneumonia;patient also with COVID-19gastroenteritis/Infection -Patient was found to beincidentallyCOVID-19 positive by RT-PCR -Withcontinue to followinflammatory markers-still elevated -Remdesivir completed, continues on steroids -Inflammatory marker trend still increased and patient still requiring high amounts of oxygen without significant improvement, plan to start baricitinib today -continuePPI. -Patient has not been vaccinated -Appreciate GI consultation; simethicone to assist with over tympanic and obstipation symptoms.  -Continue tomonitor renal function and LFTs. -Repeat chest x-rayon1/24/2022 demonstrating diffuse bilateral infiltrates/opacities consistent with Covid infection. -Following family request,pulmonology service has been consulted to further evaluate patient's care. -Procalcitonin low and repeat chest x-ray stable on 1/27  Hyponatremia -Check TSH, urine/serum osmolarity -Changed to regular diet -IV normal saline added -Monitor in a.m.  Coronary artery disease with history of NSTEMI -No chest pain presently -Restart Brilinta when able to  fully tolerate p.o. -Patient follows with Dr. Glenetta Hew -Beta-blocker has been resumed.  Diabetes mellitus type 2, controlled -Restart Jardiance outpatient -Continue sliding scale insulin while inpatient. -03/07/20 A1C--6.7 -Treatment for COVID infection using the  steroids has been started; CBGs expected to get elevated -Will add low-dose Lantus to assist with management.  Hyperlipidemia -Restart Crestor and Vascepa once able to tolerate p.o.  Pituitary Tumor  -Found in 08/28/16 Brain MRI  -Will continue outpatient follow up with Dr. Forde Dandy.   Status is: Inpatient  Remains inpatient appropriate because: IV treatments appropriate due to intensity of illness or inability to take PO   Dispo: The patient is from:Home Anticipated d/c is VE:LFYB Anticipated d/c date is:To be determined Patient currently is not medically stable to d/c.   Family Communication:spouse updated 03/17/20  Consultants:GI, general surgery  Code Status: FULL  DVT Prophylaxis: [place] Lovenox   Procedures: As Listed in Progress Note Above  Antimicrobials:  Anti-infectives (From admission, onward)   Start     Dose/Rate Route Frequency Ordered Stop   03/11/20 1000  remdesivir 100 mg in sodium chloride 0.9 % 100 mL IVPB        100 mg 200 mL/hr over 30 Minutes Intravenous Daily 03/10/20 1105 03/14/20 0900   03/10/20 1200  remdesivir 100 mg in sodium chloride 0.9 % 100 mL IVPB        100 mg 200 mL/hr over 30 Minutes Intravenous Every 1 hr x 2 03/10/20 1105 03/10/20 1320       Subjective: Patient seen and evaluated today with no new acute complaints or concerns. No acute concerns or events noted overnight.  He appears to be tolerating his diet.  Unfortunately, continues to have high oxygen requirements with increase in her inflammatory marker trend noted.  Objective: Vitals:   03/17/20 0300 03/17/20 0400 03/17/20 0834 03/17/20 0900   BP: 129/80 (!) 149/82    Pulse: 86 96    Resp: (!) 29 (!) 36    Temp: (!) 96.3 F (35.7 C)  98 F (36.7 C)   TempSrc: Oral  Axillary   SpO2: 96% 94%  (!) 88%  Weight: 92.7 kg     Height:        Intake/Output Summary (Last 24 hours) at 03/17/2020 1004 Last data filed at 03/16/2020 1900 Gross per 24 hour  Intake 585.15 ml  Output -  Net 585.15 ml   Filed Weights   03/14/20 0348 03/15/20 0500 03/17/20 0300  Weight: 97.9 kg 97.5 kg 92.7 kg    Examination:  General exam: Appears calm and comfortable  Respiratory system: Clear to auscultation. Respiratory effort normal. HHFNC 30L/non-rebreather Cardiovascular system: S1 & S2 heard, RRR.  Gastrointestinal system: Abdomen is soft Central nervous system: Alert and awake Extremities: No edema Skin: No significant lesions noted Psychiatry: Flat affect.    Data Reviewed: I have personally reviewed following labs and imaging studies  CBC: Recent Labs  Lab 03/11/20 0443 03/13/20 0421 03/14/20 0812 03/15/20 0346 03/17/20 0435  WBC 6.9 13.8* 17.9* 13.6* 17.3*  NEUTROABS 5.3  --   --   --   --   HGB 17.0 16.3 17.3* 16.6 17.0  HCT 48.6 46.4 49.6 46.6 48.0  MCV 90.5 91.5 90.8 88.3 88.2  PLT 151 205 231 227 017   Basic Metabolic Panel: Recent Labs  Lab 03/11/20 0443 03/13/20 0421 03/14/20 0812 03/15/20 0346 03/17/20 0435  NA 138 137 133* 131* 127*  K 3.6 4.1 4.4 4.5 4.3  CL 96* 101 98 96* 89*  CO2 29 27 27 25 26   GLUCOSE 218* 238* 162* 165* 149*  BUN 27* 15 13 13 14   CREATININE 0.87 0.64 0.57* 0.51* 0.51*  CALCIUM 8.9 8.6* 8.5* 8.3* 8.6*  MG 2.1  --   --   --  1.9  PHOS 2.5  --   --   --   --    GFR: Estimated Creatinine Clearance: 125.4 mL/min (A) (by C-G formula based on SCr of 0.51 mg/dL (L)). Liver Function Tests: Recent Labs  Lab 03/11/20 0443 03/13/20 0421 03/14/20 0812 03/15/20 0346 03/17/20 0435  AST 31 25 30 29 21   ALT 25 20 18 17 17   ALKPHOS 38 36* 49 52 64  BILITOT 0.6 1.0 1.4* 1.6* 1.8*   PROT 7.0 5.9* 6.2* 5.9* 6.3*  ALBUMIN 3.6 3.1* 3.0* 2.8* 2.7*   No results for input(s): LIPASE, AMYLASE in the last 168 hours. No results for input(s): AMMONIA in the last 168 hours. Coagulation Profile: No results for input(s): INR, PROTIME in the last 168 hours. Cardiac Enzymes: No results for input(s): CKTOTAL, CKMB, CKMBINDEX, TROPONINI in the last 168 hours. BNP (last 3 results) No results for input(s): PROBNP in the last 8760 hours. HbA1C: No results for input(s): HGBA1C in the last 72 hours. CBG: Recent Labs  Lab 03/16/20 1644 03/16/20 1943 03/16/20 2311 03/17/20 0333 03/17/20 0745  GLUCAP 154* 231* 206* 148* 164*   Lipid Profile: No results for input(s): CHOL, HDL, LDLCALC, TRIG, CHOLHDL, LDLDIRECT in the last 72 hours. Thyroid Function Tests: No results for input(s): TSH, T4TOTAL, FREET4, T3FREE, THYROIDAB in the last 72 hours. Anemia Panel: Recent Labs    03/15/20 0346 03/17/20 0435  FERRITIN 1,256* 1,305*   Sepsis Labs: Recent Labs  Lab 03/16/20 1454  PROCALCITON <0.10    Recent Results (from the past 240 hour(s))  MRSA PCR Screening     Status: None   Collection Time: 03/10/20 12:47 AM   Specimen: Nasal Mucosa; Nasopharyngeal  Result Value Ref Range Status   MRSA by PCR NEGATIVE NEGATIVE Final    Comment:        The GeneXpert MRSA Assay (FDA approved for NASAL specimens only), is one component of a comprehensive MRSA colonization surveillance program. It is not intended to diagnose MRSA infection nor to guide or monitor treatment for MRSA infections. Performed at [facility], [address] [place], [place] [address]          Radiology Studies: DG Chest 1 View  Result Date: 03/17/2020 CLINICAL DATA:  COVID-19 pneumonia EXAM: CHEST  1 VIEW COMPARISON:  03/14/2020 FINDINGS: Lung volumes are small, but are symmetric and are stable since prior examination. Diffuse bilateral nodular airspace infiltrate is stable when compared to prior  examination. No pneumothorax or pleural effusion. Cardiac size within normal limits. No acute bone abnormality. IMPRESSION: Stable pulmonary hypoinflation. Stable diffuse pulmonary infiltrate, likely infectious or inflammatory. Electronically Signed   By: Fidela Salisbury MD   On: 03/17/2020 06:32        Scheduled Meds: . Chlorhexidine Gluconate Cloth  6 each Topical Daily  . enoxaparin (LOVENOX) injection  40 mg Subcutaneous Q24H  . insulin aspart  0-15 Units Subcutaneous Q4H  . insulin glargine  12 Units Subcutaneous QHS  . Ipratropium-Albuterol  1 puff Inhalation TID  . methylPREDNISolone (SOLU-MEDROL) injection  60 mg Intravenous Q12H  . metoprolol tartrate  2.5 mg Intravenous Q8H  . ondansetron (ZOFRAN) IV  4 mg Intravenous Q6H  . pantoprazole  40 mg Oral BID AC   Continuous Infusions: . sodium chloride       LOS: 11 days    Time spent: 35 minutes    Calvin Lindor Darleen Crocker, DO [facility]  If 7PM-7AM,  please contact night-coverage [email or web address] 03/17/2020, 10:04 AM

## 2020-03-18 ENCOUNTER — Inpatient Hospital Stay (HOSPITAL_COMMUNITY): Payer: Commercial Managed Care - PPO

## 2020-03-18 DIAGNOSIS — K56600 Partial intestinal obstruction, unspecified as to cause: Secondary | ICD-10-CM | POA: Diagnosis not present

## 2020-03-18 LAB — COMPREHENSIVE METABOLIC PANEL
ALT: 21 U/L (ref 0–44)
AST: 25 U/L (ref 15–41)
Albumin: 2.5 g/dL — ABNORMAL LOW (ref 3.5–5.0)
Alkaline Phosphatase: 65 U/L (ref 38–126)
Anion gap: 13 (ref 5–15)
BUN: 16 mg/dL (ref 8–23)
CO2: 23 mmol/L (ref 22–32)
Calcium: 8.1 mg/dL — ABNORMAL LOW (ref 8.9–10.3)
Chloride: 90 mmol/L — ABNORMAL LOW (ref 98–111)
Creatinine, Ser: 0.58 mg/dL — ABNORMAL LOW (ref 0.61–1.24)
GFR, Estimated: >60 mL/min (ref >60)
Glucose, Bld: 184 mg/dL — ABNORMAL HIGH (ref 70–99)
Potassium: 4.3 mmol/L (ref 3.5–5.1)
Sodium: 126 mmol/L — ABNORMAL LOW (ref 135–145)
Total Bilirubin: 1.8 mg/dL — ABNORMAL HIGH (ref 0.3–1.2)
Total Protein: 6 g/dL — ABNORMAL LOW (ref 6.5–8.1)

## 2020-03-18 LAB — GLUCOSE, CAPILLARY
Glucose-Capillary: 196 mg/dL — ABNORMAL HIGH (ref 70–99)
Glucose-Capillary: 182 mg/dL — ABNORMAL HIGH (ref 70–99)
Glucose-Capillary: 176 mg/dL — ABNORMAL HIGH (ref 70–99)
Glucose-Capillary: 228 mg/dL — ABNORMAL HIGH (ref 70–99)
Glucose-Capillary: 283 mg/dL — ABNORMAL HIGH (ref 70–99)

## 2020-03-18 LAB — CBC
HCT: 47.1 % (ref 39.0–52.0)
Hemoglobin: 16.6 g/dL (ref 13.0–17.0)
MCH: 31.2 pg (ref 26.0–34.0)
MCHC: 35.2 g/dL (ref 30.0–36.0)
MCV: 88.5 fL (ref 80.0–100.0)
Platelets: 223 10*3/uL (ref 150–400)
RBC: 5.32 MIL/uL (ref 4.22–5.81)
RDW: 11.3 % — ABNORMAL LOW (ref 11.5–15.5)
WBC: 19.4 10*3/uL — ABNORMAL HIGH (ref 4.0–10.5)
nRBC: 0 % (ref 0.0–0.2)

## 2020-03-18 LAB — MAGNESIUM: Magnesium: 1.9 mg/dL (ref 1.7–2.4)

## 2020-03-18 LAB — FERRITIN: Ferritin: 1116 ng/mL — ABNORMAL HIGH (ref 24–336)

## 2020-03-18 LAB — C-REACTIVE PROTEIN: CRP: 17.6 mg/dL — ABNORMAL HIGH (ref <1.0)

## 2020-03-18 LAB — D-DIMER, QUANTITATIVE: D-Dimer, Quant: 13.41 ug/mL-FEU — ABNORMAL HIGH (ref 0.00–0.50)

## 2020-03-18 MED ORDER — LORAZEPAM 2 MG/ML IJ SOLN
0.5000 mg | INTRAMUSCULAR | Status: DC | PRN
  Administered 2020-03-18: 0.5 mg via INTRAVENOUS
  Filled 2020-03-18: qty 1

## 2020-03-18 MED ORDER — IOHEXOL 350 MG/ML SOLN
100.0000 mL | Freq: Once | INTRAVENOUS | Status: AC | PRN
  Administered 2020-03-18: 100 mL via INTRAVENOUS

## 2020-03-18 MED ORDER — FENTANYL CITRATE (PF) 100 MCG/2ML IJ SOLN
75.0000 ug | INTRAMUSCULAR | Status: DC | PRN
  Administered 2020-03-18: 75 ug via INTRAVENOUS

## 2020-03-18 MED ORDER — METHYLPREDNISOLONE SODIUM SUCC 125 MG IJ SOLR
90.0000 mg | Freq: Two times a day (BID) | INTRAMUSCULAR | Status: DC
  Administered 2020-03-18 – 2020-03-29 (×23): 90 mg via INTRAVENOUS
  Filled 2020-03-18 (×22): qty 2

## 2020-03-18 NOTE — Progress Notes (Signed)
Discussed with Dr. Manuella Ghazi (hospitalist). Patient eating, having bowel movements and flatus. SBO seems resolved at this time. Main care component now is COVID-19.  We will sign off for now, will remove consult order. Please contact us and enter a new consult order if we can be of further assistance.   Thank you for allowing [place] to participate in the care of Calvin Cruz  Walden Field, DNP, AGNP-C Adult & Gerontological Nurse Practitioner [facility]

## 2020-03-18 NOTE — Progress Notes (Signed)
Physical Therapy Treatment Patient Details Name: Calvin Cruz MRN: [id number] DOB: 07-19-1960 Today's Date: 03/18/2020    History of Present Illness Calvin Cruz is a 60 y.o. male with medical history significant for hx of ulcerative colitis, s/p colectomy with Hartman's pouch formation,  gout, NSTEMI, CAD s/p DES June 2016, hypertension and hyperlipidemia who presents to the emergency department due to 1 day onset of lower abdominal pain with associated nausea and vomiting.  Pain was described as sharp, intermittent and rated as 7-8/10 on pain scale, he also complained of nonbloody but bilious like vomiting of several episodes since onset of symptoms.  No alleviating/aggravating factors known, symptoms are similar to prior small bowel obstruction (last SBO was about a year ago per patient).  Last bowel movement was yesterday (1/15) in the morning.  He denies fever, chills, chest pain, shortness of breath, diarrhea or any sick contact.  Patient states that he never had COVID-vaccine    PT Comments    Patient demonstrates slow labored movement for sitting up at bedside, fair/good return for completing BLE ROM/strengthening exercises, required active assistance to complete hip raises with LLE due to weakness, able to take steps forward/backwards at bedside for up to 4-5 minutes before having to sit due to fatigue while on high flow O2.  Patient requested to go back to bed after therapy due to fatigue.  Patient's spouse present during visit.  Patient will benefit from continued physical therapy in hospital and recommended venue below to increase strength, balance, endurance for safe ADLs and gait.     Follow Up Recommendations  Home health PT;Supervision for mobility/OOB;Supervision - Intermittent     Equipment Recommendations  Rolling walker with 5" wheels    Recommendations for Other Services       Precautions / Restrictions Precautions Precautions: Fall Restrictions Weight Bearing  Restrictions: No    Mobility  Bed Mobility Overal bed mobility: Needs Assistance Bed Mobility: Supine to Sit;Sit to Supine     Supine to sit: Min assist Sit to supine: Supervision   General bed mobility comments: difficulty with supine to sitting due to generalized weakness  Transfers Overall transfer level: Needs assistance Equipment used: Rolling walker (2 wheeled) Transfers: Sit to/from [facility] Sit to Stand: Min guard;Min assist Stand pivot transfers: Min guard;Min assist       General transfer comment: slow labored movement  Ambulation/Gait Ambulation/Gait assistance: Min assist;Mod assist Gait Distance (Feet): 18 Feet Assistive device: Rolling walker (2 wheeled) Gait Pattern/deviations: Decreased step length - right;Decreased step length - left;Decreased stride length Gait velocity: decreased   General Gait Details: slow labored movement taking steps forward/backwards at bedside, limited mostly due to fatigue and diffiuclty breathing with SpO2 at 91% while on 30% HFNC   Stairs             Wheelchair Mobility    Modified Rankin (Stroke Patients Only)       Balance Overall balance assessment: Needs assistance Sitting-balance support: Feet supported;No upper extremity supported Sitting balance-Leahy Scale: Good Sitting balance - Comments: seated at EOB   Standing balance support: During functional activity;Bilateral upper extremity supported Standing balance-Leahy Scale: Fair Standing balance comment: using RW                            Cognition Arousal/Alertness: Awake/alert Behavior During Therapy: WFL for tasks assessed/performed Overall Cognitive Status: Within Functional Limits for tasks assessed  Exercises General Exercises - Lower Extremity Long Arc Quad: Seated;AROM;Strengthening;Both;10 reps Hip Flexion/Marching: Seated;AROM;Strengthening;AAROM;Both;10  reps Toe Raises: Seated;AROM;Strengthening;Both;15 reps Heel Raises: Seated;AROM;Strengthening;Both;15 reps    General Comments        Pertinent Vitals/Pain Pain Assessment: No/denies pain    Home Living                      Prior Function            PT Goals (current goals can now be found in the care plan section) Acute Rehab PT Goals Patient Stated Goal: return home with family to assist PT Goal Formulation: With patient Time For Goal Achievement: 03/23/20 Potential to Achieve Goals: Good Progress towards PT goals: Progressing toward goals    Frequency    Min 3X/week      PT Plan      Co-evaluation              AM-PAC PT "6 Clicks" Mobility   Outcome Measure  Help needed turning from your back to your side while in a flat bed without using bedrails?: None Help needed moving from lying on your back to sitting on the side of a flat bed without using bedrails?: A Little Help needed moving to and from a bed to a chair (including a wheelchair)?: A Little Help needed standing up from a chair using your arms (e.g., wheelchair or bedside chair)?: A Little Help needed to walk in hospital room?: A Lot Help needed climbing 3-5 steps with a railing? : A Lot 6 Click Score: 17    End of Session Equipment Utilized During Treatment: Oxygen Activity Tolerance: Patient tolerated treatment well;Patient limited by fatigue Patient left: in chair;with call bell/phone within reach Nurse Communication: Mobility status PT Visit Diagnosis: Unsteadiness on feet (R26.81);Other abnormalities of gait and mobility (R26.89);Muscle weakness (generalized) (M62.81)     Time: 8638-1771 PT Time Calculation (min) (ACUTE ONLY): 20 min  Charges:  $Therapeutic Exercise: 8-22 mins $Therapeutic Activity: 8-22 mins                     4:40 PM, 03/18/20 Lonell Grandchild, MPT Physical Therapist with [facility] 336 [phone] office [phone] mobile phone

## 2020-03-18 NOTE — Progress Notes (Signed)
PROGRESS NOTE    Calvin Cruz  [id number] DOB: 11-Mar-1960 DOA: 03/13/2020 PCP: Reynold Bowen, MD   Brief Narrative:  60 year old male with a history ofsecondarypolycythemia(JAK2 neg), hyperlipidemia, diabetes mellitus type 2, hypertension, coronary artery disease status post NSTEMI with DES to the circumflex June 2016, ulcerative colitis status post proctocolectomy with ileoanal anastomosis, and recurrent episodes of pouchitis presenting with upper abdominal pain, nausea, vomiting, and loose stools that began on 03/04/2020. He states that he normally has 5-6 loose bowel movements on average day secondary to his ulcerative colitis. He normally uses Imodium, usually 1 to 2 tablets on a daily basis. His last bowel movement was on 03/10/2020. He denies any fevers, chills, chest pain, shortness of breath, cough, hemoptysis. He denies any headache, sore throat, hemoptysis. He has had numerous episodes of emesis on 03/15/2020 resulting in dry heaving. There is no hematemesis. He denies hematochezia or melena.He has not had a bowel movement since the afternoon of 03/12/2020. In the emergency department, the patient was afebrile hemodynamically stable with oxygen saturation 94-96% on room air. BMP showed a sodium 135, potassium 4.1, BUN 14, serum creatinine 0.81. LFTs were unremarkable. Lipase 21. WBC 7.1, hemoglobin 17.7, platelets 174,000. CT of the abdomen and pelvis showed mildly dilated air-fluid filled loops of ileum up to 3.2 cm. There was no clear transition point or focal narrowing. There is no surrounding inflammatory changes. He is status post colectomy with ileal colonic anastomosis.   Assessment & Plan:   Principal Problem:   Partial small bowel obstruction (HCC) Active Problems:   Hyperlipidemia with target LDL less than 70   Essential hypertension   CAD S/P DES PCI-circumflex   Abdominal pain   COVID-19 virus infection   Hyperglycemia due to diabetes mellitus  (HCC)   Nausea and vomiting   Gastroenteritis due to COVID-19 virus   Abdominal distention   Hypoxemia   SOB (shortness of breath)   Partial small bowel obstruction-resolved -Patient currently on regular diet and tolerating -continue judicious use of narcotics; goal is not for pain free; but to assist tolerating symptoms.  -Continue the use of levsin as needed . -Continue PRN antiemetics.  -Will continue to follow recommendations by GI service prn -Patient also with improvement in his distention, having bowel sounds and had experienced multiple bowel movements overnight. No nausea or vomiting currently. -Patient has been instructed to increase physical activity.  Acute respiratory failure with hypoxia due toCOVID-19pneumonia;patient also with COVID-19gastroenteritis/Infection -Patient was found to beincidentallyCOVID-19 positive by RT-PCR -Withcontinue to followinflammatory markers-still elevated -Remdesivir completed, continues on steroids, increased dose to 1 mg/kg on 1/28 -Initiated baricitinib 1/27 -continuePPI. -Patient has not been vaccinated -Appreciate GI consultation; simethicone to assist with over tympanic and obstipation symptoms.  -Continue tomonitor renal function and LFTs. -Repeat chest x-rayon1/24/2022 demonstrating diffuse bilateral infiltrates/opacities consistent with Covid infection. -Following family request,pulmonology service has been consulted to further evaluate patient's care. -Procalcitonin low and repeat chest x-ray stable on 1/27 -Elevated D-dimers noted on 1/28 for which CT angiogram for PE study ordered -Plan to allow wife to visit to increase mobilization  Hyponatremia-worsening -TSH within normal limits and serum osmolarity 272 -Plan to fluid restrict -Changed to regular diet -IV normal saline added -Monitor in a.m.  Coronary artery disease with history of NSTEMI -No chest pain presently -Restart Brilinta when able to fully  tolerate p.o. -Patient follows with Dr. Glenetta Hew -Beta-blocker has been resumed.  Diabetes mellitus type 2, controlled -Restart Jardiance outpatient -Continue sliding scale insulin while inpatient. -03/07/20 A1C--6.7 -  Treatment for COVID infection using the steroids has been started; CBGs expected to get elevated -Will add low-dose Lantus to assist with management.  Hyperlipidemia -Restart Crestor and Vascepa once able to tolerate p.o.  Pituitary Tumor  -Found in 08/28/16 Brain MRI  -Will continue outpatient follow up with Dr. Forde Dandy.   Status is: Inpatient  Remains inpatient appropriate because: IV treatments appropriate due to intensity of illness or inability to take PO   Dispo: The patient is from:Home Anticipated d/c is RC:VELF Anticipated d/c date is:To be determined Patient currently is not medically stable to d/c.   Family Communication:spouse updated 03/18/20  Consultants:GI, general surgery  Code Status: FULL  DVT Prophylaxis: [place] Lovenox   Procedures: As Listed in Progress Note Above  Antimicrobials:  Anti-infectives (From admission, onward)   Start     Dose/Rate Route Frequency Ordered Stop   03/11/20 1000  remdesivir 100 mg in sodium chloride 0.9 % 100 mL IVPB        100 mg 200 mL/hr over 30 Minutes Intravenous Daily 03/10/20 1105 03/14/20 0900   03/10/20 1200  remdesivir 100 mg in sodium chloride 0.9 % 100 mL IVPB        100 mg 200 mL/hr over 30 Minutes Intravenous Every 1 hr x 2 03/10/20 1105 03/10/20 1320       Subjective: Patient seen and evaluated today with no new acute complaints or concerns. No acute concerns or events noted overnight.  He continues have some periods of anxiety as well as ongoing low back pain and refuses to do much movement or use his incentive spirometry.  Objective: Vitals:   03/18/20 0100 03/18/20 0151 03/18/20 0200 03/18/20 0826  BP: (!) 141/73   129/73   Pulse: (!) 114  (!) 115   Resp: (!) 32  (!) 24   Temp:  98.2 F (36.8 C)  (!) 97.5 F (36.4 C)  TempSrc:    Axillary  SpO2: (!) 87%  94%   Weight:      Height:        Intake/Output Summary (Last 24 hours) at 03/18/2020 1101 Last data filed at 03/18/2020 0600 Gross per 24 hour  Intake --  Output 975 ml  Net -975 ml   Filed Weights   03/14/20 0348 03/15/20 0500 03/17/20 0300  Weight: 97.9 kg 97.5 kg 92.7 kg    Examination:  General exam: Appears calm and comfortable  Respiratory system: Clear to auscultation. Respiratory effort normal.  Currently on 40 L heated high flow nasal cannula. Cardiovascular system: S1 & S2 heard, RRR.  Tachycardic Gastrointestinal system: Abdomen is soft Central nervous system: Alert and awake Extremities: No edema Skin: No significant lesions noted Psychiatry: Flat affect.    Data Reviewed: I have personally reviewed following labs and imaging studies  CBC: Recent Labs  Lab 03/13/20 0421 03/14/20 0812 03/15/20 0346 03/17/20 0435 03/18/20 0523  WBC 13.8* 17.9* 13.6* 17.3* 19.4*  HGB 16.3 17.3* 16.6 17.0 16.6  HCT 46.4 49.6 46.6 48.0 47.1  MCV 91.5 90.8 88.3 88.2 88.5  PLT 205 231 227 270 810   Basic Metabolic Panel: Recent Labs  Lab 03/13/20 0421 03/14/20 0812 03/15/20 0346 03/17/20 0435 03/18/20 0523  NA 137 133* 131* 127* 126*  K 4.1 4.4 4.5 4.3 4.3  CL 101 98 96* 89* 90*  CO2 27 27 25 26 23   GLUCOSE 238* 162* 165* 149* 184*  BUN 15 13 13 14 16   CREATININE 0.64 0.57* 0.51* 0.51* 0.58*  CALCIUM 8.6* 8.5*  8.3* 8.6* 8.1*  MG  --   --   --  1.9 1.9   GFR: Estimated Creatinine Clearance: 125.4 mL/min (A) (by C-G formula based on SCr of 0.58 mg/dL (L)). Liver Function Tests: Recent Labs  Lab 03/13/20 0421 03/14/20 0812 03/15/20 0346 03/17/20 0435 03/18/20 0523  AST 25 30 29 21 25   ALT 20 18 17 17 21   ALKPHOS 36* 49 52 64 65  BILITOT 1.0 1.4* 1.6* 1.8* 1.8*  PROT 5.9* 6.2* 5.9* 6.3* 6.0*  ALBUMIN 3.1*  3.0* 2.8* 2.7* 2.5*   No results for input(s): LIPASE, AMYLASE in the last 168 hours. No results for input(s): AMMONIA in the last 168 hours. Coagulation Profile: No results for input(s): INR, PROTIME in the last 168 hours. Cardiac Enzymes: No results for input(s): CKTOTAL, CKMB, CKMBINDEX, TROPONINI in the last 168 hours. BNP (last 3 results) No results for input(s): PROBNP in the last 8760 hours. HbA1C: No results for input(s): HGBA1C in the last 72 hours. CBG: Recent Labs  Lab 03/17/20 1653 03/17/20 1947 03/17/20 2259 03/18/20 0605 03/18/20 0823  GLUCAP 122* 129* 100* 196* 182*   Lipid Profile: No results for input(s): CHOL, HDL, LDLCALC, TRIG, CHOLHDL, LDLDIRECT in the last 72 hours. Thyroid Function Tests: Recent Labs    03/17/20 1036  TSH 0.951   Anemia Panel: Recent Labs    03/17/20 0435 03/18/20 0523  FERRITIN 1,305* 1,116*   Sepsis Labs: Recent Labs  Lab 03/16/20 1454  PROCALCITON <0.10    Recent Results (from the past 240 hour(s))  MRSA PCR Screening     Status: None   Collection Time: 03/10/20 12:47 AM   Specimen: Nasal Mucosa; Nasopharyngeal  Result Value Ref Range Status   MRSA by PCR NEGATIVE NEGATIVE Final    Comment:        The GeneXpert MRSA Assay (FDA approved for NASAL specimens only), is one component of a comprehensive MRSA colonization surveillance program. It is not intended to diagnose MRSA infection nor to guide or monitor treatment for MRSA infections. Performed at [facility], [address], [place], [place] [address]          Radiology Studies: DG Chest 1 View  Result Date: 03/17/2020 CLINICAL DATA:  COVID-19 pneumonia EXAM: CHEST  1 VIEW COMPARISON:  03/14/2020 FINDINGS: Lung volumes are small, but are symmetric and are stable since prior examination. Diffuse bilateral nodular airspace infiltrate is stable when compared to prior examination. No pneumothorax or pleural effusion. Cardiac size within normal limits. No  acute bone abnormality. IMPRESSION: Stable pulmonary hypoinflation. Stable diffuse pulmonary infiltrate, likely infectious or inflammatory. Electronically Signed   By: Fidela Salisbury MD   On: 03/17/2020 06:32        Scheduled Meds: . baricitinib  4 mg Oral Daily  . Chlorhexidine Gluconate Cloth  6 each Topical Daily  . enoxaparin (LOVENOX) injection  40 mg Subcutaneous Q24H  . insulin aspart  0-15 Units Subcutaneous Q4H  . insulin glargine  12 Units Subcutaneous QHS  . Ipratropium-Albuterol  1 puff Inhalation TID  . methylPREDNISolone (SOLU-MEDROL) injection  90 mg Intravenous Q12H  . metoprolol tartrate  2.5 mg Intravenous Q8H  . ondansetron (ZOFRAN) IV  4 mg Intravenous Q6H  . pantoprazole  40 mg Oral BID AC   Continuous Infusions: . sodium chloride 75 mL/hr at 03/17/20 1134     LOS: 12 days    Time spent: 35 minutes    Espen Bethel Darleen Crocker, DO [facility]  If 7PM-7AM, please contact  night-coverage [email or web address] 03/18/2020, 11:01 AM

## 2020-03-19 DIAGNOSIS — K56600 Partial intestinal obstruction, unspecified as to cause: Secondary | ICD-10-CM | POA: Diagnosis not present

## 2020-03-19 LAB — GLUCOSE, CAPILLARY
Glucose-Capillary: 219 mg/dL — ABNORMAL HIGH (ref 70–99)
Glucose-Capillary: 170 mg/dL — ABNORMAL HIGH (ref 70–99)
Glucose-Capillary: 173 mg/dL — ABNORMAL HIGH (ref 70–99)
Glucose-Capillary: 170 mg/dL — ABNORMAL HIGH (ref 70–99)
Glucose-Capillary: 193 mg/dL — ABNORMAL HIGH (ref 70–99)
Glucose-Capillary: 226 mg/dL — ABNORMAL HIGH (ref 70–99)
Glucose-Capillary: 135 mg/dL — ABNORMAL HIGH (ref 70–99)

## 2020-03-19 LAB — C-REACTIVE PROTEIN: CRP: 11.7 mg/dL — ABNORMAL HIGH (ref <1.0)

## 2020-03-19 LAB — CBC
HCT: 45.2 % (ref 39.0–52.0)
Hemoglobin: 16.3 g/dL (ref 13.0–17.0)
MCH: 31.7 pg (ref 26.0–34.0)
MCHC: 36.1 g/dL — ABNORMAL HIGH (ref 30.0–36.0)
MCV: 87.9 fL (ref 80.0–100.0)
Platelets: 238 10*3/uL (ref 150–400)
RBC: 5.14 MIL/uL (ref 4.22–5.81)
RDW: 11.2 % — ABNORMAL LOW (ref 11.5–15.5)
WBC: 22.8 10*3/uL — ABNORMAL HIGH (ref 4.0–10.5)
nRBC: 0 % (ref 0.0–0.2)

## 2020-03-19 LAB — COMPREHENSIVE METABOLIC PANEL
ALT: 18 U/L (ref 0–44)
AST: 20 U/L (ref 15–41)
Albumin: 2.5 g/dL — ABNORMAL LOW (ref 3.5–5.0)
Alkaline Phosphatase: 67 U/L (ref 38–126)
Anion gap: 8 (ref 5–15)
BUN: 14 mg/dL (ref 8–23)
CO2: 26 mmol/L (ref 22–32)
Calcium: 8.2 mg/dL — ABNORMAL LOW (ref 8.9–10.3)
Chloride: 94 mmol/L — ABNORMAL LOW (ref 98–111)
Creatinine, Ser: 0.47 mg/dL — ABNORMAL LOW (ref 0.61–1.24)
GFR, Estimated: >60 mL/min (ref >60)
Glucose, Bld: 160 mg/dL — ABNORMAL HIGH (ref 70–99)
Potassium: 4.2 mmol/L (ref 3.5–5.1)
Sodium: 128 mmol/L — ABNORMAL LOW (ref 135–145)
Total Bilirubin: 1.5 mg/dL — ABNORMAL HIGH (ref 0.3–1.2)
Total Protein: 6.1 g/dL — ABNORMAL LOW (ref 6.5–8.1)

## 2020-03-19 LAB — FERRITIN: Ferritin: 1354 ng/mL — ABNORMAL HIGH (ref 24–336)

## 2020-03-19 LAB — D-DIMER, QUANTITATIVE: D-Dimer, Quant: 7.54 ug/mL-FEU — ABNORMAL HIGH (ref 0.00–0.50)

## 2020-03-19 MED ORDER — FENTANYL CITRATE (PF) 100 MCG/2ML IJ SOLN
75.0000 ug | INTRAMUSCULAR | Status: DC | PRN
  Administered 2020-03-19 (×2): 75 ug via INTRAVENOUS
  Filled 2020-03-19 (×3): qty 2

## 2020-03-19 MED ORDER — VITAMIN D 25 MCG (1000 UNIT) PO TABS
5000.0000 [IU] | ORAL_TABLET | Freq: Every day | ORAL | Status: DC
  Administered 2020-03-19 – 2020-03-27 (×6): 5000 [IU] via ORAL
  Filled 2020-03-19 (×8): qty 5

## 2020-03-19 MED ORDER — ZINC SULFATE 220 (50 ZN) MG PO CAPS
220.0000 mg | ORAL_CAPSULE | Freq: Every day | ORAL | Status: DC
  Administered 2020-03-19 – 2020-03-27 (×6): 220 mg via ORAL
  Filled 2020-03-19 (×8): qty 1

## 2020-03-19 MED ORDER — FENTANYL CITRATE (PF) 100 MCG/2ML IJ SOLN
100.0000 ug | INTRAMUSCULAR | Status: DC | PRN
  Administered 2020-03-19: 100 ug via INTRAVENOUS
  Filled 2020-03-19: qty 2

## 2020-03-19 MED ORDER — ASCORBIC ACID 500 MG PO TABS
1000.0000 mg | ORAL_TABLET | Freq: Every day | ORAL | Status: DC
  Administered 2020-03-19 – 2020-03-27 (×6): 1000 mg via ORAL
  Filled 2020-03-19 (×8): qty 2

## 2020-03-19 MED ORDER — HALOPERIDOL LACTATE 5 MG/ML IJ SOLN
2.0000 mg | Freq: Four times a day (QID) | INTRAMUSCULAR | Status: DC | PRN
  Administered 2020-03-26: 3 mg via INTRAVENOUS
  Administered 2020-03-26 – 2020-03-27 (×2): 2 mg via INTRAVENOUS
  Filled 2020-03-19 (×2): qty 1

## 2020-03-19 MED ORDER — TAB-A-VITE/IRON PO TABS
1.0000 | ORAL_TABLET | Freq: Every day | ORAL | Status: DC
  Filled 2020-03-19 (×3): qty 1

## 2020-03-19 NOTE — Progress Notes (Signed)
Restraints removed this morning at approximately 0800. Pt is mostly back to baseline this morning/afternoon. Pt still intermittently confused and removing oxygen equipment. When [place] and NRB are removed, pt desats to 30-40s%. Education, reorientation, and reapplying [place] and NRB multiple times this shift. The importance of proning, lying on his side, getting out of bed and into the chair, and IS continue to be reinforced. I spoke with the pt's wife this afternoon and she expressed that the fentanyl is what is causing him to not improve. She has asked that nursing try to spread fentanyl out more than Q4 to help him wean off narcotic use.

## 2020-03-19 NOTE — Progress Notes (Signed)
PROGRESS NOTE    Calvin Cruz  RUE:[id number] DOB: 05/03/1960 DOA: 03-30-2020 PCP: Reynold Bowen, MD   Brief Narrative:  60 year old male with a history ofsecondarypolycythemia(JAK2 neg), hyperlipidemia, diabetes mellitus type 2, hypertension, coronary artery disease status post NSTEMI with DES to the circumflex June 2016, ulcerative colitis status post proctocolectomy with ileoanal anastomosis, and recurrent episodes of pouchitis presenting with upper abdominal pain, nausea, vomiting, and loose stools that began on 03/04/2020. He states that he normally has 5-6 loose bowel movements on average day secondary to his ulcerative colitis. He normally uses Imodium, usually 1 to 2 tablets on a daily basis. His last bowel movement was on 03-30-20. He denies any fevers, chills, chest pain, shortness of breath, cough, hemoptysis. He denies any headache, sore throat, hemoptysis. He has had numerous episodes of emesis on 2020-03-30 resulting in dry heaving. There is no hematemesis. He denies hematochezia or melena.He has not had a bowel movement since the afternoon of 03/12/2020. In the emergency department, the patient was afebrile hemodynamically stable with oxygen saturation 94-96% on room air. BMP showed a sodium 135, potassium 4.1, BUN 14, serum creatinine 0.81. LFTs were unremarkable. Lipase 21. WBC 7.1, hemoglobin 17.7, platelets 174,000. CT of the abdomen and pelvis showed mildly dilated air-fluid filled loops of ileum up to 3.2 cm. There was no clear transition point or focal narrowing. There is no surrounding inflammatory changes. He is status post colectomy with ileal colonic anastomosis.  Assessment & Plan:   Principal Problem:   Partial small bowel obstruction (HCC) Active Problems:   Hyperlipidemia with target LDL less than 70   Essential hypertension   CAD S/P DES PCI-circumflex   Abdominal pain   COVID-19 virus infection   Hyperglycemia due to diabetes mellitus  (HCC)   Nausea and vomiting   Gastroenteritis due to COVID-19 virus   Abdominal distention   Hypoxemia   SOB (shortness of breath)  Partial small bowel obstruction-resolved -Patient currently onregular diet and tolerating -continue judicious use of narcotics; goal is not for pain free; but to assist tolerating symptoms.  -Continue the use of levsin as needed . -Continue PRN antiemetics.  -Will continue to follow recommendations by GI serviceprn -Patient also with improvement in his distention, having bowel sounds and had experienced multiple bowel movements overnight. No nausea or vomiting currently. -Patient has been instructed to increase physical activity. -Plan to slowly wean fentanyl use  Acute respiratory failure with hypoxia due toCOVID-19pneumonia;patient also with COVID-19gastroenteritis/Infection -Patient was found to beincidentallyCOVID-19 positive by RT-PCR -Withcontinue to followinflammatory markers-still elevated -Remdesivir completed, continues on steroids, increased dose to 1 mg/kg on 1/28 -Initiated baricitinib 1/27 -continuePPI. -Patient has not been vaccinated -Appreciate GI consultation; simethicone to assist with over tympanic and obstipation symptoms.  -Continue tomonitor renal function and LFTs. -Repeat chest x-rayon1/24/2022 demonstrating diffuse bilateral infiltrates/opacities consistent with Covid infection. -Following family request,pulmonology service has been consulted to further evaluate patient's care. -Procalcitonin lowand repeat chest x-ray stable on 1/27 -Elevated D-dimers noted on 1/28 for which CT angiogram for PE study ordered-negative -Plan to allow wife to visit to increase mobilization  Delirium/agitation -Haldol ordered as needed -Continues in restraints currently  Hyponatremia -TSH within normal limits and serum osmolarity 272 -Plan to fluid restrict -Changed to regular diet -IV normal saline added -Monitor in  a.m.  Coronary artery disease with history of NSTEMI -No chest pain presently -Restart Brilinta when able to fully tolerate p.o. -Patient follows with Dr. Glenetta Hew -Beta-blocker has been resumed.  Diabetes mellitus type 2, controlled -  Restart Jardianceoutpatient -Continue sliding scale insulin while inpatient. -03/07/20 A1C--6.7 -Treatment for COVID infection using the steroids has been started; CBGs expected to get elevated -Will add low-dose Lantus to assist with management.  Hyperlipidemia -Restart Crestor and Vascepa once able to tolerate p.o.  Pituitary Tumor  -Found in 08/28/16 Brain MRI  -Will continue outpatient follow up with Dr. Forde Dandy.   Status is: Inpatient  Remains inpatient appropriate because: IV treatments appropriate due to intensity of illness or inability to take PO   Dispo: The patient is from:Home Anticipated d/c is GH:WEXH Anticipated d/c date is:To be determined Patient currently is not medically stable to d/c.   Family Communication:spouse updated 03/19/20  Consultants:GI, general surgery  Code Status: FULL  DVT Prophylaxis: [place] Lovenox   Procedures: As Listed in Progress Note Above  Antimicrobials:  Anti-infectives (From admission, onward)   Start     Dose/Rate Route Frequency Ordered Stop   03/11/20 1000  remdesivir 100 mg in sodium chloride 0.9 % 100 mL IVPB        100 mg 200 mL/hr over 30 Minutes Intravenous Daily 03/10/20 1105 03/14/20 0900   03/10/20 1200  remdesivir 100 mg in sodium chloride 0.9 % 100 mL IVPB        100 mg 200 mL/hr over 30 Minutes Intravenous Every 1 hr x 2 03/10/20 1105 03/10/20 1320       Subjective: Patient seen and evaluated today with ongoing severe hypoxemia.  He was noted to have some significant agitation overnight and had pulled off his supplemental oxygen and had significant desaturation.  He is in restraints this  morning.  Objective: Vitals:   03/19/20 0932 03/19/20 1000 03/19/20 1100 03/19/20 1106  BP:  (!) 160/77    Pulse:  (!) 102 (!) 123 (!) 121  Resp:  (!) 26 (!) 22 (!) 37  Temp:    (!) 97.5 F (36.4 C)  TempSrc:    Oral  SpO2: 91% 90% (!) 86% (!) 89%  Weight:      Height:        Intake/Output Summary (Last 24 hours) at 03/19/2020 1203 Last data filed at 03/19/2020 0600 Gross per 24 hour  Intake 1000 ml  Output 300 ml  Net 700 ml   Filed Weights   03/15/20 0500 03/17/20 0300 03/19/20 0500  Weight: 97.5 kg 92.7 kg 92.7 kg    Examination:  General exam: Appears calm and comfortable  Respiratory system: Clear to auscultation. Respiratory effort normal.  Currently on 40 L heated high flow nasal cannula with nonrebreather mask. Cardiovascular system: S1 & S2 heard, RRR. Tachycardic. Gastrointestinal system: Abdomen is soft Central nervous system: Alert and awake Extremities: No edema Skin: No significant lesions noted Psychiatry: Flat affect.    Data Reviewed: I have personally reviewed following labs and imaging studies  CBC: Recent Labs  Lab 03/14/20 0812 03/15/20 0346 03/17/20 0435 03/18/20 0523 03/19/20 0631  WBC 17.9* 13.6* 17.3* 19.4* 22.8*  HGB 17.3* 16.6 17.0 16.6 16.3  HCT 49.6 46.6 48.0 47.1 45.2  MCV 90.8 88.3 88.2 88.5 87.9  PLT 231 227 270 223 371   Basic Metabolic Panel: Recent Labs  Lab 03/14/20 0812 03/15/20 0346 03/17/20 0435 03/18/20 0523 03/19/20 0631  NA 133* 131* 127* 126* 128*  K 4.4 4.5 4.3 4.3 4.2  CL 98 96* 89* 90* 94*  CO2 27 25 26 23 26   GLUCOSE 162* 165* 149* 184* 160*  BUN 13 13 14 16 14   CREATININE 0.57* 0.51* 0.51* 0.58*  0.47*  CALCIUM 8.5* 8.3* 8.6* 8.1* 8.2*  MG  --   --  1.9 1.9  --    GFR: Estimated Creatinine Clearance: 125.4 mL/min (A) (by C-G formula based on SCr of 0.47 mg/dL (L)). Liver Function Tests: Recent Labs  Lab 03/14/20 0812 03/15/20 0346 03/17/20 0435 03/18/20 0523 03/19/20 0631  AST 30 29 21 25  20   ALT 18 17 17 21 18   ALKPHOS 49 52 64 65 67  BILITOT 1.4* 1.6* 1.8* 1.8* 1.5*  PROT 6.2* 5.9* 6.3* 6.0* 6.1*  ALBUMIN 3.0* 2.8* 2.7* 2.5* 2.5*   No results for input(s): LIPASE, AMYLASE in the last 168 hours. No results for input(s): AMMONIA in the last 168 hours. Coagulation Profile: No results for input(s): INR, PROTIME in the last 168 hours. Cardiac Enzymes: No results for input(s): CKTOTAL, CKMB, CKMBINDEX, TROPONINI in the last 168 hours. BNP (last 3 results) No results for input(s): PROBNP in the last 8760 hours. HbA1C: No results for input(s): HGBA1C in the last 72 hours. CBG: Recent Labs  Lab 03/18/20 1949 03/19/20 0008 03/19/20 0528 03/19/20 0757 03/19/20 1105  GLUCAP 283* 219* 170* 173* 170*   Lipid Profile: No results for input(s): CHOL, HDL, LDLCALC, TRIG, CHOLHDL, LDLDIRECT in the last 72 hours. Thyroid Function Tests: Recent Labs    03/17/20 1036  TSH 0.951   Anemia Panel: Recent Labs    03/18/20 0523 03/19/20 0631  FERRITIN 1,116* 1,354*   Sepsis Labs: Recent Labs  Lab 03/16/20 1454  PROCALCITON <0.10    Recent Results (from the past 240 hour(s))  MRSA PCR Screening     Status: None   Collection Time: 03/10/20 12:47 AM   Specimen: Nasal Mucosa; Nasopharyngeal  Result Value Ref Range Status   MRSA by PCR NEGATIVE NEGATIVE Final    Comment:        The GeneXpert MRSA Assay (FDA approved for NASAL specimens only), is one component of a comprehensive MRSA colonization surveillance program. It is not intended to diagnose MRSA infection nor to guide or monitor treatment for MRSA infections. Performed at [facility], [address], [place], [place] [address]          Radiology Studies: CT ANGIO CHEST PE W OR WO CONTRAST  Result Date: 03/18/2020 CLINICAL DATA:  History of COVID-19 positivity with chest pain and shortness of breath EXAM: CT ANGIOGRAPHY CHEST WITH CONTRAST TECHNIQUE: Multidetector CT imaging of the chest was  performed using the standard protocol during bolus administration of intravenous contrast. Multiplanar CT image reconstructions and MIPs were obtained to evaluate the vascular anatomy. Examination is limited by respiratory artifact. CONTRAST:  154mL OMNIPAQUE IOHEXOL 350 MG/ML SOLN COMPARISON:  Chest x-ray from the previous day. FINDINGS: Cardiovascular: Thoracic aorta shows no aneurysmal dilatation or dissection. No cardiac enlargement is noted. Scattered coronary calcifications are noted. The pulmonary artery is limited by patient respiratory artifact although no definitive filling defects are identified Mediastinum/Nodes: Thoracic inlet is within normal limits. The esophagus as visualized is within normal limits. Scattered small hilar and mediastinal lymph nodes are noted likely reactive in nature. Lungs/Pleura: Lungs are well aerated bilaterally but diffuse airspace opacity is identified consistent with the given clinical history of COVID-19 pneumonia. No sizable effusion is seen. No pneumothorax is noted. Azygos lobe is noted. Upper Abdomen: Visualized upper abdomen is within normal limits. Musculoskeletal: Degenerative changes of the thoracic spine are noted. No acute bony abnormality is seen. Review of the MIP images confirms the above findings. IMPRESSION: Diffuse bilateral airspace opacities  consistent with the given clinical history of COVID-19 pneumonia. No evidence of central pulmonary emboli. Evaluation of the more peripheral branches is limited due to patient respiratory artifact. Electronically Signed   By: Inez Catalina M.D.   On: 03/18/2020 20:51        Scheduled Meds: . baricitinib  4 mg Oral Daily  . Chlorhexidine Gluconate Cloth  6 each Topical Daily  . enoxaparin (LOVENOX) injection  40 mg Subcutaneous Q24H  . insulin aspart  0-15 Units Subcutaneous Q4H  . insulin glargine  12 Units Subcutaneous QHS  . Ipratropium-Albuterol  1 puff Inhalation TID  . methylPREDNISolone (SOLU-MEDROL)  injection  90 mg Intravenous Q12H  . metoprolol tartrate  2.5 mg Intravenous Q8H  . ondansetron (ZOFRAN) IV  4 mg Intravenous Q6H  . pantoprazole  40 mg Oral BID AC   Continuous Infusions: . sodium chloride 75 mL/hr (03/19/20 0522)     LOS: 13 days    Time spent: 35 minutes    Glema Takaki Darleen Crocker, DO [facility]  If 7PM-7AM, please contact night-coverage [email or web address] 03/19/2020, 12:03 PM

## 2020-03-19 NOTE — Progress Notes (Signed)
Patient has removed his oxygen multiple times throughout the night.  Attempts at education and redirection were unsuccessful.  Restraints initiated at 2300.

## 2020-03-20 ENCOUNTER — Inpatient Hospital Stay (HOSPITAL_COMMUNITY): Payer: Commercial Managed Care - PPO

## 2020-03-20 DIAGNOSIS — K56600 Partial intestinal obstruction, unspecified as to cause: Secondary | ICD-10-CM | POA: Diagnosis not present

## 2020-03-20 LAB — COMPREHENSIVE METABOLIC PANEL
ALT: 18 U/L (ref 0–44)
AST: 19 U/L (ref 15–41)
Albumin: 2.5 g/dL — ABNORMAL LOW (ref 3.5–5.0)
Alkaline Phosphatase: 61 U/L (ref 38–126)
Anion gap: 10 (ref 5–15)
BUN: 14 mg/dL (ref 8–23)
CO2: 25 mmol/L (ref 22–32)
Calcium: 8.2 mg/dL — ABNORMAL LOW (ref 8.9–10.3)
Chloride: 91 mmol/L — ABNORMAL LOW (ref 98–111)
Creatinine, Ser: 0.54 mg/dL — ABNORMAL LOW (ref 0.61–1.24)
GFR, Estimated: >60 mL/min (ref >60)
Glucose, Bld: 172 mg/dL — ABNORMAL HIGH (ref 70–99)
Potassium: 4 mmol/L (ref 3.5–5.1)
Sodium: 126 mmol/L — ABNORMAL LOW (ref 135–145)
Total Bilirubin: 1.5 mg/dL — ABNORMAL HIGH (ref 0.3–1.2)
Total Protein: 6 g/dL — ABNORMAL LOW (ref 6.5–8.1)

## 2020-03-20 LAB — CBC
HCT: 44.8 % (ref 39.0–52.0)
Hemoglobin: 16.1 g/dL (ref 13.0–17.0)
MCH: 31.6 pg (ref 26.0–34.0)
MCHC: 35.9 g/dL (ref 30.0–36.0)
MCV: 88 fL (ref 80.0–100.0)
Platelets: 257 10*3/uL (ref 150–400)
RBC: 5.09 MIL/uL (ref 4.22–5.81)
RDW: 11.5 % (ref 11.5–15.5)
WBC: 26.6 10*3/uL — ABNORMAL HIGH (ref 4.0–10.5)
nRBC: 0 % (ref 0.0–0.2)

## 2020-03-20 LAB — BLOOD GAS, ARTERIAL
Acid-Base Excess: 2.8 mmol/L — ABNORMAL HIGH (ref 0.0–2.0)
Bicarbonate: 27.2 mmol/L (ref 20.0–28.0)
FIO2: 100
O2 Saturation: 94.9 %
Patient temperature: 37
pCO2 arterial: 36.3 mmHg (ref 32.0–48.0)
pH, Arterial: 7.472 — ABNORMAL HIGH (ref 7.350–7.450)
pO2, Arterial: 75.6 mmHg — ABNORMAL LOW (ref 83.0–108.0)

## 2020-03-20 LAB — GLUCOSE, CAPILLARY
Glucose-Capillary: 178 mg/dL — ABNORMAL HIGH (ref 70–99)
Glucose-Capillary: 180 mg/dL — ABNORMAL HIGH (ref 70–99)
Glucose-Capillary: 203 mg/dL — ABNORMAL HIGH (ref 70–99)
Glucose-Capillary: 284 mg/dL — ABNORMAL HIGH (ref 70–99)
Glucose-Capillary: 228 mg/dL — ABNORMAL HIGH (ref 70–99)
Glucose-Capillary: 129 mg/dL — ABNORMAL HIGH (ref 70–99)

## 2020-03-20 LAB — C-REACTIVE PROTEIN: CRP: 6 mg/dL — ABNORMAL HIGH (ref <1.0)

## 2020-03-20 LAB — FERRITIN: Ferritin: 1296 ng/mL — ABNORMAL HIGH (ref 24–336)

## 2020-03-20 LAB — BRAIN NATRIURETIC PEPTIDE: B Natriuretic Peptide: 58 pg/mL (ref 0.0–100.0)

## 2020-03-20 LAB — PROCALCITONIN: Procalcitonin: 0.16 ng/mL

## 2020-03-20 LAB — D-DIMER, QUANTITATIVE: D-Dimer, Quant: 6.68 ug/mL-FEU — ABNORMAL HIGH (ref 0.00–0.50)

## 2020-03-20 MED ORDER — ENOXAPARIN SODIUM 40 MG/0.4ML ~~LOC~~ SOLN
40.0000 mg | Freq: Two times a day (BID) | SUBCUTANEOUS | Status: DC
  Administered 2020-03-20 – 2020-03-26 (×14): 40 mg via SUBCUTANEOUS
  Filled 2020-03-20 (×14): qty 0.4

## 2020-03-20 MED ORDER — LIDOCAINE 5 % EX PTCH
1.0000 | MEDICATED_PATCH | CUTANEOUS | Status: DC
  Administered 2020-03-20 – 2020-04-07 (×17): 1 via TRANSDERMAL
  Filled 2020-03-20 (×22): qty 1

## 2020-03-20 MED ORDER — FENTANYL CITRATE (PF) 100 MCG/2ML IJ SOLN
50.0000 ug | INTRAMUSCULAR | Status: DC | PRN
  Administered 2020-03-20 – 2020-03-26 (×22): 50 ug via INTRAVENOUS
  Filled 2020-03-20 (×22): qty 2

## 2020-03-20 MED ORDER — DICLOFENAC SODIUM 1 % EX GEL
4.0000 g | Freq: Four times a day (QID) | CUTANEOUS | Status: DC
  Administered 2020-03-20 – 2020-04-14 (×94): 4 g via TOPICAL
  Filled 2020-03-20 (×2): qty 100

## 2020-03-20 MED ORDER — ADULT MULTIVITAMIN W/MINERALS CH
1.0000 | ORAL_TABLET | Freq: Every day | ORAL | Status: DC
  Administered 2020-03-20: 1 via ORAL
  Filled 2020-03-20 (×3): qty 1

## 2020-03-20 NOTE — Progress Notes (Signed)
Patient is asleep with saturation 94 on hfnc 40 liters at 100 percent, BiPAP is on stand by if needed.

## 2020-03-20 NOTE — Progress Notes (Signed)
Patient's wife called this RN but was unable to take call due to being in another patient's room. NT informed this RN that patient's wife was requesting a low-residue diet. Patient's wife was told by NT that doctor must order a residue diet. Patient's wife upset that patient had corn on his tray for dinner. (patient did not eat corn that was on his tray because he said he did not want to eat). Will pass on in report for doctor to change patient's diet order, if appropriate.

## 2020-03-20 NOTE — Progress Notes (Signed)
During evening medication pass, Student-RN assisted patient with evening meal.  After two bites patient stated that he was finished eating.  At this time patient requested that dinner and the snacks brought by wife be thrown out.

## 2020-03-20 NOTE — Progress Notes (Signed)
1:16 AM RN called due to patient desating despite maxing out on it HF and NRB.  Patient agreed to BiPAP, BiPAP was started.  ABG will be checked.

## 2020-03-20 NOTE — Progress Notes (Signed)
PROGRESS NOTE    Calvin Cruz  [id number] DOB: 11-16-1960 DOA: 02/24/2020 PCP: Reynold Bowen, MD   Brief Narrative:  59 year old male with a history ofsecondarypolycythemia(JAK2 neg), hyperlipidemia, diabetes mellitus type 2, hypertension, coronary artery disease status post NSTEMI with DES to the circumflex June 2016, ulcerative colitis status post proctocolectomy with ileoanal anastomosis, and recurrent episodes of pouchitis presenting with upper abdominal pain, nausea, vomiting, and loose stools that began on 03/04/2020. He states that he normally has 5-6 loose bowel movements on average day secondary to his ulcerative colitis. He normally uses Imodium, usually 1 to 2 tablets on a daily basis. His last bowel movement was on 03/02/2020. He denies any fevers, chills, chest pain, shortness of breath, cough, hemoptysis. He denies any headache, sore throat, hemoptysis. He has had numerous episodes of emesis on 03/10/2020 resulting in dry heaving. There is no hematemesis. He denies hematochezia or melena.He has not had a bowel movement since the afternoon of 03/12/2020. In the emergency department, the patient was afebrile hemodynamically stable with oxygen saturation 94-96% on room air. BMP showed a sodium 135, potassium 4.1, BUN 14, serum creatinine 0.81. LFTs were unremarkable. Lipase 21. WBC 7.1, hemoglobin 17.7, platelets 174,000. CT of the abdomen and pelvis showed mildly dilated air-fluid filled loops of ileum up to 3.2 cm. There was no clear transition point or focal narrowing. There is no surrounding inflammatory changes. He is status post colectomy with ileal colonic anastomosis.  Assessment & Plan:   Principal Problem:   Partial small bowel obstruction (HCC) Active Problems:   Hyperlipidemia with target LDL less than 70   Essential hypertension   CAD S/P DES PCI-circumflex   Abdominal pain   COVID-19 virus infection   Hyperglycemia due to diabetes mellitus  (HCC)   Nausea and vomiting   Gastroenteritis due to COVID-19 virus   Abdominal distention   Hypoxemia   SOB (shortness of breath)   Partial small bowel obstruction-resolved -Patient currently onregular diet and tolerating -continue judicious use of narcotics; goal is not for pain free; but to assist tolerating symptoms.  -Continue the use of levsin as needed . -Continue PRN antiemetics.  -Will continue to follow recommendations by GI serviceprn -Patient also with improvement in his distention, having bowel sounds and had experienced multiple bowel movements overnight. No nausea or vomiting currently. -Patient has been instructed to increase physical activity. -Plan to slowly wean fentanyl use  Acute respiratory failure with hypoxia due toCOVID-19pneumonia;patient also with COVID-19gastroenteritis/Infection -Patient was found to beincidentallyCOVID-19 positive by RT-PCR -Withcontinue to followinflammatory markers-still elevated -Remdesivir completed, continues on steroids, increased dose to 1 mg/kg on 1/28 -Initiated baricitinib 1/27 -continuePPI. -Patient has not been vaccinated -Appreciate GI consultation; simethicone to assist with over tympanic and obstipation symptoms.  -Continue tomonitor renal function and LFTs. -Repeat chest x-rayon1/24/2022 demonstrating diffuse bilateral infiltrates/opacities consistent with Covid infection. -Following family request,pulmonology service has been consulted to further evaluate patient's care. -Procalcitonin lowand repeat chest x-ray stable on 1/30, BNP low as well -Elevated D-dimers noted on 1/28 for which CT angiogram for PE study ordered-negative -Plan to allow wife to visit to increase mobilization  Delirium/agitation -Haldol ordered as needed -Continues in restraints currently  Hyponatremia -TSH within normal limits and serum osmolarity 272 -Plan to fluid restrict -Changed to regular diet -IV normal saline  added -Monitor in a.m.  Coronary artery disease with history of NSTEMI -No chest pain presently -Restart Brilinta when able to fully tolerate p.o. -Patient follows with Dr. Glenetta Hew -Beta-blocker has been resumed.  Diabetes mellitus type 2, controlled -Restart Jardianceoutpatient -Continue sliding scale insulin while inpatient. -03/07/20 A1C--6.7 -Treatment for COVID infection using the steroids has been started; CBGs expected to get elevated -Will add low-dose Lantus to assist with management.  Hyperlipidemia -Restart Crestor and Vascepa once able to tolerate p.o.  Pituitary Tumor  -Found in 08/28/16 Brain MRI  -Will continue outpatient follow up with Dr. Forde Dandy.   Status is: Inpatient  Remains inpatient appropriate because: IV treatments appropriate due to intensity of illness or inability to take PO   Dispo: The patient is from:Home Anticipated d/c is [id number] Anticipated d/c date is:To be determined Patient currently is not medically stable to d/c.   Family Communication:spouse updated 03/20/20  Consultants:GI, general surgery  Code Status: FULL  DVT Prophylaxis:  Lovenox, now BID starting 1/30   Procedures: As Listed in Progress Note Above  Antimicrobials:  Anti-infectives (From admission, onward)   Start     Dose/Rate Route Frequency Ordered Stop   03/11/20 1000  remdesivir 100 mg in sodium chloride 0.9 % 100 mL IVPB        100 mg 200 mL/hr over 30 Minutes Intravenous Daily 03/10/20 1105 03/14/20 0900   03/10/20 1200  remdesivir 100 mg in sodium chloride 0.9 % 100 mL IVPB        100 mg 200 mL/hr over 30 Minutes Intravenous Every 1 hr x 2 03/10/20 1105 03/10/20 1320      Subjective: Patient seen and evaluated today with no new acute complaints or concerns.  Hypoxemia was recently received overnight and he is currently on BiPAP.  Objective: Vitals:   03/20/20 1000 03/20/20 1041  03/20/20 1150 03/20/20 1200  BP: (!) 189/85     Pulse: (!) 127  (!) 113 (!) 102  Resp: (!) 34  (!) 34 (!) 21  Temp:   (!) 97.5 F (36.4 C)   TempSrc:   Oral   SpO2: (!) 80% 90% (!) 83% (!) 88%  Weight:      Height:        Intake/Output Summary (Last 24 hours) at 03/20/2020 1253 Last data filed at 03/20/2020 1000 Gross per 24 hour  Intake 1105.97 ml  Output 2770 ml  Net -1664.03 ml   Filed Weights   03/15/20 0500 03/17/20 0300 03/19/20 0500  Weight: 97.5 kg 92.7 kg 92.7 kg    Examination:  General exam: Appears calm and comfortable  Respiratory system: Clear to auscultation. Respiratory effort normal. On Bipap 100%. Cardiovascular system: S1 & S2 heard, RRR.  Gastrointestinal system: Abdomen is soft Central nervous system: Alert and awake Extremities: No edema Skin: No significant lesions noted Psychiatry: Flat affect.    Data Reviewed: I have personally reviewed following labs and imaging studies  CBC: Recent Labs  Lab 03/15/20 0346 03/17/20 0435 03/18/20 0523 03/19/20 0631 03/20/20 0500  WBC 13.6* 17.3* 19.4* 22.8* 26.6*  HGB 16.6 17.0 16.6 16.3 16.1  HCT 46.6 48.0 47.1 45.2 44.8  MCV 88.3 88.2 88.5 87.9 88.0  PLT 227 270 223 238 [id number]   Basic Metabolic Panel: Recent Labs  Lab 03/15/20 0346 03/17/20 0435 03/18/20 0523 03/19/20 0631 03/20/20 0500  NA 131* 127* 126* 128* 126*  K 4.5 4.3 4.3 4.2 4.0  CL 96* 89* 90* 94* 91*  CO2 25 26 23 26 25   GLUCOSE 165* 149* 184* 160* 172*  BUN 13 14 16 14 14   CREATININE 0.51* 0.51* 0.58* 0.47* 0.54*  CALCIUM 8.3* 8.6* 8.1* 8.2* 8.2*  MG  --  1.9  1.9  --   --    GFR: Estimated Creatinine Clearance: 125.4 mL/min (A) (by C-G formula based on SCr of 0.54 mg/dL (L)). Liver Function Tests: Recent Labs  Lab 03/15/20 0346 03/17/20 0435 03/18/20 0523 03/19/20 0631 03/20/20 0500  AST 29 21 25 20 19   ALT 17 17 21 18 18   ALKPHOS 52 64 65 67 61  BILITOT 1.6* 1.8* 1.8* 1.5* 1.5*  PROT 5.9* 6.3* 6.0* 6.1* 6.0*   ALBUMIN 2.8* 2.7* 2.5* 2.5* 2.5*   No results for input(s): LIPASE, AMYLASE in the last 168 hours. No results for input(s): AMMONIA in the last 168 hours. Coagulation Profile: No results for input(s): INR, PROTIME in the last 168 hours. Cardiac Enzymes: No results for input(s): CKTOTAL, CKMB, CKMBINDEX, TROPONINI in the last 168 hours. BNP (last 3 results) No results for input(s): PROBNP in the last 8760 hours. HbA1C: No results for input(s): HGBA1C in the last 72 hours. CBG: Recent Labs  Lab 03/19/20 1950 03/19/20 2302 03/20/20 0411 03/20/20 0757 03/20/20 1148  GLUCAP 226* 135* 178* 180* 203*   Lipid Profile: No results for input(s): CHOL, HDL, LDLCALC, TRIG, CHOLHDL, LDLDIRECT in the last 72 hours. Thyroid Function Tests: No results for input(s): TSH, T4TOTAL, FREET4, T3FREE, THYROIDAB in the last 72 hours. Anemia Panel: Recent Labs    03/19/20 0631 03/20/20 0500  FERRITIN 1,354* 1,296*   Sepsis Labs: Recent Labs  Lab 03/16/20 1454 03/20/20 0700  PROCALCITON <0.10 0.16    No results found for this or any previous visit (from the past 240 hour(s)).       Radiology Studies: CT ANGIO CHEST PE W OR WO CONTRAST  Result Date: 03/18/2020 CLINICAL DATA:  History of COVID-19 positivity with chest pain and shortness of breath EXAM: CT ANGIOGRAPHY CHEST WITH CONTRAST TECHNIQUE: Multidetector CT imaging of the chest was performed using the standard protocol during bolus administration of intravenous contrast. Multiplanar CT image reconstructions and MIPs were obtained to evaluate the vascular anatomy. Examination is limited by respiratory artifact. CONTRAST:  145mL OMNIPAQUE IOHEXOL 350 MG/ML SOLN COMPARISON:  Chest x-ray from the previous day. FINDINGS: Cardiovascular: Thoracic aorta shows no aneurysmal dilatation or dissection. No cardiac enlargement is noted. Scattered coronary calcifications are noted. The pulmonary artery is limited by patient respiratory artifact  although no definitive filling defects are identified Mediastinum/Nodes: Thoracic inlet is within normal limits. The esophagus as visualized is within normal limits. Scattered small hilar and mediastinal lymph nodes are noted likely reactive in nature. Lungs/Pleura: Lungs are well aerated bilaterally but diffuse airspace opacity is identified consistent with the given clinical history of COVID-19 pneumonia. No sizable effusion is seen. No pneumothorax is noted. Azygos lobe is noted. Upper Abdomen: Visualized upper abdomen is within normal limits. Musculoskeletal: Degenerative changes of the thoracic spine are noted. No acute bony abnormality is seen. Review of the MIP images confirms the above findings. IMPRESSION: Diffuse bilateral airspace opacities consistent with the given clinical history of COVID-19 pneumonia. No evidence of central pulmonary emboli. Evaluation of the more peripheral branches is limited due to patient respiratory artifact. Electronically Signed   By: Inez Catalina M.D.   On: 03/18/2020 20:51        Scheduled Meds: . vitamin C  1,000 mg Oral Daily  . baricitinib  4 mg Oral Daily  . Chlorhexidine Gluconate Cloth  6 each Topical Daily  . cholecalciferol  5,000 Units Oral Daily  . diclofenac Sodium  4 g Topical QID  . enoxaparin (LOVENOX) injection  40  mg Subcutaneous Q12H  . insulin aspart  0-15 Units Subcutaneous Q4H  . insulin glargine  12 Units Subcutaneous QHS  . Ipratropium-Albuterol  1 puff Inhalation TID  . lidocaine  1 patch Transdermal Q24H  . methylPREDNISolone (SOLU-MEDROL) injection  90 mg Intravenous Q12H  . metoprolol tartrate  2.5 mg Intravenous Q8H  . multivitamin with minerals  1 tablet Oral Daily  . ondansetron (ZOFRAN) IV  4 mg Intravenous Q6H  . pantoprazole  40 mg Oral BID AC  . zinc sulfate  220 mg Oral Daily   Continuous Infusions: . sodium chloride 75 mL/hr at 03/20/20 0509     LOS: 14 days    Time spent: 35 minutes    Caitlan Chauca Darleen Crocker,  DO [facility]  If 7PM-7AM, please contact night-coverage [email or web address] 03/20/2020, 12:53 PM

## 2020-03-21 DIAGNOSIS — U071 COVID-19: Secondary | ICD-10-CM | POA: Diagnosis not present

## 2020-03-21 DIAGNOSIS — K56600 Partial intestinal obstruction, unspecified as to cause: Secondary | ICD-10-CM | POA: Diagnosis not present

## 2020-03-21 DIAGNOSIS — J1282 Pneumonia due to coronavirus disease 2019: Secondary | ICD-10-CM

## 2020-03-21 DIAGNOSIS — J9601 Acute respiratory failure with hypoxia: Secondary | ICD-10-CM | POA: Diagnosis not present

## 2020-03-21 LAB — COMPREHENSIVE METABOLIC PANEL
ALT: 16 U/L (ref 0–44)
AST: 22 U/L (ref 15–41)
Albumin: 2.2 g/dL — ABNORMAL LOW (ref 3.5–5.0)
Alkaline Phosphatase: 54 U/L (ref 38–126)
Anion gap: 9 (ref 5–15)
BUN: 18 mg/dL (ref 8–23)
CO2: 25 mmol/L (ref 22–32)
Calcium: 8.1 mg/dL — ABNORMAL LOW (ref 8.9–10.3)
Chloride: 94 mmol/L — ABNORMAL LOW (ref 98–111)
Creatinine, Ser: 0.5 mg/dL — ABNORMAL LOW (ref 0.61–1.24)
GFR, Estimated: >60 mL/min (ref >60)
Glucose, Bld: 185 mg/dL — ABNORMAL HIGH (ref 70–99)
Potassium: 4.3 mmol/L (ref 3.5–5.1)
Sodium: 128 mmol/L — ABNORMAL LOW (ref 135–145)
Total Bilirubin: 1.4 mg/dL — ABNORMAL HIGH (ref 0.3–1.2)
Total Protein: 5.5 g/dL — ABNORMAL LOW (ref 6.5–8.1)

## 2020-03-21 LAB — GLUCOSE, CAPILLARY
Glucose-Capillary: 157 mg/dL — ABNORMAL HIGH (ref 70–99)
Glucose-Capillary: 176 mg/dL — ABNORMAL HIGH (ref 70–99)
Glucose-Capillary: 178 mg/dL — ABNORMAL HIGH (ref 70–99)
Glucose-Capillary: 169 mg/dL — ABNORMAL HIGH (ref 70–99)
Glucose-Capillary: 225 mg/dL — ABNORMAL HIGH (ref 70–99)
Glucose-Capillary: 204 mg/dL — ABNORMAL HIGH (ref 70–99)

## 2020-03-21 LAB — FERRITIN: Ferritin: 2112 ng/mL — ABNORMAL HIGH (ref 24–336)

## 2020-03-21 LAB — CBC
HCT: 43.2 % (ref 39.0–52.0)
Hemoglobin: 15.2 g/dL (ref 13.0–17.0)
MCH: 31.3 pg (ref 26.0–34.0)
MCHC: 35.2 g/dL (ref 30.0–36.0)
MCV: 88.9 fL (ref 80.0–100.0)
Platelets: 224 10*3/uL (ref 150–400)
RBC: 4.86 MIL/uL (ref 4.22–5.81)
RDW: 11.5 % (ref 11.5–15.5)
WBC: 23 10*3/uL — ABNORMAL HIGH (ref 4.0–10.5)
nRBC: 0 % (ref 0.0–0.2)

## 2020-03-21 LAB — C-REACTIVE PROTEIN: CRP: 11.1 mg/dL — ABNORMAL HIGH (ref <1.0)

## 2020-03-21 LAB — D-DIMER, QUANTITATIVE: D-Dimer, Quant: 4.7 ug/mL-FEU — ABNORMAL HIGH (ref 0.00–0.50)

## 2020-03-21 NOTE — Progress Notes (Signed)
Nurse reported patient became nauseated ,voimited pulled off all his oxygen and had a major decrease in saturation. 40's .Patient back on oxygen and giving rx for nausea.

## 2020-03-21 NOTE — Progress Notes (Signed)
PROGRESS NOTE    Calvin Cruz  [id number] DOB: 25-Aug-1960 DOA: 03/09/2020 PCP: Reynold Bowen, MD   Brief Narrative:  60 year old male with a history ofsecondarypolycythemia(JAK2 neg), hyperlipidemia, diabetes mellitus type 2, hypertension, coronary artery disease status post NSTEMI with DES to the circumflex June 2016, ulcerative colitis status post proctocolectomy with ileoanal anastomosis, and recurrent episodes of pouchitis presenting with upper abdominal pain, nausea, vomiting, and loose stools that began on 03/04/2020. He states that he normally has 5-6 loose bowel movements on average day secondary to his ulcerative colitis. He normally uses Imodium, usually 1 to 2 tablets on a daily basis. His last bowel movement was on 02/21/2020. He denies any fevers, chills, chest pain, shortness of breath, cough, hemoptysis. He denies any headache, sore throat, hemoptysis. He has had numerous episodes of emesis on 03/20/2020 resulting in dry heaving. There is no hematemesis. He denies hematochezia or melena.He has not had a bowel movement since the afternoon of 03/12/2020. In the emergency department, the patient was afebrile hemodynamically stable with oxygen saturation 94-96% on room air. BMP showed a sodium 135, potassium 4.1, BUN 14, serum creatinine 0.81. LFTs were unremarkable. Lipase 21. WBC 7.1, hemoglobin 17.7, platelets 174,000. CT of the abdomen and pelvis showed mildly dilated air-fluid filled loops of ileum up to 3.2 cm. There was no clear transition point or focal narrowing. There is no surrounding inflammatory changes. He is status post colectomy with ileal colonic anastomosis.  Assessment & Plan:   Principal Problem:   Partial small bowel obstruction (HCC) Active Problems:   Hyperlipidemia with target LDL less than 70   Essential hypertension   CAD S/P DES PCI-circumflex   Abdominal pain   COVID-19 virus infection   Hyperglycemia due to diabetes mellitus  (HCC)   Nausea and vomiting   Gastroenteritis due to COVID-19 virus   Abdominal distention   Hypoxemia   SOB (shortness of breath)   Partial small bowel obstruction-resolved -Patient currently onregular diet and tolerating -continue judicious use of narcotics; goal is not for pain free; but to assist tolerating symptoms.  -Continue the use of levsin as needed . -Continue PRN antiemetics.  -Will continue to follow recommendations by GI serviceprn -Patient also with improvement in his distention, having bowel sounds and had experienced multiple bowel movements overnight. No nausea or vomiting currently. -Patient has been instructed to increase physical activity. -Plan to slowly wean fentanyl use -Dietician consult to assess nutritional status and will likely need TPN to assist since NGT will not be able to be placed  Acute respiratory failure with hypoxia due toCOVID-19pneumonia;patient also with COVID-19gastroenteritis/Infection -Patient was found to beincidentallyCOVID-19 positive by RT-PCR -Withcontinue to followinflammatory markers-still elevated -Remdesivir completed, continues on steroids, increased dose to 1 mg/kg on 1/28 -Initiated baricitinib 1/27 -continuePPI. -Patient has not been vaccinated -Appreciate GI consultation; simethicone to assist with over tympanic and obstipation symptoms.  -Continue tomonitor renal function and LFTs. -Repeat chest x-rayon1/24/2022 demonstrating diffuse bilateral infiltrates/opacities consistent with Covid infection. -Following family request,pulmonology service has been consulted to further evaluate patient's care. -Procalcitonin lowand repeat chest x-ray stable on 1/30, BNP low as well -Elevated D-dimers noted on 1/28 for which CT angiogram for PE study ordered-negative -Plan to allow wife to visit to increase mobilization -Appreciate Pulmonology recommendations starting 1/31  Delirium/agitation -Haldol ordered as  needed -Continues in restraints currently  Hyponatremia-likely related to SIADH -TSH within normal limits and serum osmolarity 272 -Plan to fluid restrict -Changed to regular diet -IV normal saline added -Monitor in a.m.  Coronary artery disease with history of NSTEMI -No chest pain presently -Restart Brilinta when able to fully tolerate p.o. -Patient follows with Dr. Glenetta Hew -Beta-blocker has been resumed.  Diabetes mellitus type 2, controlled -Restart Jardianceoutpatient -Continue sliding scale insulin while inpatient. -03/07/20 A1C--6.7 -Treatment for COVID infection using the steroids has been started; CBGs expected to get elevated -Will add low-dose Lantus to assist with management.  Hyperlipidemia -Restart Crestor and Vascepa once oral intake adequate  Pituitary Tumor  -Found in 08/28/16 Brain MRI  -Will continue outpatient follow up with Dr. Forde Dandy.   Status is: Inpatient  Remains inpatient appropriate because: IV treatments appropriate due to intensity of illness or inability to take PO   Dispo: The patient is from:Home Anticipated d/c is to:SNF Anticipated d/c date is:To be determined Patient currently is not medically stable to d/c.   Family Communication:spouse updated 03/21/20  Consultants:GI, general surgery, Pulmonology  Code Status: FULL  DVT Prophylaxis: [place] Lovenox, now BID starting 1/30   Procedures: As Listed in Progress Note Above  Antimicrobials:  Anti-infectives (From admission, onward)   Start     Dose/Rate Route Frequency Ordered Stop   03/11/20 1000  remdesivir 100 mg in sodium chloride 0.9 % 100 mL IVPB        100 mg 200 mL/hr over 30 Minutes Intravenous Daily 03/10/20 1105 03/14/20 0900   03/10/20 1200  remdesivir 100 mg in sodium chloride 0.9 % 100 mL IVPB        100 mg 200 mL/hr over 30 Minutes Intravenous Every 1 hr x 2 03/10/20 1105 03/10/20 1320        Subjective: Patient seen and evaluated today and he appears more lethargic and is refusing his medications.  He was noted to have an episode of vomiting overnight and had pulled his oxygen off with noted desaturations.  Objective: Vitals:   03/21/20 1200 03/21/20 1300 03/21/20 1400 03/21/20 1434  BP: 135/64 (!) 145/75 138/71   Pulse: 89 99 94   Resp: 20 (!) 33 (!) 22   Temp:  97.6 F (36.4 C)    TempSrc:  Oral    SpO2: 96% 90% 93% 92%  Weight:      Height:        Intake/Output Summary (Last 24 hours) at 03/21/2020 1534 Last data filed at 03/21/2020 1312 Gross per 24 hour  Intake --  Output 700 ml  Net -700 ml   Filed Weights   03/17/20 0300 03/19/20 0500 03/21/20 0500  Weight: 92.7 kg 92.7 kg 93 kg    Examination:  General exam: Appears lethargic Respiratory system: Clear to auscultation. Respiratory effort normal. Currently on 40L HHFNC and NRB. Cardiovascular system: S1 & S2 heard, RRR.  Gastrointestinal system: Abdomen is soft Central nervous system: Alert and awake Extremities: No edema Skin: No significant lesions noted Psychiatry: Flat affect.    Data Reviewed: I have personally reviewed following labs and imaging studies  CBC: Recent Labs  Lab 03/17/20 0435 03/18/20 0523 03/19/20 0631 03/20/20 0500 03/21/20 0600  WBC 17.3* 19.4* 22.8* 26.6* 23.0*  HGB 17.0 16.6 16.3 16.1 15.2  HCT 48.0 47.1 45.2 44.8 43.2  MCV 88.2 88.5 87.9 88.0 88.9  PLT 270 223 238 257 846   Basic Metabolic Panel: Recent Labs  Lab 03/17/20 0435 03/18/20 0523 03/19/20 0631 03/20/20 0500 03/21/20 0600  NA 127* 126* 128* 126* 128*  K 4.3 4.3 4.2 4.0 4.3  CL 89* 90* 94* 91* 94*  CO2 26 23 26 25  25  GLUCOSE 149* 184* 160* 172* 185*  BUN 14 16 14 14 18   CREATININE 0.51* 0.58* 0.47* 0.54* 0.50*  CALCIUM 8.6* 8.1* 8.2* 8.2* 8.1*  MG 1.9 1.9  --   --   --    GFR: Estimated Creatinine Clearance: 125.4 mL/min (A) (by C-G formula based on SCr of 0.5 mg/dL (L)). Liver  Function Tests: Recent Labs  Lab 03/17/20 0435 03/18/20 0523 03/19/20 0631 03/20/20 0500 03/21/20 0600  AST 21 25 20 19 22   ALT 17 21 18 18 16   ALKPHOS 64 65 67 61 54  BILITOT 1.8* 1.8* 1.5* 1.5* 1.4*  PROT 6.3* 6.0* 6.1* 6.0* 5.5*  ALBUMIN 2.7* 2.5* 2.5* 2.5* 2.2*   No results for input(s): LIPASE, AMYLASE in the last 168 hours. No results for input(s): AMMONIA in the last 168 hours. Coagulation Profile: No results for input(s): INR, PROTIME in the last 168 hours. Cardiac Enzymes: No results for input(s): CKTOTAL, CKMB, CKMBINDEX, TROPONINI in the last 168 hours. BNP (last 3 results) No results for input(s): PROBNP in the last 8760 hours. HbA1C: No results for input(s): HGBA1C in the last 72 hours. CBG: Recent Labs  Lab 03/20/20 2003 03/20/20 2318 03/21/20 0433 03/21/20 0810 03/21/20 1145  GLUCAP 228* 129* 157* 176* 178*   Lipid Profile: No results for input(s): CHOL, HDL, LDLCALC, TRIG, CHOLHDL, LDLDIRECT in the last 72 hours. Thyroid Function Tests: No results for input(s): TSH, T4TOTAL, FREET4, T3FREE, THYROIDAB in the last 72 hours. Anemia Panel: Recent Labs    03/20/20 0500 03/21/20 0600  FERRITIN 1,296* 2,112*   Sepsis Labs: Recent Labs  Lab 03/16/20 1454 03/20/20 0700  PROCALCITON <0.10 0.16    No results found for this or any previous visit (from the past 240 hour(s)).       Radiology Studies: DG Chest 1 View  Result Date: 03/20/2020 CLINICAL DATA:  Follow-up COVID-19 pneumonia. EXAM: CHEST  1 VIEW COMPARISON:  03/17/2020 FINDINGS: Normal sized heart. No significant change in patchy airspace opacities throughout both lungs. No pleural fluid. Unremarkable bones. IMPRESSION: Stable bilateral pneumonia. Electronically Signed   By: Claudie Revering M.D.   On: 03/20/2020 13:29        Scheduled Meds: . vitamin C  1,000 mg Oral Daily  . baricitinib  4 mg Oral Daily  . Chlorhexidine Gluconate Cloth  6 each Topical Daily  . cholecalciferol  5,000  Units Oral Daily  . diclofenac Sodium  4 g Topical QID  . enoxaparin (LOVENOX) injection  40 mg Subcutaneous Q12H  . insulin aspart  0-15 Units Subcutaneous Q4H  . insulin glargine  12 Units Subcutaneous QHS  . Ipratropium-Albuterol  1 puff Inhalation TID  . lidocaine  1 patch Transdermal Q24H  . methylPREDNISolone (SOLU-MEDROL) injection  90 mg Intravenous Q12H  . metoprolol tartrate  2.5 mg Intravenous Q8H  . multivitamin with minerals  1 tablet Oral Daily  . ondansetron (ZOFRAN) IV  4 mg Intravenous Q6H  . pantoprazole  40 mg Oral BID AC  . zinc sulfate  220 mg Oral Daily   Continuous Infusions: . sodium chloride 75 mL/hr at 03/21/20 0949     LOS: 15 days    Time spent: 35 minutes    Aimar [place] Darleen Crocker, DO [facility]  If 7PM-7AM, please contact night-coverage [email or web address] 03/21/2020, 3:34 PM

## 2020-03-21 NOTE — Consult Note (Signed)
NAME:  Calvin Cruz, MRN:  [id number], DOB:  04-Mar-1960, LOS: 62 ADMISSION DATE:  03/03/2020, CONSULTATION DATE:  03/21/2020 REFERRING MD:  Dr. Manuella Ghazi, [facility], CHIEF COMPLAINT:  Short of breath   Brief History:  60 yo male former smoker presented to ER on 03/18/2020 with nausea, vomiting and diarrhea.  He has not received COVID vaccination.  He was found to have COVID 19 infection and partial SBO.  He was seen by GI and surgery.  He had endoscopic insertion of NG tube.  He had persistent fever and increasing O2 needs, and started on remdesivir and steroids for COVID 19 pneumonia.  Transferred to ICU.  Started on baricitinib.  PCCM asked to assist with respiratory management.  Past Medical History:  Ulcerative colitis s/p colectomy, Gout, CAD s/p DES, HTN, HLD, Nephrolithiasis, DM, Erythrocytosis  Significant Hospital Events:  1/15 Admit 1/18 Endoscopic insertion of NG tube 1/20 start remdesivir, steroids 1/24 needing high flow oxygen and NRB  Consults:  Gastroenterology s/o 1/28 Surgery s/o 1/19  Procedures:    Significant Diagnostic Tests:   CT abd/pelvis 1/16 >> lower lungs clear, mildly dilated air and fluid-filled loops of ileum measuring up to 3.2 cm. No clear transition point or focal wall narrowing  CT angio chest 1/28 >> diffuse b/l airspace opacity consistent with COVID 19 pneumonia  Micro Data:  COVID PCR 1/15 >> Positive Flu PCR 1/15 >> negative  Antimicrobials:  Remdesivir 1/20 >> 1/24 Steroids 1/20 >>  Baricitinib 1/27 >>   Interim History / Subjective:    Objective   Blood pressure 138/71, pulse 94, temperature 97.6 F (36.4 C), temperature source Axillary, resp. rate (!) 22, height 6\' 6"  (1.981 m), weight 93 kg, SpO2 92 %.    FiO2 (%):  [100 %] 100 %   Intake/Output Summary (Last 24 hours) at 03/21/2020 1456 Last data filed at 03/21/2020 1312 Gross per 24 hour  Intake --  Output 700 ml  Net -700 ml   Filed Weights   03/17/20 0300 03/19/20 0500  03/21/20 0500  Weight: 92.7 kg 92.7 kg 93 kg    Examination:  General - alert, wearing NRB and high flow oxygen, speaking in full sentences, not using accessory muscles Eyes - pupils reactive ENT - no sinus tenderness, no stridor Cardiac - regular rate/rhythm, no murmur Chest - equal breath sounds b/l, faint b/l crackles, no wheeze Abdomen - soft, non tender, + bowel sounds Extremities - no cyanosis, clubbing, or edema Skin - no rashes Neuro - normal strength, moves extremities, follows commands Psych - anxious  Resolved Hospital Problem list     Assessment & Plan:   Acute hypoxic respiratory failure from COVID 19 pneumonia. - completed course of remdesivir - day 12 of steroids - day 5 of baricitinib - goal SpO2 85 to 95% - bronchial hygiene, mobilize as able - monitor for signs of bacterial superinfection and thromboembolic disease - monitor need for intubation and mechanical ventilation; not needed at present - he asked about alternative therapies for COVID infection that have not received approval by FDA or recommendations from national/international organizations (ivermectin, vitamin infusions, etc); I explained that there is no evidence to support use of these agents, and given this stage of his disease process he is beyond the time window during which these could even be considered  SBO/ileus. Ulcerative colitis. CAD s/p DES, HTN, HLD. DM type 2 poorly controlled with steroid induced hyperglycemia. Hyponatremia. - per primary team  Best practice (evaluated daily)  Diet: regular diet  DVT prophylaxis: lovenox GI prophylaxis: protonix Mobility: OOB to chair Disposition: ICU Code Status: ICU  Labs   CBC: Recent Labs  Lab 03/17/20 0435 03/18/20 0523 03/19/20 0631 03/20/20 0500 03/21/20 0600  WBC 17.3* 19.4* 22.8* 26.6* 23.0*  HGB 17.0 16.6 16.3 16.1 15.2  HCT 48.0 47.1 45.2 44.8 43.2  MCV 88.2 88.5 87.9 88.0 88.9  PLT 270 223 238 257 224    Basic  Metabolic Panel: Recent Labs  Lab 03/17/20 0435 03/18/20 0523 03/19/20 0631 03/20/20 0500 03/21/20 0600  NA 127* 126* 128* 126* 128*  K 4.3 4.3 4.2 4.0 4.3  CL 89* 90* 94* 91* 94*  CO2 26 23 26 25 25   GLUCOSE 149* 184* 160* 172* 185*  BUN 14 16 14 14 18   CREATININE 0.51* 0.58* 0.47* 0.54* 0.50*  CALCIUM 8.6* 8.1* 8.2* 8.2* 8.1*  MG 1.9 1.9  --   --   --    GFR: Estimated Creatinine Clearance: 125.4 mL/min (A) (by C-G formula based on SCr of 0.5 mg/dL (L)). Recent Labs  Lab 03/16/20 1454 03/17/20 0435 03/18/20 0523 03/19/20 0631 03/20/20 0500 03/20/20 0700 03/21/20 0600  PROCALCITON <0.10  --   --   --   --  0.16  --   WBC  --    < > 19.4* 22.8* 26.6*  --  23.0*   < > = values in this interval not displayed.    Liver Function Tests: Recent Labs  Lab 03/17/20 0435 03/18/20 0523 03/19/20 0631 03/20/20 0500 03/21/20 0600  AST 21 25 20 19 22   ALT 17 21 18 18 16   ALKPHOS 64 65 67 61 54  BILITOT 1.8* 1.8* 1.5* 1.5* 1.4*  PROT 6.3* 6.0* 6.1* 6.0* 5.5*  ALBUMIN 2.7* 2.5* 2.5* 2.5* 2.2*   No results for input(s): LIPASE, AMYLASE in the last 168 hours. No results for input(s): AMMONIA in the last 168 hours.  ABG    Component Value Date/Time   PHART 7.472 (H) 03/20/2020 0300   PCO2ART 36.3 03/20/2020 0300   PO2ART 75.6 (L) 03/20/2020 0300   HCO3 27.2 03/20/2020 0300   O2SAT 94.9 03/20/2020 0300     Coagulation Profile: No results for input(s): INR, PROTIME in the last 168 hours.  Cardiac Enzymes: No results for input(s): CKTOTAL, CKMB, CKMBINDEX, TROPONINI in the last 168 hours.  HbA1C: Hgb A1c MFr Bld  Date/Time Value Ref Range Status  03/07/2020 04:30 AM 6.7 (H) 4.8 - 5.6 % Final    Comment:    (NOTE) Pre diabetes:          5.7%-6.4%  Diabetes:              >6.4%  Glycemic control for   <7.0% adults with diabetes   03/09/2020 10:40 PM 6.8 (H) 4.8 - 5.6 % Final    Comment:    (NOTE) Pre diabetes:          5.7%-6.4%  Diabetes:               >6.4%  Glycemic control for   <7.0% adults with diabetes     CBG: Recent Labs  Lab 03/20/20 2003 03/20/20 2318 03/21/20 0433 03/21/20 0810 03/21/20 1145  GLUCAP 228* 129* 157* 176* 178*    Review of Systems:   Reviewed and negative  Past Medical History:  He,  has a past medical history of CAD S/P DES PCI-circumflex (07/22/2014), Diabetes ([place]), Erythrocytosis (11/28/2012), Essential hypertension, Fatigue (11/28/2012), Gout (2014), Hyperlipidemia with target LDL less than 70,  Hypertriglyceridemia (11/16/2014), Kidney stones, Non-STEMI (non-ST elevated myocardial infarction) ([place]) (07/22/2014), and Ulcerative colitis.   Surgical History:   Past Surgical History:  Procedure Laterality Date  . APPENDECTOMY    . CARDIAC CATHETERIZATION N/A 07/23/2014   Procedure: Left Heart Cath and Coronary Angiography;  Surgeon: Leonie Man, MD;  Location: [facility];  Service: Cardiovascular;  RCA: Mild disease; LM: Okay ; LAD: Proximal to mid 40%; RI: 30%;LCx: Mid to distal 90%, distal 20%, OM1 50%; Mid inferolateral HK, EF 55-65%   . CARDIAC CATHETERIZATION N/A 07/23/2014   Procedure: Coronary Stent Intervention;  Surgeon: Leonie Man, MD;  Location: [facility];  Service: Cardiovascular; PCI: 2.75 x 18 mm Xience [place]pine DES to mid LCx at takeoff of OM1 (OM1 jailed 50% stenosis)  . CHOLECYSTECTOMY    . COLECTOMY    . ESOPHAGOGASTRODUODENOSCOPY (EGD) WITH PROPOFOL N/A 03/19/2020   Procedure: ESOPHAGOGASTRODUODENOSCOPY (EGD) WITH PROPOFOL WITH PLACEMENT OF NASO-GASTRIC TUBE;  Surgeon: Harvel Quale, MD;  Location: [facility];  Service: Gastroenterology;  Laterality: N/A;  . HERNIA REPAIR     umbilical  . illeoanalanastomosis  04/2001, 11/2001, 03/2202   has a J pouch  . TRANSTHORACIC ECHOCARDIOGRAM  07/22/2014   EF 55-60%, normal wall motion, normal diastolic function,  no valve lesions      Social History:   reports that he quit smoking about 18 years ago. His  smoking use included cigarettes. He has a 25.00 pack-year smoking history. He has never used smokeless tobacco. He reports that he does not drink alcohol and does not use drugs.   Family History:  His family history includes Cancer (age of onset: 8) in his father; Coronary artery disease in his mother; Diabetes in his brother and mother. There is no history of Colon cancer, Esophageal cancer, or Rectal cancer.   Allergies Allergies  Allergen Reactions  . Metronidazole Itching    Unknown  . Ciprofloxacin Itching and Nausea And Vomiting  . Dexilant [Dexlansoprazole] Diarrhea  . Morphine And Related Itching  . Doxycycline Itching, Other (See Comments) and Rash    Redness of skin, burning sensation Unknown      Home Medications  Prior to Admission medications   Medication Sig Start Date End Date Taking? Authorizing Provider  acetaminophen (TYLENOL) 325 MG tablet Take 650 mg by mouth every 6 (six) hours as needed for mild pain.   Yes [provider]  BRILINTA 60 MG TABS tablet Take 1 tablet by mouth twice daily Patient taking differently: Take 60 mg by mouth 2 (two) times daily. 03/03/20  Yes Leonie Man, MD  Cholecalciferol 100 MCG (4000 UT) CAPS Take 1 capsule by mouth every morning.    Yes [provider]  Coenzyme Q10 (COQ10) 100 MG CAPS Take 100 mg by mouth daily. GRADUALLY INCREASE TO 300 MG DAILY Patient taking differently: Take 100 mg by mouth every morning. 11/17/14  Yes Leonie Man, MD  diclofenac sodium (VOLTAREN) 1 % GEL Apply 2 g topically daily as needed (for pain).  09/14/15  Yes [provider]  diphenoxylate-atropine (LOMOTIL) 2.5-0.025 MG tablet Take 1 tablet by mouth 4 (four) times daily as needed for diarrhea or loose stools. Reported on 09/09/2015 Patient taking differently: Take 2 tablets by mouth in the morning and at bedtime. *May also take one table as needed for diarrhea/loose stools 11/22/15  Yes Rehman, Mechele Dawley, MD  empagliflozin  (JARDIANCE) 10 MG TABS tablet Take 10 mg by mouth every morning.  Yes [provider]  ezetimibe (ZETIA) 10 MG tablet Take 1 tablet (10 mg total) by mouth daily. 08/27/16  Yes Leonie Man, MD  gabapentin (NEURONTIN) 600 MG tablet Take 1 tablet (600 mg total) by mouth at bedtime. 02/05/20  Yes Dickie La, MD  Lactobacillus (PROBIOTIC ACIDOPHILUS) CAPS Take 2 capsules by mouth daily.   Yes [provider]  metoprolol succinate (TOPROL-XL) 25 MG 24 hr tablet Take 12.5 mg by mouth every evening. 03/19/19  Yes [provider]  nitroGLYCERIN (NITROSTAT) 0.4 MG SL tablet Place 1 tablet (0.4 mg total) under the tongue every 5 (five) minutes x 3 doses as needed for chest pain. 10/11/17  Yes Duke, Tami Lin, PA  omeprazole (PRILOSEC OTC) 20 MG tablet Take 20 mg by mouth daily as needed (for acid reflux).    Yes [provider]  OVER THE COUNTER MEDICATION Take 2 tablets by mouth daily. Patient takes 2 by mouth daily - Vita - Sprout Capsules - Multi Vitamin   Yes [provider]  rosuvastatin (CRESTOR) 10 MG tablet Take 1 tablet twice weekly on Monday and Friday Patient taking differently: Take 10 mg by mouth 2 (two) times a week. Take 1 tablet twice weekly on Monday and Friday 05/07/16  Yes Leonie Man, MD  Turmeric 500 MG CAPS Take 1 capsule by mouth daily.   Yes [provider]  ULORIC 40 MG tablet Take 40 mg by mouth every morning.  05/09/15  Yes [provider]  VASCEPA 1 g capsule Take 2 capsules by mouth twice daily Patient taking differently: Take 2 g by mouth 2 (two) times daily. 03/03/20  Yes Leonie Man, MD     Critical care time: 35 minutes  Chesley Mires, MD [place] Pager - [phone] 03/21/2020, 3:19 PM

## 2020-03-21 NOTE — Progress Notes (Signed)
Initial Nutrition Assessment  DOCUMENTATION CODES:     INTERVENTION:   If patient unable to have NGT placed:  Recommend initiation of TPN per pharmacy   Monitor magnesium, potassium, and phosphorus daily for at least 3 days, MD to replete as needed, as pt is at risk for refeeding syndrome given poor oral intake.   NUTRITION DIAGNOSIS:   Increased nutrient needs related to catabolic illness ([id number]) as evidenced by estimated needs,energy intake < or equal to 50% for > or equal to 5 days.   GOAL:  Provide needs based on ASPEN/SCCM guidelines  MONITOR:  PO intake,Skin,I & O's,Labs  REASON FOR ASSESSMENT:   Consult Assessment of nutrition requirement/status  ASSESSMENT: Patient is a 60 yo male with history of Ulcerative colitis s/p ileal anastomosis /colectomy, gout, CAD, DM2 and HTN. He presents with small bowel obstruction and COVID-19 pneumonia and gastroenteritis/infection.  Partial small bowel is resolved per MD. Patient is receiving a regular diet.  He experienced and episode of vomiting per RTT note. Diet has been fully advanced since 1/26. Patient intake documented 25-50%. Expect patient is meeting <50% of est nutrition needs currently. Patient in fluid restriction currently due to hyponatremia.   Discussed patient progression with nursing. Patient difficult NGT placement. Recommend consider supporting nutrition with TPN.  Labs reviewed: sodium 128 (L), glucose 185 (H). Ferritin 2112 (H), CRP-11.1 (H), WBC-23 (H), D-dimmer 4.7 (H).  CBG's: 157, 176, 178.  Medications reviewed and include: Vitamin D3, Vita C, Novolog, Lantus, MVI, Solumedrol, Zinc.  IV-NS@75ml /hr   Intake/Output Summary (Last 24 hours) at 03/21/2020 1625 Last data filed at 03/21/2020 1312 Gross per 24 hour  Intake --  Output 700 ml  Net -700 ml  Net since admission:  +10.427 liters   Weight 108.9 kg on admission. Current weight 93 kg.   Diet Order:   Diet Order            Diet regular Room  service appropriate? Yes; Fluid consistency: Thin; Fluid restriction: 1200 mL Fluid  Diet effective now                 EDUCATION NEEDS:  Not appropriate for education at this time  Skin:  Skin Assessment: Reviewed RN Assessment  Last BM:  1/30  Height:   Ht Readings from Last 1 Encounters:  03/20/20 6\' 6"  (1.981 m)    Weight:   Wt Readings from Last 1 Encounters:  03/21/20 93 kg    Ideal Body Weight:   95 kg  BMI:  Body mass index is 23.69 kg/m.  Estimated Nutritional Needs:   Kcal:  8469-6295  Protein:  140-167 gr protein  Fluid:  per MD goals  Colman Cater MS,RD,CSG,LDN Pager: Shea Evans

## 2020-03-22 ENCOUNTER — Inpatient Hospital Stay: Payer: Self-pay

## 2020-03-22 DIAGNOSIS — J9601 Acute respiratory failure with hypoxia: Secondary | ICD-10-CM | POA: Diagnosis not present

## 2020-03-22 DIAGNOSIS — U071 COVID-19: Secondary | ICD-10-CM | POA: Diagnosis not present

## 2020-03-22 DIAGNOSIS — K56600 Partial intestinal obstruction, unspecified as to cause: Secondary | ICD-10-CM | POA: Diagnosis not present

## 2020-03-22 LAB — CBC WITH DIFFERENTIAL/PLATELET
Abs Immature Granulocytes: 0.15 10*3/uL — ABNORMAL HIGH (ref 0.00–0.07)
Basophils Absolute: 0 10*3/uL (ref 0.0–0.1)
Basophils Relative: 0 %
Eosinophils Absolute: 0 10*3/uL (ref 0.0–0.5)
Eosinophils Relative: 0 %
HCT: 41.6 % (ref 39.0–52.0)
Hemoglobin: 14.9 g/dL (ref 13.0–17.0)
Immature Granulocytes: 1 %
Lymphocytes Relative: 3 %
Lymphs Abs: 0.5 10*3/uL — ABNORMAL LOW (ref 0.7–4.0)
MCH: 31.8 pg (ref 26.0–34.0)
MCHC: 35.8 g/dL (ref 30.0–36.0)
MCV: 88.9 fL (ref 80.0–100.0)
Monocytes Absolute: 0.2 10*3/uL (ref 0.1–1.0)
Monocytes Relative: 1 %
Neutro Abs: 15.2 10*3/uL — ABNORMAL HIGH (ref 1.7–7.7)
Neutrophils Relative %: 95 %
Platelets: 218 10*3/uL (ref 150–400)
RBC: 4.68 MIL/uL (ref 4.22–5.81)
RDW: 11.6 % (ref 11.5–15.5)
WBC: 16 10*3/uL — ABNORMAL HIGH (ref 4.0–10.5)
nRBC: 0 % (ref 0.0–0.2)

## 2020-03-22 LAB — COMPREHENSIVE METABOLIC PANEL
ALT: 13 U/L (ref 0–44)
AST: 48 U/L — ABNORMAL HIGH (ref 15–41)
Albumin: 2.2 g/dL — ABNORMAL LOW (ref 3.5–5.0)
Alkaline Phosphatase: 59 U/L (ref 38–126)
Anion gap: 7 (ref 5–15)
BUN: 14 mg/dL (ref 8–23)
CO2: 23 mmol/L (ref 22–32)
Calcium: 7.7 mg/dL — ABNORMAL LOW (ref 8.9–10.3)
Chloride: 95 mmol/L — ABNORMAL LOW (ref 98–111)
Creatinine, Ser: 0.5 mg/dL — ABNORMAL LOW (ref 0.61–1.24)
GFR, Estimated: >60 mL/min (ref >60)
Glucose, Bld: 165 mg/dL — ABNORMAL HIGH (ref 70–99)
Potassium: 5.8 mmol/L — ABNORMAL HIGH (ref 3.5–5.1)
Sodium: 125 mmol/L — ABNORMAL LOW (ref 135–145)
Total Bilirubin: 2.3 mg/dL — ABNORMAL HIGH (ref 0.3–1.2)
Total Protein: 5.4 g/dL — ABNORMAL LOW (ref 6.5–8.1)

## 2020-03-22 LAB — C-REACTIVE PROTEIN: CRP: 6.9 mg/dL — ABNORMAL HIGH (ref <1.0)

## 2020-03-22 LAB — GLUCOSE, CAPILLARY
Glucose-Capillary: 162 mg/dL — ABNORMAL HIGH (ref 70–99)
Glucose-Capillary: 165 mg/dL — ABNORMAL HIGH (ref 70–99)
Glucose-Capillary: 178 mg/dL — ABNORMAL HIGH (ref 70–99)
Glucose-Capillary: 184 mg/dL — ABNORMAL HIGH (ref 70–99)
Glucose-Capillary: 197 mg/dL — ABNORMAL HIGH (ref 70–99)
Glucose-Capillary: 222 mg/dL — ABNORMAL HIGH (ref 70–99)

## 2020-03-22 LAB — FERRITIN: Ferritin: 2272 ng/mL — ABNORMAL HIGH (ref 24–336)

## 2020-03-22 LAB — MAGNESIUM: Magnesium: 2.1 mg/dL (ref 1.7–2.4)

## 2020-03-22 LAB — D-DIMER, QUANTITATIVE: D-Dimer, Quant: 3.53 ug/mL-FEU — ABNORMAL HIGH (ref 0.00–0.50)

## 2020-03-22 LAB — POTASSIUM: Potassium: 4.4 mmol/L (ref 3.5–5.1)

## 2020-03-22 MED ORDER — TRACE MINERALS CU-MN-SE-ZN 300-55-60-3000 MCG/ML IV SOLN
INTRAVENOUS | Status: AC
  Filled 2020-03-22: qty 273.6

## 2020-03-22 MED ORDER — SODIUM CHLORIDE 0.9% FLUSH
10.0000 mL | Freq: Two times a day (BID) | INTRAVENOUS | Status: DC
  Administered 2020-03-22 – 2020-03-24 (×4): 10 mL
  Administered 2020-03-24: 12:00:00 30 mL
  Administered 2020-03-25: 22:00:00 10 mL
  Administered 2020-03-25: 09:00:00 30 mL
  Administered 2020-03-26 – 2020-03-27 (×2): 10 mL
  Administered 2020-03-27: 20 mL
  Administered 2020-03-28 – 2020-03-30 (×4): 10 mL
  Administered 2020-03-31 – 2020-04-01 (×2): 20 mL
  Administered 2020-04-04 – 2020-04-07 (×7): 10 mL
  Administered 2020-04-11 – 2020-04-12 (×3): 20 mL
  Administered 2020-04-12: 10 mL

## 2020-03-22 MED ORDER — SODIUM ZIRCONIUM CYCLOSILICATE 10 G PO PACK
10.0000 g | PACK | Freq: Once | ORAL | Status: AC
  Administered 2020-03-22: 10 g via ORAL
  Filled 2020-03-22: qty 1

## 2020-03-22 MED ORDER — SODIUM CHLORIDE 0.9% FLUSH
10.0000 mL | INTRAVENOUS | Status: DC | PRN
Start: 2020-03-22 — End: 2020-04-08

## 2020-03-22 MED ORDER — SODIUM CHLORIDE 0.9 % IV SOLN: INTRAVENOUS | Status: AC

## 2020-03-22 NOTE — Consult Note (Signed)
NAME:  Calvin Cruz, MRN:  [id number], DOB:  May 19, 1960, LOS: 21 ADMISSION DATE:  02/22/2020, CONSULTATION DATE:  03/21/2020 REFERRING MD:  Dr. Manuella Ghazi, [facility], CHIEF COMPLAINT:  Short of breath   Brief History:  60 yo male former smoker presented to ER on 02/28/2020 with nausea, vomiting and diarrhea.  He has not received COVID vaccination.  He was found to have COVID 19 infection and partial SBO.  He was seen by GI and surgery.  He had endoscopic insertion of NG tube.  He had persistent fever and increasing O2 needs, and started on remdesivir and steroids for COVID 19 pneumonia.  Transferred to ICU.  Started on baricitinib.  PCCM asked to assist with respiratory management.  Past Medical History:  Ulcerative colitis s/p colectomy, Gout, CAD s/p DES, HTN, HLD, Nephrolithiasis, DM, Erythrocytosis  Significant Hospital Events:  1/15 Admit 1/18 Endoscopic insertion of NG tube 1/20 start remdesivir, steroids 1/24 needing high flow oxygen and NRB 2/01 start TPN  Consults:  Gastroenterology s/o 1/28 Surgery s/o 1/19  Procedures:    Significant Diagnostic Tests:   CT abd/pelvis 1/16 >> lower lungs clear, mildly dilated air and fluid-filled loops of ileum measuring up to 3.2 cm. No clear transition point or focal wall narrowing  CT angio chest 1/28 >> diffuse b/l airspace opacity consistent with COVID 19 pneumonia  Micro Data:  COVID PCR 1/15 >> Positive Flu PCR 1/15 >> negative  Antimicrobials:  Remdesivir 1/20 >> 1/24 Steroids 1/20 >>  Baricitinib 1/27 >>   Interim History / Subjective:  Feels heavy in his chest, but breathing feels some better.  Not having cough.  On 35 liters high flow oxygen.  Objective   Blood pressure 120/64, pulse 96, temperature 97.6 F (36.4 C), temperature source Axillary, resp. rate 17, height 6\' 6"  (1.981 m), weight 90.2 kg, SpO2 92 %.    Vent Mode: BIPAP FiO2 (%):  [100 %] 100 % Set Rate:  [26 bmp] 26 bmp PEEP:  [6 cmH20] 6 cmH20   Intake/Output  Summary (Last 24 hours) at 03/22/2020 1424 Last data filed at 03/22/2020 0800 Gross per 24 hour  Intake --  Output 825 ml  Net -825 ml   Filed Weights   03/19/20 0500 03/21/20 0500 03/22/20 0415  Weight: 92.7 kg 93 kg 90.2 kg    Examination:  General - alert, speaks in full sentences Eyes - pupils reactive ENT - no sinus tenderness, no stridor Cardiac - regular rate/rhythm, no murmur Chest - equal breath sounds b/l, no wheezing or rales Abdomen - soft, non tender, + bowel sounds Extremities - no cyanosis, clubbing, or edema Skin - no rashes Neuro - normal strength, moves extremities, follows commands Psych - normal mood and behavior   Resolved Hospital Problem list     Assessment & Plan:   Acute hypoxic respiratory failure from COVID 19 pneumonia. - completed course of remdesivir - day 13 of steroids - day 6 of baricitinib - goal SpO2 85 to 95% - bronchial hygiene, mobilize as able - monitor for signs of bacterial superinfection and thromboembolic disease - monitor need for intubation and mechanical ventilation; not needed at present  SBO/ileus. Ulcerative colitis. CAD s/p DES, HTN, HLD. DM type 2 poorly controlled with steroid induced hyperglycemia. Hyponatremia. - per primary team  Best practice (evaluated daily)  Diet: regular diet DVT prophylaxis: lovenox GI prophylaxis: protonix Mobility: OOB to chair Disposition: ICU Code Status: ICU  Labs    CMP Latest Ref Rng & Units 03/22/2020 03/21/2020  03/20/2020  Glucose 70 - 99 mg/dL 165(H) 185(H) 172(H)  BUN 8 - 23 mg/dL 14 18 14   Creatinine 0.61 - 1.24 mg/dL 0.50(L) 0.50(L) 0.54(L)  Sodium 135 - 145 mmol/L 125(L) 128(L) 126(L)  Potassium 3.5 - 5.1 mmol/L 5.8(H) 4.3 4.0  Chloride 98 - 111 mmol/L 95(L) 94(L) 91(L)  CO2 22 - 32 mmol/L 23 25 25   Calcium 8.9 - 10.3 mg/dL 7.7(L) 8.1(L) 8.2(L)  Total Protein 6.5 - 8.1 g/dL 5.4(L) 5.5(L) 6.0(L)  Total Bilirubin 0.3 - 1.2 mg/dL 2.3(H) 1.4(H) 1.5(H)  Alkaline Phos 38 -  126 U/L 59 54 61  AST 15 - 41 U/L 48(H) 22 19  ALT 0 - 44 U/L 13 16 18     CBC Latest Ref Rng & Units 03/22/2020 03/21/2020 03/20/2020  WBC 4.0 - 10.5 K/uL 16.0(H) 23.0(H) 26.6(H)  Hemoglobin 13.0 - 17.0 g/dL 14.9 15.2 16.1  Hematocrit 39.0 - 52.0 % 41.6 43.2 44.8  Platelets 150 - 400 K/uL 218 224 257    ABG    Component Value Date/Time   PHART 7.472 (H) 03/20/2020 0300   PCO2ART 36.3 03/20/2020 0300   PO2ART 75.6 (L) 03/20/2020 0300   HCO3 27.2 03/20/2020 0300   O2SAT 94.9 03/20/2020 0300    CBG (last 3)  Recent Labs    03/22/20 0434 03/22/20 0801 03/22/20 1211  GLUCAP 184* 162* 197*    Signature:  Chesley Mires, MD [place] Pager - [phone] 03/22/2020, 2:24 PM

## 2020-03-22 NOTE — Progress Notes (Addendum)
PROGRESS NOTE    Calvin Cruz  VHQ:[id number] DOB: 04-04-1960 DOA: 03/07/2020 PCP: Reynold Bowen, MD   Brief Narrative:  60 year old male with a history ofsecondarypolycythemia(JAK2 neg), hyperlipidemia, diabetes mellitus type 2, hypertension, coronary artery disease status post NSTEMI with DES to the circumflex June 2016, ulcerative colitis status post proctocolectomy with ileoanal anastomosis, and recurrent episodes of pouchitis presenting with upper abdominal pain, nausea, vomiting, and loose stools that began on 03/04/2020. He states that he normally has 5-6 loose bowel movements on average day secondary to his ulcerative colitis. He normally uses Imodium, usually 1 to 2 tablets on a daily basis. His last bowel movement was on 03/15/2020. He denies any fevers, chills, chest pain, shortness of breath, cough, hemoptysis. He denies any headache, sore throat, hemoptysis. He has had numerous episodes of emesis on 03/17/2020 resulting in dry heaving. There is no hematemesis. He denies hematochezia or melena.He has not had a bowel movement since the afternoon of 03/12/2020. In the emergency department, the patient was afebrile hemodynamically stable with oxygen saturation 94-96% on room air. BMP showed a sodium 135, potassium 4.1, BUN 14, serum creatinine 0.81. LFTs were unremarkable. Lipase 21. WBC 7.1, hemoglobin 17.7, platelets 174,000. CT of the abdomen and pelvis showed mildly dilated air-fluid filled loops of ileum up to 3.2 cm. There was no clear transition point or focal narrowing. There is no surrounding inflammatory changes. He is status post colectomy with ileal colonic anastomosis.  -Patient is being treated for acute hypoxemic respiratory failure in the setting of COVID-19 pneumonia and continues to struggle with high oxygen requirements.  Pulmonology assisting with management.  Poor nutritional status noted after recent SBO/ileus and now with poor oral intake.  Plan for  PICC line placement and TPN to initiate after dietitian evaluation performed on 1/31.  Assessment & Plan:   Principal Problem:   Partial small bowel obstruction (HCC) Active Problems:   Hyperlipidemia with target LDL less than 70   Essential hypertension   CAD S/P DES PCI-circumflex   Abdominal pain   COVID-19 virus infection   Hyperglycemia due to diabetes mellitus (HCC)   Nausea and vomiting   Gastroenteritis due to COVID-19 virus   Abdominal distention   Hypoxemia   SOB (shortness of breath)   Acute respiratory failure with hypoxia due toCOVID-19pneumonia;patient also with COVID-19gastroenteritis/Infection -Continue steroids and baricitinib -Completed remdesivir -Procalcitonin lowand repeat chest x-ray stable on 1/30, BNP low as well -Elevated D-dimers noted on 1/28 for which CT angiogram for PE study ordered-negative -Plan to allow wife to visit to increase mobilization -Appreciate Pulmonology recommendations starting 1/31 -Wean O2 as tolerated  Partial small bowel obstruction-resolved -Patient currently onregular diet and tolerating -continue judicious use of narcotics; goal is not for pain free; but to assist tolerating symptoms.  -Continue the use of levsin as needed . -Continue PRN antiemetics.  -Will continue to follow recommendations by GI serviceprn -Patient also with improvement in his distention, having bowel sounds and had experienced multiple bowel movements overnight. No nausea or vomiting currently. -Patient has been instructed to increase physical activity. -Plan to slowly wean fentanyl use -Dietician consultation appreciated 1/31 -Plan for PICC line and TPN initiation today  Delirium/agitation -Haldol ordered as needed -Allowing wife to visit  Hyponatremia-likely related to SIADH -TSH within normal limits and serum osmolarity 272 -Plan to fluid restrict -Changed to regular diet -IV normal saline added -Monitor in a.m. -Should improve  further with TPN, continue to monitor  Coronary artery disease with history of NSTEMI -No chest  pain presently -Restart Brilinta when able to fully tolerate p.o. -Patient follows with Dr. Glenetta Hew -Beta-blocker has been resumed.  Diabetes mellitus type 2, controlled -Restart Jardianceoutpatient -Continue sliding scale insulin while inpatient. -03/07/20 A1C--6.7 -Treatment for COVID infection using the steroids has been started; CBGs expected to get elevated -Will add low-dose Lantus to assist with management.  Hyperlipidemia -Restart Crestor and Vascepa once oral intake adequate  Pituitary Tumor  -Found in 08/28/16 Brain MRI  -Will continue outpatient follow up with Dr. Forde Dandy.   Status is: Inpatient  Remains inpatient appropriate because: IV treatments appropriate due to intensity of illness or inability to take PO   Dispo: The patient is from:Home Anticipated d/c is to:SNF Anticipated d/c date is:To be determined Patient currently is not medically stable to d/c.   Family Communication:spouse updated 03/21/20, tried calling on 2/1 with no response  Consultants:GI, general surgery, Pulmonology  Code Status: FULL  DVT Prophylaxis: [place] Lovenox, now BID starting 1/30   Procedures: As Listed in Progress Note Above  Antimicrobials:  Anti-infectives (From admission, onward)   Start     Dose/Rate Route Frequency Ordered Stop   03/11/20 1000  remdesivir 100 mg in sodium chloride 0.9 % 100 mL IVPB        100 mg 200 mL/hr over 30 Minutes Intravenous Daily 03/10/20 1105 03/14/20 0900   03/10/20 1200  remdesivir 100 mg in sodium chloride 0.9 % 100 mL IVPB        100 mg 200 mL/hr over 30 Minutes Intravenous Every 1 hr x 2 03/10/20 1105 03/10/20 1320       Subjective: Patient seen and evaluated today with no new acute complaints or concerns. No acute concerns or events noted overnight.  Overall breathing  appears to be improved.  Objective: Vitals:   03/22/20 1000 03/22/20 1100 03/22/20 1244 03/22/20 1427  BP: 127/74 120/64    Pulse: 93 96    Resp: (!) 27 17    Temp:   97.6 F (36.4 C)   TempSrc:   Axillary   SpO2: 95% 92%  93%  Weight:      Height:        Intake/Output Summary (Last 24 hours) at 03/22/2020 1448 Last data filed at 03/22/2020 0800 Gross per 24 hour  Intake -  Output 825 ml  Net -825 ml   Filed Weights   03/19/20 0500 03/21/20 0500 03/22/20 0415  Weight: 92.7 kg 93 kg 90.2 kg    Examination:  General exam: Appears calm and comfortable  Respiratory system: Clear to auscultation. Respiratory effort normal.  Currently on 35 L nasal cannula. Cardiovascular system: S1 & S2 heard, RRR.  Gastrointestinal system: Abdomen is soft Central nervous system: Alert and awake Extremities: No edema Skin: No significant lesions noted Psychiatry: Flat affect.    Data Reviewed: I have personally reviewed following labs and imaging studies  CBC: Recent Labs  Lab 03/18/20 0523 03/19/20 0631 03/20/20 0500 03/21/20 0600 03/22/20 0814  WBC 19.4* 22.8* 26.6* 23.0* 16.0*  NEUTROABS  --   --   --   --  15.2*  HGB 16.6 16.3 16.1 15.2 14.9  HCT 47.1 45.2 44.8 43.2 41.6  MCV 88.5 87.9 88.0 88.9 88.9  PLT 223 238 257 224 932   Basic Metabolic Panel: Recent Labs  Lab 03/17/20 0435 03/18/20 0523 03/19/20 0631 03/20/20 0500 03/21/20 0600 03/22/20 0537  NA 127* 126* 128* 126* 128* 125*  K 4.3 4.3 4.2 4.0 4.3 5.8*  CL 89* 90* 94*  91* 94* 95*  CO2 26 23 26 25 25 23   GLUCOSE 149* 184* 160* 172* 185* 165*  BUN 14 16 14 14 18 14   CREATININE 0.51* 0.58* 0.47* 0.54* 0.50* 0.50*  CALCIUM 8.6* 8.1* 8.2* 8.2* 8.1* 7.7*  MG 1.9 1.9  --   --   --  2.1   GFR: Estimated Creatinine Clearance: 123.7 mL/min (A) (by C-G formula based on SCr of 0.5 mg/dL (L)). Liver Function Tests: Recent Labs  Lab 03/18/20 0523 03/19/20 0631 03/20/20 0500 03/21/20 0600 03/22/20 0537  AST 25  20 19 22  48*  ALT 21 18 18 16 13   ALKPHOS 65 67 61 54 59  BILITOT 1.8* 1.5* 1.5* 1.4* 2.3*  PROT 6.0* 6.1* 6.0* 5.5* 5.4*  ALBUMIN 2.5* 2.5* 2.5* 2.2* 2.2*   No results for input(s): LIPASE, AMYLASE in the last 168 hours. No results for input(s): AMMONIA in the last 168 hours. Coagulation Profile: No results for input(s): INR, PROTIME in the last 168 hours. Cardiac Enzymes: No results for input(s): CKTOTAL, CKMB, CKMBINDEX, TROPONINI in the last 168 hours. BNP (last 3 results) No results for input(s): PROBNP in the last 8760 hours. HbA1C: No results for input(s): HGBA1C in the last 72 hours. CBG: Recent Labs  Lab 03/21/20 1936 03/21/20 2312 03/22/20 0434 03/22/20 0801 03/22/20 1211  GLUCAP 225* 204* 184* 162* 197*   Lipid Profile: No results for input(s): CHOL, HDL, LDLCALC, TRIG, CHOLHDL, LDLDIRECT in the last 72 hours. Thyroid Function Tests: No results for input(s): TSH, T4TOTAL, FREET4, T3FREE, THYROIDAB in the last 72 hours. Anemia Panel: Recent Labs    03/21/20 0600 03/22/20 0537  FERRITIN 2,112* 2,272*   Sepsis Labs: Recent Labs  Lab 03/16/20 1454 03/20/20 0700  PROCALCITON <0.10 0.16    No results found for this or any previous visit (from the past 240 hour(s)).       Radiology Studies: [place] EKG SITE RITE  Result Date: 03/22/2020 If Site Rite image not attached, placement could not be confirmed due to current cardiac rhythm.       Scheduled Meds: . vitamin C  1,000 mg Oral Daily  . baricitinib  4 mg Oral Daily  . Chlorhexidine Gluconate Cloth  6 each Topical Daily  . cholecalciferol  5,000 Units Oral Daily  . diclofenac Sodium  4 g Topical QID  . enoxaparin (LOVENOX) injection  40 mg Subcutaneous Q12H  . insulin aspart  0-15 Units Subcutaneous Q4H  . insulin glargine  12 Units Subcutaneous QHS  . Ipratropium-Albuterol  1 puff Inhalation TID  . lidocaine  1 patch Transdermal Q24H  . methylPREDNISolone (SOLU-MEDROL) injection  90 mg  Intravenous Q12H  . metoprolol tartrate  2.5 mg Intravenous Q8H  . ondansetron (ZOFRAN) IV  4 mg Intravenous Q6H  . pantoprazole  40 mg Oral BID AC  . zinc sulfate  220 mg Oral Daily   Continuous Infusions: . sodium chloride    . TPN ADULT (ION)       LOS: 16 days    Time spent: 35 minutes    Karalyne Nusser Darleen Crocker, DO [facility]  If 7PM-7AM, please contact night-coverage [email or web address] 03/22/2020, 2:48 PM

## 2020-03-22 NOTE — Progress Notes (Signed)
PT Cancellation Note  Patient Details Name: CRISPIN VOGEL MRN: [id number] DOB: [birth year]   Cancelled Treatment:    Reason Eval/Treat Not Completed: Patient at procedure or test/unavailable.  Patient having 1 hour procedure, will check back tomorrow.   3:24 PM, 03/22/20 Lonell Grandchild, MPT Physical Therapist with [facility] [phone] office [phone] mobile phone

## 2020-03-22 NOTE — Progress Notes (Signed)
PHARMACY - TOTAL PARENTERAL NUTRITION CONSULT NOTE   Indication: Small bowel obstruction/ Unable to feed by NGT .   Patient Measurements: Height: 6\' 6"  (198.1 cm) Weight: 90.2 kg (198 lb 13.7 oz) IBW/kg (Calculated) : 91.4 TPN AdjBW (KG): 108.9 Body mass index is 22.98 kg/m. Usual Weight: 108.9 kg on admission, 90kg now  Assessment:  Patient is a 60 yo male with history of Ulcerative colitis s/p ileal anastomosis /colectomy, gout, CAD, DM2 and HTN. He presents with small bowel obstruction and COVID-19 pneumonia and gastroenteritis/infection.  SBO improved and on regular diet but unable to tolerate. Abdomen still distended.  Patient difficult NGT placement due to previous nasal surgeries. Concern with possible refeeding syndrome(will hold lipids since in ICU). Start at lower rate today to ensure tolerates. No K today since elevated with labs this AM  Glucose / Insulin: 168-225/ 22 units insulin Electrolytes: K 5.8, others WNL Renal: 0.5, stable LFTs / TGs: WNL.  Prealbumin / albumin: Alb 2.2 Intake / Output; MIVF: 1800 IV/ 1132mls out GI Imaging: SBO resolved, but unable to eat still since 1/26 Surgeries / Procedures: none this admission  Central access: 03/22/2020 TPN start date: 03/22/2020  Nutritional Goals (per RD recommendation on 1/31): kCal: 2418-2604, Protein: 140-167g Fluid: per MD 244mls/day  Goal TPN rate is 100 mL/hr (provides 91 g of protein and 432g of CHO for 1833 kcals per day, can increase when add lipids)  Current Nutrition:  Low residue diet, but not tolerating  Plan:  Start TPN at 42mL/hr at 1800 Electrolytes in TPN: 15mEq/L of Na, 0 mEq/L of K, 45mEq/L of Ca, 90mEq/L of Mg, and 64mmol/L of Phos. Cl:Ac ratio 1:1 Add standard MVI and trace elements to TPN Initiate Moderate q4h SSI and adjust as needed  Reduce MIVF to 50 mL/hr at 1800 Monitor TPN labs on Mon/Thurs  Isac Sarna, BS Pharm D, BCPS Clinical Pharmacist Pager [phone] 03/22/2020,10:38 AM

## 2020-03-22 DEATH — deceased

## 2020-03-23 DIAGNOSIS — I2510 Atherosclerotic heart disease of native coronary artery without angina pectoris: Secondary | ICD-10-CM | POA: Diagnosis not present

## 2020-03-23 DIAGNOSIS — U071 COVID-19: Secondary | ICD-10-CM | POA: Diagnosis not present

## 2020-03-23 DIAGNOSIS — J9601 Acute respiratory failure with hypoxia: Secondary | ICD-10-CM | POA: Diagnosis not present

## 2020-03-23 DIAGNOSIS — K56600 Partial intestinal obstruction, unspecified as to cause: Secondary | ICD-10-CM | POA: Diagnosis not present

## 2020-03-23 LAB — PREALBUMIN: Prealbumin: 13.9 mg/dL — ABNORMAL LOW (ref 18–38)

## 2020-03-23 LAB — COMPREHENSIVE METABOLIC PANEL
ALT: 14 U/L (ref 0–44)
AST: 17 U/L (ref 15–41)
Albumin: 2.2 g/dL — ABNORMAL LOW (ref 3.5–5.0)
Alkaline Phosphatase: 53 U/L (ref 38–126)
Anion gap: 8 (ref 5–15)
BUN: 15 mg/dL (ref 8–23)
CO2: 28 mmol/L (ref 22–32)
Calcium: 8 mg/dL — ABNORMAL LOW (ref 8.9–10.3)
Chloride: 92 mmol/L — ABNORMAL LOW (ref 98–111)
Creatinine, Ser: 0.36 mg/dL — ABNORMAL LOW (ref 0.61–1.24)
GFR, Estimated: >60 mL/min (ref >60)
Glucose, Bld: 183 mg/dL — ABNORMAL HIGH (ref 70–99)
Potassium: 4.4 mmol/L (ref 3.5–5.1)
Sodium: 128 mmol/L — ABNORMAL LOW (ref 135–145)
Total Bilirubin: 0.9 mg/dL (ref 0.3–1.2)
Total Protein: 5.6 g/dL — ABNORMAL LOW (ref 6.5–8.1)

## 2020-03-23 LAB — CBC WITH DIFFERENTIAL/PLATELET
Abs Immature Granulocytes: 0.1 10*3/uL — ABNORMAL HIGH (ref 0.00–0.07)
Basophils Absolute: 0 10*3/uL (ref 0.0–0.1)
Basophils Relative: 0 %
Eosinophils Absolute: 0 10*3/uL (ref 0.0–0.5)
Eosinophils Relative: 0 %
HCT: 41.3 % (ref 39.0–52.0)
Hemoglobin: 14.6 g/dL (ref 13.0–17.0)
Immature Granulocytes: 1 %
Lymphocytes Relative: 3 %
Lymphs Abs: 0.5 10*3/uL — ABNORMAL LOW (ref 0.7–4.0)
MCH: 31.5 pg (ref 26.0–34.0)
MCHC: 35.4 g/dL (ref 30.0–36.0)
MCV: 89 fL (ref 80.0–100.0)
Monocytes Absolute: 0.2 10*3/uL (ref 0.1–1.0)
Monocytes Relative: 1 %
Neutro Abs: 14.8 10*3/uL — ABNORMAL HIGH (ref 1.7–7.7)
Neutrophils Relative %: 95 %
Platelets: 270 10*3/uL (ref 150–400)
RBC: 4.64 MIL/uL (ref 4.22–5.81)
RDW: 11.6 % (ref 11.5–15.5)
WBC: 15.6 10*3/uL — ABNORMAL HIGH (ref 4.0–10.5)
nRBC: 0 % (ref 0.0–0.2)

## 2020-03-23 LAB — D-DIMER, QUANTITATIVE: D-Dimer, Quant: 3.05 ug/mL-FEU — ABNORMAL HIGH (ref 0.00–0.50)

## 2020-03-23 LAB — URINALYSIS, ROUTINE W REFLEX MICROSCOPIC
Bilirubin Urine: NEGATIVE
Glucose, UA: NEGATIVE mg/dL
Hgb urine dipstick: NEGATIVE
Ketones, ur: 5 mg/dL — AB
Nitrite: NEGATIVE
Protein, ur: NEGATIVE mg/dL
Specific Gravity, Urine: 1.019 (ref 1.005–1.030)
WBC, UA: >50 WBC/hpf — ABNORMAL HIGH (ref 0–5)
pH: 6 (ref 5.0–8.0)

## 2020-03-23 LAB — GLUCOSE, CAPILLARY
Glucose-Capillary: 170 mg/dL — ABNORMAL HIGH (ref 70–99)
Glucose-Capillary: 211 mg/dL — ABNORMAL HIGH (ref 70–99)
Glucose-Capillary: 241 mg/dL — ABNORMAL HIGH (ref 70–99)
Glucose-Capillary: 252 mg/dL — ABNORMAL HIGH (ref 70–99)
Glucose-Capillary: 326 mg/dL — ABNORMAL HIGH (ref 70–99)

## 2020-03-23 LAB — C-REACTIVE PROTEIN: CRP: 2.9 mg/dL — ABNORMAL HIGH (ref <1.0)

## 2020-03-23 LAB — TRIGLYCERIDES: Triglycerides: 188 mg/dL — ABNORMAL HIGH (ref <150)

## 2020-03-23 LAB — FERRITIN: Ferritin: 2007 ng/mL — ABNORMAL HIGH (ref 24–336)

## 2020-03-23 LAB — PHOSPHORUS: Phosphorus: 2.9 mg/dL (ref 2.5–4.6)

## 2020-03-23 LAB — MAGNESIUM: Magnesium: 2.1 mg/dL (ref 1.7–2.4)

## 2020-03-23 MED ORDER — TRAVASOL 10 % IV SOLN: INTRAVENOUS | Status: DC

## 2020-03-23 MED ORDER — TRACE MINERALS CU-MN-SE-ZN 300-55-60-3000 MCG/ML IV SOLN
INTRAVENOUS | Status: AC
  Filled 2020-03-23: qty 480

## 2020-03-23 MED ORDER — METOPROLOL TARTRATE 5 MG/5ML IV SOLN
5.0000 mg | Freq: Two times a day (BID) | INTRAVENOUS | Status: DC
  Administered 2020-03-23 – 2020-03-26 (×8): 5 mg via INTRAVENOUS
  Filled 2020-03-23 (×9): qty 5

## 2020-03-23 NOTE — Progress Notes (Addendum)
PHARMACY - TOTAL PARENTERAL NUTRITION CONSULT NOTE   Indication: Small bowel obstruction/ Unable to feed by NGT .   Patient Measurements: Height: 6\' 6"  (198.1 cm) Weight: 89.3 kg (196 lb 13.9 oz) IBW/kg (Calculated) : 91.4 TPN AdjBW (KG): 108.9 Body mass index is 22.75 kg/m. Usual Weight: 108.9 kg on admission, 90kg now  Assessment:  Patient is a 60 yo male with history of Ulcerative colitis s/p ileal anastomosis /colectomy, gout, CAD, DM2 and HTN. He presents with small bowel obstruction and COVID-19 pneumonia and gastroenteritis/infection.  SBO improved and on regular diet but unable to tolerate. Abdomen still distended.  Patient difficult NGT placement due to previous nasal surgeries. Concern with possible refeeding syndrome(will hold lipids since in ICU). Start at lower rate today to ensure tolerates. No K today since elevated with labs this AM  Glucose / Insulin: 168-225/14 units insulin Electrolytes: Na 128 Renal: stable LFTs / TGs: TG 188 Prealbumin / albumin: Alb 2.2 Intake / Output; MIVF: 1800 IV/ 1145mls out GI Imaging: SBO resolved, but unable to eat still since 1/26 Surgeries / Procedures: none this admission  Central access: 03/22/2020 TPN start date: 03/22/2020  Nutritional Goals (per RD recommendation on 1/31): kCal: 1552-0802, Protein: 140-167g Fluid: per MD 2479mls/day  Goal TPN rate is 100 mL/hr (provides 144 g of protein 1963 kcals per day, can increase when add lipids)   Current Nutrition:  Low residue diet, but not tolerating   Plan:   TPN from yesterday not started until 2/2 0900 so will reorder 1/2 rate TPN again for today  Start TPN at 50 mL/hr at 1800 Holding lipids at this time due to shortage- F/U initiation if needed. Electrolytes in TPN: 40mEq/L of Na, 40 mEq/L of K, 49mEq/L of Ca, 73mEq/L of Mg, and 51mmol/L of Phos. Cl:Ac ratio 1:1 Add standard MVI and trace elements to TPN Initiate Moderate q4h SSI and adjust as needed   MIVF to 50 mL/hr  Monitor  TPN labs on Mon/Thurs  Margot Ables, PharmD Clinical Pharmacist 03/23/2020 10:54 AM

## 2020-03-23 NOTE — Progress Notes (Signed)
Physical Therapy Treatment Patient Details Name: Calvin Cruz MRN: [id number] DOB: 24-Nov-1960 Today's Date: 03/23/2020    History of Present Illness Calvin Cruz is a 60 y.o. male with medical history significant for hx of ulcerative colitis, s/p colectomy with Hartman's pouch formation,  gout, NSTEMI, CAD s/p DES June 2016, hypertension and hyperlipidemia who presents to the emergency department due to 1 day onset of lower abdominal pain with associated nausea and vomiting.  Pain was described as sharp, intermittent and rated as 7-8/10 on pain scale, he also complained of nonbloody but bilious like vomiting of several episodes since onset of symptoms.  No alleviating/aggravating factors known, symptoms are similar to prior small bowel obstruction (last SBO was about a year ago per patient).  Last bowel movement was yesterday (1/15) in the morning.  He denies fever, chills, chest pain, shortness of breath, diarrhea or any sick contact.  Patient states that he never had COVID-vaccine    PT Comments    Patient presents on 40 liters HFNC and demonstrates slow labored movement for sitting up at bedside, once seated has to frequently lean on left elbow for support due to fatigue when completing BLE exercises requiring frequent rest breaks.  Patient required [facility] raised to complete sit to stands and limited to 10 steps marching in place at bedside before having to sit due to fatigue/SOB.  Patient tolerated sitting up in chair after therapy - RN notified.  Patient will benefit from continued physical therapy in hospital and recommended venue below to increase strength, balance, endurance for safe ADLs and gait.     Follow Up Recommendations  Home health PT;Supervision for mobility/OOB;Supervision - Intermittent     Equipment Recommendations  Rolling walker with 5" wheels    Recommendations for Other Services       Precautions / Restrictions Precautions Precautions: Fall Restrictions Weight  Bearing Restrictions: No    Mobility  Bed Mobility Overal bed mobility: Needs Assistance Bed Mobility: Supine to Sit     Supine to sit: Supervision;Min guard;HOB elevated     General bed mobility comments: increased time, labored movement  Transfers Overall transfer level: Needs assistance Equipment used: Rolling walker (2 wheeled) Transfers: Sit to/from [facility] Sit to Stand: Min assist;From elevated surface Stand pivot transfers: Min assist;From elevated surface       General transfer comment: diffiuclty completing sit to stands due to BLE weakness  Ambulation/Gait Ambulation/Gait assistance: Min assist;Mod assist Gait Distance (Feet): 10 Feet Assistive device: Rolling walker (2 wheeled) Gait Pattern/deviations: Decreased step length - right;Decreased step length - left;Decreased stride length Gait velocity: decreased   General Gait Details: tolerated up to 10 - 12 slow labored steps marching in place before having to sit due to fatigue, SOB   Stairs             Wheelchair Mobility    Modified Rankin (Stroke Patients Only)       Balance Overall balance assessment: Needs assistance Sitting-balance support: Feet supported;No upper extremity supported Sitting balance-Leahy Scale: Fair Sitting balance - Comments: seated at EOB, frequent leaning on left elbow due to fatigue   Standing balance support: During functional activity;Bilateral upper extremity supported Standing balance-Leahy Scale: Fair Standing balance comment: using RW                            Cognition Arousal/Alertness: Awake/alert Behavior During Therapy: WFL for tasks assessed/performed Overall Cognitive Status: Within Functional Limits for tasks  assessed                                        Exercises General Exercises - Lower Extremity Long Arc Quad: Seated;AROM;Strengthening;Both;10 reps Hip Flexion/Marching:  Seated;AROM;Strengthening;AAROM;Both;10 reps Toe Raises: Seated;AROM;Strengthening;Both;10 reps Heel Raises: Seated;AROM;Strengthening;Both;10 reps    General Comments        Pertinent Vitals/Pain Pain Assessment: No/denies pain    Home Living                      Prior Function            PT Goals (current goals can now be found in the care plan section) Acute Rehab PT Goals Patient Stated Goal: return home with family to assist PT Goal Formulation: With patient Time For Goal Achievement: 04/06/20 Potential to Achieve Goals: Good Progress towards PT goals: Progressing toward goals    Frequency    Min 3X/week      PT Plan      Co-evaluation              AM-PAC PT "6 Clicks" Mobility   Outcome Measure  Help needed turning from your back to your side while in a flat bed without using bedrails?: None Help needed moving from lying on your back to sitting on the side of a flat bed without using bedrails?: A Little Help needed moving to and from a bed to a chair (including a wheelchair)?: A Lot Help needed standing up from a chair using your arms (e.g., wheelchair or bedside chair)?: A Little Help needed to walk in hospital room?: A Lot Help needed climbing 3-5 steps with a railing? : A Lot 6 Click Score: 16    End of Session Equipment Utilized During Treatment: Oxygen Activity Tolerance: Patient tolerated treatment well;Patient limited by fatigue Patient left: in chair;with call bell/phone within reach Nurse Communication: Mobility status PT Visit Diagnosis: Unsteadiness on feet (R26.81);Other abnormalities of gait and mobility (R26.89);Muscle weakness (generalized) (M62.81)     Time: 3790-2409 PT Time Calculation (min) (ACUTE ONLY): 25 min  Charges:  $Therapeutic Exercise: 8-22 mins $Therapeutic Activity: 8-22 mins                     2:12 PM, 03/23/20 Lonell Grandchild, MPT Physical Therapist with [facility] [phone]  office [phone] mobile phone

## 2020-03-23 NOTE — Progress Notes (Signed)
PROGRESS NOTE    Calvin Cruz  QVZ:[id number] DOB: 12/09/1960 DOA: March 31, 2020 PCP: Reynold Bowen, MD   Brief Narrative:  60 year old male with a history ofsecondarypolycythemia(JAK2 neg), hyperlipidemia, diabetes mellitus type 2, hypertension, coronary artery disease status post NSTEMI with DES to the circumflex June 2016, ulcerative colitis status post proctocolectomy with ileoanal anastomosis, and recurrent episodes of pouchitis presenting with upper abdominal pain, nausea, vomiting, and loose stools that began on 03/04/2020. He states that he normally has 5-6 loose bowel movements on average day secondary to his ulcerative colitis. He normally uses Imodium, usually 1 to 2 tablets on a daily basis. His last bowel movement was on 2020/03/31. He denies any fevers, chills, chest pain, shortness of breath, cough, hemoptysis. He denies any headache, sore throat, hemoptysis. He has had numerous episodes of emesis on 03-31-20 resulting in dry heaving. There is no hematemesis. He denies hematochezia or melena.He has not had a bowel movement since the afternoon of 03/12/2020. In the emergency department, the patient was afebrile hemodynamically stable with oxygen saturation 94-96% on room air. BMP showed a sodium 135, potassium 4.1, BUN 14, serum creatinine 0.81. LFTs were unremarkable. Lipase 21. WBC 7.1, hemoglobin 17.7, platelets 174,000. CT of the abdomen and pelvis showed mildly dilated air-fluid filled loops of ileum up to 3.2 cm. There was no clear transition point or focal narrowing. There is no surrounding inflammatory changes. He is status post colectomy with ileal colonic anastomosis.  -Patient is being treated for acute hypoxemic respiratory failure in the setting of COVID-19 pneumonia and continues to struggle with high oxygen requirements.  Pulmonology assisting with management.  Poor nutritional status noted after recent SBO/ileus and now with poor oral intake.  Plan for  PICC line placement and TPN to initiate after dietitian evaluation performed on 1/31.  Assessment & Plan:   Principal Problem:   Partial small bowel obstruction (HCC) Active Problems:   Hyperlipidemia with target LDL less than 70   Essential hypertension   CAD S/P DES PCI-circumflex   Abdominal pain   COVID-19 virus infection   Hyperglycemia due to diabetes mellitus (HCC)   Nausea and vomiting   Gastroenteritis due to COVID-19 virus   Abdominal distention   Hypoxemia   SOB (shortness of breath)   Acute respiratory failure with hypoxia due toCOVID-19pneumonia;patient also with COVID-19gastroenteritis/Infection -Continue steroids and baricitinib (last one day 7/14) -Completed remdesivir -Procalcitonin lowand repeat chest x-ray stable on 1/30, BNP low as well. Continue holding on use of diuretics or antibiotics currently. -Elevated D-dimers noted on 1/28 for which CT angiogram for PE study was ordered, neg results. -Plan to allow wife to visit to increase mobilization and compliance with resp activities. -Appreciate Pulmonology recommendations -continue to Wean O2 as tolerated  Partial small bowel obstruction-resolved -Patient currently onregular diet and tolerating -continue judicious use of narcotics; goal is not for pain free; but to assist tolerating symptoms.  -Continue the use of levsin as needed . -Continue PRN antiemetics.  -Will continue to follow recommendations by GI serviceprn -Patient also with improvement in his distention, having bowel sounds and had experienced multiple bowel movements overnight. No nausea or vomiting currently. -Patient has been instructed to increase physical activity. -Plan to slowly wean fentanyl use -Dietician consultation appreciated 1/31 -Plan for PICC line and TPN initiation; started on 03/22/20  Delirium/agitation -Haldol ordered as needed -Allowing wife to visit -continue constant reorientation -will check UA and urine  culture. -patient denies dysuria.  Hyponatremia-likely related to SIADH and poor solute intake. -TSH within  normal limits and serum osmolarity 272 -gentle fluid restriction planned. -follow electrolytes trend  -Should improve further with TPN, continue to monitor  Coronary artery disease with history of NSTEMI -No chest pain presently -Restart Brilinta when able to fully tolerate p.o. -Patient follows with Dr. Glenetta Hew -Beta-blocker has been resumed and dose adjusted.  Nonsustained ventricular tachycardia -As mentioned above beta-blocker dose has been adjusted -Following recommendations by cardiology service 2D echo will be repeated. -Continue to follow electrolytes and replete as needed.  Diabetes mellitus type 2, controlled -Restart Jardianceas an outpatient -Continue sliding scale insulin while inpatient. -03/07/20 A1C--6.7 -Treatment for COVID infection using steroids contributing to elevated CBGs while inpatient. -Continue Lantus and further adjust based on CBGs fluctuation.  Hyperlipidemia -Restart Crestor and Vascepa once oral intake adequate.  Pituitary Tumor  -Found in 08/28/16 Brain MRI  -Will continue outpatient follow up with Dr. Forde Dandy.   Status is: Inpatient  Remains inpatient appropriate because: IV treatments appropriate due to intensity of illness or inability to take PO   Dispo: The patient is from:Home Anticipated d/c is to:SNF Anticipated d/c date is:To be determined Patient currently is not medically stable to d/c.  Still requiring significant amount of oxygen supplementation.  Physical therapy recommending home health services.   Family Communication:spouse updated by pulmonologist at bedside. 03/23/20  Consultants:GI, general surgery, Pulmonology  Code Status: FULL  DVT Prophylaxis: Lovenox   Procedures: As Listed in Progress Note Above  Antimicrobials:   Anti-infectives (From admission, onward)   Start     Dose/Rate Route Frequency Ordered Stop   03/11/20 1000  remdesivir 100 mg in sodium chloride 0.9 % 100 mL IVPB        100 mg 200 mL/hr over 30 Minutes Intravenous Daily 03/10/20 1105 03/14/20 0900   03/10/20 1200  remdesivir 100 mg in sodium chloride 0.9 % 100 mL IVPB        100 mg 200 mL/hr over 30 Minutes Intravenous Every 1 hr x 2 03/10/20 1105 03/10/20 1320      Subjective: No fever, no chest pain, no nausea or vomiting.  Positive bowel movement reported.  No abdominal pain.  Continues to be short winded with minimal exertion, feeling weak, tired/deconditioned and requiring significant amount of oxygen supplementation.  Overnight tolerating BiPAP without problems.  Objective: Vitals:   03/23/20 1924 03/23/20 2000 03/23/20 2007 03/23/20 2059  BP:  140/78    Pulse: 96 100 97   Resp: 16 (!) 34 (!) 21   Temp:      TempSrc:      SpO2: 96% 94% 93% 96%  Weight:      Height:        Intake/Output Summary (Last 24 hours) at 03/23/2020 2148 Last data filed at 03/23/2020 1600 Gross per 24 hour  Intake 493.33 ml  Output 1350 ml  Net -856.67 ml   Filed Weights   03/21/20 0500 03/22/20 0415 03/23/20 0335  Weight: 93 kg 90.2 kg 89.3 kg    Examination: General exam: Alert, awake, oriented x 3; no chest pain, no nausea, no vomiting.  Continues to require significant amount of oxygen supplementation and expressed feeling short winded with minimal activity.  Reports having bowel movement.  No abdominal pain currently. Respiratory system: Positive tachypnea, bibasilar crackles appreciated; no wheezing. Cardiovascular system: Sinus rhythm, no rubs, no gallops, no JVD.   Gastrointestinal system: Abdomen is nondistended, soft and nontender on palpation. No organomegaly or masses felt.  Positive bowel sounds. Central nervous system: Alert and oriented. No  focal neurological deficits.  Moving 4 limbs spontaneously.  Following commands  appropriately. Extremities: No cyanosis, clubbing or edema. Skin: No petechiae. Psychiatry: Judgement and insight appear normal.  Flat affect.  Data Reviewed: I have personally reviewed following labs and imaging studies  CBC: Recent Labs  Lab 03/19/20 0631 03/20/20 0500 03/21/20 0600 03/22/20 0814 03/23/20 0457  WBC 22.8* 26.6* 23.0* 16.0* 15.6*  NEUTROABS  --   --   --  15.2* 14.8*  HGB 16.3 16.1 15.2 14.9 14.6  HCT 45.2 44.8 43.2 41.6 41.3  MCV 87.9 88.0 88.9 88.9 89.0  PLT 238 257 224 218 [id number]   Basic Metabolic Panel: Recent Labs  Lab 03/17/20 0435 03/18/20 0523 03/19/20 0631 03/20/20 0500 03/21/20 0600 03/22/20 0537 03/22/20 1255 03/23/20 0457  NA 127* 126* 128* 126* 128* 125*  --  128*  K 4.3 4.3 4.2 4.0 4.3 5.8* 4.4 4.4  CL 89* 90* 94* 91* 94* 95*  --  92*  CO2 26 23 26 25 25 23   --  28  GLUCOSE 149* 184* 160* 172* 185* 165*  --  183*  BUN 14 16 14 14 18 14   --  15  CREATININE 0.51* 0.58* 0.47* 0.54* 0.50* 0.50*  --  0.36*  CALCIUM 8.6* 8.1* 8.2* 8.2* 8.1* 7.7*  --  8.0*  MG 1.9 1.9  --   --   --  2.1  --  2.1  PHOS  --   --   --   --   --   --   --  2.9   GFR: Estimated Creatinine Clearance: 122.5 mL/min (A) (by C-G formula based on SCr of 0.36 mg/dL (L)).   Liver Function Tests: Recent Labs  Lab 03/19/20 0631 03/20/20 0500 03/21/20 0600 03/22/20 0537 03/23/20 0457  AST 20 19 22  48* 17  ALT 18 18 16 13 14   ALKPHOS 67 61 54 59 53  BILITOT 1.5* 1.5* 1.4* 2.3* 0.9  PROT 6.1* 6.0* 5.5* 5.4* 5.6*  ALBUMIN 2.5* 2.5* 2.2* 2.2* 2.2*   CBG: Recent Labs  Lab 03/22/20 2351 03/23/20 0359 03/23/20 1140 03/23/20 1802 03/23/20 2029  GLUCAP 165* 170* 252* 326* 241*   Lipid Profile: Recent Labs    03/23/20 0457  TRIG 188*   Thyroid Function Tests: No results for input(s): TSH, T4TOTAL, FREET4, T3FREE, THYROIDAB in the last 72 hours. Anemia Panel: Recent Labs    03/22/20 0537 03/23/20 0457  FERRITIN 2,272* 2,007*   Sepsis Labs: Recent Labs   Lab 03/20/20 0700  PROCALCITON 0.16    No results found for this or any previous visit (from the past 240 hour(s)).    Radiology Studies: [place] EKG SITE RITE  Result Date: 03/22/2020 If Site Rite image not attached, placement could not be confirmed due to current cardiac rhythm.   Scheduled Meds: . vitamin C  1,000 mg Oral Daily  . baricitinib  4 mg Oral Daily  . Chlorhexidine Gluconate Cloth  6 each Topical Daily  . cholecalciferol  5,000 Units Oral Daily  . diclofenac Sodium  4 g Topical QID  . enoxaparin (LOVENOX) injection  40 mg Subcutaneous Q12H  . insulin aspart  0-15 Units Subcutaneous Q4H  . insulin glargine  12 Units Subcutaneous QHS  . Ipratropium-Albuterol  1 puff Inhalation TID  . lidocaine  1 patch Transdermal Q24H  . methylPREDNISolone (SOLU-MEDROL) injection  90 mg Intravenous Q12H  . metoprolol tartrate  5 mg Intravenous Q12H  . ondansetron (ZOFRAN) IV  4 mg Intravenous Q6H  .  pantoprazole  40 mg Oral BID AC  . sodium chloride flush  10-40 mL Intracatheter Q12H  . zinc sulfate  220 mg Oral Daily   Continuous Infusions: . sodium chloride 50 mL/hr at 03/22/20 1757  . TPN ADULT (ION) 50 mL/hr at 03/23/20 1807     LOS: 17 days    Time spent: 35 minutes    Barton Dubois, MD [facility]  If 7PM-7AM, please contact night-coverage [email or web address] 03/23/2020, 9:48 PM

## 2020-03-23 NOTE — Progress Notes (Signed)
NAME:  Calvin Cruz, MRN:  [id number], DOB:  Feb 20, 1960, LOS: 59 ADMISSION DATE:  03-16-20, CONSULTATION DATE:  03/21/2020 REFERRING MD:  Dr. Manuella Ghazi, [facility], CHIEF COMPLAINT:  Short of breath   Brief History:  60 yo male former smoker presented to ER on March 16, 2020 with nausea, vomiting and diarrhea.  He has not received COVID vaccination.  He was found to have COVID 19 infection and partial SBO.  He was seen by GI and surgery.  He had endoscopic insertion of NG tube.  He had persistent fever and increasing O2 needs, and started on remdesivir and steroids for COVID 19 pneumonia.  Transferred to ICU.  Started on baricitinib.  PCCM asked to assist with respiratory management.  Past Medical History:  Ulcerative colitis s/p colectomy, Gout, CAD s/p DES, HTN, HLD, Nephrolithiasis, DM, Erythrocytosis  Significant Hospital Events:  1/15 Admit 1/18 Endoscopic insertion of NG tube 1/20 start remdesivir, steroids 1/24 needing high flow oxygen and NRB 2/01 start TPN  Consults:  Gastroenterology s/o 1/28 Surgery s/o 1/19  Procedures:  Rt PICC 2/01 >>   Significant Diagnostic Tests:   CT abd/pelvis 1/16 >> lower lungs clear, mildly dilated air and fluid-filled loops of ileum measuring up to 3.2 cm. No clear transition point or focal wall narrowing  CT angio chest 1/28 >> diffuse b/l airspace opacity consistent with COVID 19 pneumonia  Micro Data:  COVID PCR 1/15 >> Positive Flu PCR 1/15 >> negative  Antimicrobials:  Remdesivir 1/20 >> 1/24 Steroids 1/20 >>  Baricitinib 1/27 >>   Interim History / Subjective:  Walked some in his room.  Using between 30 to 35 liters oxygen.  Objective   Blood pressure 121/72, pulse (!) 105, temperature (!) 97.3 F (36.3 C), temperature source Axillary, resp. rate 16, height 6\' 6"  (1.981 m), weight 89.3 kg, SpO2 96 %.    Vent Mode: BIPAP FiO2 (%):  [45 %-100 %] 100 % Set Rate:  [26 bmp] 26 bmp PEEP:  [6 cmH20] 6 cmH20   Intake/Output Summary (Last  24 hours) at 03/23/2020 1535 Last data filed at 03/23/2020 0349 Gross per 24 hour  Intake 493.33 ml  Output 1100 ml  Net -606.67 ml   Filed Weights   03/21/20 0500 03/22/20 0415 03/23/20 0335  Weight: 93 kg 90.2 kg 89.3 kg    Examination:  General - alert, sitting in chair Eyes - pupils reactive ENT - no sinus tenderness, no stridor Cardiac - regular rate/rhythm, no murmur Chest - basilar rales Abdomen - soft, non tender, + bowel sounds Extremities - no cyanosis, clubbing, or edema Skin - no rashes Neuro - normal strength, moves extremities, follows commands Psych - normal mood and behavior   Resolved Hospital Problem list     Assessment & Plan:   Acute hypoxic respiratory failure from COVID 19 pneumonia. - completed course of remdesivir - day 14 of steroids - day 7 of baricitinib - goal SpO2 85 to 95% - f/u CXR later this week - bronchial hygiene, mobilize as able - monitor for signs of bacterial superinfection and thromboembolic disease - monitor need for intubation and mechanical ventilation; not needed at present  SBO/ileus. Ulcerative colitis. CAD s/p DES, HTN, HLD. DM type 2 poorly controlled with steroid induced hyperglycemia. Hyponatremia. - per primary team  PCCM will follow up again on 03/25/20 >> call if help needed sooner   Best practice (evaluated daily)  Diet: regular diet/TPN DVT prophylaxis: lovenox GI prophylaxis: protonix Mobility: OOB to chair Disposition: ICU Family: updated  pt's wife at bedside Code Status: ICU  Labs    CMP Latest Ref Rng & Units 03/23/2020 03/22/2020 03/22/2020  Glucose 70 - 99 mg/dL 183(H) - 165(H)  BUN 8 - 23 mg/dL 15 - 14  Creatinine 0.61 - 1.24 mg/dL 0.36(L) - 0.50(L)  Sodium 135 - 145 mmol/L 128(L) - 125(L)  Potassium 3.5 - 5.1 mmol/L 4.4 4.4 5.8(H)  Chloride 98 - 111 mmol/L 92(L) - 95(L)  CO2 22 - 32 mmol/L 28 - 23  Calcium 8.9 - 10.3 mg/dL 8.0(L) - 7.7(L)  Total Protein 6.5 - 8.1 g/dL 5.6(L) - 5.4(L)  Total  Bilirubin 0.3 - 1.2 mg/dL 0.9 - 2.3(H)  Alkaline Phos 38 - 126 U/L 53 - 59  AST 15 - 41 U/L 17 - 48(H)  ALT 0 - 44 U/L 14 - 13    CBC Latest Ref Rng & Units 03/23/2020 03/22/2020 03/21/2020  WBC 4.0 - 10.5 K/uL 15.6(H) 16.0(H) 23.0(H)  Hemoglobin 13.0 - 17.0 g/dL 14.6 14.9 15.2  Hematocrit 39.0 - 52.0 % 41.3 41.6 43.2  Platelets 150 - 400 K/uL 270 218 224    ABG    Component Value Date/Time   PHART 7.472 (H) 03/20/2020 0300   PCO2ART 36.3 03/20/2020 0300   PO2ART 75.6 (L) 03/20/2020 0300   HCO3 27.2 03/20/2020 0300   O2SAT 94.9 03/20/2020 0300    CBG (last 3)  Recent Labs    03/22/20 2351 03/23/20 0359 03/23/20 1140  GLUCAP 165* 170* 252*    Signature:  Chesley Mires, MD [place] Pager - [phone] 03/23/2020, 3:35 PM

## 2020-03-24 ENCOUNTER — Inpatient Hospital Stay (HOSPITAL_COMMUNITY): Payer: Commercial Managed Care - PPO

## 2020-03-24 DIAGNOSIS — U071 COVID-19: Secondary | ICD-10-CM | POA: Diagnosis not present

## 2020-03-24 DIAGNOSIS — I472 Ventricular tachycardia: Secondary | ICD-10-CM

## 2020-03-24 DIAGNOSIS — I2510 Atherosclerotic heart disease of native coronary artery without angina pectoris: Secondary | ICD-10-CM | POA: Diagnosis not present

## 2020-03-24 DIAGNOSIS — J9601 Acute respiratory failure with hypoxia: Secondary | ICD-10-CM | POA: Diagnosis not present

## 2020-03-24 DIAGNOSIS — K56600 Partial intestinal obstruction, unspecified as to cause: Secondary | ICD-10-CM | POA: Diagnosis not present

## 2020-03-24 LAB — CBC WITH DIFFERENTIAL/PLATELET
Abs Immature Granulocytes: 0.11 10*3/uL — ABNORMAL HIGH (ref 0.00–0.07)
Basophils Absolute: 0 10*3/uL (ref 0.0–0.1)
Basophils Relative: 0 %
Eosinophils Absolute: 0 10*3/uL (ref 0.0–0.5)
Eosinophils Relative: 0 %
HCT: 41.8 % (ref 39.0–52.0)
Hemoglobin: 15 g/dL (ref 13.0–17.0)
Immature Granulocytes: 1 %
Lymphocytes Relative: 3 %
Lymphs Abs: 0.4 10*3/uL — ABNORMAL LOW (ref 0.7–4.0)
MCH: 31.6 pg (ref 26.0–34.0)
MCHC: 35.9 g/dL (ref 30.0–36.0)
MCV: 88.2 fL (ref 80.0–100.0)
Monocytes Absolute: 0.2 10*3/uL (ref 0.1–1.0)
Monocytes Relative: 1 %
Neutro Abs: 14 10*3/uL — ABNORMAL HIGH (ref 1.7–7.7)
Neutrophils Relative %: 95 %
Platelets: 263 10*3/uL (ref 150–400)
RBC: 4.74 MIL/uL (ref 4.22–5.81)
RDW: 11.3 % — ABNORMAL LOW (ref 11.5–15.5)
WBC: 14.8 10*3/uL — ABNORMAL HIGH (ref 4.0–10.5)
nRBC: 0 % (ref 0.0–0.2)

## 2020-03-24 LAB — COMPREHENSIVE METABOLIC PANEL
ALT: 18 U/L (ref 0–44)
AST: 18 U/L (ref 15–41)
Albumin: 2.3 g/dL — ABNORMAL LOW (ref 3.5–5.0)
Alkaline Phosphatase: 49 U/L (ref 38–126)
Anion gap: 7 (ref 5–15)
BUN: 15 mg/dL (ref 8–23)
CO2: 26 mmol/L (ref 22–32)
Calcium: 8 mg/dL — ABNORMAL LOW (ref 8.9–10.3)
Chloride: 93 mmol/L — ABNORMAL LOW (ref 98–111)
Creatinine, Ser: 0.37 mg/dL — ABNORMAL LOW (ref 0.61–1.24)
GFR, Estimated: >60 mL/min (ref >60)
Glucose, Bld: 289 mg/dL — ABNORMAL HIGH (ref 70–99)
Potassium: 4.6 mmol/L (ref 3.5–5.1)
Sodium: 126 mmol/L — ABNORMAL LOW (ref 135–145)
Total Bilirubin: 0.9 mg/dL (ref 0.3–1.2)
Total Protein: 5.6 g/dL — ABNORMAL LOW (ref 6.5–8.1)

## 2020-03-24 LAB — GLUCOSE, CAPILLARY
Glucose-Capillary: 246 mg/dL — ABNORMAL HIGH (ref 70–99)
Glucose-Capillary: 306 mg/dL — ABNORMAL HIGH (ref 70–99)
Glucose-Capillary: 234 mg/dL — ABNORMAL HIGH (ref 70–99)
Glucose-Capillary: 255 mg/dL — ABNORMAL HIGH (ref 70–99)
Glucose-Capillary: 316 mg/dL — ABNORMAL HIGH (ref 70–99)

## 2020-03-24 LAB — ECHOCARDIOGRAM COMPLETE
Area-P 1/2: 3.06 cm2
Height: 78 in
S' Lateral: 2.19 cm
Weight: 3149.93 oz

## 2020-03-24 LAB — MAGNESIUM: Magnesium: 1.9 mg/dL (ref 1.7–2.4)

## 2020-03-24 LAB — D-DIMER, QUANTITATIVE: D-Dimer, Quant: 2.54 ug/mL-FEU — ABNORMAL HIGH (ref 0.00–0.50)

## 2020-03-24 LAB — C-REACTIVE PROTEIN: CRP: 1.1 mg/dL — ABNORMAL HIGH (ref <1.0)

## 2020-03-24 LAB — PHOSPHORUS: Phosphorus: 2.5 mg/dL (ref 2.5–4.6)

## 2020-03-24 LAB — FERRITIN: Ferritin: 1503 ng/mL — ABNORMAL HIGH (ref 24–336)

## 2020-03-24 MED ORDER — INSULIN ASPART 100 UNIT/ML ~~LOC~~ SOLN
0.0000 [IU] | SUBCUTANEOUS | Status: DC
  Administered 2020-03-24: 15 [IU] via SUBCUTANEOUS
  Administered 2020-03-24 (×2): 7 [IU] via SUBCUTANEOUS
  Administered 2020-03-25 (×2): 11 [IU] via SUBCUTANEOUS
  Administered 2020-03-25: 7 [IU] via SUBCUTANEOUS
  Administered 2020-03-25: 11 [IU] via SUBCUTANEOUS
  Administered 2020-03-25: 20 [IU] via SUBCUTANEOUS
  Administered 2020-03-25 – 2020-03-27 (×7): 11 [IU] via SUBCUTANEOUS
  Administered 2020-03-27: 4 [IU] via SUBCUTANEOUS
  Administered 2020-03-27: 11 [IU] via SUBCUTANEOUS
  Administered 2020-03-27: 7 [IU] via SUBCUTANEOUS
  Administered 2020-03-27: 15 [IU] via SUBCUTANEOUS
  Administered 2020-03-28: 7 [IU] via SUBCUTANEOUS
  Administered 2020-03-28: 4 [IU] via SUBCUTANEOUS
  Administered 2020-03-28 (×2): 7 [IU] via SUBCUTANEOUS
  Administered 2020-03-28: 3 [IU] via SUBCUTANEOUS
  Administered 2020-03-28: 4 [IU] via SUBCUTANEOUS
  Administered 2020-03-29 (×2): 7 [IU] via SUBCUTANEOUS
  Administered 2020-03-29 (×3): 11 [IU] via SUBCUTANEOUS
  Administered 2020-03-29 – 2020-03-30 (×4): 7 [IU] via SUBCUTANEOUS
  Administered 2020-03-30: 15 [IU] via SUBCUTANEOUS
  Administered 2020-03-30: 4 [IU] via SUBCUTANEOUS
  Administered 2020-03-30 – 2020-03-31 (×3): 7 [IU] via SUBCUTANEOUS
  Administered 2020-03-31: 3 [IU] via SUBCUTANEOUS
  Administered 2020-03-31 (×2): 7 [IU] via SUBCUTANEOUS
  Administered 2020-03-31 – 2020-04-01 (×5): 4 [IU] via SUBCUTANEOUS
  Administered 2020-04-01 – 2020-04-02 (×3): 7 [IU] via SUBCUTANEOUS
  Administered 2020-04-02: 15 [IU] via SUBCUTANEOUS
  Administered 2020-04-02 (×3): 7 [IU] via SUBCUTANEOUS
  Administered 2020-04-03: 11 [IU] via SUBCUTANEOUS
  Administered 2020-04-03 (×2): 7 [IU] via SUBCUTANEOUS
  Administered 2020-04-03: 4 [IU] via SUBCUTANEOUS
  Administered 2020-04-03: 7 [IU] via SUBCUTANEOUS
  Administered 2020-04-03: 4 [IU] via SUBCUTANEOUS
  Administered 2020-04-04: 15 [IU] via SUBCUTANEOUS
  Administered 2020-04-04: 11 [IU] via SUBCUTANEOUS
  Administered 2020-04-04: 15 [IU] via SUBCUTANEOUS
  Administered 2020-04-04: 7 [IU] via SUBCUTANEOUS
  Administered 2020-04-04: 20 [IU] via SUBCUTANEOUS
  Administered 2020-04-04: 7 [IU] via SUBCUTANEOUS
  Administered 2020-04-05: 4 [IU] via SUBCUTANEOUS
  Administered 2020-04-05: 7 [IU] via SUBCUTANEOUS
  Administered 2020-04-05 (×2): 3 [IU] via SUBCUTANEOUS
  Administered 2020-04-08 (×2): 4 [IU] via SUBCUTANEOUS
  Administered 2020-04-08 – 2020-04-09 (×5): 7 [IU] via SUBCUTANEOUS
  Administered 2020-04-09 – 2020-04-10 (×4): 11 [IU] via SUBCUTANEOUS
  Administered 2020-04-10: 4 [IU] via SUBCUTANEOUS
  Administered 2020-04-10 (×3): 11 [IU] via SUBCUTANEOUS
  Administered 2020-04-10: 7 [IU] via SUBCUTANEOUS
  Administered 2020-04-11: 15 [IU] via SUBCUTANEOUS
  Administered 2020-04-11: 11 [IU] via SUBCUTANEOUS
  Administered 2020-04-11: 4 [IU] via SUBCUTANEOUS
  Administered 2020-04-11: 3 [IU] via SUBCUTANEOUS
  Administered 2020-04-11 (×2): 4 [IU] via SUBCUTANEOUS
  Administered 2020-04-11: 7 [IU] via SUBCUTANEOUS
  Administered 2020-04-12: 3 [IU] via SUBCUTANEOUS
  Administered 2020-04-12 (×2): 7 [IU] via SUBCUTANEOUS
  Administered 2020-04-12: 11 [IU] via SUBCUTANEOUS
  Administered 2020-04-12: 7 [IU] via SUBCUTANEOUS
  Administered 2020-04-12: 4 [IU] via SUBCUTANEOUS
  Administered 2020-04-13 (×2): 7 [IU] via SUBCUTANEOUS
  Administered 2020-04-13 (×2): 11 [IU] via SUBCUTANEOUS
  Administered 2020-04-13: 7 [IU] via SUBCUTANEOUS
  Administered 2020-04-14: 4 [IU] via SUBCUTANEOUS
  Administered 2020-04-14: 6 [IU] via SUBCUTANEOUS
  Administered 2020-04-14 (×4): 7 [IU] via SUBCUTANEOUS
  Administered 2020-04-15: 11 [IU] via SUBCUTANEOUS

## 2020-03-24 MED ORDER — TRAVASOL 10 % IV SOLN
INTRAVENOUS | Status: AC
  Filled 2020-03-24: qty 1440

## 2020-03-24 MED ORDER — INSULIN GLARGINE 100 UNIT/ML ~~LOC~~ SOLN
12.0000 [IU] | Freq: Two times a day (BID) | SUBCUTANEOUS | Status: DC
  Administered 2020-03-24 – 2020-03-25 (×3): 12 [IU] via SUBCUTANEOUS
  Filled 2020-03-24 (×9): qty 0.12

## 2020-03-24 NOTE — Progress Notes (Signed)
*  PRELIMINARY RESULTS* Echocardiogram 2D Echocardiogram has been performed.  Calvin Cruz 03/24/2020, 8:37 AM

## 2020-03-24 NOTE — Progress Notes (Addendum)
PHARMACY - TOTAL PARENTERAL NUTRITION CONSULT NOTE   Indication: Small bowel obstruction/ Unable to feed by NGT .   Patient Measurements: Height: 6\' 6"  (198.1 cm) Weight: 89.3 kg (196 lb 13.9 oz) IBW/kg (Calculated) : 91.4 TPN AdjBW (KG): 108.9 Body mass index is 22.75 kg/m. Usual Weight: 108.9 kg on admission, 90kg now  Assessment:  Patient is a 60 yo male with history of Ulcerative colitis s/p ileal anastomosis /colectomy, gout, CAD, DM2 and HTN. He presents with small bowel obstruction and COVID-19 pneumonia and gastroenteritis/infection.  SBO improved and on regular diet but unable to tolerate. Abdomen still distended.  Patient difficult NGT placement due to previous nasal surgeries. Concern with possible refeeding syndrome. Start at lower rate today to ensure tolerates.   Glucose / Insulin: 168-326/37 units insulin Electrolytes: Na 126- potassium WNL now Renal: stable LFTs / TGs: TG 188 Prealbumin / albumin: Alb 2.2 Intake / Output; MIVF: 1800 IV/ 1168mls out GI Imaging: SBO resolved, but unable to eat still since 1/26 Surgeries / Procedures: none this admission  Central access: 03/22/2020 TPN start date: 03/22/2020  Nutritional Goals (per RD recommendation on 1/31): kCal: 5597-4163, Protein: 140-167g Fluid: per MD 2439mls/day  Goal TPN rate is 100 mL/hr (provides 144 g of protein 1963 kcals per day, can increase when add lipids)   Current Nutrition:  Low residue diet, but not tolerating   Plan:   Increase TPN to 100 mL/hr at 1800 Holding lipids at this time due to shortage- F/U initiation if needed. Electrolytes in TPN: 39mEq/L of Na, 40 mEq/L of K, 30mEq/L of Ca, 18mEq/L of Mg, and 65mmol/L of Phos. Cl:Ac ratio 1:1 Add standard MVI and trace elements to TPN Add 20 units insulin to TPN Adjust to resistant q4h SSI and adjust as needed  DC IV fluids at 1800 Monitor TPN labs on Mon/Thurs  Margot Ables, PharmD Clinical Pharmacist 03/24/2020 10:30 AM

## 2020-03-24 NOTE — Progress Notes (Signed)
Nutrition Follow-up  DOCUMENTATION CODES:      INTERVENTION:  Continue to encourage po intake  TPN management per pharmacy -progressing towards goal   NUTRITION DIAGNOSIS:   Increased nutrient needs related to catabolic illness (BLTJQ-30) as evidenced by estimated needs,energy intake < or equal to 50% for > or equal to 5 days. -ongoing  GOAL:   Provide needs based on ASPEN/SCCM guidelines -met  MONITOR:   PO intake,Skin,I & O's,Labs  REASON FOR ASSESSMENT:   Consult Assessment of nutrition requirement/status  ASSESSMENT:   Patient is a 61 yo male with history of Ulcerative colitis s/p ileal anastomosis /colectomy, gout, CAD, DM2 and HTN. He presents with small bowel obstruction and COVID-19 pneumonia and gastroenteritis/infection.  1/15-admit  Poor oral intake s/p resolved SBO, unable to place NG tube,  PICC placed and TPN started on 2/2.  Pt with ongoing high oxygen requirements, on 40 L HFNC +NRB  Meal completion - 25% on DYS 3  TPN to increase to goal rate 100 ml/hr at 1800. This will provide 1963 kcal and 144 grams of protein, meeting 81% of estimated calories and 100% of protein needs.   Weight 89.3 kg on 2/02 down 10 kg from 99.3 kg on 1/23; significant (10%)  I/Os: +9542.4 ml since admit UOP: 1900 ml x 24 hrs  Medications reviewed and include: Vit C, D3, Lovenox, SSI, Methylprednisolone, Lantus 12 units twice daily, Zinc sulfate, Protonix, Zofran  Labs: CBGs 234,306, Na 126 (L), WBC 14.8 (H) K/Mg/P - WNL   Diet Order:   Diet Order            DIET DYS 3 Room service appropriate? Yes; Fluid consistency: Thin  Diet effective now                 EDUCATION NEEDS:   Not appropriate for education at this time  Skin:  Skin Assessment: Reviewed RN Assessment  Last BM:  2/1-type 7  Height:   Ht Readings from Last 1 Encounters:  03/20/20 '6\' 6"'  (1.981 m)    Weight:   Wt Readings from Last 1 Encounters:  03/23/20 89.3 kg   BMI:  Body  mass index is 22.75 kg/m.  Estimated Nutritional Needs:   Kcal:  0923-3007  Protein:  140-167 gr protein  Fluid:  per MD goals   Lajuan Lines, RD, LDN Clinical Nutrition After Hours/Weekend Pager # in [place]

## 2020-03-24 NOTE — Progress Notes (Signed)
Patient c/o of non-specific generalized pain, requesting pain medication at this time. Pain scale of 8/10 to "shoulders and Left hip" per patient.

## 2020-03-24 NOTE — Progress Notes (Signed)
PROGRESS NOTE    Calvin Cruz  WUJ:[id number] DOB: 05-07-1960 DOA: 03-21-2020 PCP: Reynold Bowen, MD   Brief Narrative:  60 year old male with a history ofsecondarypolycythemia(JAK2 neg), hyperlipidemia, diabetes mellitus type 2, hypertension, coronary artery disease status post NSTEMI with DES to the circumflex June 2016, ulcerative colitis status post proctocolectomy with ileoanal anastomosis, and recurrent episodes of pouchitis presenting with upper abdominal pain, nausea, vomiting, and loose stools that began on 03/04/2020. He states that he normally has 5-6 loose bowel movements on average day secondary to his ulcerative colitis. He normally uses Imodium, usually 1 to 2 tablets on a daily basis. His last bowel movement was on 03-21-2020. He denies any fevers, chills, chest pain, shortness of breath, cough, hemoptysis. He denies any headache, sore throat, hemoptysis. He has had numerous episodes of emesis on March 21, 2020 resulting in dry heaving. There is no hematemesis. He denies hematochezia or melena.He has not had a bowel movement since the afternoon of 03/12/2020. In the emergency department, the patient was afebrile hemodynamically stable with oxygen saturation 94-96% on room air. BMP showed a sodium 135, potassium 4.1, BUN 14, serum creatinine 0.81. LFTs were unremarkable. Lipase 21. WBC 7.1, hemoglobin 17.7, platelets 174,000. CT of the abdomen and pelvis showed mildly dilated air-fluid filled loops of ileum up to 3.2 cm. There was no clear transition point or focal narrowing. There is no surrounding inflammatory changes. He is status post colectomy with ileal colonic anastomosis.  -Patient is being treated for acute hypoxemic respiratory failure in the setting of COVID-19 pneumonia and continues to struggle with high oxygen requirements.  Pulmonology assisting with management.  Poor nutritional status noted after recent SBO/ileus and now with poor oral intake.  Plan for  PICC line placement and TPN to initiate after dietitian evaluation performed on 1/31.  Assessment & Plan:   Principal Problem:   Partial small bowel obstruction (HCC) Active Problems:   Hyperlipidemia with target LDL less than 70   Essential hypertension   CAD S/P DES PCI-circumflex   Abdominal pain   COVID-19 virus infection   Hyperglycemia due to diabetes mellitus (HCC)   Nausea and vomiting   Gastroenteritis due to COVID-19 virus   Abdominal distention   Hypoxemia   SOB (shortness of breath)   Acute respiratory failure with hypoxia due toCOVID-19pneumonia;patient also with COVID-19gastroenteritis/Infection -Continue steroids and baricitinib (last one day 7/14) -Completed remdesivir -Procalcitonin lowand repeat chest x-ray stable on 1/30, BNP low as well. Continue holding on use of diuretics or antibiotics currently. -Elevated D-dimers noted on 1/28 for which CT angiogram for PE study was ordered, neg results. -Plan to allow wife to visit to increase mobilization and compliance with resp activities. -Appreciate Pulmonology recommendations -continue to Wean O2 as tolerated  Partial small bowel obstruction-resolved -Patient currently onregular diet and tolerating -continue judicious use of narcotics; goal is not for pain free; but to assist tolerating symptoms.  -Continue the use of levsin as needed . -Continue PRN antiemetics.  -Will continue to follow recommendations by GI serviceprn -Patient also with improvement in his distention, having bowel sounds and had experienced multiple bowel movements overnight. No nausea or vomiting currently. -Patient has been instructed to increase physical activity. -Plan to slowly wean fentanyl use -Dietician consultation appreciated 1/31 -S/P PICC line placement and TPN initiated on 03/22/20 -low residue dysphagia 3 diet ordered.  Delirium/agitation -Haldol ordered as needed -Allowing wife to visit to further assist with  behavior. -continue constant reorientation and engagement into daily activities. -UA with pyuria; culture pending. -  patient denies dysuria, no fever and WBC's trending down in the setting of steroids usage.Marland Kitchen  Hyponatremia-likely related to SIADH and poor solute intake. -TSH within normal limits and serum osmolarity 272 -gentle fluid restriction will be pursuit. -follow electrolytes trend  -sodium corrected 129-131 -Should improve further with TPN, continue to monitor  Coronary artery disease with history of NSTEMI -No chest pain presently -Restart Brilinta when able to fully tolerate p.o. -Patient follows with Dr. Glenetta Hew -Beta-blocker has been resumed and dose adjusted as per home regimen..  Nonsustained ventricular tachycardia -As mentioned above beta-blocker dose has been adjusted -Continue to follow electrolytes and replete as needed. -rate much better controlled today -2- D echo demonstrated preserved ejection fraction, no regional wall motion normalities, positive left ventricle hypertrophy; indeterminate parameters for diastolic dysfunction.  No significant valvular disorder.  Diabetes mellitus type 2, controlled -Restart Jardianceas an outpatient -Continue sliding scale insulin while inpatient. -03/07/20 A1C--6.7 -Treatment for COVID infection using steroids contributing to elevated CBGs while inpatient. -Continue Lantus and further adjust based on CBGs fluctuation.  Hyperlipidemia -Restart Crestor and Vascepa once oral intake adequate.  Pituitary Tumor  -Found in 08/28/16 Brain MRI  -Will continue outpatient follow up with Dr. Forde Dandy.   Status is: Inpatient   Dispo: The patient is from:Home Anticipated d/c is to:SNF Anticipated d/c date is:To be determined Patient currently is not medically stable to d/c.  Still requiring significant amount of oxygen supplementation.  Physical therapy recommending home health  services.   Family Communication:spouse updated over the phone on 03/24/2020  Consultants:GI, general surgery, Pulmonology  Code Status: FULL  DVT Prophylaxis: Lovenox   Procedures: As Listed in Progress Note Above  Antimicrobials:  Anti-infectives (From admission, onward)   Start     Dose/Rate Route Frequency Ordered Stop   03/11/20 1000  remdesivir 100 mg in sodium chloride 0.9 % 100 mL IVPB        100 mg 200 mL/hr over 30 Minutes Intravenous Daily 03/10/20 1105 03/14/20 0900   03/10/20 1200  remdesivir 100 mg in sodium chloride 0.9 % 100 mL IVPB        100 mg 200 mL/hr over 30 Minutes Intravenous Every 1 hr x 2 03/10/20 1105 03/10/20 1320      Subjective: No need for BiPAP overnight; still requiring 30-40 L nasal cannula supplementation and FiO2 100%.  Denies chest pain, no nausea vomiting.  Patient expressed feeling better today.  Participating with physical therapy on 03/23/2020, during my evaluation expressed plans to get involved in the use of IS and flutter valve.  Objective: Vitals:   03/24/20 1100 03/24/20 1200 03/24/20 1300 03/24/20 1500  BP: 134/76 (!) 144/85    Pulse: 94 91    Resp: (!) 31 (!) 22    Temp:   (!) 97.3 F (36.3 C) 97.6 F (36.4 C)  TempSrc:   Axillary Axillary  SpO2: 95% 94% (!) 87%   Weight:      Height:        Intake/Output Summary (Last 24 hours) at 03/24/2020 1751 Last data filed at 03/24/2020 1000 Gross per 24 hour  Intake 1081.67 ml  Output 1400 ml  Net -318.33 ml   Filed Weights   03/21/20 0500 03/22/20 0415 03/23/20 0335  Weight: 93 kg 90.2 kg 89.3 kg    Examination: General exam: Alert, awake, oriented x 3; reports feeling slightly better today. No nausea, no vomiting. Weak and tired, along with Short winded sensation with activity. Overnight didn't use BIPAP. Still requiring 30-40L  oxygen supplementation FIO2 100%. No fever. Following commands appropriately, in good spirit.  Respiratory system: Positive tachypnea  appreciated especially with activity; no wheezing, positive rhonchi bilaterally.  No using accessory muscles during examination. Cardiovascular system: Rate controlled, sinus rhythm; no rubs, no gallops, no JVD Gastrointestinal system: Abdomen is nondistended, soft and nontender. No organomegaly or masses felt. Normal bowel sounds heard.  No guarding appreciated.. Central nervous system: Alert and oriented. No focal neurological deficits. Extremities: No cyanosis, no clubbing, no edema. PICC line and TPN running in place. Skin: No petechiae. Psychiatry: Judgement and insight appear normal. Flat affect, no SI or hallucinations.   Data Reviewed: I have personally reviewed following labs and imaging studies  CBC: Recent Labs  Lab 03/20/20 0500 03/21/20 0600 03/22/20 0814 03/23/20 0457 03/24/20 0754  WBC 26.6* 23.0* 16.0* 15.6* 14.8*  NEUTROABS  --   --  15.2* 14.8* 14.0*  HGB 16.1 15.2 14.9 14.6 15.0  HCT 44.8 43.2 41.6 41.3 41.8  MCV 88.0 88.9 88.9 89.0 88.2  PLT 257 224 218 270 [id number]   Basic Metabolic Panel: Recent Labs  Lab 03/18/20 0523 03/19/20 0631 03/20/20 0500 03/21/20 0600 03/22/20 0537 03/22/20 1255 03/23/20 0457 03/24/20 0754  NA 126*   < > 126* 128* 125*  --  128* 126*  K 4.3   < > 4.0 4.3 5.8* 4.4 4.4 4.6  CL 90*   < > 91* 94* 95*  --  92* 93*  CO2 23   < > 25 25 23   --  28 26  GLUCOSE 184*   < > 172* 185* 165*  --  183* 289*  BUN 16   < > 14 18 14   --  15 15  CREATININE 0.58*   < > 0.54* 0.50* 0.50*  --  0.36* 0.37*  CALCIUM 8.1*   < > 8.2* 8.1* 7.7*  --  8.0* 8.0*  MG 1.9  --   --   --  2.1  --  2.1 1.9  PHOS  --   --   --   --   --   --  2.9 2.5   < > = values in this interval not displayed.   GFR: Estimated Creatinine Clearance: 122.5 mL/min (A) (by C-G formula based on SCr of 0.37 mg/dL (L)).   Liver Function Tests: Recent Labs  Lab 03/20/20 0500 03/21/20 0600 03/22/20 0537 03/23/20 0457 03/24/20 0754  AST 19 22 48* 17 18  ALT 18 16 13 14 18    ALKPHOS 61 54 59 53 49  BILITOT 1.5* 1.4* 2.3* 0.9 0.9  PROT 6.0* 5.5* 5.4* 5.6* 5.6*  ALBUMIN 2.5* 2.2* 2.2* 2.2* 2.3*   CBG: Recent Labs  Lab 03/23/20 2350 03/24/20 0413 03/24/20 0748 03/24/20 1248 03/24/20 1609  GLUCAP 211* 246* 306* 234* 255*   Lipid Profile: Recent Labs    03/23/20 0457  TRIG 188*   Anemia Panel: Recent Labs    03/23/20 0457 03/24/20 0607  FERRITIN 2,007* 1,503*   Sepsis Labs: Recent Labs  Lab 03/20/20 0700  PROCALCITON 0.16    No results found for this or any previous visit (from the past 240 hour(s)).    Radiology Studies: ECHOCARDIOGRAM COMPLETE  Result Date: 03/24/2020    ECHOCARDIOGRAM REPORT   Patient Name:   Calvin Cruz Date of Exam: 03/24/2020 Medical Rec #:  [id number]       Height:       78.0 in Accession #:    [id number]  Weight:       196.9 lb Date of Birth:  11/08/1960      BSA:          2.242 m Patient Age:    11 years        BP:           130/68 mmHg Patient Gender: M               HR:           95 bpm. Exam Location:  [facility] Procedure: 2D Echo Indications:    Ventricular Tachycardia I47.2  History:        Patient has prior history of Echocardiogram examinations, most                 recent 07/22/2014. CAD and Previous Myocardial Infarction; Risk                 Factors:Dyslipidemia, Hypertension and Former Smoker. Covid 19.  Sonographer:    Leavy Cella RDCS (AE) Referring Phys: [place]  1. Left ventricular ejection fraction, by estimation, is 55 to 60%. The left ventricle has normal function. The left ventricle has no regional wall motion abnormalities. There is moderate left ventricular hypertrophy. Left ventricular diastolic parameters are indeterminate.  2. Right ventricular systolic function is low normal. The right ventricular size is normal. Tricuspid regurgitation signal is inadequate for assessing PA pressure.  3. The mitral valve is grossly normal. Trivial mitral valve regurgitation.  4. The  aortic valve is tricuspid. There is mild calcification of the aortic valve. Aortic valve regurgitation is not visualized.  5. The inferior vena cava is normal in size with greater than 50% respiratory variability, suggesting right atrial pressure of 3 mmHg. FINDINGS  Left Ventricle: Left ventricular ejection fraction, by estimation, is 55 to 60%. The left ventricle has normal function. The left ventricle has no regional wall motion abnormalities. The left ventricular internal cavity size was normal in size. There is  moderate left ventricular hypertrophy. Left ventricular diastolic parameters are indeterminate. Right Ventricle: The right ventricular size is normal. No increase in right ventricular wall thickness. Right ventricular systolic function is low normal. Tricuspid regurgitation signal is inadequate for assessing PA pressure. Left Atrium: Left atrial size was normal in size. Right Atrium: Right atrial size was normal in size. Pericardium: There is no evidence of pericardial effusion. Mitral Valve: The mitral valve is grossly normal. There is mild thickening of the mitral valve leaflet(s). Mild mitral annular calcification. Trivial mitral valve regurgitation. Tricuspid Valve: The tricuspid valve is grossly normal. Tricuspid valve regurgitation is trivial. Aortic Valve: The aortic valve is tricuspid. There is mild calcification of the aortic valve. There is mild to moderate aortic valve annular calcification. Aortic valve regurgitation is not visualized. Pulmonic Valve: The pulmonic valve was grossly normal. Pulmonic valve regurgitation is trivial. Aorta: The aortic root is normal in size and structure. Venous: The inferior vena cava is normal in size with greater than 50% respiratory variability, suggesting right atrial pressure of 3 mmHg. IAS/Shunts: No atrial level shunt detected by color flow Doppler.  LEFT VENTRICLE PLAX 2D LVIDd:         3.23 cm  Diastology LVIDs:         2.19 cm  LV e' medial:    5.22  cm/s LV PW:         1.89 cm  LV E/e' medial:  13.9 LV IVS:  1.20 cm  LV e' lateral:   11.90 cm/s LVOT diam:     1.90 cm  LV E/e' lateral: 6.1 LVOT Area:     2.84 cm  RIGHT VENTRICLE RV S prime:     21.30 cm/s TAPSE (M-mode): 2.1 cm LEFT ATRIUM           Index      RIGHT ATRIUM          Index LA diam:      3.30 cm 1.47 cm/m RA Area:     8.48 cm LA Vol (A2C): 17.1 ml 7.63 ml/m RA Volume:   16.90 ml 7.54 ml/m LA Vol (A4C): 21.3 ml 9.50 ml/m   AORTA Ao Root diam: 2.90 cm MITRAL VALVE MV Area (PHT): 3.06 cm    SHUNTS MV Decel Time: 248 msec    Systemic Diam: 1.90 cm MV E velocity: 72.70 cm/s MV A velocity: 74.90 cm/s MV E/A ratio:  0.97 Rozann Lesches MD Electronically signed by Rozann Lesches MD Signature Date/Time: 03/24/2020/11:43:16 AM    Final     Scheduled Meds: . vitamin C  1,000 mg Oral Daily  . baricitinib  4 mg Oral Daily  . Chlorhexidine Gluconate Cloth  6 each Topical Daily  . cholecalciferol  5,000 Units Oral Daily  . diclofenac Sodium  4 g Topical QID  . enoxaparin (LOVENOX) injection  40 mg Subcutaneous Q12H  . insulin aspart  0-20 Units Subcutaneous Q4H  . insulin glargine  12 Units Subcutaneous BID  . Ipratropium-Albuterol  1 puff Inhalation TID  . lidocaine  1 patch Transdermal Q24H  . methylPREDNISolone (SOLU-MEDROL) injection  90 mg Intravenous Q12H  . metoprolol tartrate  5 mg Intravenous Q12H  . ondansetron (ZOFRAN) IV  4 mg Intravenous Q6H  . pantoprazole  40 mg Oral BID AC  . sodium chloride flush  10-40 mL Intracatheter Q12H  . zinc sulfate  220 mg Oral Daily   Continuous Infusions: . sodium chloride Stopped (03/24/20 1716)  . TPN ADULT (ION) 50 mL/hr at 03/23/20 1807  . TPN ADULT (ION)       LOS: 18 days    Time spent: 35 minutes    Barton Dubois, MD [facility]  If 7PM-7AM, please contact night-coverage [email or web address] 03/24/2020, 5:51 PM

## 2020-03-25 ENCOUNTER — Inpatient Hospital Stay (HOSPITAL_COMMUNITY): Payer: Commercial Managed Care - PPO

## 2020-03-25 DIAGNOSIS — U071 COVID-19: Secondary | ICD-10-CM | POA: Diagnosis not present

## 2020-03-25 DIAGNOSIS — K56600 Partial intestinal obstruction, unspecified as to cause: Secondary | ICD-10-CM | POA: Diagnosis not present

## 2020-03-25 DIAGNOSIS — I2510 Atherosclerotic heart disease of native coronary artery without angina pectoris: Secondary | ICD-10-CM | POA: Diagnosis not present

## 2020-03-25 DIAGNOSIS — J9601 Acute respiratory failure with hypoxia: Secondary | ICD-10-CM | POA: Diagnosis not present

## 2020-03-25 LAB — D-DIMER, QUANTITATIVE: D-Dimer, Quant: 2.9 ug/mL-FEU — ABNORMAL HIGH (ref 0.00–0.50)

## 2020-03-25 LAB — COMPREHENSIVE METABOLIC PANEL
ALT: 18 U/L (ref 0–44)
AST: 18 U/L (ref 15–41)
Albumin: 2.5 g/dL — ABNORMAL LOW (ref 3.5–5.0)
Alkaline Phosphatase: 51 U/L (ref 38–126)
Anion gap: 9 (ref 5–15)
BUN: 17 mg/dL (ref 8–23)
CO2: 28 mmol/L (ref 22–32)
Calcium: 8.5 mg/dL — ABNORMAL LOW (ref 8.9–10.3)
Chloride: 92 mmol/L — ABNORMAL LOW (ref 98–111)
Creatinine, Ser: 0.36 mg/dL — ABNORMAL LOW (ref 0.61–1.24)
GFR, Estimated: >60 mL/min (ref >60)
Glucose, Bld: 270 mg/dL — ABNORMAL HIGH (ref 70–99)
Potassium: 4.5 mmol/L (ref 3.5–5.1)
Sodium: 129 mmol/L — ABNORMAL LOW (ref 135–145)
Total Bilirubin: 0.9 mg/dL (ref 0.3–1.2)
Total Protein: 6 g/dL — ABNORMAL LOW (ref 6.5–8.1)

## 2020-03-25 LAB — CBC WITH DIFFERENTIAL/PLATELET
Abs Immature Granulocytes: 0.25 10*3/uL — ABNORMAL HIGH (ref 0.00–0.07)
Basophils Absolute: 0 10*3/uL (ref 0.0–0.1)
Basophils Relative: 0 %
Eosinophils Absolute: 0 10*3/uL (ref 0.0–0.5)
Eosinophils Relative: 0 %
HCT: 45.1 % (ref 39.0–52.0)
Hemoglobin: 16.1 g/dL (ref 13.0–17.0)
Immature Granulocytes: 1 %
Lymphocytes Relative: 3 %
Lymphs Abs: 0.6 10*3/uL — ABNORMAL LOW (ref 0.7–4.0)
MCH: 31.3 pg (ref 26.0–34.0)
MCHC: 35.7 g/dL (ref 30.0–36.0)
MCV: 87.7 fL (ref 80.0–100.0)
Monocytes Absolute: 0.4 10*3/uL (ref 0.1–1.0)
Monocytes Relative: 2 %
Neutro Abs: 20.2 10*3/uL — ABNORMAL HIGH (ref 1.7–7.7)
Neutrophils Relative %: 94 %
Platelets: 308 10*3/uL (ref 150–400)
RBC: 5.14 MIL/uL (ref 4.22–5.81)
RDW: 11.1 % — ABNORMAL LOW (ref 11.5–15.5)
WBC: 21.5 10*3/uL — ABNORMAL HIGH (ref 4.0–10.5)
nRBC: 0 % (ref 0.0–0.2)

## 2020-03-25 LAB — GLUCOSE, CAPILLARY
Glucose-Capillary: 239 mg/dL — ABNORMAL HIGH (ref 70–99)
Glucose-Capillary: 258 mg/dL — ABNORMAL HIGH (ref 70–99)
Glucose-Capillary: 259 mg/dL — ABNORMAL HIGH (ref 70–99)
Glucose-Capillary: 279 mg/dL — ABNORMAL HIGH (ref 70–99)
Glucose-Capillary: 294 mg/dL — ABNORMAL HIGH (ref 70–99)
Glucose-Capillary: 300 mg/dL — ABNORMAL HIGH (ref 70–99)
Glucose-Capillary: 369 mg/dL — ABNORMAL HIGH (ref 70–99)

## 2020-03-25 LAB — C-REACTIVE PROTEIN: CRP: 0.9 mg/dL (ref <1.0)

## 2020-03-25 LAB — MAGNESIUM: Magnesium: 2.1 mg/dL (ref 1.7–2.4)

## 2020-03-25 LAB — FERRITIN: Ferritin: 1595 ng/mL — ABNORMAL HIGH (ref 24–336)

## 2020-03-25 MED ORDER — TRAVASOL 10 % IV SOLN
INTRAVENOUS | Status: AC
  Filled 2020-03-25: qty 1440

## 2020-03-25 MED ORDER — SALINE SPRAY 0.65 % NA SOLN
1.0000 | NASAL | Status: DC | PRN
  Filled 2020-03-25: qty 44

## 2020-03-25 MED ORDER — FLUTICASONE PROPIONATE 50 MCG/ACT NA SUSP
1.0000 | Freq: Every day | NASAL | Status: DC
  Administered 2020-03-25 – 2020-03-28 (×4): 1 via NASAL
  Filled 2020-03-25: qty 16

## 2020-03-25 MED ORDER — TRAVASOL 10 % IV SOLN: INTRAVENOUS | Status: DC

## 2020-03-25 MED ORDER — INSULIN GLARGINE 100 UNIT/ML ~~LOC~~ SOLN
18.0000 [IU] | Freq: Two times a day (BID) | SUBCUTANEOUS | Status: DC
  Administered 2020-03-25 – 2020-03-29 (×8): 18 [IU] via SUBCUTANEOUS
  Filled 2020-03-25 (×12): qty 0.18

## 2020-03-25 MED ORDER — SALINE SPRAY 0.65 % NA SOLN
1.0000 | NASAL | Status: DC | PRN
  Administered 2020-03-25: 1 via NASAL
  Filled 2020-03-25: qty 44

## 2020-03-25 NOTE — Progress Notes (Signed)
Physical Therapy Treatment Patient Details Name: Calvin Cruz MRN: [id number] DOB: [birth year] Today's Date: 03/25/2020    History of Present Illness Calvin Cruz is a 60 y.o. male with medical history significant for hx of ulcerative colitis, s/p colectomy with Hartman's pouch formation,  gout, NSTEMI, CAD s/p DES June 2016, hypertension and hyperlipidemia who presents to the emergency department due to 1 day onset of lower abdominal pain with associated nausea and vomiting.  Pain was described as sharp, intermittent and rated as 7-8/10 on pain scale, he also complained of nonbloody but bilious like vomiting of several episodes since onset of symptoms.  No alleviating/aggravating factors known, symptoms are similar to prior small bowel obstruction (last SBO was about a year ago per patient).  Last bowel movement was yesterday (1/15) in the morning.  He denies fever, chills, chest pain, shortness of breath, diarrhea or any sick contact.  Patient states that he never had COVID-vaccine    PT Comments    Patient presents up in chair (assisted by RN) and agreeable for therapy.  Patient requires frequent rest breaks while completing BLE exercises due to SOB and O2 desaturation, had much difficulty completing sit to stands from chair due to BLE weakness and limited to a few steps at bedside before having to sit due to SOB.  Patient put back to bed after therapy requiring 7-8 minutes to increase SpO2 from 65% to 91% with instruction in pursed lipped breathing.  Patient left with spouse present in room.  Patient will benefit from continued physical therapy in hospital and recommended venue below to increase strength, balance, endurance for safe ADLs and gait.   Follow Up Recommendations  Supervision for mobility/OOB;Supervision - Intermittent;SNF     Equipment Recommendations  Rolling walker with 5" wheels    Recommendations for Other Services       Precautions / Restrictions  Precautions Precautions: Fall    Mobility  Bed Mobility Overal bed mobility: Needs Assistance Bed Mobility: Sit to Supine       Sit to supine: Min assist;Min guard   General bed mobility comments: labored movement requiring assistance to move BLE  Transfers Overall transfer level: Needs assistance Equipment used: Rolling walker (2 wheeled) Transfers: Sit to/from [facility] Sit to Stand: Mod assist Stand pivot transfers: Min assist       General transfer comment: had diffiuclty standing up from chair due to BLE weakness  Ambulation/Gait Ambulation/Gait assistance: Min assist;Mod assist Gait Distance (Feet): 6 Feet Assistive device: Rolling walker (2 wheeled) Gait Pattern/deviations: Decreased step length - right;Decreased step length - left;Decreased stride length Gait velocity: decreased   General Gait Details: limited to 5-6 steps before having to sit due to fatigue and SpO2 dropping from 90% to 65% while on 30% HFNC O2   Stairs             Wheelchair Mobility    Modified Rankin (Stroke Patients Only)       Balance Overall balance assessment: Needs assistance Sitting-balance support: Feet supported;No upper extremity supported Sitting balance-Leahy Scale: Fair Sitting balance - Comments: fair seated at EOB   Standing balance support: During functional activity;Bilateral upper extremity supported Standing balance-Leahy Scale: Fair Standing balance comment: using RW                            Cognition Arousal/Alertness: Awake/alert Behavior During Therapy: WFL for tasks assessed/performed Overall Cognitive Status: Within Functional Limits for tasks assessed  Exercises General Exercises - Lower Extremity Long Arc Quad: Seated;AROM;Strengthening;Both;10 reps Hip Flexion/Marching: Seated;AROM;Strengthening;AAROM;Both;10 reps Toe Raises:  Seated;AROM;Strengthening;Both;10 reps Heel Raises: Seated;AROM;Strengthening;Both;10 reps    General Comments        Pertinent Vitals/Pain Pain Assessment: No/denies pain    Home Living                      Prior Function            PT Goals (current goals can now be found in the care plan section) Acute Rehab PT Goals Patient Stated Goal: return home with family to assist PT Goal Formulation: With patient Time For Goal Achievement: 04/06/20 Potential to Achieve Goals: Good Progress towards PT goals: Progressing toward goals    Frequency    Min 3X/week      PT Plan      Co-evaluation              AM-PAC PT "6 Clicks" Mobility   Outcome Measure  Help needed turning from your back to your side while in a flat bed without using bedrails?: None Help needed moving from lying on your back to sitting on the side of a flat bed without using bedrails?: A Little Help needed moving to and from a bed to a chair (including a wheelchair)?: A Lot Help needed standing up from a chair using your arms (e.g., wheelchair or bedside chair)?: A Lot Help needed to walk in hospital room?: A Lot Help needed climbing 3-5 steps with a railing? : A Lot 6 Click Score: 15    End of Session Equipment Utilized During Treatment: Oxygen Activity Tolerance: Patient tolerated treatment well;Patient limited by fatigue Patient left: in bed;with call bell/phone within reach;with family/visitor present Nurse Communication: Mobility status PT Visit Diagnosis: Unsteadiness on feet (R26.81);Other abnormalities of gait and mobility (R26.89);Muscle weakness (generalized) (M62.81)     Time: 2585-2778 PT Time Calculation (min) (ACUTE ONLY): 34 min  Charges:  $Therapeutic Exercise: 8-22 mins $Therapeutic Activity: 8-22 mins                     3:48 PM, 03/25/20 Calvin Cruz, MPT Physical Therapist with [facility] [place] [address] [phone] office [phone] mobile phone

## 2020-03-25 NOTE — Progress Notes (Signed)
Patient just on NRB mask only -- saturation 94

## 2020-03-25 NOTE — Progress Notes (Signed)
PROGRESS NOTE    Calvin Cruz  WUJ:[id number] DOB: 05-07-1960 DOA: 03-21-2020 PCP: Reynold Bowen, MD   Brief Narrative:  60 year old male with a history ofsecondarypolycythemia(JAK2 neg), hyperlipidemia, diabetes mellitus type 2, hypertension, coronary artery disease status post NSTEMI with DES to the circumflex June 2016, ulcerative colitis status post proctocolectomy with ileoanal anastomosis, and recurrent episodes of pouchitis presenting with upper abdominal pain, nausea, vomiting, and loose stools that began on 03/04/2020. He states that he normally has 5-6 loose bowel movements on average day secondary to his ulcerative colitis. He normally uses Imodium, usually 1 to 2 tablets on a daily basis. His last bowel movement was on 03-21-2020. He denies any fevers, chills, chest pain, shortness of breath, cough, hemoptysis. He denies any headache, sore throat, hemoptysis. He has had numerous episodes of emesis on March 21, 2020 resulting in dry heaving. There is no hematemesis. He denies hematochezia or melena.He has not had a bowel movement since the afternoon of 03/12/2020. In the emergency department, the patient was afebrile hemodynamically stable with oxygen saturation 94-96% on room air. BMP showed a sodium 135, potassium 4.1, BUN 14, serum creatinine 0.81. LFTs were unremarkable. Lipase 21. WBC 7.1, hemoglobin 17.7, platelets 174,000. CT of the abdomen and pelvis showed mildly dilated air-fluid filled loops of ileum up to 3.2 cm. There was no clear transition point or focal narrowing. There is no surrounding inflammatory changes. He is status post colectomy with ileal colonic anastomosis.  -Patient is being treated for acute hypoxemic respiratory failure in the setting of COVID-19 pneumonia and continues to struggle with high oxygen requirements.  Pulmonology assisting with management.  Poor nutritional status noted after recent SBO/ileus and now with poor oral intake.  Plan for  PICC line placement and TPN to initiate after dietitian evaluation performed on 1/31.  Assessment & Plan:   Principal Problem:   Partial small bowel obstruction (HCC) Active Problems:   Hyperlipidemia with target LDL less than 70   Essential hypertension   CAD S/P DES PCI-circumflex   Abdominal pain   COVID-19 virus infection   Hyperglycemia due to diabetes mellitus (HCC)   Nausea and vomiting   Gastroenteritis due to COVID-19 virus   Abdominal distention   Hypoxemia   SOB (shortness of breath)   Acute respiratory failure with hypoxia due toCOVID-19pneumonia;patient also with COVID-19gastroenteritis/Infection -Continue steroids and baricitinib (last one day 7/14) -Completed remdesivir -Procalcitonin lowand repeat chest x-ray stable on 1/30, BNP low as well. Continue holding on use of diuretics or antibiotics currently. -Elevated D-dimers noted on 1/28 for which CT angiogram for PE study was ordered, neg results. -Plan to allow wife to visit to increase mobilization and compliance with resp activities. -Appreciate Pulmonology recommendations -continue to Wean O2 as tolerated  Partial small bowel obstruction-resolved -Patient currently onregular diet and tolerating -continue judicious use of narcotics; goal is not for pain free; but to assist tolerating symptoms.  -Continue the use of levsin as needed . -Continue PRN antiemetics.  -Will continue to follow recommendations by GI serviceprn -Patient also with improvement in his distention, having bowel sounds and had experienced multiple bowel movements overnight. No nausea or vomiting currently. -Patient has been instructed to increase physical activity. -Plan to slowly wean fentanyl use -Dietician consultation appreciated 1/31 -S/P PICC line placement and TPN initiated on 03/22/20 -low residue dysphagia 3 diet ordered.  Delirium/agitation -Haldol ordered as needed -Allowing wife to visit to further assist with  behavior. -continue constant reorientation and engagement into daily activities. -UA with pyuria; culture pending. -  patient denies dysuria, no fever and WBC's trending down in the setting of steroids usage.Marland Kitchen  Hyponatremia-likely related to SIADH and poor solute intake. -TSH within normal limits and serum osmolarity 272 -gentle fluid restriction will be pursuit. -follow electrolytes trend  -sodium corrected 129-131 -Should improve further with TPN, continue to monitor  Coronary artery disease with history of NSTEMI -No chest pain presently -Restart Brilinta when able to fully tolerate p.o. -Patient follows with Dr. Bryan Lemma -Beta-blocker has been resumed and dose adjusted as per home regimen..  Nonsustained ventricular tachycardia -As mentioned above beta-blocker dose has been adjusted -Continue to follow electrolytes and replete as needed. -rate much better controlled today -2- D echo demonstrated preserved ejection fraction, no regional wall motion normalities, positive left ventricle hypertrophy; indeterminate parameters for diastolic dysfunction.  No significant valvular disorder.  Diabetes mellitus type 2, controlled -Restart Jardianceas an outpatient -Continue sliding scale insulin while inpatient. -03/07/20 A1C--6.7 -Treatment for COVID infection using steroids contributing to elevated CBGs while inpatient. -Continue Lantus and further adjust based on CBGs fluctuation.  Hyperlipidemia -Restart Crestor and Vascepa once oral intake adequate.  Pituitary Tumor  -Found in 08/28/16 Brain MRI  -Will continue outpatient follow up with Dr. Evlyn Kanner.  Nasal congestion -Will add Flonase and saline spray. -continue weaning oxygen as tolerated.   Status is: Inpatient   Dispo: The patient is from:Home Anticipated d/c is to:SNF Anticipated d/c date is:To be determined Patient currently is not medically stable to d/c.  Still  requiring significant amount of oxygen supplementation.  Physical therapy recommending home health services.   Family Communication:spouse updated over the phone on 03/24/2020  Consultants:GI, general surgery, Pulmonology  Code Status: FULL  DVT Prophylaxis: Lovenox   Procedures: As Listed in Progress Note Above  Antimicrobials:  Anti-infectives (From admission, onward)   Start     Dose/Rate Route Frequency Ordered Stop   03/11/20 1000  remdesivir 100 mg in sodium chloride 0.9 % 100 mL IVPB        100 mg 200 mL/hr over 30 Minutes Intravenous Daily 03/10/20 1105 03/14/20 0900   03/10/20 1200  remdesivir 100 mg in sodium chloride 0.9 % 100 mL IVPB        100 mg 200 mL/hr over 30 Minutes Intravenous Every 1 hr x 2 03/10/20 1105 03/10/20 1320      Subjective: Still requiring significant amount of oxygen supplementation; no chest pain, no nausea, no vomiting, no abdominal pain.  Hypoglycemia appreciated in the setting of TPN, steroid usage.  No fever.  No overnight events.  Reports feeling better today.  Objective: Vitals:   03/25/20 0800 03/25/20 1200 03/25/20 1335 03/25/20 1500  BP:      Pulse:      Resp:      Temp: (!) 96.2 F (35.7 C) (!) 96 F (35.6 C)  97.6 F (36.4 C)  TempSrc: Axillary Axillary  Axillary  SpO2:   (!) 88%   Weight:      Height:        Intake/Output Summary (Last 24 hours) at 03/25/2020 1647 Last data filed at 03/25/2020 1305 Gross per 24 hour  Intake 30 ml  Output 2250 ml  Net -2220 ml   Filed Weights   03/21/20 0500 03/22/20 0415 03/23/20 0335  Weight: 93 kg 90.2 kg 89.3 kg    Examination: General exam: Alert, awake, oriented x 3; in good spirits; no overnight events.  Denies chest pain, no nausea, no vomiting, no abdominal pain currently.  Still short winded with  minimal exertion, expressed nasal congestion and intermittent coughing spells.  Blood sugar is elevated in the setting of TPN and steroids usage. Respiratory system:  Positive tachypnea with minimal exertion; no wheezing, no crackles, positive bilateral rhonchi appreciated.  No using accessory muscles to examination. Cardiovascular system: Rate controlled, no gallops, no gallops, no edema. No JVD on exam. Gastrointestinal system: Abdomen is nondistended, soft and nontender. No organomegaly or masses felt. Normal bowel sounds heard. Central nervous system: Alert and oriented. No focal neurological deficits. Extremities: No cyanosis or clubbing. Skin: No rashes, no petechiae. Psychiatry: Judgement and insight appear normal.  Flat affect; no hallucinations.;  Symptoms noted.    Data Reviewed: I have personally reviewed following labs and imaging studies  CBC: Recent Labs  Lab 03/21/20 0600 03/22/20 0814 03/23/20 0457 03/24/20 0754 03/25/20 0356  WBC 23.0* 16.0* 15.6* 14.8* 21.5*  NEUTROABS  --  15.2* 14.8* 14.0* 20.2*  HGB 15.2 14.9 14.6 15.0 16.1  HCT 43.2 41.6 41.3 41.8 45.1  MCV 88.9 88.9 89.0 88.2 87.7  PLT 224 218 270 263 [id number]   Basic Metabolic Panel: Recent Labs  Lab 03/21/20 0600 03/22/20 0537 03/22/20 1255 03/23/20 0457 03/24/20 0754 03/25/20 0356  NA 128* 125*  --  128* 126* 129*  K 4.3 5.8* 4.4 4.4 4.6 4.5  CL 94* 95*  --  92* 93* 92*  CO2 25 23  --  28 26 28   GLUCOSE 185* 165*  --  183* 289* 270*  BUN 18 14  --  15 15 17   CREATININE 0.50* 0.50*  --  0.36* 0.37* 0.36*  CALCIUM 8.1* 7.7*  --  8.0* 8.0* 8.5*  MG  --  2.1  --  2.1 1.9 2.1  PHOS  --   --   --  2.9 2.5  --    GFR: Estimated Creatinine Clearance: 122.5 mL/min (A) (by C-G formula based on SCr of 0.36 mg/dL (L)).   Liver Function Tests: Recent Labs  Lab 03/21/20 0600 03/22/20 0537 03/23/20 0457 03/24/20 0754 03/25/20 0356  AST 22 48* 17 18 18   ALT 16 13 14 18 18   ALKPHOS 54 59 53 49 51  BILITOT 1.4* 2.3* 0.9 0.9 0.9  PROT 5.5* 5.4* 5.6* 5.6* 6.0*  ALBUMIN 2.2* 2.2* 2.2* 2.3* 2.5*   CBG: Recent Labs  Lab 03/25/20 0038 03/25/20 0354 03/25/20 0757  03/25/20 1152 03/25/20 1625  GLUCAP 294* 258* 300* 279* 369*   Lipid Profile: Recent Labs    03/23/20 0457  TRIG 188*   Anemia Panel: Recent Labs    03/24/20 0607 03/25/20 0356  FERRITIN 1,503* 1,595*   Sepsis Labs: Recent Labs  Lab 03/20/20 0700  PROCALCITON 0.16    Recent Results (from the past 240 hour(s))  Urine Culture     Status: Abnormal (Preliminary result)   Collection Time: 03/23/20 10:01 PM   Specimen: Urine, Clean Catch  Result Value Ref Range Status   Specimen Description   Final    URINE, CLEAN CATCH Performed at [facility], [address], [place], [place] [address]    Special Requests   Final    NONE Performed at [facility], [address], [place], [place] [address]    Culture (A)  Final    >=100,000 COLONIES/mL ESCHERICHIA COLI SUSCEPTIBILITIES TO FOLLOW Performed at [facility], [place] [address] [place], [place] [address]    Report Status PENDING  Incomplete      Radiology Studies: DG Chest Port 1 View  Result Date:  03/25/2020 CLINICAL DATA:  COVID pneumonia, shortness of breath, hypertension, coronary artery disease post MI and coronary stenting EXAM: PORTABLE CHEST 1 VIEW COMPARISON:  Portable exam 0629 hours compared to 03/20/2020 FINDINGS: Normal heart size, mediastinal contours, and pulmonary vascularity. RIGHT arm PICC line tip projects over cavoatrial junction. Diffuse BILATERAL pulmonary infiltrates consistent with multifocal pneumonia and COVID-19, little changed. Azygos fissure noted. No pleural effusion or pneumothorax. IMPRESSION: Persistent pulmonary infiltrates consistent with multifocal pneumonia and COVID-19. Electronically Signed   By: Lavonia Dana M.D.   On: 03/25/2020 08:20   ECHOCARDIOGRAM COMPLETE  Result Date: 03/24/2020    ECHOCARDIOGRAM REPORT   Patient Name:   Calvin Cruz Date of Exam: 03/24/2020 Medical Rec #:  [id number]       Height:       78.0 in Accession #:    [id number]      Weight:       196.9 lb Date of  Birth:  [birth year]      BSA:          2.242 m Patient Age:    32 years        BP:           130/68 mmHg Patient Gender: M               HR:           95 bpm. Exam Location:  [facility] Procedure: 2D Echo Indications:    Ventricular Tachycardia I47.2  History:        Patient has prior history of Echocardiogram examinations, most                 recent 07/22/2014. CAD and Previous Myocardial Infarction; Risk                 Factors:Dyslipidemia, Hypertension and Former Smoker. Covid 19.  Sonographer:    Leavy Cella RDCS (AE) Referring Phys: [place]  1. Left ventricular ejection fraction, by estimation, is 55 to 60%. The left ventricle has normal function. The left ventricle has no regional wall motion abnormalities. There is moderate left ventricular hypertrophy. Left ventricular diastolic parameters are indeterminate.  2. Right ventricular systolic function is low normal. The right ventricular size is normal. Tricuspid regurgitation signal is inadequate for assessing PA pressure.  3. The mitral valve is grossly normal. Trivial mitral valve regurgitation.  4. The aortic valve is tricuspid. There is mild calcification of the aortic valve. Aortic valve regurgitation is not visualized.  5. The inferior vena cava is normal in size with greater than 50% respiratory variability, suggesting right atrial pressure of 3 mmHg. FINDINGS  Left Ventricle: Left ventricular ejection fraction, by estimation, is 55 to 60%. The left ventricle has normal function. The left ventricle has no regional wall motion abnormalities. The left ventricular internal cavity size was normal in size. There is  moderate left ventricular hypertrophy. Left ventricular diastolic parameters are indeterminate. Right Ventricle: The right ventricular size is normal. No increase in right ventricular wall thickness. Right ventricular systolic function is low normal. Tricuspid regurgitation signal is inadequate for assessing PA pressure.  Left Atrium: Left atrial size was normal in size. Right Atrium: Right atrial size was normal in size. Pericardium: There is no evidence of pericardial effusion. Mitral Valve: The mitral valve is grossly normal. There is mild thickening of the mitral valve leaflet(s). Mild mitral annular calcification. Trivial mitral valve regurgitation. Tricuspid Valve: The tricuspid valve is grossly normal. Tricuspid valve regurgitation is  trivial. Aortic Valve: The aortic valve is tricuspid. There is mild calcification of the aortic valve. There is mild to moderate aortic valve annular calcification. Aortic valve regurgitation is not visualized. Pulmonic Valve: The pulmonic valve was grossly normal. Pulmonic valve regurgitation is trivial. Aorta: The aortic root is normal in size and structure. Venous: The inferior vena cava is normal in size with greater than 50% respiratory variability, suggesting right atrial pressure of 3 mmHg. IAS/Shunts: No atrial level shunt detected by color flow Doppler.  LEFT VENTRICLE PLAX 2D LVIDd:         3.23 cm  Diastology LVIDs:         2.19 cm  LV e' medial:    5.22 cm/s LV PW:         1.89 cm  LV E/e' medial:  13.9 LV IVS:        1.20 cm  LV e' lateral:   11.90 cm/s LVOT diam:     1.90 cm  LV E/e' lateral: 6.1 LVOT Area:     2.84 cm  RIGHT VENTRICLE RV S prime:     21.30 cm/s TAPSE (M-mode): 2.1 cm LEFT ATRIUM           Index      RIGHT ATRIUM          Index LA diam:      3.30 cm 1.47 cm/m RA Area:     8.48 cm LA Vol (A2C): 17.1 ml 7.63 ml/m RA Volume:   16.90 ml 7.54 ml/m LA Vol (A4C): 21.3 ml 9.50 ml/m   AORTA Ao Root diam: 2.90 cm MITRAL VALVE MV Area (PHT): 3.06 cm    SHUNTS MV Decel Time: 248 msec    Systemic Diam: 1.90 cm MV E velocity: 72.70 cm/s MV A velocity: 74.90 cm/s MV E/A ratio:  0.97 Rozann Lesches MD Electronically signed by Rozann Lesches MD Signature Date/Time: 03/24/2020/11:43:16 AM    Final     Scheduled Meds: . vitamin C  1,000 mg Oral Daily  . baricitinib  4 mg  Oral Daily  . Chlorhexidine Gluconate Cloth  6 each Topical Daily  . cholecalciferol  5,000 Units Oral Daily  . diclofenac Sodium  4 g Topical QID  . enoxaparin (LOVENOX) injection  40 mg Subcutaneous Q12H  . insulin aspart  0-20 Units Subcutaneous Q4H  . insulin glargine  12 Units Subcutaneous BID  . Ipratropium-Albuterol  1 puff Inhalation TID  . lidocaine  1 patch Transdermal Q24H  . methylPREDNISolone (SOLU-MEDROL) injection  90 mg Intravenous Q12H  . metoprolol tartrate  5 mg Intravenous Q12H  . ondansetron (ZOFRAN) IV  4 mg Intravenous Q6H  . pantoprazole  40 mg Oral BID AC  . sodium chloride flush  10-40 mL Intracatheter Q12H  . zinc sulfate  220 mg Oral Daily   Continuous Infusions: . TPN ADULT (ION) 100 mL/hr at 03/24/20 1851  . TPN ADULT (ION)       LOS: 19 days    Time spent: 35 minutes    Barton Dubois, MD [facility]  If 7PM-7AM, please contact night-coverage [email or web address] 03/25/2020, 4:47 PM

## 2020-03-25 NOTE — Progress Notes (Signed)
NAME:  Calvin Cruz, MRN:  [id number], DOB:  05-21-1960, LOS: 13 ADMISSION DATE:  03/09/2020, CONSULTATION DATE:  03/21/2020 REFERRING MD:  Dr. Manuella Ghazi, [facility], CHIEF COMPLAINT:  Short of breath   Brief History:  60 yo male former smoker presented to ER on 03/18/2020 with nausea, vomiting and diarrhea.  He has not received COVID vaccination.  He was found to have COVID 19 infection and partial SBO.  He was seen by GI and surgery.  He had endoscopic insertion of NG tube.  He had persistent fever and increasing O2 needs, and started on remdesivir and steroids for COVID 19 pneumonia.  Transferred to ICU.  Started on baricitinib.  PCCM asked to assist with respiratory management.  Past Medical History:  Ulcerative colitis s/p colectomy, Gout, CAD s/p DES, HTN, HLD, Nephrolithiasis, DM, Erythrocytosis  Significant Hospital Events:  1/15 Admit 1/18 Endoscopic insertion of NG tube 1/20 start remdesivir, steroids 1/24 needing high flow oxygen and NRB 2/01 start TPN  Consults:  Gastroenterology s/o 1/28 Surgery s/o 1/19  Procedures:  Rt PICC 2/01 >>   Significant Diagnostic Tests:   CT abd/pelvis 1/16 >> lower lungs clear, mildly dilated air and fluid-filled loops of ileum measuring up to 3.2 cm. No clear transition point or focal wall narrowing  CT angio chest 1/28 >> diffuse b/l airspace opacity consistent with COVID 19 pneumonia  Micro Data:  COVID PCR 1/15 >> Positive Flu PCR 1/15 >> negative  Antimicrobials:  Remdesivir 1/20 >> 1/24 Steroids 1/20 >>  Baricitinib 1/27 >>   Interim History / Subjective:   Remains severely hypoxic on 100% / 40 L high flow nasal cannula plus nonrebreather Requiring frequent pain medications per RN  Objective   Blood pressure 124/79, pulse (!) 102, temperature (!) 96 F (35.6 C), temperature source Axillary, resp. rate 19, height 6\' 6"  (1.981 m), weight 89.3 kg, SpO2 (!) 88 %.    FiO2 (%):  [100 %] 100 %   Intake/Output Summary (Last 24 hours)  at 03/25/2020 1510 Last data filed at 03/25/2020 1305 Gross per 24 hour  Intake 30 ml  Output 2250 ml  Net -2220 ml   Filed Weights   03/21/20 0500 03/22/20 0415 03/23/20 0335  Weight: 93 kg 90.2 kg 89.3 kg    Examination:  General -elderly man, sitting up in bed, mild distress Eyes - pupils reactive ENT - no sinus tenderness, no stridor Cardiac - regular rate/rhythm, no murmur Chest -scattered bibasilar crackles, mild accessory muscle use Abdomen - soft, non tender, + bowel sounds Extremities - no cyanosis, clubbing, or edema Skin - no rashes Neuro -alert, interactive, nonfocal Psych - normal mood and behavior  Chest x-ray 2/4 independently reviewed, no change in bilateral diffuse lung disease. Labs show mild hyponatremia, stable renal function, hyperglycemia, increasing leukocytosis   Resolved Hospital Problem list     Assessment & Plan:   Acute hypoxic respiratory failure from COVID 19 pneumonia. - completed course of remdesivir -Solu-Medrol increased from 60-90 every 12 on 1/28 - goal SpO2 85 to 95% - bronchial hygiene, mobilize as able -He has persistent leukocytosis but no signs of infection/pneumonia - monitor need for intubation and mechanical ventilation  SBO/ileus. Ulcerative colitis.  -On TNA  CAD s/p DES, HTN, HLD. DM type 2 poorly controlled with steroid induced hyperglycemia. Hyponatremia. - per primary team  PCCM will follow with you next week   Best practice (evaluated daily)  Diet: regular diet/TPN DVT prophylaxis: lovenox GI prophylaxis: protonix Mobility: OOB to chair Disposition:  ICU Family: Per primary team Code Status: ICU  Labs    CMP Latest Ref Rng & Units 03/25/2020 03/24/2020 03/23/2020  Glucose 70 - 99 mg/dL 270(H) 289(H) 183(H)  BUN 8 - 23 mg/dL 17 15 15   Creatinine 0.61 - 1.24 mg/dL 0.36(L) 0.37(L) 0.36(L)  Sodium 135 - 145 mmol/L 129(L) 126(L) 128(L)  Potassium 3.5 - 5.1 mmol/L 4.5 4.6 4.4  Chloride 98 - 111 mmol/L 92(L) 93(L)  92(L)  CO2 22 - 32 mmol/L 28 26 28   Calcium 8.9 - 10.3 mg/dL 8.5(L) 8.0(L) 8.0(L)  Total Protein 6.5 - 8.1 g/dL 6.0(L) 5.6(L) 5.6(L)  Total Bilirubin 0.3 - 1.2 mg/dL 0.9 0.9 0.9  Alkaline Phos 38 - 126 U/L 51 49 53  AST 15 - 41 U/L 18 18 17   ALT 0 - 44 U/L 18 18 14     CBC Latest Ref Rng & Units 03/25/2020 03/24/2020 03/23/2020  WBC 4.0 - 10.5 K/uL 21.5(H) 14.8(H) 15.6(H)  Hemoglobin 13.0 - 17.0 g/dL 16.1 15.0 14.6  Hematocrit 39.0 - 52.0 % 45.1 41.8 41.3  Platelets 150 - 400 K/uL 308 263 270    ABG    Component Value Date/Time   PHART 7.472 (H) 03/20/2020 0300   PCO2ART 36.3 03/20/2020 0300   PO2ART 75.6 (L) 03/20/2020 0300   HCO3 27.2 03/20/2020 0300   O2SAT 94.9 03/20/2020 0300    CBG (last 3)  Recent Labs    03/25/20 0354 03/25/20 0757 03/25/20 1152  GLUCAP 258* 300* 279*    Signature:    Kara Mead MD. [facility]ade Flood. [facility] See Amion for pager  If no response to pager , please call [phone] until 7 pm After 7:00 pm call Elink  [phone]    03/25/2020, 3:10 PM

## 2020-03-25 NOTE — Progress Notes (Signed)
Inpatient Diabetes Program Recommendations  AACE/ADA: New Consensus Statement on Inpatient Glycemic Control   Target Ranges:  Prepandial:   less than 140 mg/dL      Peak postprandial:   less than 180 mg/dL (1-2 hours)      Critically ill patients:  140 - 180 mg/dL   Results for Calvin Cruz, Calvin Cruz" (MRN [id number]) as of 03/25/2020 09:01  Ref. Range 03/24/2020 07:48 03/24/2020 11:30 03/24/2020 12:48 03/24/2020 16:09 03/24/2020 19:49 03/25/2020 00:38 03/25/2020 03:54 03/25/2020 07:57  Glucose-Capillary Latest Ref Range: 70 - 99 mg/dL 306 (H)  Novolog 11 units     Lantus 12 units 234 (H)  Novolog 7 units 255 (H)  Novolog 7 units 316 (H)  Novolog 15 units  Lantus 12 units 294 (H)  Novolog 11 units 258 (H)  Novolog 11 units 300 (H)   Review of Glycemic Control  Diabetes history: DM2 Outpatient Diabetes medications: Jardiance 10 mg QAM Current orders for Inpatient glycemic control: Lantus 12 units BID, Novolog 0-20 units Q4H; TPN @ 100 ml/hr with 20 units of insulin, Solumedrol 90 mg Q12H  Inpatient Diabetes Program Recommendations:    Insulin: Would recommend increasing insulin in TPN versus increasing SQ insulin. If agreeable, please consider increasing insulin in TPN to 60 units.  Thanks, Barnie Alderman, RN, MSN, CDE Diabetes Coordinator Inpatient Diabetes Program [phone] (Team Pager from 8am to 5pm)

## 2020-03-25 NOTE — Progress Notes (Signed)
PHARMACY - TOTAL PARENTERAL NUTRITION CONSULT NOTE   Indication: Small bowel obstruction/ Unable to feed by NGT .   Patient Measurements: Height: 6\' 6"  (198.1 cm) Weight: 89.3 kg (196 lb 13.9 oz) IBW/kg (Calculated) : 91.4 TPN AdjBW (KG): 108.9 Body mass index is 22.75 kg/m. Usual Weight: 108.9 kg on admission, 90kg now  Assessment:  Patient is a 60 yo male with history of Ulcerative colitis s/p ileal anastomosis /colectomy, gout, CAD, DM2 and HTN. He presents with small bowel obstruction and COVID-19 pneumonia and gastroenteritis/infection.  SBO improved and on regular diet but unable to tolerate. Abdomen still distended.  Patient difficult NGT placement due to previous nasal surgeries. Concern with possible refeeding syndrome. Start at lower rate today to ensure tolerates.   Glucose / Insulin: 234-316/ 62 units insulin. Will give 2/3 in TPN Electrolytes: Na 126- potassium WNL now Renal: stable LFTs / TGs: TG 188 Prealbumin / albumin: Alb 2.2 Intake / Output; MIVF: 1800 IV/ 1117mls out GI Imaging: SBO resolved, but unable to eat still since 1/26 Surgeries / Procedures: none this admission  Central access: 03/22/2020 TPN start date: 03/22/2020  Nutritional Goals (per RD recommendation on 1/31): kCal: 9562-1308, Protein: 140-167g Fluid: per MD 2424mls/day  Goal TPN rate is 100 mL/hr (provides 144 g of protein 1963 kcals per day, can increase when add lipids)   Current Nutrition:  Low residue diet, but not tolerating   Plan:  Continue TPN to 100 mL/hr at 1800 Holding lipids at this time due to shortage- F/U initiation if needed. Electrolytes in TPN: 61mEq/L of Na, 40 mEq/L of K, 92mEq/L of Ca, 68mEq/L of Mg, and 58mmol/L of Phos. Cl:Ac ratio 1:1 Add standard MVI and trace elements to TPN Add 40 units insulin to TPN Adjust to resistant q4h SSI and adjust as needed  DC IV fluids at 1800 Monitor TPN labs on Mon/Thurs  Isac Sarna, BS [place], [place] Clinical Pharmacist Pager  [phone] 03/25/2020 9:06 AM

## 2020-03-26 ENCOUNTER — Inpatient Hospital Stay (HOSPITAL_COMMUNITY): Payer: Commercial Managed Care - PPO

## 2020-03-26 DIAGNOSIS — U071 COVID-19: Secondary | ICD-10-CM | POA: Diagnosis not present

## 2020-03-26 DIAGNOSIS — K56600 Partial intestinal obstruction, unspecified as to cause: Secondary | ICD-10-CM | POA: Diagnosis not present

## 2020-03-26 DIAGNOSIS — Z978 Presence of other specified devices: Secondary | ICD-10-CM | POA: Diagnosis not present

## 2020-03-26 DIAGNOSIS — B9620 Unspecified Escherichia coli [E. coli] as the cause of diseases classified elsewhere: Secondary | ICD-10-CM

## 2020-03-26 DIAGNOSIS — N390 Urinary tract infection, site not specified: Secondary | ICD-10-CM

## 2020-03-26 DIAGNOSIS — J9601 Acute respiratory failure with hypoxia: Secondary | ICD-10-CM | POA: Diagnosis not present

## 2020-03-26 LAB — URINE CULTURE: Culture: >=100000 — AB

## 2020-03-26 LAB — CBC WITH DIFFERENTIAL/PLATELET
Abs Immature Granulocytes: 0.23 10*3/uL — ABNORMAL HIGH (ref 0.00–0.07)
Abs Immature Granulocytes: 0.25 10*3/uL — ABNORMAL HIGH (ref 0.00–0.07)
Basophils Absolute: 0 10*3/uL (ref 0.0–0.1)
Basophils Absolute: 0 10*3/uL (ref 0.0–0.1)
Basophils Relative: 0 %
Basophils Relative: 0 %
Eosinophils Absolute: 0 10*3/uL (ref 0.0–0.5)
Eosinophils Absolute: 0 10*3/uL (ref 0.0–0.5)
Eosinophils Relative: 0 %
Eosinophils Relative: 0 %
HCT: 45.8 % (ref 39.0–52.0)
HCT: 39.1 % (ref 39.0–52.0)
Hemoglobin: 16.1 g/dL (ref 13.0–17.0)
Hemoglobin: 14 g/dL (ref 13.0–17.0)
Immature Granulocytes: 1 %
Immature Granulocytes: 1 %
Lymphocytes Relative: 4 %
Lymphocytes Relative: 7 %
Lymphs Abs: 0.7 10*3/uL (ref 0.7–4.0)
Lymphs Abs: 1.3 10*3/uL (ref 0.7–4.0)
MCH: 31 pg (ref 26.0–34.0)
MCH: 31.4 pg (ref 26.0–34.0)
MCHC: 35.2 g/dL (ref 30.0–36.0)
MCHC: 35.8 g/dL (ref 30.0–36.0)
MCV: 88.1 fL (ref 80.0–100.0)
MCV: 87.7 fL (ref 80.0–100.0)
Monocytes Absolute: 0.5 10*3/uL (ref 0.1–1.0)
Monocytes Absolute: 0.6 10*3/uL (ref 0.1–1.0)
Monocytes Relative: 2 %
Monocytes Relative: 3 %
Neutro Abs: 18.3 10*3/uL — ABNORMAL HIGH (ref 1.7–7.7)
Neutro Abs: 17.6 10*3/uL — ABNORMAL HIGH (ref 1.7–7.7)
Neutrophils Relative %: 93 %
Neutrophils Relative %: 89 %
Platelets: 295 10*3/uL (ref 150–400)
Platelets: 238 10*3/uL (ref 150–400)
RBC: 5.2 MIL/uL (ref 4.22–5.81)
RBC: 4.46 MIL/uL (ref 4.22–5.81)
RDW: 11.3 % — ABNORMAL LOW (ref 11.5–15.5)
RDW: 11.6 % (ref 11.5–15.5)
WBC: 19.7 10*3/uL — ABNORMAL HIGH (ref 4.0–10.5)
WBC: 19.8 10*3/uL — ABNORMAL HIGH (ref 4.0–10.5)
nRBC: 0 % (ref 0.0–0.2)
nRBC: 0 % (ref 0.0–0.2)

## 2020-03-26 LAB — COMPREHENSIVE METABOLIC PANEL
ALT: 24 U/L (ref 0–44)
AST: 20 U/L (ref 15–41)
Albumin: 2.6 g/dL — ABNORMAL LOW (ref 3.5–5.0)
Alkaline Phosphatase: 57 U/L (ref 38–126)
Anion gap: 9 (ref 5–15)
BUN: 22 mg/dL (ref 8–23)
CO2: 27 mmol/L (ref 22–32)
Calcium: 8.5 mg/dL — ABNORMAL LOW (ref 8.9–10.3)
Chloride: 93 mmol/L — ABNORMAL LOW (ref 98–111)
Creatinine, Ser: 0.43 mg/dL — ABNORMAL LOW (ref 0.61–1.24)
GFR, Estimated: >60 mL/min (ref >60)
Glucose, Bld: 230 mg/dL — ABNORMAL HIGH (ref 70–99)
Potassium: 4.7 mmol/L (ref 3.5–5.1)
Sodium: 129 mmol/L — ABNORMAL LOW (ref 135–145)
Total Bilirubin: 0.8 mg/dL (ref 0.3–1.2)
Total Protein: 6.5 g/dL (ref 6.5–8.1)

## 2020-03-26 LAB — D-DIMER, QUANTITATIVE: D-Dimer, Quant: 2.5 ug/mL-FEU — ABNORMAL HIGH (ref 0.00–0.50)

## 2020-03-26 LAB — BLOOD GAS, ARTERIAL
Acid-Base Excess: 2.3 mmol/L — ABNORMAL HIGH (ref 0.0–2.0)
Acid-Base Excess: 1.7 mmol/L (ref 0.0–2.0)
Bicarbonate: 24.7 mmol/L (ref 20.0–28.0)
Bicarbonate: 28 mmol/L (ref 20.0–28.0)
Drawn by: 59133
FIO2: 100
O2 Saturation: 95.6 %
O2 Saturation: 96.1 %
Patient temperature: 37
Patient temperature: 98
pCO2 arterial: 59.3 mmHg — ABNORMAL HIGH (ref 32.0–48.0)
pCO2 arterial: 52.1 mmHg — ABNORMAL HIGH (ref 32.0–48.0)
pH, Arterial: 7.298 — ABNORMAL LOW (ref 7.350–7.450)
pH, Arterial: 7.348 — ABNORMAL LOW (ref 7.350–7.450)
pO2, Arterial: 93.5 mmHg (ref 83.0–108.0)
pO2, Arterial: 85 mmHg (ref 83.0–108.0)

## 2020-03-26 LAB — GLUCOSE, CAPILLARY
Glucose-Capillary: 268 mg/dL — ABNORMAL HIGH (ref 70–99)
Glucose-Capillary: 264 mg/dL — ABNORMAL HIGH (ref 70–99)
Glucose-Capillary: 291 mg/dL — ABNORMAL HIGH (ref 70–99)
Glucose-Capillary: 267 mg/dL — ABNORMAL HIGH (ref 70–99)
Glucose-Capillary: 117 mg/dL — ABNORMAL HIGH (ref 70–99)
Glucose-Capillary: 104 mg/dL — ABNORMAL HIGH (ref 70–99)

## 2020-03-26 LAB — FERRITIN: Ferritin: 1445 ng/mL — ABNORMAL HIGH (ref 24–336)

## 2020-03-26 LAB — MAGNESIUM: Magnesium: 2.1 mg/dL (ref 1.7–2.4)

## 2020-03-26 LAB — C-REACTIVE PROTEIN: CRP: 0.6 mg/dL (ref <1.0)

## 2020-03-26 MED ORDER — MIDAZOLAM BOLUS VIA INFUSION
1.0000 mg | INTRAVENOUS | Status: DC | PRN
  Administered 2020-03-26 – 2020-03-30 (×17): 2 mg via INTRAVENOUS
  Filled 2020-03-26: qty 2

## 2020-03-26 MED ORDER — PROPOFOL 1000 MG/100ML IV EMUL
INTRAVENOUS | Status: AC
  Administered 2020-03-26: 20 ug/kg/min via INTRAVENOUS
  Filled 2020-03-26: qty 100

## 2020-03-26 MED ORDER — POLYETHYLENE GLYCOL 3350 17 G PO PACK
17.0000 g | PACK | Freq: Every day | ORAL | Status: DC
  Administered 2020-03-28 – 2020-04-14 (×14): 17 g
  Filled 2020-03-26 (×14): qty 1

## 2020-03-26 MED ORDER — TRAVASOL 10 % IV SOLN
INTRAVENOUS | Status: AC
  Filled 2020-03-26: qty 1440

## 2020-03-26 MED ORDER — SODIUM CHLORIDE 0.9 % IV SOLN
1.5000 g | Freq: Four times a day (QID) | INTRAVENOUS | Status: AC
  Administered 2020-03-26 – 2020-04-02 (×29): 1.5 g via INTRAVENOUS
  Filled 2020-03-26 (×13): qty 4
  Filled 2020-03-26: qty 1.5
  Filled 2020-03-26 (×3): qty 4
  Filled 2020-03-26 (×2): qty 1.5
  Filled 2020-03-26 (×9): qty 4
  Filled 2020-03-26: qty 1.5
  Filled 2020-03-26 (×9): qty 4

## 2020-03-26 MED ORDER — DOCUSATE SODIUM 50 MG/5ML PO LIQD: 100.0000 mg | Freq: Two times a day (BID) | ORAL | Status: DC

## 2020-03-26 MED ORDER — POLYETHYLENE GLYCOL 3350 17 G PO PACK: 17.0000 g | PACK | Freq: Every day | ORAL | Status: DC

## 2020-03-26 MED ORDER — ORAL CARE MOUTH RINSE
15.0000 mL | OROMUCOSAL | Status: DC
  Administered 2020-03-26 – 2020-04-15 (×185): 15 mL via OROMUCOSAL

## 2020-03-26 MED ORDER — MIDAZOLAM 50MG/50ML (1MG/ML) PREMIX INFUSION
0.0000 mg/h | INTRAVENOUS | Status: DC
  Administered 2020-03-26: 2 mg/h via INTRAVENOUS
  Administered 2020-03-27: 6 mg/h via INTRAVENOUS
  Administered 2020-03-27: 4 mg/h via INTRAVENOUS
  Administered 2020-03-27: 6 mg/h via INTRAVENOUS
  Administered 2020-03-28: 1 mg/h via INTRAVENOUS
  Administered 2020-03-28: 8 mg/h via INTRAVENOUS
  Administered 2020-03-28: 7 mg/h via INTRAVENOUS
  Administered 2020-03-28: 9 mg/h via INTRAVENOUS
  Administered 2020-03-29: 10 mg/h via INTRAVENOUS
  Administered 2020-03-29: 2 mg/h via INTRAVENOUS
  Administered 2020-03-29: 10 mg/h via INTRAVENOUS
  Administered 2020-03-29: 9 mg/h via INTRAVENOUS
  Administered 2020-03-30: 6 mg/h via INTRAVENOUS
  Filled 2020-03-26 (×13): qty 50

## 2020-03-26 MED ORDER — IPRATROPIUM-ALBUTEROL 0.5-2.5 (3) MG/3ML IN SOLN
3.0000 mL | Freq: Three times a day (TID) | RESPIRATORY_TRACT | Status: DC
  Administered 2020-03-26 – 2020-03-27 (×4): 3 mL via RESPIRATORY_TRACT
  Filled 2020-03-26 (×4): qty 3

## 2020-03-26 MED ORDER — MIDAZOLAM 50MG/50ML (1MG/ML) PREMIX INFUSION: 0.0000 mg/h | INTRAVENOUS | Status: DC

## 2020-03-26 MED ORDER — MIDAZOLAM HCL 2 MG/2ML IJ SOLN: 2.0000 mg | INTRAMUSCULAR | Status: DC | PRN

## 2020-03-26 MED ORDER — PROPOFOL 1000 MG/100ML IV EMUL: 0.0000 ug/kg/min | INTRAVENOUS | Status: DC

## 2020-03-26 MED ORDER — FENTANYL BOLUS VIA INFUSION
50.0000 ug | INTRAVENOUS | Status: DC | PRN
Start: 2020-03-26 — End: 2020-03-26
  Filled 2020-03-26: qty 50

## 2020-03-26 MED ORDER — CHLORHEXIDINE GLUCONATE 0.12% ORAL RINSE (MEDLINE KIT)
15.0000 mL | Freq: Two times a day (BID) | OROMUCOSAL | Status: DC
  Administered 2020-03-26 – 2020-04-14 (×41): 15 mL via OROMUCOSAL

## 2020-03-26 MED ORDER — FENTANYL CITRATE (PF) 100 MCG/2ML IJ SOLN
50.0000 ug | INTRAMUSCULAR | Status: AC | PRN
  Administered 2020-03-26 – 2020-03-27 (×5): 50 ug via INTRAVENOUS

## 2020-03-26 MED ORDER — FENTANYL 2500MCG IN NS 250ML (10MCG/ML) PREMIX INFUSION
0.0000 ug/h | INTRAVENOUS | Status: DC
  Administered 2020-03-26: 50 ug/h via INTRAVENOUS
  Administered 2020-03-27 – 2020-03-29 (×6): 200 ug/h via INTRAVENOUS
  Administered 2020-03-29: 21:00:00 150 ug/h via INTRAVENOUS
  Administered 2020-03-30: 18:00:00 200 ug/h via INTRAVENOUS
  Administered 2020-03-30: 175 ug/h via INTRAVENOUS
  Administered 2020-03-31: 150 ug/h via INTRAVENOUS
  Administered 2020-03-31 (×2): 200 ug/h via INTRAVENOUS
  Administered 2020-04-01 (×2): 350 ug/h via INTRAVENOUS
  Administered 2020-04-02 (×4): 400 ug/h via INTRAVENOUS
  Administered 2020-04-03: 15:00:00 300 ug/h via INTRAVENOUS
  Administered 2020-04-03: 01:00:00 400 ug/h via INTRAVENOUS
  Administered 2020-04-03 – 2020-04-04 (×3): 300 ug/h via INTRAVENOUS
  Administered 2020-04-04: 13:00:00 400 ug/h via INTRAVENOUS
  Administered 2020-04-04 – 2020-04-05 (×4): 350 ug/h via INTRAVENOUS
  Administered 2020-04-06: 200 ug/h via INTRAVENOUS
  Administered 2020-04-06: 350 ug/h via INTRAVENOUS
  Administered 2020-04-06 – 2020-04-07 (×2): 250 ug/h via INTRAVENOUS
  Administered 2020-04-07: 200 ug/h via INTRAVENOUS
  Administered 2020-04-07: 250 ug/h via INTRAVENOUS
  Administered 2020-04-08: 150 ug/h via INTRAVENOUS
  Administered 2020-04-09 – 2020-04-11 (×3): 100 ug/h via INTRAVENOUS
  Administered 2020-04-12: 50 ug/h via INTRAVENOUS
  Administered 2020-04-13 – 2020-04-14 (×2): 100 ug/h via INTRAVENOUS
  Filled 2020-03-26 (×43): qty 250

## 2020-03-26 MED ORDER — MIDAZOLAM BOLUS VIA INFUSION: 1.0000 mg | INTRAVENOUS | Status: DC | PRN

## 2020-03-26 MED ORDER — FENTANYL CITRATE (PF) 100 MCG/2ML IJ SOLN
50.0000 ug | INTRAMUSCULAR | Status: DC | PRN
  Administered 2020-03-26: 100 ug via INTRAVENOUS
  Filled 2020-03-26: qty 2

## 2020-03-26 MED ORDER — FENTANYL CITRATE (PF) 100 MCG/2ML IJ SOLN: 50.0000 ug | Freq: Once | INTRAMUSCULAR | Status: DC

## 2020-03-26 MED ORDER — SODIUM CHLORIDE 0.9 % IV SOLN
1.0000 g | INTRAVENOUS | Status: DC
  Administered 2020-03-26: 1 g via INTRAVENOUS
  Filled 2020-03-26: qty 10

## 2020-03-26 MED ORDER — ROCURONIUM BROMIDE 50 MG/5ML IV SOLN
45.0000 mg | Freq: Once | INTRAVENOUS | Status: AC
  Administered 2020-03-26: 45 mg via INTRAVENOUS
  Filled 2020-03-26: qty 4.5

## 2020-03-26 MED ORDER — DOCUSATE SODIUM 50 MG/5ML PO LIQD
100.0000 mg | Freq: Two times a day (BID) | ORAL | Status: DC
  Administered 2020-03-27 – 2020-04-14 (×34): 100 mg
  Filled 2020-03-26 (×35): qty 10

## 2020-03-26 MED ORDER — CHLORHEXIDINE GLUCONATE CLOTH 2 % EX PADS
6.0000 | MEDICATED_PAD | Freq: Every day | CUTANEOUS | Status: DC
  Administered 2020-03-28 – 2020-04-14 (×18): 6 via TOPICAL

## 2020-03-26 NOTE — H&P (Signed)
NAME:  Calvin Cruz, MRN:  [id number], DOB:  11/17/1960, LOS: 79 ADMISSION DATE:  03/04/2020, CONSULTATION DATE:  03/21/2020 REFERRING MD:  Dr. Manuella Ghazi, [facility], CHIEF COMPLAINT:  Short of breath   Brief History:  60 yo male former smoker presented to ER on 03/03/2020 with nausea, vomiting and diarrhea.  He has not received COVID vaccination.  He was found to have COVID 19 infection and partial SBO.  He was seen by GI and surgery.  He had endoscopic insertion of NG tube.  He had persistent fever and increasing O2 needs, and started on remdesivir and steroids for COVID 19 pneumonia.  Transferred to ICU.  Started on baricitinib.  PCCM asked to assist with respiratory management.  Past Medical History:  Ulcerative colitis s/p colectomy, Gout, CAD s/p DES, HTN, HLD, Nephrolithiasis, DM, Erythrocytosis  Significant Hospital Events:  1/15 Admit 1/18 Endoscopic insertion of NG tube 1/20 start remdesivir, steroids 1/24 needing high flow oxygen and NRB 2/01 start TPN  Consults:  Gastroenterology s/o 1/28 Surgery s/o 1/19  Procedures:  Rt PICC 2/01 >>   Significant Diagnostic Tests:   CT abd/pelvis 1/16 >> lower lungs clear, mildly dilated air and fluid-filled loops of ileum measuring up to 3.2 cm. No clear transition point or focal wall narrowing  CT angio chest 1/28 >> diffuse b/l airspace opacity consistent with COVID 19 pneumonia  Micro Data:  COVID PCR 1/15 >> Positive Flu PCR 1/15 >> negative  Antimicrobials:  Remdesivir 1/20 >> 1/24 Steroids 1/20 >>  Baricitinib 1/27 >>   Interim History / Subjective:   Critically ill intubated mechanical life support.  Objective   Blood pressure 103/74, pulse (!) 113, temperature (!) 97.1 F (36.2 C), temperature source Axillary, resp. rate (!) 22, height 6\' 6"  (1.981 m), weight 89.3 kg, SpO2 95 %.    Vent Mode: PCV FiO2 (%):  [100 %] 100 % PEEP:  [10 cmH20] 10 cmH20   Intake/Output Summary (Last 24 hours) at 03/26/2020 1727 Last data  filed at 03/26/2020 1300 Gross per 24 hour  Intake 3303.46 ml  Output 1850 ml  Net 1453.46 ml   Filed Weights   03/21/20 0500 03/22/20 0415 03/23/20 0335  Weight: 93 kg 90.2 kg 89.3 kg    Examination:  General -elderly male intubated on mechanical life support Eyes -pupils pinpoint, sedated on fentanyl ENT -endotracheal tube in place Cardiac -regular rate rhythm, S1-S2 Lungs: Bilateral mechanically ventilated breath sounds Abdomen -soft nontender nondistended Extremities -no cyanosis no edema Skin -no rash Neuro -sedated on mechanical support RASS -4  Chest x-ray 03/26/2020: ET tube really high.  Needs to be advanced on chest x-ray prior to transport hopefully this was done.  We will repeat another chest film to make sure. The patient's images have been independently reviewed by me.     Resolved Hospital Problem list     Assessment & Plan:   Acute hypoxic respiratory failure from COVID 19 pneumonia. Possible aspiration event requiring intubation mechanical ventilation Plan: Completed course of remdesivir Solu-Medrol continued Baricitinib continued Was on BiPAP had aspiration event Intubated on 03/26/2020 and transferred from [facility]. Adult mechanical vent protocol VAP prophylaxis PAD guidelines for sedation Switch antibiotics to Unasyn Trach aspirate cx  Checks x-ray to confirm ET tube placement.  SBO/ileus. Ulcerative colitis. Presented also with gastroenteritis and increased stool volumes on presentation -On TNA   CAD s/p DES, HTN, HLD. Hyponatremia.  DM type 2 poorly controlled with steroid induced hyperglycemia. Hyper glycemia secondary to steroids COVID glycemic  control protocol. Goal CBG 140-180   Best practice (evaluated daily)  Diet: regular diet/TPN DVT prophylaxis: lovenox GI prophylaxis: protonix Mobility: OOB to chair Disposition: ICU Family: Per primary team Code Status: ICU  Labs    CMP Latest Ref Rng & Units 03/26/2020 03/25/2020  03/24/2020  Glucose 70 - 99 mg/dL 230(H) 270(H) 289(H)  BUN 8 - 23 mg/dL 22 17 15   Creatinine 0.61 - 1.24 mg/dL 0.43(L) 0.36(L) 0.37(L)  Sodium 135 - 145 mmol/L 129(L) 129(L) 126(L)  Potassium 3.5 - 5.1 mmol/L 4.7 4.5 4.6  Chloride 98 - 111 mmol/L 93(L) 92(L) 93(L)  CO2 22 - 32 mmol/L 27 28 26   Calcium 8.9 - 10.3 mg/dL 8.5(L) 8.5(L) 8.0(L)  Total Protein 6.5 - 8.1 g/dL 6.5 6.0(L) 5.6(L)  Total Bilirubin 0.3 - 1.2 mg/dL 0.8 0.9 0.9  Alkaline Phos 38 - 126 U/L 57 51 49  AST 15 - 41 U/L 20 18 18   ALT 0 - 44 U/L 24 18 18     CBC Latest Ref Rng & Units 03/26/2020 03/25/2020 03/24/2020  WBC 4.0 - 10.5 K/uL 19.7(H) 21.5(H) 14.8(H)  Hemoglobin 13.0 - 17.0 g/dL 16.1 16.1 15.0  Hematocrit 39.0 - 52.0 % 45.8 45.1 41.8  Platelets 150 - 400 K/uL 295 308 263    ABG    Component Value Date/Time   PHART 7.298 (L) 03/26/2020 1714   PCO2ART 59.3 (H) 03/26/2020 1714   PO2ART 93.5 03/26/2020 1714   HCO3 24.7 03/26/2020 1714   O2SAT 95.6 03/26/2020 1714    CBG (last 3)  Recent Labs    03/26/20 0813 03/26/20 1132 03/26/20 1616  GLUCAP 264* 291* 267*    This patient is critically ill with multiple organ system failure; which, requires frequent high complexity decision making, assessment, support, evaluation, and titration of therapies. This was completed through the application of advanced monitoring technologies and extensive interpretation of multiple databases. During this encounter critical care time was devoted to patient care services described in this note for 32 minutes.  [facility] 03/26/2020 7:08 PM

## 2020-03-26 NOTE — Progress Notes (Signed)
[facility] to transport Patient to [facility] ICU.

## 2020-03-26 NOTE — Progress Notes (Addendum)
PROGRESS NOTE    Calvin Cruz  [id number] DOB: [birth year] DOA: 02/22/2020 PCP: Reynold Bowen, MD   Brief Narrative:  60 year old male with a history ofsecondarypolycythemia(JAK2 neg), hyperlipidemia, diabetes mellitus type 2, hypertension, coronary artery disease status post NSTEMI with DES to the circumflex June 2016, ulcerative colitis status post proctocolectomy with ileoanal anastomosis, and recurrent episodes of pouchitis presenting with upper abdominal pain, nausea, vomiting, and loose stools that began on 03/04/2020. He states that he normally has 5-6 loose bowel movements on average day secondary to his ulcerative colitis. He normally uses Imodium, usually 1 to 2 tablets on a daily basis. His last bowel movement was on 03/21/2020. He denies any fevers, chills, chest pain, shortness of breath, cough, hemoptysis. He denies any headache, sore throat, hemoptysis. He has had numerous episodes of emesis on 02/21/2020 resulting in dry heaving. There is no hematemesis. He denies hematochezia or melena.He has not had a bowel movement since the afternoon of 03/12/2020. In the emergency department, the patient was afebrile hemodynamically stable with oxygen saturation 94-96% on room air. BMP showed a sodium 135, potassium 4.1, BUN 14, serum creatinine 0.81. LFTs were unremarkable. Lipase 21. WBC 7.1, hemoglobin 17.7, platelets 174,000. CT of the abdomen and pelvis showed mildly dilated air-fluid filled loops of ileum up to 3.2 cm. There was no clear transition point or focal narrowing. There is no surrounding inflammatory changes. He is status post colectomy with ileal colonic anastomosis.  -Patient is being treated for acute hypoxemic respiratory failure in the setting of COVID-19 pneumonia and continues to struggle with high oxygen requirements.  Pulmonology assisting with management.  Poor nutritional status noted after recent SBO/ileus and now with poor oral intake.  Plan for  PICC line placement and TPN to initiate after dietitian evaluation performed on 1/31.  -03/26/20 worsening resp failure, tachypnea and desaturation; ?? Mucus plug. Required ventilatory support.   Assessment & Plan:   Principal Problem:   Partial small bowel obstruction (HCC) Active Problems:   Hyperlipidemia with target LDL less than 70   Essential hypertension   CAD S/P DES PCI-circumflex   Abdominal pain   COVID-19 virus infection   Hyperglycemia due to diabetes mellitus (HCC)   Nausea and vomiting   Gastroenteritis due to COVID-19 virus   Abdominal distention   Hypoxemia   SOB (shortness of breath)   Acute respiratory failure with hypoxia due toCOVID-19pneumonia;patient also with COVID-19gastroenteritis/Infection -Continue steroids and baricitinib (last one day 8/14) -Completed remdesivir -Elevated D-dimers noted on 1/28 for which CT angiogram for PE study was ordered, neg results. -worsening resp failure, use of accessory muscles and tachypnea with desaturation on 03/26/20, patient required mechanical ventilation.  -Appreciate Pulmonology recommendations and assistance.  Partial small bowel obstruction-resolved -Patient currently onregular diet and tolerating -continue judicious use of narcotics; goal is not for pain free; but to assist tolerating symptoms.  -Continue the use of levsin as needed . -Continue PRN antiemetics.  -Will continue to follow recommendations by GI serviceprn -Patient also with improvement in his distention, having bowel sounds and had experienced multiple bowel movements overnight. No nausea or vomiting currently. -Patient has been instructed to increase physical activity. -Plan to slowly wean fentanyl use -Dietician consultation appreciated 1/31 -S/P PICC line placement and TPN initiated on 03/22/20 -low residue dysphagia 3 diet ordered.  Delirium/agitation -Haldol ordered as needed -Allowing wife to visit to further assist with  behavior. -continue constant reorientation and engagement into daily activities. -UA with pyuria; urine culture demonstrating E. Coli UTI (> 100,000  colonies appreciated) -patient denies dysuria, no fever and WBC's trending down in the setting of steroids usage..  E. Coli UTI -started on Rocephin with plans for 5 days total, as patient now intubated and requiring foley catheter insertion.  Hyponatremia-likely related to SIADH and poor solute intake. -TSH within normal limits and serum osmolarity 272 -gentle fluid restriction will be pursuit. -follow electrolytes trend  -sodium corrected 130-132 -Should improve further with TPN, continue to monitor  Coronary artery disease with history of NSTEMI -No chest pain presently -Restart Brilinta when able to fully tolerate p.o. -Patient follows with Dr. Glenetta Hew -Beta-blocker has been resumed and dose adjusted as per home regimen..  Nonsustained ventricular tachycardia -As mentioned above beta-blocker dose adjusted as per rec's by cardiology service and home dose. -Continue to follow electrolytes and replete them as needed. -2- D echo demonstrated preserved ejection fraction, no regional wall motion normalities, positive left ventricle hypertrophy; indeterminate parameters for diastolic dysfunction.  No significant valvular disorder. -continue telemetry monitoring.  Diabetes mellitus type 2, controlled -Restart Jardianceas an outpatient -Continue sliding scale insulin while inpatient. -03/07/20 A1C--6.7 -Treatment for COVID infection using steroids and TPN contributing to elevated CBGs while inpatient. -Continue Lantus and further adjust based on CBGs fluctuation.  Hyperlipidemia -Restart Crestor and Vascepa once oral intake adequate.  Pituitary Tumor  -Found in 08/28/16 Brain MRI  -Will continue outpatient follow up with Dr. Forde Dandy.  Nasal congestion -flonase and saline spray was used.  Status is: Inpatient   Dispo:  The patient is from:Home Anticipated d/c is to:SNF Anticipated d/c date is:To be determined Patient currently is not medically stable to d/c.  Now intubated and mechanically ventilated. Case will be discussed with critical care for transfer to [facility] for further care and management (anticipating prolonged intubation and possibly even the need for bronchoscopy)..  Family Communication:spouse updated over the phone on 03/24/2020  Consultants:GI, general surgery, Pulmonology  Code Status: FULL  DVT Prophylaxis: Lovenox   Procedures: As Listed in Progress Note Above  Antimicrobials:  Anti-infectives (From admission, onward)   Start     Dose/Rate Route Frequency Ordered Stop   03/11/20 1000  remdesivir 100 mg in sodium chloride 0.9 % 100 mL IVPB        100 mg 200 mL/hr over 30 Minutes Intravenous Daily 03/10/20 1105 03/14/20 0900   03/10/20 1200  remdesivir 100 mg in sodium chloride 0.9 % 100 mL IVPB        100 mg 200 mL/hr over 30 Minutes Intravenous Every 1 hr x 2 03/10/20 1105 03/10/20 1320      Subjective: Good night, no CP, no fever. Mid morning with increase work of breathing, RR in the 50's, desaturating despite the use of NR and HFNC. Patient got intubated for air protection and to assure adequate oxygen delivery to vital organs.  Objective: Vitals:   03/26/20 0900 03/26/20 1000 03/26/20 1100 03/26/20 1200  BP: (!) 143/82 (!) 115/56 127/73 (!) 147/74  Pulse: (!) 125 99 (!) 101 (!) 111  Resp: (!) 26 (!) 29 (!) 25 (!) 41  Temp:      TempSrc:      SpO2: (!) 87% (!) 81% (!) 84% (!) 84%  Weight:      Height:        Intake/Output Summary (Last 24 hours) at 03/26/2020 1319 Last data filed at 03/26/2020 0600 Gross per 24 hour  Intake 3303.46 ml  Output 1550 ml  Net 1753.46 ml   Filed Weights   03/21/20 0500  03/22/20 0415 03/23/20 0335  Weight: 93 kg 90.2 kg 89.3 kg    Examination: General exam: afebrile,  no nausea, no vomiting, decrease appetite. Overnight did pretty good and didn't required BIPAP; in fact was able to come down in his oxygen supplementation needs an use only NR mask with 15L. Mid morning his resp status started to decompensated, patient remains tachypneic (RR in 50's), desaturating and very agitated. Anxiolytics and BIPAP failed to alleviate air hunger and patient ended requiring to be intubated to protect airways and provide adequate supplementation. Respiratory system: no wheezing, positive rhonchi bilaterally, no crackles on exam. Using accessory muscles and with positive tachypnea, RR 51. Cardiovascular system: sinus tachycardia, no rubs no gallops, no JVD Gastrointestinal system: Abdomen is nondistended, soft and nontender. No organomegaly or masses felt. Normal bowel sounds heard. Central nervous system: No focal neurological deficits. RUE with PICC line in place. Extremities: No cyanosis, no clubbing. Skin: No rashes, no petechiae. Psychiatry: Judgement and insight appear normal. Oriented and following commands. Until sedation given and patient was intubated.   Data Reviewed: I have personally reviewed following labs and imaging studies  CBC: Recent Labs  Lab 03/22/20 0814 03/23/20 0457 03/24/20 0754 03/25/20 0356 03/26/20 0224  WBC 16.0* 15.6* 14.8* 21.5* 19.7*  NEUTROABS 15.2* 14.8* 14.0* 20.2* 18.3*  HGB 14.9 14.6 15.0 16.1 16.1  HCT 41.6 41.3 41.8 45.1 45.8  MCV 88.9 89.0 88.2 87.7 88.1  PLT 218 270 263 308 [id number]   Basic Metabolic Panel: Recent Labs  Lab 03/22/20 0537 03/22/20 1255 03/23/20 0457 03/24/20 0754 03/25/20 0356 03/26/20 0224  NA 125*  --  128* 126* 129* 129*  K 5.8* 4.4 4.4 4.6 4.5 4.7  CL 95*  --  92* 93* 92* 93*  CO2 23  --  28 26 28 27   GLUCOSE 165*  --  183* 289* 270* 230*  BUN 14  --  15 15 17 22   CREATININE 0.50*  --  0.36* 0.37* 0.36* 0.43*  CALCIUM 7.7*  --  8.0* 8.0* 8.5* 8.5*  MG 2.1  --  2.1 1.9 2.1 2.1  PHOS  --   --  2.9  2.5  --   --    GFR: Estimated Creatinine Clearance: 122.5 mL/min (A) (by C-G formula based on SCr of 0.43 mg/dL (L)).   Liver Function Tests: Recent Labs  Lab 03/22/20 0537 03/23/20 0457 03/24/20 0754 03/25/20 0356 03/26/20 0224  AST 48* 17 18 18 20   ALT 13 14 18 18 24   ALKPHOS 59 53 49 51 57  BILITOT 2.3* 0.9 0.9 0.9 0.8  PROT 5.4* 5.6* 5.6* 6.0* 6.5  ALBUMIN 2.2* 2.2* 2.3* 2.5* 2.6*   CBG: Recent Labs  Lab 03/25/20 2015 03/25/20 2338 03/26/20 0534 03/26/20 0813 03/26/20 1132  GLUCAP 259* 239* 268* 264* 291*   Anemia Panel: Recent Labs    03/25/20 0356 03/26/20 0224  FERRITIN 1,595* 1,445*   Sepsis Labs: Recent Labs  Lab 03/20/20 0700  PROCALCITON 0.16    Recent Results (from the past 240 hour(s))  Urine Culture     Status: Abnormal   Collection Time: 03/23/20 10:01 PM   Specimen: Urine, Clean Catch  Result Value Ref Range Status   Specimen Description   Final    URINE, CLEAN CATCH Performed at [facility], [address], [place], [place] [address]    Special Requests   Final    NONE Performed at [facility], [address], [place], [place] [address]    Culture >=  100,000 COLONIES/mL ESCHERICHIA COLI (A)  Final   Report Status 03/26/2020 FINAL  Final   Organism ID, Bacteria ESCHERICHIA COLI (A)  Final      Susceptibility   Escherichia coli - MIC*    AMPICILLIN 4 SENSITIVE Sensitive     CEFAZOLIN <=4 SENSITIVE Sensitive     CEFEPIME <=0.12 SENSITIVE Sensitive     CEFTRIAXONE 1 SENSITIVE Sensitive     CIPROFLOXACIN <=0.25 SENSITIVE Sensitive     GENTAMICIN <=1 SENSITIVE Sensitive     IMIPENEM <=0.25 SENSITIVE Sensitive     NITROFURANTOIN <=16 SENSITIVE Sensitive     TRIMETH/SULFA <=20 SENSITIVE Sensitive     AMPICILLIN/SULBACTAM <=2 SENSITIVE Sensitive     PIP/TAZO <=4 SENSITIVE Sensitive     * >=100,000 COLONIES/mL ESCHERICHIA COLI      Radiology Studies: DG Chest Port 1 View  Result Date: 03/25/2020 CLINICAL DATA:  COVID pneumonia,  shortness of breath, hypertension, coronary artery disease post MI and coronary stenting EXAM: PORTABLE CHEST 1 VIEW COMPARISON:  Portable exam 0629 hours compared to 03/20/2020 FINDINGS: Normal heart size, mediastinal contours, and pulmonary vascularity. RIGHT arm PICC line tip projects over cavoatrial junction. Diffuse BILATERAL pulmonary infiltrates consistent with multifocal pneumonia and COVID-19, little changed. Azygos fissure noted. No pleural effusion or pneumothorax. IMPRESSION: Persistent pulmonary infiltrates consistent with multifocal pneumonia and COVID-19. Electronically Signed   By: Lavonia Dana M.D.   On: 03/25/2020 08:20    Scheduled Meds: . vitamin C  1,000 mg Oral Daily  . baricitinib  4 mg Oral Daily  . Chlorhexidine Gluconate Cloth  6 each Topical Daily  . cholecalciferol  5,000 Units Oral Daily  . diclofenac Sodium  4 g Topical QID  . docusate  100 mg Per Tube BID  . enoxaparin (LOVENOX) injection  40 mg Subcutaneous Q12H  . fluticasone  1 spray Each Nare Daily  . insulin aspart  0-20 Units Subcutaneous Q4H  . insulin glargine  18 Units Subcutaneous BID  . Ipratropium-Albuterol  1 puff Inhalation TID  . lidocaine  1 patch Transdermal Q24H  . methylPREDNISolone (SOLU-MEDROL) injection  90 mg Intravenous Q12H  . metoprolol tartrate  5 mg Intravenous Q12H  . ondansetron (ZOFRAN) IV  4 mg Intravenous Q6H  . pantoprazole  40 mg Oral BID AC  . polyethylene glycol  17 g Per Tube Daily  . sodium chloride flush  10-40 mL Intracatheter Q12H  . zinc sulfate  220 mg Oral Daily   Continuous Infusions: . propofol (DIPRIVAN) infusion    . TPN ADULT (ION) 100 mL/hr at 03/25/20 1854  . TPN ADULT (ION)       LOS: 20 days    Time spent: 35 minutes   Barton Dubois, MD [facility]  If 7PM-7AM, please contact night-coverage [email or web address] 03/26/2020, 1:19 PM

## 2020-03-26 NOTE — Progress Notes (Signed)
Patient has been on 15 liter NRB mask , has done well with just using mask.

## 2020-03-26 NOTE — ED Provider Notes (Signed)
Procedure Name: Intubation Date/Time: 03/26/2020 1:31 PM Performed by: Fredia Sorrow, MD Pre-anesthesia Checklist: Patient identified, Suction available, Emergency Drugs available, Patient being monitored and Timeout performed Oxygen Delivery Method: Ambu bag Preoxygenation: Pre-oxygenation with 100% oxygen Induction Type: Rapid sequence Laryngoscope Size: Glidescope and 4 Tube size: 7.5 mm Number of attempts: 1 Airway Equipment and Method: Rigid stylet Placement Confirmation: ETT inserted through vocal cords under direct vision,  Breath sounds checked- equal and bilateral and CO2 detector Secured at: 24 cm Tube secured with: ETT holder     Called to ICU for patient with Covid infection and respiratory distress already on BiPAP.  Sats still in the 70s with that.  Patient received etomidate 10 mg succinylcholine 100 mg while still sitting up.  Then we lowered him and intubated him immediately.  His oxygen sats remained in the 70s.  While intubation was ongoing.  No difficulties with intubation.  And then when hooked up to the ventilator had good breath sounds bilaterally and oxygen sats came up to 90%.   Fredia Sorrow, MD 03/26/20 [phone]

## 2020-03-26 NOTE — Progress Notes (Signed)
PHARMACY - TOTAL PARENTERAL NUTRITION CONSULT NOTE   Indication: Small bowel obstruction- resolved/ Unable to feed by NGT .   Patient Measurements: Height: 6\' 6"  (198.1 cm) Weight: 89.3 kg (196 lb 13.9 oz) IBW/kg (Calculated) : 91.4 TPN AdjBW (KG): 108.9 Body mass index is 22.75 kg/m. Usual Weight: 108.9 kg on admission, 90kg now  Assessment:  Patient is a 60 yo male with history of Ulcerative colitis s/p ileal anastomosis /colectomy, gout, CAD, DM2 and HTN. He presents with small bowel obstruction and COVID-19 pneumonia and gastroenteritis/infection.  SBO improved and on regular diet but unable to tolerate. Abdomen still distended.  Patient difficult NGT placement due to previous nasal surgeries. Concern with possible refeeding syndrome. Start at lower rate today to ensure tolerates.   Glucose / Insulin: 230-279 71 units insulin. Will give 2/3 in TPN. BS this am improved but still 230 Electrolytes: K 4.7- WNL now but trending up Renal: stable LFTs / TGs: TG 188 Prealbumin / albumin: Alb 2.2 Intake / Output; MIVF: 1800 IV/ 1163mls out GI Imaging: SBO resolved, but unable to eat still since 1/26 Surgeries / Procedures: none this admission  Central access: 03/22/2020 TPN start date: 03/22/2020  Nutritional Goals (per RD recommendation on 1/31): kCal: 1245-8099, Protein: 140-167g Fluid: per MD 2430mls/day  Goal TPN rate is 100 mL/hr (provides 144 g of protein 1963 kcals per day, can increase when add lipids)   Current Nutrition:  Low residue diet, but not tolerating   Plan:  Continue TPN to 100 mL/hr at 1800 Holding lipids at this time due to shortage- F/U initiation if needed. Electrolytes in TPN: 19mEq/L of Na, 30 mEq/L of K, 53mEq/L of Ca, 80mEq/L of Mg, and 64mmol/L of Phos. Cl:Ac ratio 1:1 Add standard MVI and trace elements to TPN Add 50 units insulin to TPN Adjust to resistant q4h SSI and adjust as needed  Monitor TPN labs on Mon/Thurs  Isac Sarna, BS [place]ena [place],  [place] Clinical Pharmacist Pager [phone] 03/26/2020 8:59 AM

## 2020-03-26 NOTE — Progress Notes (Addendum)
[place] Progress Note Patient Name: Calvin Cruz DOB: December 29, 1960 MRN: [id number]   Date of Service  03/26/2020  HPI/Events of Note  PCCM admit note reviewed. Stable vitals on camera.   eICU Interventions  RN requested orders for OG tube and follow up KUB< will also get CXR since ETT was high earlier. IN addition, placed NPO and discontinued multiple fentanyl orders in duplicate. Please call us if needed.      Intervention Category Major Interventions: Respiratory failure - evaluation and management Evaluation Type: New Patient Evaluation  Margaretmary Lombard 03/26/2020, 8:28 PM   10:25 pm  I noted CXR ETT is in good position KUB pending  11 pm OG tube noted on KUB Place to LIS  12:10 am Intermittent hypotension, also has got some loose stool and sedation is dropping his BP  LR bolus , levophed if needed (current map is 56)  2 am Notified that patient almost self extubated and ETT was held by RN Current O2 sat is 95, patient comfortable now that sedation was increased MAP is 69 and levophed has not been started yet Fentanyl is at 150 mic/hour, versed at 3 mg/hour and airway pressures are decent on the vent Restraints ordered, stat CXR ordered to confirm position  D/w RN   3:15 am I reviewed X ray ETT looks ok O2 sat 96, Fio2 to be weaned  3:30 am previous 2 bladder scans each over 800cc, request for foley, order placed  4:20 am AM labs and ABG ordered

## 2020-03-27 ENCOUNTER — Inpatient Hospital Stay (HOSPITAL_COMMUNITY): Payer: Commercial Managed Care - PPO

## 2020-03-27 DIAGNOSIS — U071 COVID-19: Secondary | ICD-10-CM | POA: Diagnosis not present

## 2020-03-27 DIAGNOSIS — J9601 Acute respiratory failure with hypoxia: Secondary | ICD-10-CM | POA: Diagnosis not present

## 2020-03-27 DIAGNOSIS — K56600 Partial intestinal obstruction, unspecified as to cause: Secondary | ICD-10-CM | POA: Diagnosis not present

## 2020-03-27 LAB — BLOOD GAS, ARTERIAL
Acid-Base Excess: 4.1 mmol/L — ABNORMAL HIGH (ref 0.0–2.0)
Bicarbonate: 29.6 mmol/L — ABNORMAL HIGH (ref 20.0–28.0)
Drawn by: 59133
FIO2: 80
O2 Saturation: 95.4 %
PEEP: 5 cmH2O
Patient temperature: 98.6
RATE: 26 resp/min
pCO2 arterial: 49.8 mmHg — ABNORMAL HIGH (ref 32.0–48.0)
pH, Arterial: 7.391 (ref 7.350–7.450)
pO2, Arterial: 76.6 mmHg — ABNORMAL LOW (ref 83.0–108.0)

## 2020-03-27 LAB — CBC WITH DIFFERENTIAL/PLATELET
Abs Immature Granulocytes: 0.42 10*3/uL — ABNORMAL HIGH (ref 0.00–0.07)
Basophils Absolute: 0.1 10*3/uL (ref 0.0–0.1)
Basophils Relative: 0 %
Eosinophils Absolute: 0 10*3/uL (ref 0.0–0.5)
Eosinophils Relative: 0 %
HCT: 40 % (ref 39.0–52.0)
Hemoglobin: 14.4 g/dL (ref 13.0–17.0)
Immature Granulocytes: 2 %
Lymphocytes Relative: 4 %
Lymphs Abs: 1 10*3/uL (ref 0.7–4.0)
MCH: 31.6 pg (ref 26.0–34.0)
MCHC: 36 g/dL (ref 30.0–36.0)
MCV: 87.7 fL (ref 80.0–100.0)
Monocytes Absolute: 0.5 10*3/uL (ref 0.1–1.0)
Monocytes Relative: 2 %
Neutro Abs: 21.4 10*3/uL — ABNORMAL HIGH (ref 1.7–7.7)
Neutrophils Relative %: 92 %
Platelets: 294 10*3/uL (ref 150–400)
RBC: 4.56 MIL/uL (ref 4.22–5.81)
RDW: 11.9 % (ref 11.5–15.5)
WBC: 23.4 10*3/uL — ABNORMAL HIGH (ref 4.0–10.5)
nRBC: 0 % (ref 0.0–0.2)

## 2020-03-27 LAB — TRIGLYCERIDES: Triglycerides: 202 mg/dL — ABNORMAL HIGH (ref <150)

## 2020-03-27 LAB — GLUCOSE, CAPILLARY
Glucose-Capillary: 157 mg/dL — ABNORMAL HIGH (ref 70–99)
Glucose-Capillary: 281 mg/dL — ABNORMAL HIGH (ref 70–99)
Glucose-Capillary: 306 mg/dL — ABNORMAL HIGH (ref 70–99)
Glucose-Capillary: 277 mg/dL — ABNORMAL HIGH (ref 70–99)
Glucose-Capillary: 250 mg/dL — ABNORMAL HIGH (ref 70–99)

## 2020-03-27 LAB — BASIC METABOLIC PANEL
Anion gap: 10 (ref 5–15)
BUN: 20 mg/dL (ref 8–23)
CO2: 27 mmol/L (ref 22–32)
Calcium: 7.9 mg/dL — ABNORMAL LOW (ref 8.9–10.3)
Chloride: 94 mmol/L — ABNORMAL LOW (ref 98–111)
Creatinine, Ser: 0.33 mg/dL — ABNORMAL LOW (ref 0.61–1.24)
GFR, Estimated: >60 mL/min (ref >60)
Glucose, Bld: 164 mg/dL — ABNORMAL HIGH (ref 70–99)
Potassium: 4.6 mmol/L (ref 3.5–5.1)
Sodium: 131 mmol/L — ABNORMAL LOW (ref 135–145)

## 2020-03-27 LAB — MRSA PCR SCREENING: MRSA by PCR: NEGATIVE

## 2020-03-27 LAB — MAGNESIUM
Magnesium: 2 mg/dL (ref 1.7–2.4)
Magnesium: 1.9 mg/dL (ref 1.7–2.4)
Magnesium: 2.1 mg/dL (ref 1.7–2.4)

## 2020-03-27 LAB — PHOSPHORUS
Phosphorus: 2.9 mg/dL (ref 2.5–4.6)
Phosphorus: 2.5 mg/dL (ref 2.5–4.6)

## 2020-03-27 MED ORDER — IPRATROPIUM-ALBUTEROL 0.5-2.5 (3) MG/3ML IN SOLN
3.0000 mL | Freq: Three times a day (TID) | RESPIRATORY_TRACT | Status: DC
  Administered 2020-03-28 – 2020-04-14 (×54): 3 mL via RESPIRATORY_TRACT
  Filled 2020-03-27 (×53): qty 3

## 2020-03-27 MED ORDER — NOREPINEPHRINE 4 MG/250ML-% IV SOLN
0.0000 ug/min | INTRAVENOUS | Status: DC
  Administered 2020-03-27 – 2020-03-30 (×2): 2 ug/min via INTRAVENOUS
  Filled 2020-03-27 (×2): qty 250

## 2020-03-27 MED ORDER — ZINC SULFATE 220 (50 ZN) MG PO CAPS
220.0000 mg | ORAL_CAPSULE | Freq: Every day | ORAL | Status: DC
  Administered 2020-03-28 – 2020-04-03 (×7): 220 mg
  Filled 2020-03-27 (×7): qty 1

## 2020-03-27 MED ORDER — ACETAMINOPHEN 325 MG PO TABS
650.0000 mg | ORAL_TABLET | Freq: Four times a day (QID) | ORAL | Status: DC | PRN
  Administered 2020-04-04: 650 mg
  Filled 2020-03-27: qty 2

## 2020-03-27 MED ORDER — PANTOPRAZOLE SODIUM 40 MG PO PACK
40.0000 mg | PACK | Freq: Two times a day (BID) | ORAL | Status: DC
  Administered 2020-03-27 – 2020-04-14 (×37): 40 mg
  Filled 2020-03-27 (×37): qty 20

## 2020-03-27 MED ORDER — BARICITINIB 2 MG PO TABS: 4.0000 mg | ORAL_TABLET | Freq: Every day | ORAL | Status: DC

## 2020-03-27 MED ORDER — FENTANYL BOLUS VIA INFUSION
25.0000 ug | INTRAVENOUS | Status: DC | PRN
  Administered 2020-03-27: 100 ug via INTRAVENOUS
  Administered 2020-03-27: 75 ug via INTRAVENOUS
  Administered 2020-03-27: 100 ug via INTRAVENOUS
  Administered 2020-03-27: 25 ug via INTRAVENOUS
  Administered 2020-03-27: 50 ug via INTRAVENOUS
  Administered 2020-03-28: 100 ug via INTRAVENOUS
  Administered 2020-03-28: 75 ug via INTRAVENOUS
  Administered 2020-03-28 (×4): 100 ug via INTRAVENOUS
  Administered 2020-03-28: 50 ug via INTRAVENOUS
  Administered 2020-03-29: 75 ug via INTRAVENOUS
  Administered 2020-03-29 (×3): 100 ug via INTRAVENOUS
  Administered 2020-03-29: 50 ug via INTRAVENOUS
  Administered 2020-03-29 – 2020-03-31 (×11): 100 ug via INTRAVENOUS
  Administered 2020-03-31 (×2): 50 ug via INTRAVENOUS
  Administered 2020-03-31 (×2): 100 ug via INTRAVENOUS
  Administered 2020-03-31: 50 ug via INTRAVENOUS
  Administered 2020-03-31 (×8): 100 ug via INTRAVENOUS
  Administered 2020-03-31: 50 ug via INTRAVENOUS
  Administered 2020-03-31 (×2): 100 ug via INTRAVENOUS
  Administered 2020-04-01: 50 ug via INTRAVENOUS
  Administered 2020-04-01 (×5): 100 ug via INTRAVENOUS
  Administered 2020-04-01: 25 ug via INTRAVENOUS
  Administered 2020-04-01: 75 ug via INTRAVENOUS
  Administered 2020-04-01 – 2020-04-02 (×6): 100 ug via INTRAVENOUS
  Administered 2020-04-02: 50 ug via INTRAVENOUS
  Administered 2020-04-04 (×2): 100 ug via INTRAVENOUS
  Administered 2020-04-05: 50 ug via INTRAVENOUS
  Administered 2020-04-06 – 2020-04-07 (×3): 100 ug via INTRAVENOUS
  Administered 2020-04-07 (×2): 25 ug via INTRAVENOUS
  Administered 2020-04-09: 50 ug via INTRAVENOUS
  Administered 2020-04-10 – 2020-04-11 (×3): 75 ug via INTRAVENOUS
  Administered 2020-04-11: 25 ug via INTRAVENOUS
  Administered 2020-04-11 (×2): 100 ug via INTRAVENOUS
  Filled 2020-03-27: qty 100

## 2020-03-27 MED ORDER — ASCORBIC ACID 500 MG PO TABS
1000.0000 mg | ORAL_TABLET | Freq: Every day | ORAL | Status: DC
  Administered 2020-03-28 – 2020-04-05 (×9): 1000 mg
  Filled 2020-03-27 (×9): qty 2

## 2020-03-27 MED ORDER — PROSOURCE TF PO LIQD
45.0000 mL | Freq: Two times a day (BID) | ORAL | Status: DC
  Administered 2020-03-27 – 2020-03-28 (×3): 45 mL
  Filled 2020-03-27 (×3): qty 45

## 2020-03-27 MED ORDER — ENOXAPARIN SODIUM 40 MG/0.4ML ~~LOC~~ SOLN
40.0000 mg | SUBCUTANEOUS | Status: DC
  Administered 2020-03-27 – 2020-04-05 (×10): 40 mg via SUBCUTANEOUS
  Filled 2020-03-27 (×10): qty 0.4

## 2020-03-27 MED ORDER — VITAMIN D 25 MCG (1000 UNIT) PO TABS
5000.0000 [IU] | ORAL_TABLET | Freq: Every day | ORAL | Status: DC
  Administered 2020-03-28 – 2020-04-14 (×18): 5000 [IU]
  Filled 2020-03-27 (×18): qty 5

## 2020-03-27 MED ORDER — LACTATED RINGERS IV BOLUS
500.0000 mL | Freq: Once | INTRAVENOUS | Status: AC
  Administered 2020-03-27: 500 mL via INTRAVENOUS

## 2020-03-27 MED ORDER — VITAL HIGH PROTEIN PO LIQD
1000.0000 mL | ORAL | Status: AC
  Administered 2020-03-27 – 2020-03-28 (×2): 1000 mL

## 2020-03-27 NOTE — Progress Notes (Signed)
Pharmacy: TPN  TPN not hung until ~ 04 am.  Plan: continue current bag until it's completed, 2/7 ~ 04 am - then DC TPN completely per d/w CCM. Start TFs today and advance to goal per RD Pharmacy to sign off. TPN labs dc'd  Eudelia Bunch, Pharm.D 03/27/2020 10:18 AM

## 2020-03-27 NOTE — Progress Notes (Signed)
NAME:  Calvin Cruz, MRN:  [id number], DOB:  05-27-1960, LOS: 21 ADMISSION DATE:  02/25/2020, CONSULTATION DATE:  03/21/2020 REFERRING MD:  Dr. Manuella Ghazi, [facility], CHIEF COMPLAINT:  Short of breath   Brief History:  60 yo male former smoker presented to ER on 03/13/2020 with nausea, vomiting and diarrhea.  He has not received COVID vaccination.  He was found to have COVID 19 infection and partial SBO.  He was seen by GI and surgery.  He had endoscopic insertion of NG tube.  He had persistent fever and increasing O2 needs, and started on remdesivir and steroids for COVID 19 pneumonia.  Transferred to ICU.  Started on baricitinib.  PCCM asked to assist with respiratory management.  Past Medical History:  Ulcerative colitis s/p colectomy, Gout, CAD s/p DES, HTN, HLD, Nephrolithiasis, DM, Erythrocytosis  Significant Hospital Events:  1/15 Admit 1/18 Endoscopic insertion of NG tube 1/20 start remdesivir, steroids 1/24 needing high flow oxygen and NRB 2/01 start TPN 03/27/2020: Stop TPN, start tube feeds he has an OG tube  Consults:  Gastroenterology s/o 1/28 Surgery s/o 1/19  Procedures:  Rt PICC 2/01 >>   Significant Diagnostic Tests:   CT abd/pelvis 1/16 >> lower lungs clear, mildly dilated air and fluid-filled loops of ileum measuring up to 3.2 cm. No clear transition point or focal wall narrowing  CT angio chest 1/28 >> diffuse b/l airspace opacity consistent with COVID 19 pneumonia  Micro Data:  COVID PCR 1/15 >> Positive Flu PCR 1/15 >> negative  Antimicrobials:  Remdesivir 1/20 >> 1/24 Steroids 1/20 >>  Baricitinib 1/27 >>   Interim History / Subjective:   Patient remains intubated mechanical life support critically ill.  Objective   Blood pressure (!) 86/62, pulse 100, temperature 98.78 F (37.1 C), resp. rate (!) 27, height 6\' 6"  (1.981 m), weight 89.3 kg, SpO2 97 %.    Vent Mode: PCV FiO2 (%):  [80 %-100 %] 80 % Set Rate:  [26 bmp] 26 bmp PEEP:  [5 cmH20-10 cmH20] 5  cmH20 Plateau Pressure:  [16 cmH20-39 cmH20] 16 cmH20   Intake/Output Summary (Last 24 hours) at 03/27/2020 0805 Last data filed at 03/27/2020 0606 Gross per 24 hour  Intake 2376.14 ml  Output 775 ml  Net 1601.14 ml   Filed Weights   03/21/20 0500 03/22/20 0415 03/23/20 0335  Weight: 93 kg 90.2 kg 89.3 kg    Examination:  General -elderly male intubated mechanical life support critically ill Eyes -pupils pinpoint, no response to pain ENT -endotracheal tube in place Cardiac -rate rhythm, S1-S2 Lungs: Bilateral mechanically ventilated breath sounds Abdomen -soft nontender nondistended Extremities -no significant edema Skin -no rash Neuro -sedated on mechanical life support  Chest x-ray 03/27/2020: ET tube appropriate position bilateral infiltrates slightly worse The patient's images have been independently reviewed by me.     Resolved Hospital Problem list     Assessment & Plan:   Acute hypoxic respiratory failure from COVID 19 pneumonia. Possible aspiration event requiring intubation while on BiPAP, now on mechanical ventilation Plan: Has completed course of remdesivir Continue Solu-Medrol Complete course of baricitinib Adult mechanical vent protocol BAP prophylaxis PAD guidelines Complete 7 days Unasyn Follow-up trach aspirate  SBO/ileus, resolved History of ulcerative colitis. Presented also with gastroenteritis and increased stool volumes on presentation -Patient now has an OG tube -Stop TNA, start tube feeds and uptitrate -Consult to RD to meet needs. -If he needs a NG tube for feedings in the future we will have interventional radiology place  a postpyloric core track if necessary. -I do not see a indication for continuation of TPN as he has partial functioning gut and he is making stool.  So he is no longer obstructed  CAD s/p DES, HTN, HLD. Hyponatremia.  DM type 2 poorly controlled with steroid induced hyperglycemia. Hyper glycemia secondary to  steroids COVID glycemic protocol Goal CBG 140-180 Likely need to increase coverage as we start tube feeds  Best practice (evaluated daily)  Diet: regular diet/TPN DVT prophylaxis: lovenox GI prophylaxis: protonix Mobility: OOB to chair Disposition: ICU Family: Per primary team Code Status: ICU  Labs    CMP Latest Ref Rng & Units 03/27/2020 03/26/2020 03/25/2020  Glucose 70 - 99 mg/dL 164(H) 230(H) 270(H)  BUN 8 - 23 mg/dL 20 22 17   Creatinine 0.61 - 1.24 mg/dL 0.33(L) 0.43(L) 0.36(L)  Sodium 135 - 145 mmol/L 131(L) 129(L) 129(L)  Potassium 3.5 - 5.1 mmol/L 4.6 4.7 4.5  Chloride 98 - 111 mmol/L 94(L) 93(L) 92(L)  CO2 22 - 32 mmol/L 27 27 28   Calcium 8.9 - 10.3 mg/dL 7.9(L) 8.5(L) 8.5(L)  Total Protein 6.5 - 8.1 g/dL - 6.5 6.0(L)  Total Bilirubin 0.3 - 1.2 mg/dL - 0.8 0.9  Alkaline Phos 38 - 126 U/L - 57 51  AST 15 - 41 U/L - 20 18  ALT 0 - 44 U/L - 24 18    CBC Latest Ref Rng & Units 03/27/2020 03/26/2020 03/26/2020  WBC 4.0 - 10.5 K/uL 23.4(H) 19.8(H) 19.7(H)  Hemoglobin 13.0 - 17.0 g/dL 14.4 14.0 16.1  Hematocrit 39.0 - 52.0 % 40.0 39.1 45.8  Platelets 150 - 400 K/uL 294 238 295    ABG    Component Value Date/Time   PHART 7.391 03/27/2020 0455   PCO2ART 49.8 (H) 03/27/2020 0455   PO2ART 76.6 (L) 03/27/2020 0455   HCO3 29.6 (H) 03/27/2020 0455   O2SAT 95.4 03/27/2020 0455    CBG (last 3)  Recent Labs    03/26/20 2307 03/27/20 0319 03/27/20 0733  GLUCAP 104* 157* 281*     This patient is critically ill with multiple organ system failure; which, requires frequent high complexity decision making, assessment, support, evaluation, and titration of therapies. This was completed through the application of advanced monitoring technologies and extensive interpretation of multiple databases. During this encounter critical care time was devoted to patient care services described in this note for 32 minutes.  [facility] 03/27/2020 8:05 AM

## 2020-03-28 DIAGNOSIS — K56600 Partial intestinal obstruction, unspecified as to cause: Secondary | ICD-10-CM | POA: Diagnosis not present

## 2020-03-28 DIAGNOSIS — L8990 Pressure ulcer of unspecified site, unspecified stage: Secondary | ICD-10-CM | POA: Insufficient documentation

## 2020-03-28 LAB — PHOSPHORUS
Phosphorus: 2.2 mg/dL — ABNORMAL LOW (ref 2.5–4.6)
Phosphorus: 2.4 mg/dL — ABNORMAL LOW (ref 2.5–4.6)

## 2020-03-28 LAB — MAGNESIUM
Magnesium: 1.9 mg/dL (ref 1.7–2.4)
Magnesium: 1.9 mg/dL (ref 1.7–2.4)

## 2020-03-28 LAB — GLUCOSE, CAPILLARY
Glucose-Capillary: 148 mg/dL — ABNORMAL HIGH (ref 70–99)
Glucose-Capillary: 153 mg/dL — ABNORMAL HIGH (ref 70–99)
Glucose-Capillary: 179 mg/dL — ABNORMAL HIGH (ref 70–99)
Glucose-Capillary: 207 mg/dL — ABNORMAL HIGH (ref 70–99)
Glucose-Capillary: 222 mg/dL — ABNORMAL HIGH (ref 70–99)
Glucose-Capillary: 223 mg/dL — ABNORMAL HIGH (ref 70–99)

## 2020-03-28 LAB — BASIC METABOLIC PANEL
Anion gap: 10 (ref 5–15)
BUN: 19 mg/dL (ref 8–23)
CO2: 29 mmol/L (ref 22–32)
Calcium: 7.6 mg/dL — ABNORMAL LOW (ref 8.9–10.3)
Chloride: 94 mmol/L — ABNORMAL LOW (ref 98–111)
Creatinine, Ser: 0.38 mg/dL — ABNORMAL LOW (ref 0.61–1.24)
GFR, Estimated: >60 mL/min (ref >60)
Glucose, Bld: 149 mg/dL — ABNORMAL HIGH (ref 70–99)
Potassium: 4.5 mmol/L (ref 3.5–5.1)
Sodium: 133 mmol/L — ABNORMAL LOW (ref 135–145)

## 2020-03-28 MED ORDER — K PHOS MONO-SOD PHOS DI & MONO 155-852-130 MG PO TABS
250.0000 mg | ORAL_TABLET | Freq: Three times a day (TID) | ORAL | Status: AC
  Administered 2020-03-28 (×2): 250 mg
  Filled 2020-03-28 (×3): qty 1

## 2020-03-28 MED ORDER — MAGNESIUM SULFATE IN D5W 1-5 GM/100ML-% IV SOLN
1.0000 g | Freq: Once | INTRAVENOUS | Status: AC
  Administered 2020-03-28: 1 g via INTRAVENOUS
  Filled 2020-03-28: qty 100

## 2020-03-28 MED ORDER — ALBUMIN HUMAN 5 % IV SOLN
12.5000 g | Freq: Once | INTRAVENOUS | Status: AC
  Administered 2020-03-28: 12.5 g via INTRAVENOUS
  Filled 2020-03-28: qty 250

## 2020-03-28 MED ORDER — MAGNESIUM SULFATE 2 GM/50ML IV SOLN
2.0000 g | Freq: Once | INTRAVENOUS | Status: AC
  Administered 2020-03-28: 2 g via INTRAVENOUS
  Filled 2020-03-28: qty 50

## 2020-03-28 MED ORDER — VITAL 1.5 CAL PO LIQD
1000.0000 mL | ORAL | Status: DC
  Administered 2020-03-28 – 2020-04-05 (×6): 1000 mL
  Filled 2020-03-28 (×11): qty 1000

## 2020-03-28 MED ORDER — PROSOURCE TF PO LIQD
90.0000 mL | Freq: Three times a day (TID) | ORAL | Status: DC
  Administered 2020-03-28 – 2020-04-04 (×23): 90 mL
  Filled 2020-03-28 (×23): qty 90

## 2020-03-28 MED ORDER — K PHOS MONO-SOD PHOS DI & MONO 155-852-130 MG PO TABS
250.0000 mg | ORAL_TABLET | Freq: Three times a day (TID) | ORAL | Status: DC
  Administered 2020-03-28: 250 mg via ORAL
  Filled 2020-03-28: qty 1

## 2020-03-28 NOTE — Progress Notes (Signed)
Magnesium replaced per protocol, Phosphorus was replaced under elink/cone replacement with the supervision of Dr. Jeannene Patella

## 2020-03-28 NOTE — Progress Notes (Signed)
NAME:  Calvin Cruz, MRN:  [id number], DOB:  13-May-1960, LOS: 15 ADMISSION DATE:  2020/04/01, CONSULTATION DATE:  03/21/2020 REFERRING MD:  Dr. Manuella Ghazi, [facility], CHIEF COMPLAINT:  Short of breath   Brief History:  60 year old with ARDS secondary to COVID-19 pneumonia, small bowel obstruction/ileus Intubated on 2/5 after possible aspiration event on BiPAP and transferred to [facility] hospital  Past Medical History:  Ulcerative colitis s/p colectomy, Gout, CAD s/p DES, HTN, HLD, Nephrolithiasis, DM, Erythrocytosis  Significant Hospital Events:  1/15 Admit to [facility] 1/18 Endoscopic insertion of NG tube 1/20 start remdesivir, steroids 1/24 needing high flow oxygen and NRB 2/01 start TPN 2/5 Intubated and tx to [facility] 2/6 Stop TPN, start tube feeds he has an OG tube  Consults:  Gastroenterology s/o 1/28 Surgery s/o 1/19  Procedures:  Rt PICC 2/01 >>   Significant Diagnostic Tests:   CT abd/pelvis 1/16 >> lower lungs clear, mildly dilated air and fluid-filled loops of ileum measuring up to 3.2 cm. No clear transition point or focal wall narrowing  CT angio chest 1/28 >> diffuse b/l airspace opacity consistent with COVID 19 pneumonia  Micro Data:  COVID PCR 1/15 >> Positive Flu PCR 1/15 >> negative Urine culture 2/2-pansensitive E. coli  Antimicrobials:  Remdesivir 1/20 >> 1/24 Steroids 1/20 >>  Baricitinib 1/27 >> 2/6  Unasyn 2/5 >>  Interim History / Subjective:   Continues on the vent.  Given albumin overnight for tachycardia  Objective   Blood pressure 100/60, pulse (!) 115, temperature 98.78 F (37.1 C), temperature source Bladder, resp. rate (!) 27, height 6\' 6"  (1.981 m), weight 87.7 kg, SpO2 (!) 88 %. CVP:  [5 mmHg] 5 mmHg  Vent Mode: PCV FiO2 (%):  [50 %-70 %] 50 % Set Rate:  [26 bmp] 26 bmp PEEP:  [10 cmH20] 10 cmH20 Plateau Pressure:  [27 cmH20-28 cmH20] 27 cmH20   Intake/Output Summary (Last 24 hours) at 03/28/2020 0806 Last data filed at 03/28/2020  0758 Gross per 24 hour  Intake 3576.59 ml  Output 3150 ml  Net 426.59 ml   Filed Weights   03/22/20 0415 03/23/20 0335 03/28/20 0504  Weight: 90.2 kg 89.3 kg 87.7 kg    Examination: Gen:      No acute distress HEENT:  EOMI, sclera anicteric Neck:     No masses; no thyromegaly, ETT Lungs:    Clear to auscultation bilaterally; normal respiratory effort CV:         Regular rate and rhythm; no murmurs Abd:      + bowel sounds; soft, non-tender; no palpable masses, no distension Ext:    No edema; adequate peripheral perfusion Skin:      Warm and dry; no rash Neuro: Sedated, unresponsive  Labs/imaging personally reviewed Significant for Sodium 133, BUN/creatinine 19/0.38 23.4, hemoglobin 14.4 No new imaging  Resolved Hospital Problem list     Assessment & Plan:   Acute hypoxic respiratory failure from COVID 19 pneumonia. Possible aspiration event while on BiPAP with worsening requiring intubation Possible aspiration event requiring intubation while on BiPAP, now on mechanical ventilation Plan: Has finished remdesivir, baricitinib Continue steroids Unasyn for 7 days Follow intermittent chest x-ray Wean down PEEP/FiO2 as tolerated  SBO/ileus, resolved History of ulcerative colitis. Presented also with gastroenteritis and increased stool volumes on presentation Started on tube feeds.  He has been tolerating it well so far TPN stopped  CAD s/p DES, HTN, HLD. Hyponatremia. Labs  DM type 2 poorly controlled with steroid induced hyperglycemia. Hyper glycemia  secondary to steroids COVID glycemic protocol Goal CBG 140-180 Continue Lantus, SSI   Best practice (evaluated daily)  Diet: regular diet/TPN DVT prophylaxis: lovenox GI prophylaxis: protonix Mobility: OOB to chair Disposition: ICU Family: Per primary team Code Status: ICU  Critical care time:   The patient is critically ill with multiple organ system failure and requires high complexity decision making  for assessment and support, frequent evaluation and titration of therapies, advanced monitoring, review of radiographic studies and interpretation of complex data.   Critical Care Time devoted to patient care services, exclusive of separately billable procedures, described in this note is 35 minutes.   Marshell Garfinkel MD [facility] See Amion for pager  If no response to pager , please call [phone] until 7pm After 7:00 pm call Elink  [phone] 03/28/2020, 8:32 AM

## 2020-03-28 NOTE — Progress Notes (Signed)
Nutrition Follow-up  DOCUMENTATION CODES:   Not applicable  INTERVENTION:  - will adjust TF regimen: Vital 1.5 @ 25 ml/hr to advance by 10 ml every 8 hours to reach goal rate of 55 ml/hr with 90 ml Prosource TF TID.  - at goal rate, this regimen will provide 2220 kcal, 155 grams protein, and 1008 ml free water.  - free water flush, if desired, to be per CCM.  NUTRITION DIAGNOSIS:   Inadequate oral intake related to inability to eat as evidenced by NPO status. -revised  GOAL:   Patient will meet greater than or equal to 90% of their needs -to be met with TF regimen  MONITOR:   Vent status,TF tolerance,Labs,Weight trends,Skin  REASON FOR ASSESSMENT:   Ventilator,Consult Enteral/tube feeding initiation and management  ASSESSMENT:   Patient is a 60 yo male with history of Ulcerative colitis s/p ileal anastomosis /colectomy, gout, CAD, DM2 and HTN. He presents with small bowel obstruction and COVID-19 pneumonia and gastroenteritis/infection.  Significant Events: 1/15- admission 1/18- endoscopic insertion of NGT 1/19- Surgery signed off 1/31- diet advanced from NPO to Low Residue 2/3- Soft diet 2/1- TPN initiation 2/5- intubation; OGT placement 2/6- TPN stopped; TF initiation   Patient remains intubated with OGT in place. He is receiving TF per protocol: Vital High Protein @ 40 ml/hr with 45 ml Prosource TF BID.   Patient's wife is at bedside and provides all information. Patient had extensive abdominal/colonic surgeries in the past 2/2 ulcerative colitis. Due to this, patient consumes a low residue diet although he still experiences SBOs which have resulted in several hospitalizations, including current hospitalization.   Patient works in [employer] and works 13 hour shifts.   Weight has been trending down since admission. No edema noted during NFPE.   Per notes: - post-COVID - SBO/ileus--resolved - hx of UC - poorly controlled type 2 DM   Patient is currently  intubated on ventilator support MV: 14.3 L/min Temp (24hrs), Avg:98.9 F (37.2 C), Min:98.42 F (36.9 C), Max:99.5 F (37.5 C) Propofol: none BP: 115/56 and MAP: 74  Labs reviewed; CBGs: 222, 153, 179, 148 mg/dl, Na: 133 mmol/l, Cl: 94 mmol/l, creatinine: 0.38 mg/dl, Ca: 7.6 mg/dl, Phos: 2.2 mg/dl. Medications reviewed; 1000 mg ascorbic acid/day, 5000 units cholecalciferol/day, 100 mg colace BID, sliding scale novolog, 18 units lantus BID, 2 g IV Mg sulfate x1 run 2/7, 90 mg solu-medrol BID, 40 mg protonix BID, 250 mg KPhos neutral x2 doses 2/7, 17 g miralax/day, 220 mg zinc sulfate/day.  Drip; versed @ 8 mg/hr.    NUTRITION - FOCUSED PHYSICAL EXAM:  completed; no muscle or fat depletions noted.   Diet Order:   Diet Order            Diet NPO time specified  Diet effective now                 EDUCATION NEEDS:   Not appropriate for education at this time  Skin:  Skin Assessment: Skin Integrity Issues: Skin Integrity Issues:: Stage I Stage I: sacrum  Last BM:  2/1-type 7  Height:   Ht Readings from Last 1 Encounters:  03/24/20 6' 6" (1.981 m)    Weight:   Wt Readings from Last 1 Encounters:  03/28/20 87.7 kg     Estimated Nutritional Needs:  Kcal:  2240 kcal Protein:  150-170 grams Fluid:  >/= 2.5 L/day      Jarome Matin, MS, RD, LDN, CNSC Inpatient Clinical Dietitian RD pager # available in [facility]  After hours/weekend pager # available in [facility]

## 2020-03-28 NOTE — Progress Notes (Signed)
PHYSICAL THERAPY  Pt is a transfer from [facility] seen by Physical Therapy but currently on vent and unable to tolerate Physical Therapy.  Will ask our Rehab Director to review.  May need to D/C from case load and re order when appropriate.  Rica Koyanagi  PTA Acute  Rehabilitation Services Pager      [phone] Office      [phone]

## 2020-03-28 NOTE — Progress Notes (Signed)
[place] Progress Note Patient Name: Calvin Cruz DOB: 10-09-1960 MRN: [id number]   Date of Service  03/28/2020  HPI/Events of Note  Multiple issues: 1. Agitation - Request to renew bilateral soft wrist restraints. 2. Hypomagnesemia - Mg++ = 1.9 and Creatinine = 0.38.   eICU Interventions  Plan: 1. Renew bilateral soft wrist restraint orders x 12 hours. 2. Replace Mg++.      Intervention Category Major Interventions: Delirium, psychosis, severe agitation - evaluation and management;Electrolyte abnormality - evaluation and management  Paizlie Klaus [place] 03/28/2020, 9:01 PM

## 2020-03-28 NOTE — TOC Progression Note (Signed)
Transition of Care [facility] - Progression Note    Patient Details  Name: Calvin Cruz MRN: [id number] Date of Birth: [birth year]  Transition of Care [place]) CM/SW Contact  Leeroy Cha, RN Phone Number: 03/28/2020, 9:52 AM  Clinical Narrative:    60 year old with ARDS secondary to COVID-19 pneumonia, small bowel obstruction/ileus Intubated on 2/5 after possible aspiration event on BiPAP and transferred to [facility] long hospital  Past Medical History:  Ulcerative colitis s/p colectomy, Gout, CAD s/p DES, HTN, HLD, Nephrolithiasis, DM, Erythrocytosis  Significant Hospital Events:  1/15 Admit to [facility] 1/18 Endoscopic insertion of NG tube 1/20 start remdesivir, steroids 1/24 needing high flow oxygen and NRB 2/01 start TPN 2/5 Intubated and tx to [facility] 2/6 Stop TPN, start tube feeds he has an OG tube PLAN: patient to unstable and on vent at this time.     Barriers to Discharge: Continued Medical Work up  Expected Discharge Plan and Services                                                 Social Determinants of Health (SDOH) Interventions    Readmission Risk Interventions No flowsheet data found.

## 2020-03-28 NOTE — Progress Notes (Signed)
[place] Progress Note Patient Name: JERAMIE SCOGIN DOB: 07/18/1960 MRN: [id number]   Date of Service  03/28/2020  HPI/Events of Note  Mild tachycardia 118-120 / BP 98/62. Is on fentanyl and versed drips. Has had liquid stool for days although not in the last few hours. TPNwas stopped. CVP is 5  eICU Interventions  Albumin x 1     Intervention Category Major Interventions: Respiratory failure - evaluation and management  Sandrina Heaton G Sahvanna Mcmanigal 03/28/2020, 1:23 AM

## 2020-03-29 ENCOUNTER — Inpatient Hospital Stay (HOSPITAL_COMMUNITY): Payer: Commercial Managed Care - PPO

## 2020-03-29 DIAGNOSIS — K56600 Partial intestinal obstruction, unspecified as to cause: Secondary | ICD-10-CM | POA: Diagnosis not present

## 2020-03-29 LAB — GLUCOSE, CAPILLARY
Glucose-Capillary: 201 mg/dL — ABNORMAL HIGH (ref 70–99)
Glucose-Capillary: 201 mg/dL — ABNORMAL HIGH (ref 70–99)
Glucose-Capillary: 232 mg/dL — ABNORMAL HIGH (ref 70–99)
Glucose-Capillary: 237 mg/dL — ABNORMAL HIGH (ref 70–99)
Glucose-Capillary: 251 mg/dL — ABNORMAL HIGH (ref 70–99)
Glucose-Capillary: 264 mg/dL — ABNORMAL HIGH (ref 70–99)
Glucose-Capillary: 300 mg/dL — ABNORMAL HIGH (ref 70–99)

## 2020-03-29 LAB — CBC
HCT: 36.1 % — ABNORMAL LOW (ref 39.0–52.0)
Hemoglobin: 12.5 g/dL — ABNORMAL LOW (ref 13.0–17.0)
MCH: 31.4 pg (ref 26.0–34.0)
MCHC: 34.6 g/dL (ref 30.0–36.0)
MCV: 90.7 fL (ref 80.0–100.0)
Platelets: 186 10*3/uL (ref 150–400)
RBC: 3.98 MIL/uL — ABNORMAL LOW (ref 4.22–5.81)
RDW: 11.9 % (ref 11.5–15.5)
WBC: 14.1 10*3/uL — ABNORMAL HIGH (ref 4.0–10.5)
nRBC: 0 % (ref 0.0–0.2)

## 2020-03-29 LAB — BASIC METABOLIC PANEL
Anion gap: 9 (ref 5–15)
BUN: 19 mg/dL (ref 8–23)
CO2: 33 mmol/L — ABNORMAL HIGH (ref 22–32)
Calcium: 8 mg/dL — ABNORMAL LOW (ref 8.9–10.3)
Chloride: 90 mmol/L — ABNORMAL LOW (ref 98–111)
Creatinine, Ser: 0.42 mg/dL — ABNORMAL LOW (ref 0.61–1.24)
GFR, Estimated: >60 mL/min (ref >60)
Glucose, Bld: 210 mg/dL — ABNORMAL HIGH (ref 70–99)
Potassium: 4.5 mmol/L (ref 3.5–5.1)
Sodium: 132 mmol/L — ABNORMAL LOW (ref 135–145)

## 2020-03-29 LAB — PHOSPHORUS: Phosphorus: 2.5 mg/dL (ref 2.5–4.6)

## 2020-03-29 LAB — MAGNESIUM: Magnesium: 2.3 mg/dL (ref 1.7–2.4)

## 2020-03-29 MED ORDER — DEXMEDETOMIDINE HCL IN NACL 200 MCG/50ML IV SOLN
0.4000 ug/kg/h | INTRAVENOUS | Status: DC
  Administered 2020-03-29: 1 ug/kg/h via INTRAVENOUS
  Administered 2020-03-29: 0.4 ug/kg/h via INTRAVENOUS
  Administered 2020-03-29: 1.2 ug/kg/h via INTRAVENOUS
  Administered 2020-03-29: 0.8 ug/kg/h via INTRAVENOUS
  Administered 2020-03-29: 1.2 ug/kg/h via INTRAVENOUS
  Administered 2020-03-29: 1.1 ug/kg/h via INTRAVENOUS
  Administered 2020-03-30: 1 ug/kg/h via INTRAVENOUS
  Administered 2020-03-30: 1.3 ug/kg/h via INTRAVENOUS
  Administered 2020-03-30: 06:00:00 0.9 ug/kg/h via INTRAVENOUS
  Administered 2020-03-30: 1.3 ug/kg/h via INTRAVENOUS
  Administered 2020-03-30: 02:00:00 1.2 ug/kg/h via INTRAVENOUS
  Administered 2020-03-30 (×2): 0.9 ug/kg/h via INTRAVENOUS
  Administered 2020-03-30: 23:00:00 1.4 ug/kg/h via INTRAVENOUS
  Administered 2020-03-30: 1.3 ug/kg/h via INTRAVENOUS
  Administered 2020-03-30: 1.2 ug/kg/h via INTRAVENOUS
  Administered 2020-03-30: 17:00:00 1.1 ug/kg/h via INTRAVENOUS
  Administered 2020-03-31: 1.5 ug/kg/h via INTRAVENOUS
  Administered 2020-03-31 (×3): 1.4 ug/kg/h via INTRAVENOUS
  Filled 2020-03-29: qty 100
  Filled 2020-03-29: qty 50
  Filled 2020-03-29: qty 100
  Filled 2020-03-29: qty 50
  Filled 2020-03-29 (×2): qty 100
  Filled 2020-03-29 (×2): qty 50
  Filled 2020-03-29: qty 100
  Filled 2020-03-29 (×8): qty 50

## 2020-03-29 MED ORDER — INSULIN GLARGINE 100 UNIT/ML ~~LOC~~ SOLN
22.0000 [IU] | Freq: Two times a day (BID) | SUBCUTANEOUS | Status: DC
  Administered 2020-03-29 – 2020-04-01 (×6): 22 [IU] via SUBCUTANEOUS
  Filled 2020-03-29 (×6): qty 0.22

## 2020-03-29 MED ORDER — METHYLPREDNISOLONE SODIUM SUCC 125 MG IJ SOLR
60.0000 mg | Freq: Two times a day (BID) | INTRAMUSCULAR | Status: DC
  Administered 2020-03-29: 60 mg via INTRAVENOUS
  Filled 2020-03-29: qty 2

## 2020-03-29 MED ORDER — FUROSEMIDE 10 MG/ML IJ SOLN
40.0000 mg | Freq: Once | INTRAMUSCULAR | Status: AC
  Administered 2020-03-29: 40 mg via INTRAVENOUS
  Filled 2020-03-29: qty 4

## 2020-03-29 NOTE — Progress Notes (Signed)
[place] Progress Note Patient Name: LIMMIE SCHOENBERG DOB: 02/02/1961 MRN: [id number]   Date of Service  03/29/2020  HPI/Events of Note  EKG done d/t monitor alarming for ST elevation reveals only sinus tachycardia.   eICU Interventions  Continue present management.      Intervention Category Major Interventions: Other:  Lysle Dingwall 03/29/2020, 11:07 PM

## 2020-03-29 NOTE — Progress Notes (Signed)
NAME:  Calvin Cruz, MRN:  [id number], DOB:  1960-12-04, LOS: 65 ADMISSION DATE:  02/20/2020, CONSULTATION DATE:  03/21/2020 REFERRING MD:  Dr. Manuella Ghazi, [facility], CHIEF COMPLAINT:  Short of breath   Brief History:  60 year old with ARDS secondary to COVID-19 pneumonia, small bowel obstruction/ileus Intubated on 2/5 after possible aspiration event on BiPAP and transferred to [facility]  Past Medical History:  Ulcerative colitis s/p colectomy, Gout, CAD s/p DES, HTN, HLD, Nephrolithiasis, DM, Erythrocytosis  Significant Hospital Events:  1/15 Admit to [facility] 1/18 Endoscopic insertion of NG tube 1/20 start remdesivir, steroids 1/24 needing high flow oxygen and NRB 2/01 start TPN 2/5 Intubated and tx to [facility] 2/6 Stop TPN, start tube feeds he has an OG tube  Consults:  Gastroenterology s/o 1/28 Surgery s/o 1/19  Procedures:  Rt PICC 2/01 >>   Significant Diagnostic Tests:   CT abd/pelvis 1/16 >> lower lungs clear, mildly dilated air and fluid-filled loops of ileum measuring up to 3.2 cm. No clear transition point or focal wall narrowing  CT angio chest 1/28 >> diffuse b/l airspace opacity consistent with COVID 19 pneumonia  Micro Data:  COVID PCR 1/15 >> Positive Flu PCR 1/15 >> negative Urine culture 2/2-pansensitive E. coli  Antimicrobials:  Remdesivir 1/20 >> 1/24 Steroids 1/20 >>  Baricitinib 1/27 >> 2/6  Unasyn 2/5 >>  Interim History / Subjective:   Significant agitation requiring increasing benzo drip Remains on the ventilator at 60% FiO2 and 10 of PEEP  Objective   Blood pressure 137/70, pulse (!) 115, temperature 98.78 F (37.1 C), temperature source Bladder, resp. rate (!) 22, height 6\' 6"  (1.981 m), weight 87.7 kg, SpO2 (!) 89 %.    Vent Mode: PCV FiO2 (%):  [50 %-100 %] 60 % Set Rate:  [26 bmp] 26 bmp PEEP:  [10 cmH20] 10 cmH20 Plateau Pressure:  [26 cmH20-29 cmH20] 29 cmH20   Intake/Output Summary (Last 24 hours) at 03/29/2020 0916 Last data  filed at 03/29/2020 0800 Gross per 24 hour  Intake 481.48 ml  Output 925 ml  Net -443.52 ml   Filed Weights   03/22/20 0415 03/23/20 0335 03/28/20 0504  Weight: 90.2 kg 89.3 kg 87.7 kg    Examination: Gen:      No acute distress HEENT:  EOMI, sclera anicteric Neck:     No masses; no thyromegaly, ETT Lungs:    Clear to auscultation bilaterally; normal respiratory effort CV:         Regular rate and rhythm; no murmurs Abd:      + bowel sounds; soft, non-tender; no palpable masses, no distension Ext:    No edema; adequate peripheral perfusion Skin:      Warm and dry; no rash Neuro: Sedated, unresponsive  Labs/imaging personally reviewed Significant for Sodium 132, BUN/creatinine 19/0.42 WBC 14.1, hemoglobin 12.5, platelets 186 Chest x-ray with stable bilateral airspace disease  Resolved Hospital Problem list     Assessment & Plan:   Acute hypoxic respiratory failure from COVID 19 pneumonia. Possible aspiration event while on BiPAP with worsening requiring intubation Possible aspiration event requiring intubation while on BiPAP, now on mechanical ventilation Plan: Has finished remdesivir, baricitinib Continue steroids Unasyn for 7 days Follow intermittent chest x-ray Wean down PEEP/FiO2 as tolerated Lasix 40 mg x 1  SBO/ileus, resolved History of ulcerative colitis. Presented also with gastroenteritis and increased stool volumes on presentation Started on tube feeds.  He has been tolerating it well so far TPN stopped  CAD s/p DES, HTN,  HLD. Hyponatremia. Follow labs, telemetry monitor  DM type 2 poorly controlled with steroid induced hyperglycemia. Hyper glycemia secondary to steroids COVID glycemic protocol Goal CBG 140-180 Continue Lantus, SSI  Agitation Continue fentanyl, Versed Start Precedex and wean down benzos   Best practice (evaluated daily)  Diet: regular diet/TPN DVT prophylaxis: lovenox GI prophylaxis: protonix Mobility: OOB to  chair Disposition: ICU Family: Per primary team Code Status: ICU  Critical care time:   The patient is critically ill with multiple organ system failure and requires high complexity decision making for assessment and support, frequent evaluation and titration of therapies, advanced monitoring, review of radiographic studies and interpretation of complex data.   Critical Care Time devoted to patient care services, exclusive of separately billable procedures, described in this note is 35 minutes.   Marshell Garfinkel MD [place] [facility] See Amion for pager  If no response to pager , please call [phone] until 7pm After 7:00 pm call Elink  [phone] 03/29/2020, 9:16 AM

## 2020-03-30 ENCOUNTER — Inpatient Hospital Stay (HOSPITAL_COMMUNITY): Payer: Commercial Managed Care - PPO

## 2020-03-30 DIAGNOSIS — K56600 Partial intestinal obstruction, unspecified as to cause: Secondary | ICD-10-CM | POA: Diagnosis not present

## 2020-03-30 LAB — GLUCOSE, CAPILLARY
Glucose-Capillary: 191 mg/dL — ABNORMAL HIGH (ref 70–99)
Glucose-Capillary: 201 mg/dL — ABNORMAL HIGH (ref 70–99)
Glucose-Capillary: 223 mg/dL — ABNORMAL HIGH (ref 70–99)
Glucose-Capillary: 232 mg/dL — ABNORMAL HIGH (ref 70–99)
Glucose-Capillary: 239 mg/dL — ABNORMAL HIGH (ref 70–99)
Glucose-Capillary: 322 mg/dL — ABNORMAL HIGH (ref 70–99)

## 2020-03-30 LAB — CBC
HCT: 32.7 % — ABNORMAL LOW (ref 39.0–52.0)
Hemoglobin: 11.3 g/dL — ABNORMAL LOW (ref 13.0–17.0)
MCH: 31.8 pg (ref 26.0–34.0)
MCHC: 34.6 g/dL (ref 30.0–36.0)
MCV: 92.1 fL (ref 80.0–100.0)
Platelets: 171 10*3/uL (ref 150–400)
RBC: 3.55 MIL/uL — ABNORMAL LOW (ref 4.22–5.81)
RDW: 11.7 % (ref 11.5–15.5)
WBC: 9.6 10*3/uL (ref 4.0–10.5)
nRBC: 0 % (ref 0.0–0.2)

## 2020-03-30 LAB — MAGNESIUM: Magnesium: 2 mg/dL (ref 1.7–2.4)

## 2020-03-30 LAB — BASIC METABOLIC PANEL
Anion gap: 8 (ref 5–15)
BUN: 21 mg/dL (ref 8–23)
CO2: 34 mmol/L — ABNORMAL HIGH (ref 22–32)
Calcium: 7.8 mg/dL — ABNORMAL LOW (ref 8.9–10.3)
Chloride: 93 mmol/L — ABNORMAL LOW (ref 98–111)
Creatinine, Ser: 0.38 mg/dL — ABNORMAL LOW (ref 0.61–1.24)
GFR, Estimated: >60 mL/min (ref >60)
Glucose, Bld: 242 mg/dL — ABNORMAL HIGH (ref 70–99)
Potassium: 4.4 mmol/L (ref 3.5–5.1)
Sodium: 135 mmol/L (ref 135–145)

## 2020-03-30 LAB — PHOSPHORUS: Phosphorus: 3 mg/dL (ref 2.5–4.6)

## 2020-03-30 MED ORDER — FUROSEMIDE 10 MG/ML IJ SOLN
40.0000 mg | Freq: Once | INTRAMUSCULAR | Status: AC
  Administered 2020-03-30: 40 mg via INTRAVENOUS
  Filled 2020-03-30: qty 4

## 2020-03-30 MED ORDER — MIDAZOLAM HCL 2 MG/2ML IJ SOLN
1.0000 mg | INTRAMUSCULAR | Status: DC | PRN
  Administered 2020-03-30 – 2020-04-01 (×9): 2 mg via INTRAVENOUS
  Filled 2020-03-30 (×8): qty 2

## 2020-03-30 MED ORDER — METHYLPREDNISOLONE SODIUM SUCC 40 MG IJ SOLR
40.0000 mg | Freq: Two times a day (BID) | INTRAMUSCULAR | Status: DC
  Administered 2020-03-30 – 2020-04-03 (×10): 40 mg via INTRAVENOUS
  Filled 2020-03-30 (×9): qty 1

## 2020-03-30 MED ORDER — CLONAZEPAM 1 MG PO TABS
1.0000 mg | ORAL_TABLET | Freq: Two times a day (BID) | ORAL | Status: DC
  Administered 2020-03-30 (×2): 1 mg
  Filled 2020-03-30 (×2): qty 1

## 2020-03-30 NOTE — TOC Progression Note (Signed)
Transition of Care [facility]) - Progression Note    Patient Details  Name: Calvin Cruz MRN: [id number] Date of Birth: [birth year]  Transition of Care [facility]) CM/SW Contact  Leeroy Cha, RN Phone Number: 03/30/2020, 9:28 AM  Clinical Narrative:    [place] [facility] Events:  1/15 Admit to [facility] 1/18 Endoscopic insertion of NG tube 1/20 start remdesivir, steroids 1/24 needing high flow oxygen and NRB 2/01 start TPN 2/5 Intubated and tx to [facility] 2/6 Stop TPN, start tube feeds he has an OG tube 020922-ventat 50%, iv abx, iv sedation, iv levophed bp ranging 71/49-97/53 Plan: undertermined following for possible ltach need     Barriers to Discharge: Continued Medical Work up  Expected Discharge Plan and Services                                                 Social Determinants of Health (SDOH) Interventions    Readmission Risk Interventions No flowsheet data found.

## 2020-03-30 NOTE — Progress Notes (Signed)
NAME:  Calvin Cruz, MRN:  [id number], DOB:  1960-07-18, LOS: 24 ADMISSION DATE:  03/12/2020, CONSULTATION DATE:  03/21/2020 REFERRING MD:  Dr. Manuella Ghazi, [facility], CHIEF COMPLAINT:  Short of breath   Brief History:  60 year old with ARDS secondary to COVID-19 pneumonia, small bowel obstruction/ileus Intubated on 2/5 after possible aspiration event on BiPAP and transferred to [facility]  Past Medical History:  Ulcerative colitis s/p colectomy, Gout, CAD s/p DES, HTN, HLD, Nephrolithiasis, DM, Erythrocytosis  Significant Hospital Events:  1/15 Admit to [facility] 1/18 Endoscopic insertion of NG tube 1/20 start remdesivir, steroids 1/24 needing high flow oxygen and NRB 2/01 start TPN 2/5 Intubated and tx to [facility] 2/6 Stop TPN, start tube feeds he has an OG tube 2/8 Weaning benzos, started Precedex  Consults:  Gastroenterology s/o 1/28 Surgery s/o 1/19  Procedures:  Rt PICC 2/01 >>   Significant Diagnostic Tests:   CT abd/pelvis 1/16 >> lower lungs clear, mildly dilated air and fluid-filled loops of ileum measuring up to 3.2 cm. No clear transition point or focal wall narrowing  CT angio chest 1/28 >> diffuse b/l airspace opacity consistent with COVID 19 pneumonia  Micro Data:  COVID PCR 1/15 >> Positive Flu PCR 1/15 >> negative Urine culture 2/2-pansensitive E. coli  Antimicrobials:  Remdesivir 1/20 >> 1/24 Steroids 1/20 >>  Baricitinib 1/27 >> 2/6  Unasyn 2/5 >>  Interim History / Subjective:   Continues to have intermittent agitation FiO2 down to 50%.  Objective   Blood pressure (!) 97/53, pulse 84, temperature 98.78 F (37.1 C), temperature source Bladder, resp. rate (!) 25, height 6\' 6"  (1.981 m), weight 87.7 kg, SpO2 92 %.    Vent Mode: PCV FiO2 (%):  [50 %-70 %] 50 % Set Rate:  [26 bmp] 26 bmp PEEP:  [10 cmH20] 10 cmH20 Plateau Pressure:  [19 cmH20-33 cmH20] 28 cmH20   Intake/Output Summary (Last 24 hours) at 03/30/2020 0917 Last data filed at 03/30/2020  0830 Gross per 24 hour  Intake 3436.24 ml  Output 3150 ml  Net 286.24 ml   Filed Weights   03/22/20 0415 03/23/20 0335 03/28/20 0504  Weight: 90.2 kg 89.3 kg 87.7 kg    Examination: Gen:      No acute distress HEENT:  EOMI, sclera anicteric Neck:     No masses; no thyromegaly, ETT Lungs:    Clear to auscultation bilaterally; normal respiratory effort CV:         Regular rate and rhythm; no murmurs Abd:      + bowel sounds; soft, non-tender; no palpable masses, no distension Ext:    No edema; adequate peripheral perfusion Skin:      Warm and dry; no rash Neuro: Sedated, unresponsive  Labs/imaging personally reviewed Significant for BUN/creatinine 21/0.38, glucose 242. Hemoglobin 9.3, platelets 171 No new imaging  Resolved Hospital Problem list     Assessment & Plan:  Acute hypoxic respiratory failure from COVID 19 pneumonia. Possible aspiration event while on BiPAP with worsening requiring intubation Possible aspiration event requiring intubation while on BiPAP, now on mechanical ventilation Plan: Has finished remdesivir, baricitinib Continue steroids. Wean to 40 mg q12 Unasyn for 7 days Follow intermittent chest x-ray Wean down PEEP/FiO2 as tolerated Repeat Lasix 40 mg x 1  E coli UTI Covered by unasyn  SBO/ileus, resolved History of ulcerative colitis. Presented also with gastroenteritis and increased stool volumes on presentation Started on tube feeds.  He has been tolerating it well so far TPN stopped  CAD s/p  DES, HTN, HLD. Hyponatremia. Follow labs, telemetry monitor  DM type 2 poorly controlled with steroid induced hyperglycemia. Hyper glycemia secondary to steroids COVID glycemic protocol Goal CBG 140-180 Continue Lantus, SSI  Agitation Continue fentanyl, Versed Start Precedex and wean down benzos Start enteral klonopin  Best practice (evaluated daily)  Diet: regular diet/TPN DVT prophylaxis: lovenox GI prophylaxis: protonix Mobility: OOB  to chair Disposition: ICU Family: Per primary team Code Status: ICU  Critical care time:   The patient is critically ill with multiple organ system failure and requires high complexity decision making for assessment and support, frequent evaluation and titration of therapies, advanced monitoring, review of radiographic studies and interpretation of complex data.   Critical Care Time devoted to patient care services, exclusive of separately billable procedures, described in this note is 35 minutes.   Marshell Garfinkel MD [place] [facility] See Amion for pager  If no response to pager , please call [phone] until 7pm After 7:00 pm call Elink  [phone] 03/30/2020, 9:17 AM

## 2020-03-30 NOTE — Progress Notes (Signed)
Inpatient Diabetes Program Recommendations  AACE/ADA: New Consensus Statement on Inpatient Glycemic Control (2015)  Target Ranges:  Prepandial:   less than 140 mg/dL      Peak postprandial:   less than 180 mg/dL (1-2 hours)      Critically ill patients:  140 - 180 mg/dL   Lab Results  Component Value Date   GLUCAP 201 (H) 03/30/2020   HGBA1C 6.7 (H) 03/07/2020    Review of Glycemic Control  Diabetes history: DM2 Outpatient Diabetes medications: Jardiance 10 mg QAM Current orders for Inpatient glycemic control: Lantus 22 units BID, Novolog 0-20 units Q4H  Solumedrol 40 mg Q12H 191, 201 mg/dL On TF at 55/H  Inpatient Diabetes Program Recommendations:     Add Novolog 3 units Q4H for TF coverage. Please HOLD if TF is d/ced for any reason.  Continue to follow.  Thank you. Lorenda Peck, RD, LDN, CDE Inpatient Diabetes Coordinator [phone]

## 2020-03-31 ENCOUNTER — Inpatient Hospital Stay (HOSPITAL_COMMUNITY): Payer: Commercial Managed Care - PPO

## 2020-03-31 DIAGNOSIS — K56600 Partial intestinal obstruction, unspecified as to cause: Secondary | ICD-10-CM | POA: Diagnosis not present

## 2020-03-31 LAB — CBC
HCT: 34.7 % — ABNORMAL LOW (ref 39.0–52.0)
Hemoglobin: 11.9 g/dL — ABNORMAL LOW (ref 13.0–17.0)
MCH: 31.6 pg (ref 26.0–34.0)
MCHC: 34.3 g/dL (ref 30.0–36.0)
MCV: 92.3 fL (ref 80.0–100.0)
Platelets: 179 10*3/uL (ref 150–400)
RBC: 3.76 MIL/uL — ABNORMAL LOW (ref 4.22–5.81)
RDW: 11.9 % (ref 11.5–15.5)
WBC: 9.3 10*3/uL (ref 4.0–10.5)
nRBC: 0 % (ref 0.0–0.2)

## 2020-03-31 LAB — GLUCOSE, CAPILLARY
Glucose-Capillary: 120 mg/dL — ABNORMAL HIGH (ref 70–99)
Glucose-Capillary: 137 mg/dL — ABNORMAL HIGH (ref 70–99)
Glucose-Capillary: 153 mg/dL — ABNORMAL HIGH (ref 70–99)
Glucose-Capillary: 204 mg/dL — ABNORMAL HIGH (ref 70–99)
Glucose-Capillary: 209 mg/dL — ABNORMAL HIGH (ref 70–99)
Glucose-Capillary: 212 mg/dL — ABNORMAL HIGH (ref 70–99)

## 2020-03-31 LAB — BASIC METABOLIC PANEL
Anion gap: 9 (ref 5–15)
BUN: 20 mg/dL (ref 8–23)
CO2: 32 mmol/L (ref 22–32)
Calcium: 7.8 mg/dL — ABNORMAL LOW (ref 8.9–10.3)
Chloride: 94 mmol/L — ABNORMAL LOW (ref 98–111)
Creatinine, Ser: 0.41 mg/dL — ABNORMAL LOW (ref 0.61–1.24)
GFR, Estimated: >60 mL/min (ref >60)
Glucose, Bld: 200 mg/dL — ABNORMAL HIGH (ref 70–99)
Potassium: 4.2 mmol/L (ref 3.5–5.1)
Sodium: 135 mmol/L (ref 135–145)

## 2020-03-31 LAB — PHOSPHORUS: Phosphorus: 2.6 mg/dL (ref 2.5–4.6)

## 2020-03-31 LAB — MAGNESIUM: Magnesium: 1.8 mg/dL (ref 1.7–2.4)

## 2020-03-31 MED ORDER — DEXMEDETOMIDINE HCL IN NACL 400 MCG/100ML IV SOLN
0.4000 ug/kg/h | INTRAVENOUS | Status: DC
  Administered 2020-03-31: 19:00:00 1.2 ug/kg/h via INTRAVENOUS
  Administered 2020-03-31 (×2): 1.6 ug/kg/h via INTRAVENOUS
  Administered 2020-03-31: 1.3 ug/kg/h via INTRAVENOUS
  Administered 2020-03-31 (×2): 1.6 ug/kg/h via INTRAVENOUS
  Administered 2020-04-01: 1.8 ug/kg/h via INTRAVENOUS
  Administered 2020-04-01: 1.1 ug/kg/h via INTRAVENOUS
  Administered 2020-04-01: 1 ug/kg/h via INTRAVENOUS
  Administered 2020-04-01: 1.8 ug/kg/h via INTRAVENOUS
  Administered 2020-04-01: 1 ug/kg/h via INTRAVENOUS
  Administered 2020-04-01: 1.2 ug/kg/h via INTRAVENOUS
  Administered 2020-04-01: 1.8 ug/kg/h via INTRAVENOUS
  Administered 2020-04-02 – 2020-04-04 (×14): 1.2 ug/kg/h via INTRAVENOUS
  Filled 2020-03-31 (×27): qty 100

## 2020-03-31 MED ORDER — MIDAZOLAM 50MG/50ML (1MG/ML) PREMIX INFUSION
0.5000 mg/h | INTRAVENOUS | Status: DC
  Administered 2020-03-31: 4 mg/h via INTRAVENOUS
  Administered 2020-03-31: 10:00:00 0.5 mg/h via INTRAVENOUS
  Administered 2020-04-01: 3 mg/h via INTRAVENOUS
  Administered 2020-04-02: 8 mg/h via INTRAVENOUS
  Administered 2020-04-02: 6.5 mg/h via INTRAVENOUS
  Administered 2020-04-02: 7 mg/h via INTRAVENOUS
  Administered 2020-04-02: 8 mg/h via INTRAVENOUS
  Administered 2020-04-03: 10 mg/h via INTRAVENOUS
  Filled 2020-03-31 (×8): qty 50

## 2020-03-31 NOTE — Progress Notes (Addendum)
NAME:  Calvin Cruz, MRN:  [id number], DOB:  25-Aug-1960, LOS: 26 ADMISSION DATE:  03/15/2020, CONSULTATION DATE:  03/21/2020 REFERRING MD:  Dr. Manuella Ghazi, [facility], CHIEF COMPLAINT:  Short of breath   Brief History:  60 year old with ARDS secondary to COVID-19 pneumonia, small bowel obstruction/ileus Intubated on 2/5 after possible aspiration event on BiPAP and transferred to [facility]  Past Medical History:  Ulcerative colitis s/p colectomy, Gout, CAD s/p DES, HTN, HLD, Nephrolithiasis, DM, Erythrocytosis  Significant Hospital Events:  1/15 Admit to [facility] 1/18 Endoscopic insertion of NG tube 1/20 start remdesivir, steroids 1/24 needing high flow oxygen and NRB 2/01 start TPN 2/5 Intubated and tx to [facility] 2/6 Stop TPN, start tube feeds he has an OG tube 2/8 Weaning benzos, started Precedex  Consults:  Gastroenterology s/o 1/28 Surgery s/o 1/19  Procedures:  Rt PICC 2/01 >>   Significant Diagnostic Tests:   CT abd/pelvis 1/16 >> lower lungs clear, mildly dilated air and fluid-filled loops of ileum measuring up to 3.2 cm. No clear transition point or focal wall narrowing  CT angio chest 1/28 >> diffuse b/l airspace opacity consistent with COVID 19 pneumonia  Micro Data:  COVID PCR 1/15 >> Positive Flu PCR 1/15 >> negative Urine culture 2/2-pansensitive E. coli  Antimicrobials:  Remdesivir 1/20 >> 1/24 Steroids 1/20 >>  Baricitinib 1/27 >> 2/6  Unasyn 2/5 >>  Interim History / Subjective:   Fio2 up to 70%. CXR noted with SQ emphysema  Objective   Blood pressure 121/69, pulse (!) 101, temperature 98.78 F (37.1 C), resp. rate (!) 32, height 6\' 6"  (1.981 m), weight 87.7 kg, SpO2 91 %.    Vent Mode: PCV FiO2 (%):  [60 %-70 %] 70 % Set Rate:  [26 bmp] 26 bmp PEEP:  [10 cmH20] 10 cmH20 Plateau Pressure:  [27 cmH20-36 cmH20] 36 cmH20   Intake/Output Summary (Last 24 hours) at 03/31/2020 0840 Last data filed at 03/31/2020 0600 Gross per 24 hour  Intake 2155.01  ml  Output 1975 ml  Net 180.01 ml   Filed Weights   03/22/20 0415 03/23/20 0335 03/28/20 0504  Weight: 90.2 kg 89.3 kg 87.7 kg    Examination: Gen:      No acute distress HEENT:  EOMI, sclera anicteric Neck:     No masses; no thyromegaly, ETT Lungs:    Clear to auscultation bilaterally; normal respiratory effort CV:         Regular rate and rhythm; no murmurs Abd:      + bowel sounds; soft, non-tender; no palpable masses, no distension Ext:    No edema; adequate peripheral perfusion Skin:      Warm and dry; no rash Neuro: Sedated.  Labs/imaging personally reviewed Significant for BUN/creatinine 20/0.41 WBC 9.3, hemoglobin 11.9, platelets 179 Chest x-ray 2/9-subcu emphysema over supraclavicular region and right axillary region.  Possible pneumomediastinum.  Stable bilateral lung opacities.  Resolved Hospital Problem list     Assessment & Plan:  Acute hypoxic respiratory failure from COVID 19 pneumonia. Possible aspiration event while on BiPAP with worsening requiring intubation Possible aspiration event requiring intubation while on BiPAP, now on mechanical ventilation Plan: Has finished remdesivir, baricitinib Continue steroids at 40 mg q12 Unasyn for 7 days Follow intermittent chest x-ray Wean down PEEP/FiO2 CT chest to assess pneumomediastinum.  Monitor for development of pneumothorax  E coli UTI Covered by unasyn  SBO/ileus, resolved History of ulcerative colitis. Presented also with gastroenteritis and increased stool volumes on presentation Started on tube feeds.  He has been tolerating it well so far TPN stopped  CAD s/p DES, HTN, HLD. Hyponatremia. Follow labs, telemetry monitor  DM type 2 poorly controlled with steroid induced hyperglycemia. Hyper glycemia secondary to steroids COVID glycemic protocol Goal CBG 140-180 Continue Lantus, SSI  Agitation Continue fentanyl, Versed as needed We will need to restart Versed GTT due to agitation and need for  sedation while getting the CT  Best practice (evaluated daily)  Diet: regular diet/TPN DVT prophylaxis: lovenox GI prophylaxis: protonix Mobility: OOB to chair Disposition: ICU Family: Wife updated daily at bedside. Last update on 2/10. Code Status: ICU  Critical care time:   The patient is critically ill with multiple organ system failure and requires high complexity decision making for assessment and support, frequent evaluation and titration of therapies, advanced monitoring, review of radiographic studies and interpretation of complex data.   Critical Care Time devoted to patient care services, exclusive of separately billable procedures, described in this note is 35 minutes.   Marshell Garfinkel MD [place] [facility] See Amion for pager  If no response to pager , please call [phone] until 7pm After 7:00 pm call Elink  [phone] 03/31/2020, 8:40 AM

## 2020-03-31 NOTE — Progress Notes (Signed)
PCCM note  CT reviewed with extensive pneumomediastinum and subcutaneous emphysema.  No evidence of pneumothorax  We will switch vent settings to PRVC 6 cc/kg and reduce PEEP to 8 reduce distending pressures.  Marshell Garfinkel MD [place] [facility] See Amion for pager  If no response to pager , please call [phone] until 7pm After 7:00 pm call Elink  [phone] 03/31/2020, 6:22 PM

## 2020-03-31 NOTE — Progress Notes (Signed)
[facility] Progress Note Patient Name: Calvin Cruz DOB: Apr 01, 1960 MRN: [id number]   Date of Service  03/31/2020  HPI/Events of Note  Agitation - Nursing request for more sedation to tolerate CT Scan.  eICU Interventions  Plan: 1. Increase ceiling on Precedex IV infusion to 1.8 mcg/kg/hour.      Intervention Category Major Interventions: Delirium, psychosis, severe agitation - evaluation and management  [name] 03/31/2020, 6:35 AM

## 2020-04-01 ENCOUNTER — Inpatient Hospital Stay (HOSPITAL_COMMUNITY): Payer: Commercial Managed Care - PPO

## 2020-04-01 DIAGNOSIS — J982 Interstitial emphysema: Secondary | ICD-10-CM

## 2020-04-01 DIAGNOSIS — U071 COVID-19: Secondary | ICD-10-CM | POA: Diagnosis not present

## 2020-04-01 DIAGNOSIS — J80 Acute respiratory distress syndrome: Secondary | ICD-10-CM | POA: Diagnosis not present

## 2020-04-01 LAB — GLUCOSE, CAPILLARY
Glucose-Capillary: 156 mg/dL — ABNORMAL HIGH (ref 70–99)
Glucose-Capillary: 161 mg/dL — ABNORMAL HIGH (ref 70–99)
Glucose-Capillary: 189 mg/dL — ABNORMAL HIGH (ref 70–99)
Glucose-Capillary: 195 mg/dL — ABNORMAL HIGH (ref 70–99)
Glucose-Capillary: 204 mg/dL — ABNORMAL HIGH (ref 70–99)
Glucose-Capillary: 212 mg/dL — ABNORMAL HIGH (ref 70–99)
Glucose-Capillary: 233 mg/dL — ABNORMAL HIGH (ref 70–99)

## 2020-04-01 LAB — MAGNESIUM: Magnesium: 2.1 mg/dL (ref 1.7–2.4)

## 2020-04-01 LAB — CBC
HCT: 35.2 % — ABNORMAL LOW (ref 39.0–52.0)
Hemoglobin: 12 g/dL — ABNORMAL LOW (ref 13.0–17.0)
MCH: 31.7 pg (ref 26.0–34.0)
MCHC: 34.1 g/dL (ref 30.0–36.0)
MCV: 92.9 fL (ref 80.0–100.0)
Platelets: 168 10*3/uL (ref 150–400)
RBC: 3.79 MIL/uL — ABNORMAL LOW (ref 4.22–5.81)
RDW: 12.1 % (ref 11.5–15.5)
WBC: 10.5 10*3/uL (ref 4.0–10.5)
nRBC: 0 % (ref 0.0–0.2)

## 2020-04-01 LAB — BASIC METABOLIC PANEL
Anion gap: 10 (ref 5–15)
BUN: 26 mg/dL — ABNORMAL HIGH (ref 8–23)
CO2: 31 mmol/L (ref 22–32)
Calcium: 8.2 mg/dL — ABNORMAL LOW (ref 8.9–10.3)
Chloride: 94 mmol/L — ABNORMAL LOW (ref 98–111)
Creatinine, Ser: 0.41 mg/dL — ABNORMAL LOW (ref 0.61–1.24)
GFR, Estimated: >60 mL/min (ref >60)
Glucose, Bld: 196 mg/dL — ABNORMAL HIGH (ref 70–99)
Potassium: 5 mmol/L (ref 3.5–5.1)
Sodium: 135 mmol/L (ref 135–145)

## 2020-04-01 LAB — PHOSPHORUS: Phosphorus: 3.7 mg/dL (ref 2.5–4.6)

## 2020-04-01 MED ORDER — INSULIN GLARGINE 100 UNIT/ML ~~LOC~~ SOLN
25.0000 [IU] | Freq: Two times a day (BID) | SUBCUTANEOUS | Status: DC
  Administered 2020-04-01 – 2020-04-02 (×3): 25 [IU] via SUBCUTANEOUS
  Filled 2020-04-01 (×4): qty 0.25

## 2020-04-01 MED ORDER — MIDAZOLAM BOLUS VIA INFUSION
1.0000 mg | INTRAVENOUS | Status: DC | PRN
  Administered 2020-04-01 (×3): 2 mg via INTRAVENOUS
  Administered 2020-04-01 – 2020-04-02 (×2): 1 mg via INTRAVENOUS
  Administered 2020-04-02 – 2020-04-03 (×5): 2 mg via INTRAVENOUS
  Filled 2020-04-01: qty 2

## 2020-04-01 MED ORDER — NOREPINEPHRINE 4 MG/250ML-% IV SOLN
0.0000 ug/min | INTRAVENOUS | Status: DC
  Administered 2020-04-01: 5 ug/min via INTRAVENOUS
  Administered 2020-04-02: 6 ug/min via INTRAVENOUS
  Administered 2020-04-02: 8 ug/min via INTRAVENOUS
  Administered 2020-04-02: 7 ug/min via INTRAVENOUS
  Administered 2020-04-03: 4 ug/min via INTRAVENOUS
  Administered 2020-04-03: 6 ug/min via INTRAVENOUS
  Administered 2020-04-04: 7 ug/min via INTRAVENOUS
  Filled 2020-04-01 (×7): qty 250

## 2020-04-01 NOTE — Progress Notes (Signed)
Nutrition Follow-up  DOCUMENTATION CODES:   Not applicable  INTERVENTION:  - continue Vital 1.5 @ 55 ml/hr with 90 ml Prosource TF TID. - free water flush, if desired, to be per CCM.   NUTRITION DIAGNOSIS:   Inadequate oral intake related to inability to eat as evidenced by NPO status. -ongoing  GOAL:   Patient will meet greater than or equal to 90% of their needs -met with TF regimen  MONITOR:   Vent status,TF tolerance,Labs,Weight trends,Skin  REASON FOR ASSESSMENT:   Ventilator,Consult Enteral/tube feeding initiation and management  ASSESSMENT:   Patient is a 60 yo male with history of Ulcerative colitis s/p ileal anastomosis /colectomy, gout, CAD, DM2 and HTN. He presents with small bowel obstruction and COVID-19 pneumonia and gastroenteritis/infection.  Significant Events: 1/15- admission 1/18- endoscopic insertion of NGT 1/19- Surgery signed off 1/31- diet advanced from NPO to Low Residue 2/3- Soft diet 2/1- TPN initiation 2/5- intubation; OGT placement 2/6- TPN stopped; TF initiation  Patient remains intubated with OGT in place. Able to communicate with RN via secure chat. She confirms that patient is receiving Vital 1.5 @ 55 ml/hr and 90 ml Prosource TF TID. This regimen provides 2220 kcal (95% re-estimated kcal need), 155 grams protein, and 1006 ml free water.   Weight +4 lb over the past 4 days. Non-pitting edema to BLE documented in the edema section of flow sheet.   Per notes: - no proning required - UTI - poorly controlled type 2 DM - agitation     Patient is currently intubated on ventilator support MV: 14.6 L/min Temp (24hrs), Avg:99.5 F (37.5 C), Min:98.6 F (37 C), Max:100.22 F (37.9 C) Propofol: none  Labs reviewed; CBGs: 189, 233, 195 mg/dl, Cl: 94 mmol/l, BUN: 26 mg/dl, creatinine: 0.41 mg/dl, Ca: 8.2 mg/dl.  Medications reviewed; 1000 mg ascorbic acid/day, 5000 units cholecalciferol/day, 100 mg colace BID, sliding scale novolog, 25  units lantus/day, 40 mg solu-medrol BID, 40 mg protonix BID, 17 g miralax/day, 220 mg zinc sulfate/day.  Drips; precedex @ 1.1 mcg/kg/hr, versed @ 3 mg/hr, levo @ 5 mcg/min.    Diet Order:   Diet Order            Diet NPO time specified  Diet effective now                 EDUCATION NEEDS:   Not appropriate for education at this time  Skin:  Skin Assessment: Skin Integrity Issues: Skin Integrity Issues:: Stage I Stage I: sacrum  Last BM:  2/11 (type 6)  Height:   Ht Readings from Last 1 Encounters:  03/31/20 '6\' 6"'  (1.981 m)    Weight:   Wt Readings from Last 1 Encounters:  04/01/20 89.3 kg    Estimated Nutritional Needs:  Kcal:  2332 kcal Protein:  150-170 grams Fluid:  >/= 2.5 L/day    Jarome Matin, MS, RD, LDN, CNSC Inpatient Clinical Dietitian RD pager # available in [place]  After hours/weekend pager # available in [facility]

## 2020-04-01 NOTE — Progress Notes (Signed)
NAME:  Calvin Cruz, MRN:  [id number], DOB:  October 19, 1960, LOS: 70 ADMISSION DATE:  03/02/2020, CONSULTATION DATE:  03/21/2020 REFERRING MD:  Dr. Manuella Ghazi, [facility], CHIEF COMPLAINT:  Short of breath   Brief History:  60 year old with ARDS secondary to COVID-19 pneumonia, small bowel obstruction/ileus Intubated on 2/5  and transferred to [facility] long hospital  Past Medical History:  Ulcerative colitis s/p colectomy, Gout, CAD s/p DES, HTN, HLD, Nephrolithiasis, DM, Erythrocytosis  Significant Hospital Events:  1/15 Admit to [facility] 1/18 Endoscopic insertion of NG tube 1/20 start remdesivir, steroids 1/24 needing high flow oxygen and NRB 2/01 start TPN 2/5 Intubated and tx to [facility] 2/6 Stop TPN, start tube feeds he has an OG tube 2/8 Weaning benzos, started Precedex 2/10 pneumo-mediastinum, drop tidal volume 540 cc  Consults:  Gastroenterology s/o 1/28 Surgery s/o 1/19  Procedures:  Rt PICC 2/01 >>   Significant Diagnostic Tests:   CT abd/pelvis 1/16 >> lower lungs clear, mildly dilated air and fluid-filled loops of ileum measuring up to 3.2 cm. No clear transition point or focal wall narrowing  CT angio chest 1/28 >> diffuse b/l airspace opacity consistent with COVID 19 pneumonia  CT chest WO 2/10 >> diffuse pneumo mediastinum and subcutaneous emphysema, extensive bilateral groundglass disease  Micro Data:  COVID PCR 1/15 >> Positive Flu PCR 1/15 >> negative Urine culture 2/2-pansensitive E. coli  Antimicrobials:  Remdesivir 1/20 >> 1/24 Steroids 1/20 >>  Baricitinib 1/27 >> 2/6  Unasyn 2/5 >>  Interim History / Subjective:   Remains critically ill, intubated Soft blood pressure Sedated on Versed, fentanyl at 100 and high-dose Precedex Low-grade fevers 100.2 Good urine output  Objective   Blood pressure 112/73, pulse (!) 106, temperature 98.6 F (37 C), resp. rate (!) 37, height 6\' 6"  (1.981 m), weight 89.3 kg, SpO2 (!) 89 %.    Vent Mode: PRVC FiO2 (%):  [70 %]  70 % Set Rate:  [26 bmp] 26 bmp Vt Set:  [540 mL] 540 mL PEEP:  [8 cmH20-10 cmH20] 8 cmH20 Plateau Pressure:  [25 cmH20-39 cmH20] 26 cmH20   Intake/Output Summary (Last 24 hours) at 04/01/2020 0948 Last data filed at 04/01/2020 0935 Gross per 24 hour  Intake 3385.99 ml  Output 2125 ml  Net 1260.99 ml   Filed Weights   03/23/20 0335 03/28/20 0504 04/01/20 0500  Weight: 89.3 kg 87.7 kg 89.3 kg    Examination: Gen:      No acute distress HEENT:  EOMI, sclera anicteric Neck:     No masses; no thyromegaly, ETT Lungs:    Mild accessory muscle use, bilateral air entry present, CV:         Regular rate and rhythm; no murmurs Abd:      + bowel sounds; soft, non-tender; no palpable masses, no distension Ext:    No edema; adequate peripheral perfusion Skin:      Warm and dry; no rash Neuro: On drips, RASS 0 to -1, wakes easily, grossly nonfocal  Labs/imaging personally reviewed Significant for mild hyperkalemia, no leukocytosis, mild hyperglycemia  Chest x-ray 2/11 independently reviewed shows decrease in subcutaneous emphysema compared to 2/9, unchanged diffuse bilateral airspace disease  Resolved Hospital Problem list     Assessment & Plan:  ARDS from COVID 19 pneumonia. Possible aspiration event while on BiPAP with worsening requiring intubation 2/10 pneumomediastinum/subcutaneous emphysema due to barotrauma Plan: Continue low tidal volume ventilation per ARDS protocol Target TV 6-8cc/kgIBW, on 540 cc = 6cc Target Plateau Pressure < 30cm H20  Target driving pressure less than 15 cm of water Target PaO2 55-65: titrate PEEP/FiO2 per protocol Proning not required Not monitoring CVP, keep even-negative balance  Has finished remdesivir, baricitinib Continue steroids at 40 mg q12 Unasyn for 7 days, can stop after today   E coli UTI Covered by unasyn  SBO/ileus, resolved History of ulcerative colitis. Presented also with gastroenteritis and increased stool volumes on  presentation Started on tube feeds -tolerating TPN stopped  CAD s/p DES, HTN, HLD. Hyponatremia. Follow labs, telemetry monitor  DM type 2 poorly controlled with steroid induced hyperglycemia. Hyper glycemia secondary to steroids COVID glycemic protocol Goal CBG 140-180 Increase Lantus 25 units twice daily, SSI  Agitation Increase fentanyl max to 400, continue Versed. Lower Precedex to 1.2 mics due to hypotension. Goal RASS zero to -1 but also trying to avoid vent asynchrony due to barotrauma   Best practice (evaluated daily)  Diet: regular diet/TPN DVT prophylaxis: lovenox GI prophylaxis: protonix Mobility: OOB to chair Disposition: ICU Family: Wife updated daily  Code Status: ICU  Critical care time:   Patient critically ill due to ARDS, Covid Interventions to address this today vent management, sedation titration Risk of deterioration without these interventions is high  I personally spent 35 minutes providing critical care not including any separately billable procedures  Kara Mead MD. FCCP. [facility] Pager : [phone]  If no response to pager , please call [phone] until 7 pm After 7:00 pm call Elink  [phone]    04/01/2020, 9:48 AM

## 2020-04-02 ENCOUNTER — Inpatient Hospital Stay (HOSPITAL_COMMUNITY): Payer: Commercial Managed Care - PPO

## 2020-04-02 DIAGNOSIS — R0902 Hypoxemia: Secondary | ICD-10-CM | POA: Diagnosis not present

## 2020-04-02 DIAGNOSIS — R579 Shock, unspecified: Secondary | ICD-10-CM | POA: Diagnosis not present

## 2020-04-02 DIAGNOSIS — U071 COVID-19: Secondary | ICD-10-CM | POA: Diagnosis not present

## 2020-04-02 DIAGNOSIS — J80 Acute respiratory distress syndrome: Secondary | ICD-10-CM

## 2020-04-02 LAB — GLUCOSE, CAPILLARY
Glucose-Capillary: 248 mg/dL — ABNORMAL HIGH (ref 70–99)
Glucose-Capillary: 240 mg/dL — ABNORMAL HIGH (ref 70–99)
Glucose-Capillary: 237 mg/dL — ABNORMAL HIGH (ref 70–99)
Glucose-Capillary: 314 mg/dL — ABNORMAL HIGH (ref 70–99)
Glucose-Capillary: 236 mg/dL — ABNORMAL HIGH (ref 70–99)
Glucose-Capillary: 187 mg/dL — ABNORMAL HIGH (ref 70–99)

## 2020-04-02 LAB — BASIC METABOLIC PANEL
Anion gap: 9 (ref 5–15)
BUN: 23 mg/dL (ref 8–23)
CO2: 31 mmol/L (ref 22–32)
Calcium: 8 mg/dL — ABNORMAL LOW (ref 8.9–10.3)
Chloride: 93 mmol/L — ABNORMAL LOW (ref 98–111)
Creatinine, Ser: 0.36 mg/dL — ABNORMAL LOW (ref 0.61–1.24)
GFR, Estimated: >60 mL/min (ref >60)
Glucose, Bld: 297 mg/dL — ABNORMAL HIGH (ref 70–99)
Potassium: 4.8 mmol/L (ref 3.5–5.1)
Sodium: 133 mmol/L — ABNORMAL LOW (ref 135–145)

## 2020-04-02 LAB — PHOSPHORUS: Phosphorus: 3.2 mg/dL (ref 2.5–4.6)

## 2020-04-02 LAB — MAGNESIUM: Magnesium: 2 mg/dL (ref 1.7–2.4)

## 2020-04-02 MED ORDER — CLONAZEPAM 1 MG PO TABS
2.0000 mg | ORAL_TABLET | Freq: Two times a day (BID) | ORAL | Status: DC
  Administered 2020-04-02 (×2): 2 mg via ORAL
  Filled 2020-04-02 (×3): qty 2

## 2020-04-02 NOTE — Progress Notes (Signed)
NAME:  Calvin Cruz, MRN:  [id number], DOB:  10-Sep-1960, LOS: 35 ADMISSION DATE:  02/21/2020, CONSULTATION DATE:  03/21/2020 REFERRING MD:  Dr. Manuella Ghazi, [facility], CHIEF COMPLAINT:  Short of breath   Brief History:  60 year old never vaccinated for covid with ARDS secondary to COVID-19 pneumonia, small bowel obstruction/ileus Intubated on 2/5  and transferred to [facility] long hospital  Past Medical History:  Ulcerative colitis s/p colectomy, Gout, CAD s/p DES, HTN, HLD, Nephrolithiasis, DM, Erythrocytosis  Significant Hospital Events:  1/15 Admit to [facility] 1/18 Endoscopic insertion of NG tube 1/20 start remdesivir, steroids 1/24 needing high flow oxygen and NRB 2/01 start TPN 2/5 Intubated and tx to [facility] 2/6 Stop TPN, start tube feeds he has an OG tube 2/8 Weaning benzos, started Precedex 2/10 pneumo-mediastinum, drop tidal volume 540 cc  Consults:  Gastroenterology s/o 1/28 Surgery s/o 1/19  Procedures:  Rt PICC 2/01 >>   Significant Diagnostic Tests:   CT abd/pelvis 1/16 >> lower lungs clear, mildly dilated air and fluid-filled loops of ileum measuring up to 3.2 cm. No clear transition point or focal wall narrowing  CT angio chest 1/28 >> diffuse b/l airspace opacity consistent with COVID 19 pneumonia  CT chest WO 2/10 >> diffuse pneumo mediastinum and subcutaneous emphysema, extensive bilateral groundglass disease  Micro Data:  COVID PCR 1/15 >> Positive Flu PCR 1/15 >> negative Urine culture 2/2-pansensitive E. coli  Antimicrobials:  Remdesivir 1/20 >> 1/24 Steroids 1/20 >>  Baricitinib 1/27 >> 2/6  Unasyn 2/5 >>  2/12   Scheduled Meds: . vitamin C  1,000 mg Per Tube Daily  . chlorhexidine gluconate (MEDLINE KIT)  15 mL Mouth Rinse BID  . Chlorhexidine Gluconate Cloth  6 each Topical Daily  . cholecalciferol  5,000 Units Per Tube Daily  . diclofenac Sodium  4 g Topical QID  . docusate  100 mg Per Tube BID  . enoxaparin (LOVENOX) injection  40 mg Subcutaneous  Q24H  . feeding supplement (PROSource TF)  90 mL Per Tube TID  . fluticasone  1 spray Each Nare Daily  . insulin aspart  0-20 Units Subcutaneous Q4H  . insulin glargine  25 Units Subcutaneous BID  . ipratropium-albuterol  3 mL Nebulization TID  . lidocaine  1 patch Transdermal Q24H  . mouth rinse  15 mL Mouth Rinse 10 times per day  . methylPREDNISolone (SOLU-MEDROL) injection  40 mg Intravenous Q12H  . pantoprazole sodium  40 mg Per Tube BID  . polyethylene glycol  17 g Per Tube Daily  . sodium chloride flush  10-40 mL Intracatheter Q12H  . zinc sulfate  220 mg Per Tube Daily   Continuous Infusions: . ampicillin-sulbactam (UNASYN) IV Stopped (04/02/20 0514)  . dexmedetomidine (PRECEDEX) IV infusion 1.2 mcg/kg/hr (04/02/20 0603)  . feeding supplement (VITAL 1.5 CAL) 1,000 mL (03/30/20 0937)  . fentaNYL infusion INTRAVENOUS 400 mcg/hr (04/02/20 0436)  . midazolam 6.5 mg/hr (04/02/20 0436)  . norepinephrine (LEVOPHED) Adult infusion 8 mcg/min (04/02/20 0436)   PRN Meds:.acetaminophen, fentaNYL, haloperidol lactate, hyoscyamine, metoCLOPramide (REGLAN) injection, midazolam, phenol, promethazine, sodium chloride, sodium chloride flush      Interim History / Subjective:  Sedated on precedex/ versed/fentanyl and still on levophed   Objective   Blood pressure 123/64, pulse 68, temperature 99.14 F (37.3 C), resp. rate (!) 25, height '6\' 6"'  (1.981 m), weight 87.7 kg, SpO2 93 %.    Vent Mode: PRVC FiO2 (%):  [50 %-80 %] 50 % Set Rate:  [26 bmp] 26 bmp Vt Set:  [  540 mL] 540 mL PEEP:  [8 cmH20] 8 cmH20 Plateau Pressure:  [27 cmH20-34 cmH20] 27 cmH20   Intake/Output Summary (Last 24 hours) at 04/02/2020 0634 Last data filed at 04/02/2020 0436 Gross per 24 hour  Intake 3984.86 ml  Output 2400 ml  Net 1584.86 ml   Filed Weights   03/28/20 0504 04/01/20 0500 04/02/20 0500  Weight: 87.7 kg 89.3 kg 87.7 kg    Examination: Gen: Tmax  99.7  Pt no response to verbal No  jvd Oropharynx et/og Neck supple Lungs with a few scattered exp > insp rhonchi bilaterally RRR no s3 or or sign murmur Abd soft with nl excursion  Extr  PAS/ slt cool    I personally reviewed images and agree with radiology impression as follows:  CXR:   2/12 portable 1. Stable lines and tubes. 2. Regression of pneumomediastinum and subcutaneous emphysema since 03/30/2020. 3. Underlying COVID-19 pneumonia with ventilation not significantly changed from 03/27/2020.    Resolved Hospital Problem list     Assessment & Plan:  ARDS from COVID 19 pneumonia. Possible aspiration event while on BiPAP with worsening requiring intubation 2/10 pneumomediastinum/subcutaneous emphysema due to barotrauma Plan: Continue low tidal volume ventilation per ARDS protocol Target TV 6-8cc/kgIBW, on 540 cc = 6cc Target Plateau Pressure < 30cm H20 @  Goal 2/12 with Plat 27 Target driving pressure less than 15 cm of water Target PaO2 55-65: titrate PEEP/FiO2 per protocol Proning not required    Has finished remdesivir, baricitinib Continue steroids at 40 mg q12 Unasyn d/c 2/12 (as planned x 7 d course complete)   E coli UTI Covered by unasyn  SBO/ileus, resolved History of ulcerative colitis. Presented also with gastroenteritis and increased stool volumes on presentation tol tf ok   CAD s/p DES, HTN, HLD. Hyponatremia. -  Na 133 2/12 slt trend down/ monitor   DM type 2 poorly controlled with steroid induced hyperglycemia. Hyper glycemia secondary to steroids COVID glycemic protocol Goal CBG 140-180 Continue Lantus 25 units twice daily, SSI  Agitation Fentanyl max to 400 change versed to clonazepam per FB  Wean precedex ex tol Goal RASS zero to -1 but also trying to avoid vent asynchrony due to barotrauma  Circulatory shock, not present on admit  - still on levophed  >>>  Check cvp / minimize peep per ards protocol    Best practice (evaluated daily)  Diet: TF DVT  prophylaxis: lovenox GI prophylaxis: protonix Mobility: sbr Disposition: ICU Family:  Pending  Code Status: full     The patient is critically ill with multiple organ systems failure and requires high complexity decision making for assessment and support, frequent evaluation and titration of therapies, application of advanced monitoring technologies and extensive interpretation of multiple databases. Critical Care Time devoted to patient care services described in this note is 45 minutes.    Christinia Gully, MD Pulmonary and [place] [phone]   After 7:00 pm call Elink  [phone]

## 2020-04-03 DIAGNOSIS — U071 COVID-19: Secondary | ICD-10-CM | POA: Diagnosis not present

## 2020-04-03 DIAGNOSIS — R579 Shock, unspecified: Secondary | ICD-10-CM | POA: Diagnosis not present

## 2020-04-03 DIAGNOSIS — J80 Acute respiratory distress syndrome: Secondary | ICD-10-CM | POA: Diagnosis not present

## 2020-04-03 LAB — CBC
HCT: 36 % — ABNORMAL LOW (ref 39.0–52.0)
Hemoglobin: 12.4 g/dL — ABNORMAL LOW (ref 13.0–17.0)
MCH: 31.8 pg (ref 26.0–34.0)
MCHC: 34.4 g/dL (ref 30.0–36.0)
MCV: 92.3 fL (ref 80.0–100.0)
Platelets: 245 10*3/uL (ref 150–400)
RBC: 3.9 MIL/uL — ABNORMAL LOW (ref 4.22–5.81)
RDW: 12.4 % (ref 11.5–15.5)
WBC: 14.5 10*3/uL — ABNORMAL HIGH (ref 4.0–10.5)
nRBC: 0 % (ref 0.0–0.2)

## 2020-04-03 LAB — GLUCOSE, CAPILLARY
Glucose-Capillary: 275 mg/dL — ABNORMAL HIGH (ref 70–99)
Glucose-Capillary: 236 mg/dL — ABNORMAL HIGH (ref 70–99)
Glucose-Capillary: 190 mg/dL — ABNORMAL HIGH (ref 70–99)
Glucose-Capillary: 230 mg/dL — ABNORMAL HIGH (ref 70–99)
Glucose-Capillary: 244 mg/dL — ABNORMAL HIGH (ref 70–99)

## 2020-04-03 LAB — BASIC METABOLIC PANEL
Anion gap: 9 (ref 5–15)
BUN: 20 mg/dL (ref 8–23)
CO2: 34 mmol/L — ABNORMAL HIGH (ref 22–32)
Calcium: 8.2 mg/dL — ABNORMAL LOW (ref 8.9–10.3)
Chloride: 93 mmol/L — ABNORMAL LOW (ref 98–111)
Creatinine, Ser: 0.36 mg/dL — ABNORMAL LOW (ref 0.61–1.24)
GFR, Estimated: >60 mL/min (ref >60)
Glucose, Bld: 272 mg/dL — ABNORMAL HIGH (ref 70–99)
Potassium: 4.6 mmol/L (ref 3.5–5.1)
Sodium: 136 mmol/L (ref 135–145)

## 2020-04-03 MED ORDER — QUETIAPINE FUMARATE 100 MG PO TABS
100.0000 mg | ORAL_TABLET | Freq: Two times a day (BID) | ORAL | Status: DC
  Administered 2020-04-03 – 2020-04-14 (×24): 100 mg
  Filled 2020-04-03 (×25): qty 1

## 2020-04-03 MED ORDER — PROPOFOL 1000 MG/100ML IV EMUL
5.0000 ug/kg/min | INTRAVENOUS | Status: DC
  Administered 2020-04-03 (×2): 15 ug/kg/min via INTRAVENOUS
  Administered 2020-04-03: 5 ug/kg/min via INTRAVENOUS
  Administered 2020-04-04: 38.008 ug/kg/min via INTRAVENOUS
  Administered 2020-04-04 (×5): 50 ug/kg/min via INTRAVENOUS
  Administered 2020-04-05 (×6): 45 ug/kg/min via INTRAVENOUS
  Administered 2020-04-06: 30 ug/kg/min via INTRAVENOUS
  Administered 2020-04-06 (×2): 40 ug/kg/min via INTRAVENOUS
  Administered 2020-04-06: 25 ug/kg/min via INTRAVENOUS
  Administered 2020-04-07: 40 ug/kg/min via INTRAVENOUS
  Administered 2020-04-07: 30 ug/kg/min via INTRAVENOUS
  Administered 2020-04-07: 50 ug/kg/min via INTRAVENOUS
  Administered 2020-04-07: 30 ug/kg/min via INTRAVENOUS
  Administered 2020-04-07: 40 ug/kg/min via INTRAVENOUS
  Administered 2020-04-08 – 2020-04-09 (×7): 30 ug/kg/min via INTRAVENOUS
  Administered 2020-04-10: 25 ug/kg/min via INTRAVENOUS
  Administered 2020-04-10 (×2): 30 ug/kg/min via INTRAVENOUS
  Administered 2020-04-11: 25 ug/kg/min via INTRAVENOUS
  Administered 2020-04-11 – 2020-04-13 (×6): 15 ug/kg/min via INTRAVENOUS
  Filled 2020-04-03 (×45): qty 100

## 2020-04-03 MED ORDER — INSULIN GLARGINE 100 UNIT/ML ~~LOC~~ SOLN
35.0000 [IU] | Freq: Two times a day (BID) | SUBCUTANEOUS | Status: DC
  Administered 2020-04-03 – 2020-04-04 (×4): 35 [IU] via SUBCUTANEOUS
  Filled 2020-04-03 (×5): qty 0.35

## 2020-04-03 MED ORDER — CLONAZEPAM 1 MG PO TABS
2.0000 mg | ORAL_TABLET | Freq: Four times a day (QID) | ORAL | Status: DC
  Administered 2020-04-03 – 2020-04-11 (×34): 2 mg via ORAL
  Filled 2020-04-03 (×34): qty 2

## 2020-04-03 MED ORDER — SODIUM CHLORIDE 0.9 % IV SOLN: INTRAVENOUS | Status: DC

## 2020-04-03 NOTE — Progress Notes (Addendum)
NAME:  Calvin Cruz, MRN:  [id number], DOB:  Aug 18, 1960, LOS: 26 ADMISSION DATE:  03/19/2020, CONSULTATION DATE:  03/21/2020 REFERRING MD:  Dr. Manuella Ghazi, [facility], CHIEF COMPLAINT:  Short of breath   Brief History:  60 year old never vaccinated for covid with ARDS secondary to COVID-19 pneumonia, small bowel obstruction/ileus Intubated on 2/5  and transferred to [facility] hospital  Past Medical History:  Ulcerative colitis s/p colectomy, Gout, CAD s/p DES, HTN, HLD, Nephrolithiasis, DM, Erythrocytosis  Significant Hospital Events:  1/15 Admit to [facility] 1/18 Endoscopic insertion of NG tube 1/20 start remdesivir, steroids 1/24 needing high flow oxygen and NRB 2/01 start TPN 2/5 Intubated and tx to [facility] 2/6 Stop TPN, start tube feeds he has an OG tube 2/8 Weaning benzos, started Precedex 2/10 pneumo-mediastinum, dropped tidal volume 540 cc  Consults:  Gastroenterology s/o 1/28 Surgery s/o 1/19  Procedures:  Rt PICC 2/01 >>   Significant Diagnostic Tests:   CT abd/pelvis 1/16 >> lower lungs clear, mildly dilated air and fluid-filled loops of ileum measuring up to 3.2 cm. No clear transition point or focal wall narrowing  CT angio chest 1/28 >> diffuse b/l airspace opacity consistent with COVID 19 pneumonia  CT chest WO 2/10 >> diffuse pneumo mediastinum and subcutaneous emphysema, extensive bilateral groundglass disease  Micro Data:  COVID PCR 1/15 >> Positive Flu PCR 1/15 >> negative Urine culture 2/2-pansensitive E. coli  Antimicrobials:  Remdesivir 1/20 >> 1/24 Steroids 1/20 >>  Baricitinib 1/27 >> 2/6  Unasyn 2/5 >>  2/12   Scheduled Meds: . vitamin C  1,000 mg Per Tube Daily  . chlorhexidine gluconate (MEDLINE KIT)  15 mL Mouth Rinse BID  . Chlorhexidine Gluconate Cloth  6 each Topical Daily  . cholecalciferol  5,000 Units Per Tube Daily  . clonazePAM  2 mg Oral BID  . diclofenac Sodium  4 g Topical QID  . docusate  100 mg Per Tube BID  . enoxaparin (LOVENOX)  injection  40 mg Subcutaneous Q24H  . feeding supplement (PROSource TF)  90 mL Per Tube TID  . fluticasone  1 spray Each Nare Daily  . insulin aspart  0-20 Units Subcutaneous Q4H  . insulin glargine  25 Units Subcutaneous BID  . ipratropium-albuterol  3 mL Nebulization TID  . lidocaine  1 patch Transdermal Q24H  . mouth rinse  15 mL Mouth Rinse 10 times per day  . methylPREDNISolone (SOLU-MEDROL) injection  40 mg Intravenous Q12H  . pantoprazole sodium  40 mg Per Tube BID  . polyethylene glycol  17 g Per Tube Daily  . sodium chloride flush  10-40 mL Intracatheter Q12H  . zinc sulfate  220 mg Per Tube Daily   Continuous Infusions: . dexmedetomidine (PRECEDEX) IV infusion 1.2 mcg/kg/hr (04/03/20 0800)  . feeding supplement (VITAL 1.5 CAL) 1,000 mL (03/30/20 0937)  . fentaNYL infusion INTRAVENOUS 300 mcg/hr (04/03/20 0800)  . midazolam 3 mg/hr (04/03/20 0826)  . norepinephrine (LEVOPHED) Adult infusion 4 mcg/min (04/03/20 0800)  . propofol (DIPRIVAN) infusion 15 mcg/kg/min (04/03/20 0800)   PRN Meds:.acetaminophen, fentaNYL, haloperidol lactate, hyoscyamine, metoCLOPramide (REGLAN) injection, midazolam, phenol, promethazine, sodium chloride, sodium chloride flush      Interim History / Subjective:  dysynchronous with vent over night/ placed on propofol by elink around  4am but not breathing over vent on am rounds / still on low doses of levophed  Objective   Blood pressure 98/63, pulse 62, temperature 99.5 F (37.5 C), temperature source Bladder, resp. rate (!) 27, height '6\' 6"'  (  1.981 m), weight 87.7 kg, SpO2 98 %. CVP:  [4 mmHg-9 mmHg] 4 mmHg  Vent Mode: PRVC FiO2 (%):  [50 %-70 %] 70 % Set Rate:  [26 bmp] 26 bmp Vt Set:  [540 mL] 540 mL PEEP:  [8 cmH20] 8 cmH20 Plateau Pressure:  [23 cmH20-34 cmH20] 23 cmH20   Intake/Output Summary (Last 24 hours) at 04/03/2020 0848 Last data filed at 04/03/2020 0800 Gross per 24 hour  Intake 4182.89 ml  Output 5675 ml  Net -1492.11 ml    Filed Weights   03/28/20 0504 04/01/20 0500 04/02/20 0500  Weight: 87.7 kg 89.3 kg 87.7 kg   CVP:  [4 mmHg-9 mmHg] 4 mmHg    Examination: Gen: Tmax  100 Pt sedated, no response to verbal No jvd Oropharynx  Et /OG  Neck supple Lungs with a few scattered exp > insp rhonchi bilaterally RRR no s3 or or sign murmur Abd soft nlt excursion  Extr warm with pas/ no edema           Resolved Hospital Problem list     Assessment & Plan:  ARDS from COVID 19 pneumonia. Possible aspiration event while on BiPAP with worsening requiring intubation 2/10 pneumomediastinum/subcutaneous emphysema due to barotrauma Plan: Continue low tidal volume ventilation per ARDS protocol Target TV 6-8cc/kgIBW, on 540 cc = 6cc Target Plateau Pressure < 30cm H20  With plat pressures 23 am 2/13 Target driving pressure less than 15 cm of water Target PaO2 55-65: titrate PEEP/FiO2 per protocol Proning not required      Continue steroids at 40 mg q12     E coli UTI - completed rx with unasyn 2/12  per flowsheet   SBO/ileus, resolved History of ulcerative colitis. Presented also with gastroenteritis and increased stool volumes on presentation >> continue tf   CAD s/p DES, HTN, HLD. Hyponatremia. -  Na 136 2/13    DM type 2 poorly controlled with steroid induced hyperglycemia. Hyper glycemia secondary to steroids COVID glycemic protocol Goal CBG 140-180 Increased  Lantus  Top 35 units twice daily on 2/13 ? Some of his excess uop may be osmotic > also continue SSI    Agitation Fentanyl  / precedex and propofol as of am 2/13 so increased clonazepam per FT to 2 mg qid as well trial of seroquel 100 mg bid starting 2/13 am   >>>  Goal RASS zero to -1 but also trying to avoid vent asynchrony due to barotrauma  Circulatory shock, not present on admit  - still on levophed as of  2/13 and cvp low so needs vol exp with NS and better hyperglycemia control as uop is way up over past few  days   Best practice (evaluated daily)  Diet: TF DVT prophylaxis: lovenox GI prophylaxis: protonix Mobility: SBR Disposition: ICU Family:  Pending  Code Status: full   The patient is critically ill with multiple organ systems failure and requires high complexity decision making for assessment and support, frequent evaluation and titration of therapies, application of advanced monitoring technologies and extensive interpretation of multiple databases. Critical Care Time devoted to patient care services described in this note is 45 minutes.    Christinia Gully, MD Pulmonary and [place] [phone]   After 7:00 pm call Elink  [phone]

## 2020-04-03 NOTE — Progress Notes (Signed)
eLink Physician-Brief Progress Note Patient Name: Calvin Cruz DOB: 1960-12-18 MRN: [id number]   Date of Service  04/03/2020  HPI/Events of Note  Dyssynchrony with ventilator. Double-stacking breaths. Not sufficiently sedated despite versed 9mg /hr, fentanyl 400 mcg/hr, and Precedex @ 1.2.   eICU Interventions  Ordered propofol infusion for additional sedation. Last TGs 200.  Once more adequately sedated and synchronous with ventilator, RN to wean Versed as tolerated.     Intervention Category Major Interventions: Respiratory failure - evaluation and management  Charlott Rakes 04/03/2020, 3:44 AM

## 2020-04-04 ENCOUNTER — Inpatient Hospital Stay (HOSPITAL_COMMUNITY): Payer: Commercial Managed Care - PPO

## 2020-04-04 DIAGNOSIS — K56600 Partial intestinal obstruction, unspecified as to cause: Secondary | ICD-10-CM | POA: Diagnosis not present

## 2020-04-04 LAB — CBC
HCT: 36.4 % — ABNORMAL LOW (ref 39.0–52.0)
Hemoglobin: 12.2 g/dL — ABNORMAL LOW (ref 13.0–17.0)
MCH: 31.4 pg (ref 26.0–34.0)
MCHC: 33.5 g/dL (ref 30.0–36.0)
MCV: 93.8 fL (ref 80.0–100.0)
Platelets: 205 10*3/uL (ref 150–400)
RBC: 3.88 MIL/uL — ABNORMAL LOW (ref 4.22–5.81)
RDW: 12.8 % (ref 11.5–15.5)
WBC: 10.2 10*3/uL (ref 4.0–10.5)
nRBC: 0 % (ref 0.0–0.2)

## 2020-04-04 LAB — BASIC METABOLIC PANEL
Anion gap: 9 (ref 5–15)
BUN: 19 mg/dL (ref 8–23)
CO2: 34 mmol/L — ABNORMAL HIGH (ref 22–32)
Calcium: 8.3 mg/dL — ABNORMAL LOW (ref 8.9–10.3)
Chloride: 96 mmol/L — ABNORMAL LOW (ref 98–111)
Creatinine, Ser: 0.38 mg/dL — ABNORMAL LOW (ref 0.61–1.24)
GFR, Estimated: >60 mL/min (ref >60)
Glucose, Bld: 287 mg/dL — ABNORMAL HIGH (ref 70–99)
Potassium: 4.4 mmol/L (ref 3.5–5.1)
Sodium: 139 mmol/L (ref 135–145)

## 2020-04-04 LAB — GLUCOSE, CAPILLARY
Glucose-Capillary: 238 mg/dL — ABNORMAL HIGH (ref 70–99)
Glucose-Capillary: 248 mg/dL — ABNORMAL HIGH (ref 70–99)
Glucose-Capillary: 272 mg/dL — ABNORMAL HIGH (ref 70–99)
Glucose-Capillary: 305 mg/dL — ABNORMAL HIGH (ref 70–99)
Glucose-Capillary: 310 mg/dL — ABNORMAL HIGH (ref 70–99)
Glucose-Capillary: 419 mg/dL — ABNORMAL HIGH (ref 70–99)

## 2020-04-04 LAB — MAGNESIUM: Magnesium: 2.2 mg/dL (ref 1.7–2.4)

## 2020-04-04 LAB — TRIGLYCERIDES: Triglycerides: 182 mg/dL — ABNORMAL HIGH (ref <150)

## 2020-04-04 LAB — PROCALCITONIN: Procalcitonin: <0.1 ng/mL

## 2020-04-04 MED ORDER — SODIUM CHLORIDE 0.9 % IV SOLN
2.0000 g | Freq: Three times a day (TID) | INTRAVENOUS | Status: AC
  Administered 2020-04-04 – 2020-04-10 (×20): 2 g via INTRAVENOUS
  Filled 2020-04-04 (×20): qty 2

## 2020-04-04 MED ORDER — METOPROLOL TARTRATE 5 MG/5ML IV SOLN
5.0000 mg | Freq: Once | INTRAVENOUS | Status: AC
  Administered 2020-04-04: 5 mg via INTRAVENOUS
  Filled 2020-04-04: qty 5

## 2020-04-04 MED ORDER — VALPROIC ACID 250 MG/5ML PO SOLN
250.0000 mg | Freq: Four times a day (QID) | ORAL | Status: DC
  Administered 2020-04-04 – 2020-04-14 (×43): 250 mg
  Filled 2020-04-04 (×46): qty 5

## 2020-04-04 MED ORDER — VANCOMYCIN HCL 2000 MG/400ML IV SOLN
2000.0000 mg | Freq: Once | INTRAVENOUS | Status: AC
  Administered 2020-04-04: 2000 mg via INTRAVENOUS
  Filled 2020-04-04: qty 400

## 2020-04-04 MED ORDER — INSULIN ASPART 100 UNIT/ML ~~LOC~~ SOLN
7.0000 [IU] | SUBCUTANEOUS | Status: DC
  Administered 2020-04-04 – 2020-04-05 (×6): 7 [IU] via SUBCUTANEOUS

## 2020-04-04 MED ORDER — PHENYLEPHRINE HCL-NACL 10-0.9 MG/250ML-% IV SOLN
0.0000 ug/min | INTRAVENOUS | Status: DC
  Administered 2020-04-04: 90 ug/min via INTRAVENOUS
  Administered 2020-04-04: 13.333 ug/min via INTRAVENOUS
  Administered 2020-04-04: 20 ug/min via INTRAVENOUS
  Administered 2020-04-04: 220 ug/min via INTRAVENOUS
  Administered 2020-04-04: 90 ug/min via INTRAVENOUS
  Administered 2020-04-05 (×2): 120 ug/min via INTRAVENOUS
  Administered 2020-04-05: 180 ug/min via INTRAVENOUS
  Administered 2020-04-05: 120 ug/min via INTRAVENOUS
  Administered 2020-04-05: 100 ug/min via INTRAVENOUS
  Administered 2020-04-05: 120 ug/min via INTRAVENOUS
  Administered 2020-04-05: 100 ug/min via INTRAVENOUS
  Administered 2020-04-05 (×2): 120 ug/min via INTRAVENOUS
  Administered 2020-04-05: 100 ug/min via INTRAVENOUS
  Filled 2020-04-04 (×15): qty 250

## 2020-04-04 MED ORDER — MIDAZOLAM HCL 2 MG/2ML IJ SOLN
5.0000 mg | Freq: Once | INTRAMUSCULAR | Status: AC
  Administered 2020-04-04: 5 mg via INTRAVENOUS

## 2020-04-04 MED ORDER — CHLORDIAZEPOXIDE HCL 25 MG PO CAPS
25.0000 mg | ORAL_CAPSULE | Freq: Four times a day (QID) | ORAL | Status: DC
  Administered 2020-04-04 – 2020-04-14 (×43): 25 mg
  Filled 2020-04-04 (×43): qty 1

## 2020-04-04 MED ORDER — METOPROLOL TARTRATE 25 MG/10 ML ORAL SUSPENSION
50.0000 mg | Freq: Two times a day (BID) | ORAL | Status: DC
  Administered 2020-04-04: 50 mg
  Filled 2020-04-04: qty 20

## 2020-04-04 MED ORDER — MIDAZOLAM HCL 2 MG/2ML IJ SOLN
INTRAMUSCULAR | Status: AC
  Filled 2020-04-04: qty 6

## 2020-04-04 MED ORDER — VANCOMYCIN HCL 1750 MG/350ML IV SOLN
1750.0000 mg | Freq: Two times a day (BID) | INTRAVENOUS | Status: DC
  Administered 2020-04-05 – 2020-04-06 (×3): 1750 mg via INTRAVENOUS
  Filled 2020-04-04 (×3): qty 350

## 2020-04-04 MED ORDER — ACETAMINOPHEN 160 MG/5ML PO SOLN
650.0000 mg | ORAL | Status: DC | PRN
  Administered 2020-04-10 – 2020-04-14 (×13): 650 mg
  Filled 2020-04-04 (×13): qty 20.3

## 2020-04-04 MED ORDER — ALBUMIN HUMAN 25 % IV SOLN
12.5000 g | Freq: Once | INTRAVENOUS | Status: AC
  Administered 2020-04-04: 12.5 g via INTRAVENOUS
  Filled 2020-04-04: qty 50

## 2020-04-04 MED ORDER — MIDODRINE HCL 5 MG PO TABS
10.0000 mg | ORAL_TABLET | Freq: Three times a day (TID) | ORAL | Status: DC
  Administered 2020-04-04 – 2020-04-06 (×8): 10 mg
  Filled 2020-04-04 (×8): qty 2

## 2020-04-04 MED ORDER — METOPROLOL TARTRATE 25 MG/10 ML ORAL SUSPENSION
50.0000 mg | Freq: Two times a day (BID) | ORAL | Status: DC
  Administered 2020-04-04: 50 mg
  Filled 2020-04-04 (×2): qty 20

## 2020-04-04 MED ORDER — PREDNISONE 20 MG PO TABS
30.0000 mg | ORAL_TABLET | Freq: Every day | ORAL | Status: DC
  Administered 2020-04-04 – 2020-04-07 (×4): 30 mg
  Filled 2020-04-04 (×4): qty 1

## 2020-04-04 NOTE — TOC Progression Note (Signed)
Transition of Care [facility]) - Progression Note    Patient Details  Name: Calvin Cruz MRN: [id number] Date of Birth: [birth year]  Transition of Care [facility]) CM/SW Contact  Leeroy Cha, RN Phone Number: 04/04/2020, 8:30 AM  Clinical Narrative:    60 year old never vaccinated for covid with ARDS secondary to COVID-19 pneumonia, small bowel obstruction/ileus Intubated on 2/5  and transferred to [facility]  Past Medical History:  Ulcerative colitis s/p colectomy, Gout, CAD s/p DES, HTN, HLD, Nephrolithiasis, DM, Erythrocytosis  Significant Hospital Events:  1/15 Admit to [facility] 1/18 Endoscopic insertion of NG tube 1/20 start remdesivir, steroids 1/24 needing high flow oxygen and NRB 2/01 start TPN 2/5 Intubated and tx to [facility] 2/6 Stop TPN, start tube feeds he has an OG tube 2/8 Weaning benzos, started Precedex 2/10 pneumo-mediastinum, dropped tidal volume 540 cc PLAN-UNABLE TO DETERMINE AT THIS TIME DUE TO CONDITION MAY NEED LTACH CONSIDERATION.     Barriers to Discharge: Continued Medical Work up  Expected Discharge Plan and Services                                                 Social Determinants of Health (SDOH) Interventions    Readmission Risk Interventions No flowsheet data found.

## 2020-04-04 NOTE — Progress Notes (Addendum)
NAME:  DHAIRYA CORALES, MRN:  [id number], DOB:  20-Nov-1960, LOS: 85 ADMISSION DATE:  03/04/2020, CONSULTATION DATE:  03/21/2020 REFERRING MD:  Dr. Manuella Ghazi, [facility], CHIEF COMPLAINT:  Short of breath   Brief History:  60 year old never vaccinated for covid with ARDS secondary to COVID-19 pneumonia, small bowel obstruction/ileus Intubated on 2/5  and transferred to [facility] long hospital  Past Medical History:  Ulcerative colitis s/p colectomy, Gout, CAD s/p DES, HTN, HLD, Nephrolithiasis, DM, Erythrocytosis  Significant Hospital Events:  1/15 Admit to [facility] 1/18 Endoscopic insertion of NG tube 1/20 start remdesivir, steroids 1/24 needing high flow oxygen and NRB 2/01 start TPN 2/5 Intubated and tx to [facility] 2/6 Stop TPN, start tube feeds he has an OG tube 2/8 Weaning benzos, started Precedex 2/10 pneumo-mediastinum, dropped tidal volume 540 cc2/10 pneumomediastinum/subcutaneous emphysema due to barotrauma 2/13 dysynchronous with vent over night/ placed on propofol by elink around  4am but not breathing over vent on am rounds / still on low doses of levophed. 2/13 so increased clonazepam per FT to 2 mg qid as well trial of seroquel 100 mg bid starting 2/13 am  2/14 changed BIS goal to -2 stopped precedex. Solumedrol changed to pred.    Consults:  Gastroenterology s/o 1/28 Surgery s/o 1/19  Procedures:  Rt PICC 2/01 >>   Significant Diagnostic Tests:   CT abd/pelvis 1/16 >> lower lungs clear, mildly dilated air and fluid-filled loops of ileum measuring up to 3.2 cm. No clear transition point or focal wall narrowing  CT angio chest 1/28 >> diffuse b/l airspace opacity consistent with COVID 19 pneumonia  CT chest WO 2/10 >> diffuse pneumo mediastinum and subcutaneous emphysema, extensive bilateral groundglass disease  Micro Data:  COVID PCR 1/15 >> Positive Flu PCR 1/15 >> negative Urine culture 2/2-pansensitive E. coli  Antimicrobials:  Remdesivir 1/20 >> 1/24 Steroids 1/20 >>   Baricitinib 1/27 >> 2/6 Unasyn 2/5 >>  2/12   Interim History / Subjective:  No issues overnight   Objective   Blood pressure (Abnormal) 102/59, pulse (Abnormal) 113, temperature 99.68 F (37.6 C), resp. rate (Abnormal) 23, height 6\' 6"  (1.981 m), weight 87.7 kg, SpO2 94 %. CVP:  [9 mmHg] 9 mmHg  Vent Mode: PRVC FiO2 (%):  [55 %-75 %] 60 % Set Rate:  [26 bmp] 26 bmp Vt Set:  [540 mL] 540 mL PEEP:  [8 cmH20] 8 cmH20 Plateau Pressure:  [21 cmH20-32 cmH20] 29 cmH20   Intake/Output Summary (Last 24 hours) at 04/04/2020 0812 Last data filed at 04/04/2020 1448 Gross per 24 hour  Intake 4177.41 ml  Output 4975 ml  Net -797.59 ml   Filed Weights   03/28/20 0504 04/01/20 0500 04/02/20 0500  Weight: 87.7 kg 89.3 kg 87.7 kg   CVP:  [9 mmHg] 9 mmHg    Examination:  General this is a 60 year old WM sedated on vent/ currently 50 mcg prop and 1/2 precedex and fent 300  HENT NCAT orally intubated. MM dry no JVD Pulm scattered rhonchi. FIO2 60/peep 8 RR 26 Vt 540 pplat 26 sats 94%  Card RRR  abd soft tol tube feeds. + bowel sounds Ext warm/edema both ankles. Has SCD in place. Pulses are warm and refill cap brisk Neuro ->sedation as above. 50-70. Heavily sedated  GU cl yellow via [facility] Problem list   Presented also with gastroenteritis and increased stool volumes on presentation-->resolved SBO/ileus-->resolved  E coli UTI-->treated and resolved. (completed Rx 2/12) Hyponatremia  Assessment & Plan:   Acute Hypoxic Respiratory failure w/ ARDS from COVID 19 pneumonia. -pcxr 2/14: Portable chest x-ray personally reviewed: Demonstrates the endotracheal tube in satisfactory position.  There is a right PICC line in place.  There are bilateral hazy infiltrates, aeration about the same Plan: Cont low VT ventilation  Titrate PEEP/fio2 for sats >88% pplat goal <30/driving pressure goal <15 Assess CVP to see if room to diurese Taper steroids-->has been on this dose  since 2/9 VAP bundle PAD protocol ->RASS goal -3 Will probably need trach mid week     Circulatory shock (not present on admit) As of 2/14 seems to be mix of volume depletion and sedating meds (sedating meds likely the larger factor) Plan Keep euvolemic Titrate Norepi for MAP > 65 Add Midodrine    Acute Metabolic encephalopathy/ agitated delirium  -the precedex doesn't seem to be adding much here if still needing prop.  BIS suggests he is heavily sedated.  Plan Dc precedex Cont prop and fent Cont Klonopin Cont Seroquel.  Added librium and valproic acid Change RASS goal to -2 to -3   He may require trach to help facilitate weaning  Adding valproic acid   History of ulcerative colitis. Plan monitor  H/o CAD s/p DES (2016), HTN, HLD. Plan Holding antihypertensives  Have been holding his Brilenta (had been on maintenance dosing) Will cont to hold for now given likelihood he will require trach Cont tele   DM type 2 poorly controlled with steroid induced hyperglycemia. Hyper glycemia secondary to steroids--.still w/ fairly poor glycemic control, exacerbated by steroids.  Plan Cont resistant ssi Cont lantus 35 u q 12 Add TF coverage. 7 units q 4   Best practice (evaluated daily)  Diet: TF DVT prophylaxis: lovenox GI prophylaxis: protonix Mobility: SBR Disposition: ICU Family:  Pending  Code Status: full  Lines/tubes->has PICC and foley-->remove foley 2/14  My cct 34 min  Erick Colace ACNP-BC [place] Pager # [phone] OR # [phone] if no answer

## 2020-04-04 NOTE — Progress Notes (Signed)
[facility] Progress Note Patient Name: CARON TARDIF DOB: 10-Apr-1960 MRN: [id number]   Date of Service  04/04/2020  HPI/Events of Note  Multiple issues: 1 Urinary retention - bladder scan reveals 410 mL residual. 2. Lopressor given with SBP in 80's and patient now hypotensive. BP = 64/48. Albumin = 2.6. Nurse titrated Phenylephrine IV infusion up.   eICU Interventions  Plan: 1. I/O cath PRN. 2. Lopressor 50 mg per tube BID. Hold dose for SBP < 100 or HR < 60.  3. 25% Albumin 12.5 gm IV now.      Intervention Category Major Interventions: Other:  Lysle Dingwall 04/04/2020, 11:26 PM

## 2020-04-04 NOTE — Progress Notes (Signed)
Pharmacy Antibiotic Note  Calvin Cruz is a 60 y.o. male admitted on 03/02/2020 with partial small bowel obstruction and COVID 19 infection.  Transferred to ICU on 2/5.    Today, patient developed a fever and has become more tachycardic along with increasing oxygen requirements concerning for HCAP.  Pharmacy has been consulted for cefepime and vancomycin dosing.  Plan: Cefepime 2 g IV every 8 hours Vancomycin 2 g IV x 1 then vancomycin 1750 mg IV every 12 hours (Goal AUC 400-550, calculated AUC 531.8, Serum creatinine used=rounded to 0.8) Monitor clinical picture, renal function, vancomycin levels if indicated F/U C&S, abx deescalation / LOT   Height: 6\' 6"  (198.1 cm) Weight: 87.7 kg (193 lb 5.5 oz) IBW/kg (Calculated) : 91.4  Temp (24hrs), Avg:99.6 F (37.6 C), Min:98.42 F (36.9 C), Max:101.66 F (38.7 C)  Recent Labs  Lab 03/30/20 0439 03/31/20 0444 04/01/20 0431 04/02/20 0409 04/03/20 0543 04/04/20 0536  WBC 9.6 9.3 10.5  --  14.5* 10.2  CREATININE 0.38* 0.41* 0.41* 0.36* 0.36* 0.38*    Estimated Creatinine Clearance: 120.3 mL/min (A) (by C-G formula based on SCr of 0.38 mg/dL (L)).    Allergies  Allergen Reactions  . Metronidazole Itching    Unknown  . Ciprofloxacin Itching and Nausea And Vomiting  . Dexilant [Dexlansoprazole] Diarrhea  . Morphine And Related Itching  . Doxycycline Itching, Other (See Comments) and Rash    Redness of skin, burning sensation Unknown     Antimicrobials this admission: Remdesivir 1/20 >> 1/24 Baricitinib 1/27 >> 1/30  2/3>>2/6- DC'd out of window Ceftriaxone 2/5>> x 1 Unasyn 2/5 >> 2/12 2/14 cefepime >> 2/14 vancomycin >>  Microbiology results: 2/2 Urine CX: E. Coli >100K CFU/ml. Pan sens 2/5 MRSA - 1/15 covid + 2/14 TA: pending   Thank you for allowing pharmacy to be a part of this patient's care.  Efraim Kaufmann, PharmD, BCPS 04/04/2020 5:52 PM

## 2020-04-05 ENCOUNTER — Inpatient Hospital Stay (HOSPITAL_COMMUNITY): Payer: Commercial Managed Care - PPO

## 2020-04-05 DIAGNOSIS — K56600 Partial intestinal obstruction, unspecified as to cause: Secondary | ICD-10-CM | POA: Diagnosis not present

## 2020-04-05 LAB — COMPREHENSIVE METABOLIC PANEL
ALT: 42 U/L (ref 0–44)
AST: 22 U/L (ref 15–41)
Albumin: 2.4 g/dL — ABNORMAL LOW (ref 3.5–5.0)
Alkaline Phosphatase: 45 U/L (ref 38–126)
Anion gap: 8 (ref 5–15)
BUN: 28 mg/dL — ABNORMAL HIGH (ref 8–23)
CO2: 29 mmol/L (ref 22–32)
Calcium: 8.3 mg/dL — ABNORMAL LOW (ref 8.9–10.3)
Chloride: 101 mmol/L (ref 98–111)
Creatinine, Ser: 0.5 mg/dL — ABNORMAL LOW (ref 0.61–1.24)
GFR, Estimated: >60 mL/min (ref >60)
Glucose, Bld: 219 mg/dL — ABNORMAL HIGH (ref 70–99)
Potassium: 3.8 mmol/L (ref 3.5–5.1)
Sodium: 138 mmol/L (ref 135–145)
Total Bilirubin: 0.5 mg/dL (ref 0.3–1.2)
Total Protein: 5.1 g/dL — ABNORMAL LOW (ref 6.5–8.1)

## 2020-04-05 LAB — GLUCOSE, CAPILLARY
Glucose-Capillary: 105 mg/dL — ABNORMAL HIGH (ref 70–99)
Glucose-Capillary: 110 mg/dL — ABNORMAL HIGH (ref 70–99)
Glucose-Capillary: 125 mg/dL — ABNORMAL HIGH (ref 70–99)
Glucose-Capillary: 135 mg/dL — ABNORMAL HIGH (ref 70–99)
Glucose-Capillary: 190 mg/dL — ABNORMAL HIGH (ref 70–99)
Glucose-Capillary: 220 mg/dL — ABNORMAL HIGH (ref 70–99)
Glucose-Capillary: 99 mg/dL (ref 70–99)

## 2020-04-05 LAB — PROCALCITONIN: Procalcitonin: 3.9 ng/mL

## 2020-04-05 MED ORDER — DEXTROSE IN LACTATED RINGERS 5 % IV SOLN: INTRAVENOUS | Status: DC

## 2020-04-05 MED ORDER — PHENYLEPHRINE CONCENTRATED 100MG/250ML (0.4 MG/ML) INFUSION SIMPLE
0.0000 ug/min | INTRAVENOUS | Status: DC
  Administered 2020-04-05: 100 ug/min via INTRAVENOUS
  Administered 2020-04-06: 190 ug/min via INTRAVENOUS
  Administered 2020-04-06: 260 ug/min via INTRAVENOUS
  Administered 2020-04-06: 300 ug/min via INTRAVENOUS
  Administered 2020-04-07: 90 ug/min via INTRAVENOUS
  Administered 2020-04-08: 20 ug/min via INTRAVENOUS
  Filled 2020-04-05 (×5): qty 250

## 2020-04-05 MED ORDER — INSULIN GLARGINE 100 UNIT/ML ~~LOC~~ SOLN
25.0000 [IU] | Freq: Two times a day (BID) | SUBCUTANEOUS | Status: DC
  Administered 2020-04-05 (×2): 25 [IU] via SUBCUTANEOUS
  Filled 2020-04-05 (×4): qty 0.25

## 2020-04-05 MED ORDER — METOCLOPRAMIDE HCL 5 MG/ML IJ SOLN
10.0000 mg | Freq: Four times a day (QID) | INTRAMUSCULAR | Status: DC
  Administered 2020-04-05 – 2020-04-08 (×13): 10 mg via INTRAVENOUS
  Filled 2020-04-05 (×13): qty 2

## 2020-04-05 MED ORDER — VECURONIUM BROMIDE 10 MG IV SOLR: 0.1000 mg/kg | INTRAVENOUS | Status: DC | PRN

## 2020-04-05 MED ORDER — BETHANECHOL CHLORIDE 25 MG PO TABS
25.0000 mg | ORAL_TABLET | Freq: Three times a day (TID) | ORAL | Status: DC
  Administered 2020-04-05 – 2020-04-08 (×12): 25 mg
  Filled 2020-04-05 (×13): qty 1

## 2020-04-05 MED FILL — Sodium Chloride IV Soln 0.9%: INTRAVENOUS | Qty: 250 | Status: AC

## 2020-04-05 MED FILL — Phenylephrine HCl IV Soln 10 MG/ML: INTRAVENOUS | Qty: 10 | Status: AC

## 2020-04-05 NOTE — Progress Notes (Signed)
NAME:  Calvin Cruz, MRN:  [id number], DOB:  [birth year], LOS: 58 ADMISSION DATE:  02/25/2020, CONSULTATION DATE:  03/21/2020 REFERRING MD:  Dr. Manuella Ghazi, [facility], CHIEF COMPLAINT:  Short of breath   Brief History:  60 year old never vaccinated for covid with ARDS secondary to COVID-19 pneumonia, small bowel obstruction/ileus Intubated on 2/5  and transferred to [facility] long hospital  Past Medical History:  Ulcerative colitis s/p colectomy, Gout, CAD s/p DES, HTN, HLD, Nephrolithiasis, DM, Erythrocytosis  Significant Hospital Events:  1/15 Admit to [facility] 1/18 Endoscopic insertion of NG tube 1/20 start remdesivir, steroids 1/24 needing high flow oxygen and NRB 2/01 start TPN 2/5 Intubated and tx to [facility] 2/6 Stop TPN, start tube feeds he has an OG tube 2/8 Weaning benzos, started Precedex 2/10 pneumo-mediastinum, dropped tidal volume 540 cc2/10 pneumomediastinum/subcutaneous emphysema due to barotrauma 2/13 dysynchronous with vent over night/ placed on propofol by elink around  4am but not breathing over vent on am rounds / still on low doses of levophed. 2/13 so increased clonazepam per FT to 2 mg qid as well trial of seroquel 100 mg bid starting 2/13 am  2/14 changed BIS goal to -2 stopped precedex. Added librium and valproic acid. Solumedrol changed to pred. Spiked fever over the day. Got more tachycardic, O2 requirement increased. Sputum culture sent. vanc and cefepime started empirically for HCAP.  2/15 peep requirements up. Recurrent septic shock 2/2 HCAP. On neo as norepi exacerbated tachycardia further. abd more distended c/w recurrent ileus. Had urinary retention foley replaced. Started on urecholine.    Consults:  Gastroenterology s/o 1/28 Surgery s/o 1/19  Procedures:  Rt PICC 2/01 >>   Significant Diagnostic Tests:   CT abd/pelvis 1/16 >> lower lungs clear, mildly dilated air and fluid-filled loops of ileum measuring up to 3.2 cm. No clear transition point or focal wall  narrowing  CT angio chest 1/28 >> diffuse b/l airspace opacity consistent with COVID 19 pneumonia  CT chest WO 2/10 >> diffuse pneumo mediastinum and subcutaneous emphysema, extensive bilateral groundglass disease  Micro Data:  COVID PCR 1/15 >> Positive Flu PCR 1/15 >> negative Urine culture 2/2-pansensitive E. coli  Antimicrobials:  Remdesivir 1/20 >> 1/24 Steroids 1/20 >>  Baricitinib 1/27 >> 2/6 Unasyn 2/5 >>  2/12 Cefepime 2/14>>> vanc 2/14>>>  Interim History / Subjective:  Febrile. abd more distended.  Still on pressors   Objective   Blood pressure 113/70, pulse (Abnormal) 124, temperature 99 F (37.2 C), resp. rate (Abnormal) 24, height 6\' 6"  (1.981 m), weight 87.7 kg, SpO2 92 %. CVP:  [8 mmHg] 8 mmHg  Vent Mode: PRVC FiO2 (%):  [60 %-90 %] 90 % Set Rate:  [26 bmp] 26 bmp Vt Set:  [540 mL] 540 mL PEEP:  [8 cmH20] 8 cmH20 Plateau Pressure:  [20 cmH20-33 cmH20] 31 cmH20   Intake/Output Summary (Last 24 hours) at 04/05/2020 0813 Last data filed at 04/05/2020 0600 Gross per 24 hour  Intake 7759.69 ml  Output 900 ml  Net 6859.69 ml   Filed Weights   03/28/20 0504 04/01/20 0500 04/02/20 0500  Weight: 87.7 kg 89.3 kg 87.7 kg   CVP:  [8 mmHg] 8 mmHg    Examination:  General critically ill appearing white male. Sedated on vent. FIO2/peep needs up. Febrile yesterday and today. Currently on 120 mcgs of neo. Abd more distended.  HENT orally intubated. Temporal wasting MMM Pulm + accessory use. Decreased breath sounds. Crackles left base. Settings: 90% rr 26, he is breathing over (rr would  be higher but not strong enough to trigger), peep 12 fio2 90 Pplat 28. sats 91% Card tachy RRR abd distended w/ hypoactive bowel sounds and tympanic percussion GU + urinary residual required I&O last evening .  Ext warm pulses strong. No sig edema  Neuro heavily sedated. Still on fent infusion 350 mcgs/prop at [facility] Problem list   Presented also with  gastroenteritis and increased stool volumes on presentation-->resolved SBO/ileus-->resolved  E coli UTI-->treated and resolved. (completed Rx 2/12) Hyponatremia  Assessment & Plan:   Acute Hypoxic Respiratory failure w/ ARDS from COVID 19 pneumonia; complicated by HCAP 8/58  His oxygen/peep and WOB all look worse Plan: Cont low VT ventilation Pplat goal <30 Sat goal>88 PAD protocol -->RASS goal -3 to -4 Hold lasix today d/t shock state Day 2 vanc and cefepime-->f/u pending cultures Cont pred at 30mg /vt daily  cxr now    Septic shock 2/2 HCAP Initially his hypotension was likely medication related. Now has evolving HCAP Plan Titrate Neo for SBP > 100 Ck CVP and shoot for goal 8-12 Cont midodrine Cont IVFs-->change to LR from NS Hold diuresis   Acute Metabolic encephalopathy/ agitated delirium  -the precedex doesn't seem to be adding much here if still needing prop.  BIS suggests he is heavily sedated.  Plan Cont prop and fent No change in Klonopin, librium or valproic acid. His BIS is -4 but probably needs to be here given worsening pulm exam   History of ulcerative colitis. W/ recurrent SBO (2/2 h/o adhesions) -abd more distended.  Plan 1 view abd NPO today    H/o CAD s/p DES (2016), HTN, HLD. Plan Holding antihypertensives Holding his maintenance Brilenta Cont tele    DM type 2 poorly controlled with steroid induced hyperglycemia. Still w/ poor control. Will be holding tube feeds so  May see his glucose drop even more  Plan Cont resistant scale Cont lantus 35 units bid -->will decrease to 25 bid and may need to stop this all together  Stop tubefeed coverage   Best practice (evaluated daily)  Diet: TF-->palced on hold d/t recurrent ileus  DVT prophylaxis: lovenox GI prophylaxis: protonix Mobility: SBR Disposition: ICU Family:  Discussed daily Code Status: full  Lines/tubes->has PICC and foley-->remove foley 2/14-->replacing 2/15 due to retention. Added  Urecholine 2/15 My cct 1 min  Calvin Cruz ACNP-BC [place] Pager # [phone] OR # [phone] if no answer

## 2020-04-06 ENCOUNTER — Inpatient Hospital Stay (HOSPITAL_COMMUNITY): Payer: Commercial Managed Care - PPO

## 2020-04-06 DIAGNOSIS — I351 Nonrheumatic aortic (valve) insufficiency: Secondary | ICD-10-CM | POA: Diagnosis not present

## 2020-04-06 DIAGNOSIS — R9431 Abnormal electrocardiogram [ECG] [EKG]: Secondary | ICD-10-CM | POA: Diagnosis not present

## 2020-04-06 DIAGNOSIS — K56600 Partial intestinal obstruction, unspecified as to cause: Secondary | ICD-10-CM | POA: Diagnosis not present

## 2020-04-06 LAB — COMPREHENSIVE METABOLIC PANEL
ALT: 28 U/L (ref 0–44)
AST: 19 U/L (ref 15–41)
Albumin: 2 g/dL — ABNORMAL LOW (ref 3.5–5.0)
Alkaline Phosphatase: 38 U/L (ref 38–126)
Anion gap: 8 (ref 5–15)
BUN: 15 mg/dL (ref 8–23)
CO2: 30 mmol/L (ref 22–32)
Calcium: 8.4 mg/dL — ABNORMAL LOW (ref 8.9–10.3)
Chloride: 102 mmol/L (ref 98–111)
Creatinine, Ser: 0.37 mg/dL — ABNORMAL LOW (ref 0.61–1.24)
GFR, Estimated: >60 mL/min (ref >60)
Glucose, Bld: 260 mg/dL — ABNORMAL HIGH (ref 70–99)
Potassium: 3.8 mmol/L (ref 3.5–5.1)
Sodium: 140 mmol/L (ref 135–145)
Total Bilirubin: 0.5 mg/dL (ref 0.3–1.2)
Total Protein: 4.8 g/dL — ABNORMAL LOW (ref 6.5–8.1)

## 2020-04-06 LAB — ECHOCARDIOGRAM LIMITED
AV Mean grad: 9 mmHg
AV Peak grad: 18.1 mmHg
Ao pk vel: 2.13 m/s
Area-P 1/2: 2.95 cm2
Height: 78 in
Weight: 3319.25 oz

## 2020-04-06 LAB — APTT: aPTT: 34 seconds (ref 24–36)

## 2020-04-06 LAB — BASIC METABOLIC PANEL
Anion gap: 8 (ref 5–15)
BUN: 12 mg/dL (ref 8–23)
CO2: 32 mmol/L (ref 22–32)
Calcium: 8 mg/dL — ABNORMAL LOW (ref 8.9–10.3)
Chloride: 102 mmol/L (ref 98–111)
Creatinine, Ser: 0.31 mg/dL — ABNORMAL LOW (ref 0.61–1.24)
GFR, Estimated: >60 mL/min (ref >60)
Glucose, Bld: 104 mg/dL — ABNORMAL HIGH (ref 70–99)
Potassium: 3.7 mmol/L (ref 3.5–5.1)
Sodium: 142 mmol/L (ref 135–145)

## 2020-04-06 LAB — PHOSPHORUS: Phosphorus: 2.3 mg/dL — ABNORMAL LOW (ref 2.5–4.6)

## 2020-04-06 LAB — GLUCOSE, CAPILLARY
Glucose-Capillary: 86 mg/dL (ref 70–99)
Glucose-Capillary: 61 mg/dL — ABNORMAL LOW (ref 70–99)
Glucose-Capillary: 103 mg/dL — ABNORMAL HIGH (ref 70–99)
Glucose-Capillary: 106 mg/dL — ABNORMAL HIGH (ref 70–99)
Glucose-Capillary: 115 mg/dL — ABNORMAL HIGH (ref 70–99)
Glucose-Capillary: 82 mg/dL (ref 70–99)
Glucose-Capillary: 91 mg/dL (ref 70–99)

## 2020-04-06 LAB — TROPONIN I (HIGH SENSITIVITY)
Troponin I (High Sensitivity): 14 ng/L (ref <18)
Troponin I (High Sensitivity): 15 ng/L (ref <18)
Troponin I (High Sensitivity): 16 ng/L (ref <18)
Troponin I (High Sensitivity): 15 ng/L (ref <18)

## 2020-04-06 LAB — CULTURE, RESPIRATORY W GRAM STAIN: Culture: NORMAL

## 2020-04-06 LAB — HEPARIN LEVEL (UNFRACTIONATED): Heparin Unfractionated: 0.12 IU/mL — ABNORMAL LOW (ref 0.30–0.70)

## 2020-04-06 LAB — PROTIME-INR
INR: 1.1 (ref 0.8–1.2)
Prothrombin Time: 14 seconds (ref 11.4–15.2)

## 2020-04-06 LAB — URINE CULTURE: Culture: NO GROWTH

## 2020-04-06 LAB — PROCALCITONIN: Procalcitonin: 1.72 ng/mL

## 2020-04-06 LAB — MAGNESIUM: Magnesium: 2.1 mg/dL (ref 1.7–2.4)

## 2020-04-06 LAB — GLUCOSE, RANDOM: Glucose, Bld: 86 mg/dL (ref 70–99)

## 2020-04-06 MED ORDER — MAGNESIUM SULFATE 2 GM/50ML IV SOLN
2.0000 g | Freq: Once | INTRAVENOUS | Status: AC
  Administered 2020-04-06: 2 g via INTRAVENOUS
  Filled 2020-04-06: qty 50

## 2020-04-06 MED ORDER — AMIODARONE HCL IN DEXTROSE 360-4.14 MG/200ML-% IV SOLN
INTRAVENOUS | Status: AC
  Filled 2020-04-06: qty 200

## 2020-04-06 MED ORDER — AMIODARONE HCL IN DEXTROSE 360-4.14 MG/200ML-% IV SOLN
INTRAVENOUS | Status: AC
  Administered 2020-04-06: 60 mg/h via INTRAVENOUS
  Filled 2020-04-06: qty 200

## 2020-04-06 MED ORDER — AMIODARONE LOAD VIA INFUSION
150.0000 mg | Freq: Once | INTRAVENOUS | Status: AC
  Administered 2020-04-06: 150 mg via INTRAVENOUS

## 2020-04-06 MED ORDER — HEPARIN (PORCINE) 25000 UT/250ML-% IV SOLN
2000.0000 [IU]/h | INTRAVENOUS | Status: DC
  Administered 2020-04-06 – 2020-04-07 (×2): 1600 [IU]/h via INTRAVENOUS
  Administered 2020-04-07 – 2020-04-08 (×3): 1800 [IU]/h via INTRAVENOUS
  Administered 2020-04-09 – 2020-04-13 (×8): 2000 [IU]/h via INTRAVENOUS
  Filled 2020-04-06 (×13): qty 250

## 2020-04-06 MED ORDER — AMIODARONE HCL IN DEXTROSE 360-4.14 MG/200ML-% IV SOLN
30.0000 mg/h | INTRAVENOUS | Status: DC
  Administered 2020-04-06 – 2020-04-14 (×16): 30 mg/h via INTRAVENOUS
  Filled 2020-04-06 (×18): qty 200

## 2020-04-06 MED ORDER — HEPARIN (PORCINE) 25000 UT/250ML-% IV SOLN
1400.0000 [IU]/h | INTRAVENOUS | Status: DC
  Administered 2020-04-06: 1400 [IU]/h via INTRAVENOUS
  Filled 2020-04-06: qty 250

## 2020-04-06 MED ORDER — METOPROLOL TARTRATE 25 MG/10 ML ORAL SUSPENSION
50.0000 mg | Freq: Two times a day (BID) | ORAL | Status: DC
  Administered 2020-04-06 (×2): 50 mg
  Filled 2020-04-06 (×3): qty 20

## 2020-04-06 MED ORDER — METOPROLOL TARTRATE 5 MG/5ML IV SOLN
5.0000 mg | INTRAVENOUS | Status: AC | PRN
  Administered 2020-04-07 (×3): 5 mg via INTRAVENOUS
  Filled 2020-04-06 (×3): qty 5

## 2020-04-06 MED ORDER — HEPARIN BOLUS VIA INFUSION
2500.0000 [IU] | Freq: Once | INTRAVENOUS | Status: AC
  Administered 2020-04-06: 2500 [IU] via INTRAVENOUS
  Filled 2020-04-06: qty 2500

## 2020-04-06 MED ORDER — METOPROLOL TARTRATE 5 MG/5ML IV SOLN
INTRAVENOUS | Status: AC
  Administered 2020-04-06: 5 mg
  Filled 2020-04-06: qty 5

## 2020-04-06 MED ORDER — MIDAZOLAM HCL 2 MG/2ML IJ SOLN
INTRAMUSCULAR | Status: AC
  Administered 2020-04-06: 4 mg
  Filled 2020-04-06: qty 4

## 2020-04-06 MED ORDER — AMIODARONE HCL IN DEXTROSE 360-4.14 MG/200ML-% IV SOLN
60.0000 mg/h | INTRAVENOUS | Status: AC
Start: 2020-04-06 — End: 2020-04-06

## 2020-04-06 MED ORDER — INSULIN GLARGINE 100 UNIT/ML ~~LOC~~ SOLN
15.0000 [IU] | Freq: Two times a day (BID) | SUBCUTANEOUS | Status: DC
  Administered 2020-04-06: 15 [IU] via SUBCUTANEOUS
  Filled 2020-04-06 (×3): qty 0.15

## 2020-04-06 MED ORDER — ASPIRIN 325 MG PO TABS
325.0000 mg | ORAL_TABLET | Freq: Every day | ORAL | Status: DC
  Administered 2020-04-06 – 2020-04-14 (×9): 325 mg
  Filled 2020-04-06 (×9): qty 1

## 2020-04-06 NOTE — Progress Notes (Addendum)
Called E-link and spoke to Elgie Congo, RN about telemetry alerts for prolonged QTc in this patient who is on Amiodarone drip and Seroquel via tube. Advised patient does not appear in distress, but writer wanted to ensure that this change was communicated in case meds need to be adjusted. Elzie Rings will report to Dr. Lucile Shutters. No changes made at this time. Continuing to monitor patient.  2333: 5 beats of VTach per telemetry technician. Reported as well.  0011: scheduled EKG performed and showed normal sinus rhythm. Placed on chart.

## 2020-04-06 NOTE — Progress Notes (Signed)
NAME:  OZIAS DICENZO, MRN:  [id number], DOB:  1960-11-30, LOS: 27 ADMISSION DATE:  03/07/2020, CONSULTATION DATE:  03/21/2020 REFERRING MD:  Dr. Manuella Ghazi, [facility], CHIEF COMPLAINT:  Short of breath   Brief History:  60 year old never vaccinated for covid with ARDS secondary to COVID-19 pneumonia, small bowel obstruction/ileus Intubated on 2/5  and transferred to [facility] long hospital  Past Medical History:  Ulcerative colitis s/p colectomy, Gout, CAD s/p DES, HTN, HLD, Nephrolithiasis, DM, Erythrocytosis  Significant Hospital Events:  1/15 Admit to [facility] 1/18 Endoscopic insertion of NG tube 1/20 start remdesivir, steroids 1/24 needing high flow oxygen and NRB 2/01 start TPN 2/5 Intubated and tx to [facility] 2/6 Stop TPN, start tube feeds he has an OG tube 2/8 Weaning benzos, started Precedex 2/10 pneumo-mediastinum, dropped tidal volume 540 cc2/10 pneumomediastinum/subcutaneous emphysema due to barotrauma 2/13 dysynchronous with vent over night/ placed on propofol by elink around  4am but not breathing over vent on am rounds / still on low doses of levophed. 2/13 so increased clonazepam per FT to 2 mg qid as well trial of seroquel 100 mg bid starting 2/13 am  2/14 changed BIS goal to -2 stopped precedex. Added librium and valproic acid. Solumedrol changed to pred. Spiked fever over the day. Got more tachycardic, O2 requirement increased. Sputum culture sent. vanc and cefepime started empirically for HCAP.  2/15 peep requirements up. Recurrent septic shock 2/2 HCAP. On neo as norepi exacerbated tachycardia further. abd more distended c/w recurrent ileus. Had urinary retention foley replaced. Started on urecholine.  2/16 better,PEEP/FIO2 needs down, tachycardia resolved. Ileus better changing FT to gravity from LIWS. Stopping vanc. Starting to wean fent first.   Consults:  Gastroenterology s/o 1/28 Surgery s/o 1/19  Procedures:  Rt PICC 2/01 >>   Significant Diagnostic Tests:   CT abd/pelvis  1/16 >> lower lungs clear, mildly dilated air and fluid-filled loops of ileum measuring up to 3.2 cm. No clear transition point or focal wall narrowing  CT angio chest 1/28 >> diffuse b/l airspace opacity consistent with COVID 19 pneumonia  CT chest WO 2/10 >> diffuse pneumo mediastinum and subcutaneous emphysema, extensive bilateral groundglass disease  Micro Data:  COVID PCR 1/15 >> Positive Flu PCR 1/15 >> negative Urine culture 2/2-pansensitive E. coli  Antimicrobials:  Remdesivir 1/20 >> 1/24 Steroids 1/20 >>  Baricitinib 1/27 >> 2/6 Unasyn 2/5 >>  2/12 Cefepime 2/14>>> vanc 2/14>>>  Interim History / Subjective:  Looks remarkably better today!  Objective   Blood pressure 112/61, pulse 61, temperature 97.7 F (36.5 C), temperature source Axillary, resp. rate (Abnormal) 26, height 6\' 6"  (1.981 m), weight 94.1 kg, SpO2 100 %. CVP:  [1 mmHg-11 mmHg] 2 mmHg  Vent Mode: PRVC FiO2 (%):  [80 %-90 %] 80 % Set Rate:  [26 bmp] 26 bmp Vt Set:  [540 mL] 540 mL PEEP:  [12 cmH20] 12 cmH20 Plateau Pressure:  [32 cmH20-41 cmH20] 35 cmH20   Intake/Output Summary (Last 24 hours) at 04/06/2020 0820 Last data filed at 04/06/2020 0640 Gross per 24 hour  Intake 5683.67 ml  Output 4550 ml  Net 1133.67 ml   Filed Weights   04/02/20 0500 04/05/20 1220 04/06/20 0412  Weight: 87.7 kg 99.9 kg 94.1 kg   CVP:  [1 mmHg-11 mmHg] 2 mmHg    Examination:  General sedated on prop and fent. Overall looks much better today HENT NCAT does have some temporal wasting orally intubated Pulm dec both bases. WOB improved. Decreased PEEP to 10 and FIO2  to 70% PPlat 29; sats staying at 97% Card Now NSR RRR no MRG . HR now 60s Abd now much softer + bowel sounds OGT w/ bilious output. Put out ~ 600 since placed on LIWS Ext warm and dry trace edema in exts Neuro heavily sedated; BIS 41 GU foley cl yellow    Resolved Hospital Problem list   Presented also with gastroenteritis and increased stool volumes  on presentation-->resolved SBO/ileus-->resolved  E coli UTI-->treated and resolved. (completed Rx 2/12) Hyponatremia  Assessment & Plan:   Acute Hypoxic Respiratory failure w/ ARDS from COVID 19 pneumonia; complicated by HCAP 1/10  PCXR personally reviewed. Bilateral airspace disease persists. Aeration looks a little better on the left but all and all no sig change Has had sig improvement in O2 and peep requirements; also vent mechanics and sedation needs much improved.  Sputum grew NOF Plan: Cont low Vt ventilation  Weaning PEEP/fio2 sat goal >88% pplat goal < 30 PAD protocol-->one more day of PAD goal -3 to -4; can hopefully reduce this goal tomorrow assuming vent requirements continue to improve.  Dc vanc; day 3 of 7 cefepime Cont pred 30  Avoid abd distention  Am cxr  Septic shock 2/2 HCAP Initially his hypotension was likely medication related. Now has evolving HCAP-->overall SIRS parameters improved. PCT better. Still pressor dependent but some of this may be sedation related again His Sinus tachycardia has resolved.  Plan Cont to titrate neo for SBP > 100 Dec IVFs to 50cc/hr-->CVP only 1 and not getting tube feeds abx as above Cont tele   Acute Metabolic encephalopathy/ agitated delirium  Plan Cont current prop and fent Cont Klonopin, valproic acid and librium RASS goal -3 to -4, I will ask RN to preferentially dec fent first as SBO and ileus is a recurrent theme for him   History of ulcerative colitis. W/ recurrent SBO (2/2 h/o adhesions) -abd more distended on 2/15; stopped tubefeeds and placed FT to LIWS. His abd looks remarkably better Still has gas pattern on 1 view c/w obstruction  Plan Change NGT to gravity  Cont to hold TF another day  Rpt am 1 view abd  Cont reglan    H/o CAD s/p DES (2016), HTN, HLD. Plan Holding antihypertensives, holding his Luna Kitchens (this was a maintenance dose) Cont tele    DM type 2 poorly controlled with steroid induced  hyperglycemia. Still w/ poor control. Will be holding tube feeds so  May see his glucose drop even more  Plan Cont resistant scale ssi Dec lantus to 15 bid given holding TF    Best practice (evaluated daily)  Diet: TF-->palced on hold d/t recurrent ileus  DVT prophylaxis: lovenox GI prophylaxis: protonix Mobility: SBR Disposition: ICU Family:  Discussed daily Code Status: full  Lines/tubes->has PICC and foley-->remove foley 2/14-->replaced 2/15 due to retention. Added Urecholine 2/15 My cct 31 min  Erick Colace ACNP-BC [place] Pager # [phone] OR # [phone] if no answer

## 2020-04-06 NOTE — Progress Notes (Signed)
  Amiodarone Drug - Drug Interaction Consult Note  Recommendations: Risk for QTc prolongation with combination of amiodarone and quetiapine which may be difficult to monitor if in atrial fibrillation. Also, patient is on topical lidocaine which might theoretically increase the risk for arrhythmogenic effect of amiodaron. Would recommend close monitoring as well as maintaining K > 4 and Mg > 2.   Amiodarone is metabolized by the cytochrome P450 system and therefore has the potential to cause many drug interactions. Amiodarone has an average plasma half-life of 50 days (range 20 to 100 days).   There is potential for drug interactions to occur several weeks or months after stopping treatment and the onset of drug interactions may be slow after initiating amiodarone.   []  Statins: Increased risk of myopathy. Simvastatin- restrict dose to 20mg  daily. Other statins: counsel patients to report any muscle pain or weakness immediately.  []  Anticoagulants: Amiodarone can increase anticoagulant effect. Consider warfarin dose reduction. Patients should be monitored closely and the dose of anticoagulant altered accordingly, remembering that amiodarone levels take several weeks to stabilize.  []  Antiepileptics: Amiodarone can increase plasma concentration of phenytoin, the dose should be reduced. Note that small changes in phenytoin dose can result in large changes in levels. Monitor patient and counsel on signs of toxicity.  []  Beta blockers: increased risk of bradycardia, AV block and myocardial depression. Sotalol - avoid concomitant use.  []   Calcium channel blockers (diltiazem and verapamil): increased risk of bradycardia, AV block and myocardial depression.  []   Cyclosporine: Amiodarone increases levels of cyclosporine. Reduced dose of cyclosporine is recommended.  []  Digoxin dose should be halved when amiodarone is started.  []  Diuretics: increased risk of cardiotoxicity if hypokalemia  occurs.  []  Oral hypoglycemic agents (glyburide, glipizide, glimepiride): increased risk of hypoglycemia. Patient's glucose levels should be monitored closely when initiating amiodarone therapy.   [x]  Drugs that prolong the QT interval:  Torsades de pointes risk may be increased with concurrent use - avoid if possible.  Monitor QTc, also keep magnesium/potassium WNL if concurrent therapy can't be avoided. Mar[place] Kitchen Antibiotics: e.g. fluoroquinolones, erythromycin. . Antiarrhythmics: e.g. quinidine, procainamide, disopyramide, sotalol. . Antipsychotics: e.g. phenothiazines, haloperidol.  . Lithium, tricyclic antidepressants, and methadone. Thank You,  Ulice Dash D  04/06/2020 1:08 PM

## 2020-04-06 NOTE — Progress Notes (Signed)
1146 - patient went into afib with RVR. MD and NP called to bedside. Orders for Amiodarone bolus and gtt given and started. Heparin gtt orders given. Patients heart rate sustained in the 180's. After multiple bedside EKG's and medications, cardioversion was performed three times with Dr. Erin Fulling and Marni Griffon, NP at bedside giving orders. Patient returned to sinus tach with a rate of 120's after final cardioversion.   1600- Wife at bedside and updated by Dr. Erin Fulling.

## 2020-04-06 NOTE — Progress Notes (Signed)
[place] for Heparin Infusion Indication: atrial fibrillation  Allergies  Allergen Reactions  . Metronidazole Itching    Unknown  . Ciprofloxacin Itching and Nausea And Vomiting  . Dexilant [Dexlansoprazole] Diarrhea  . Morphine And Related Itching  . Doxycycline Itching, Other (See Comments) and Rash    Redness of skin, burning sensation Unknown     Patient Measurements: Height: 6\' 6"  (198.1 cm) Weight: 94.1 kg (207 lb 7.3 oz) IBW/kg (Calculated) : 91.4 Heparin Dosing Weight: 94 kg  Vital Signs: Temp: 97.7 F (36.5 C) (02/16 0800) Temp Source: Axillary (02/16 0800) BP: 129/59 (02/16 1640) Pulse Rate: 73 (02/16 1640)  Labs: Recent Labs    04/04/20 0536 04/05/20 0356 04/06/20 0428 04/06/20 1222 04/06/20 1230 04/06/20 1645  HGB 12.2*  --   --   --   --   --   HCT 36.4*  --   --   --   --   --   PLT 205  --   --   --   --   --   APTT  --   --   --  34  --   --   LABPROT  --   --   --  14.0  --   --   INR  --   --   --  1.1  --   --   HEPARINUNFRC  --   --   --   --   --  0.12*  CREATININE 0.38* 0.50* 0.37*  --   --   --   TROPONINIHS  --   --   --   --  14 15    Estimated Creatinine Clearance: 125.4 mL/min (A) (by C-G formula based on SCr of 0.37 mg/dL (L)).  Assessment: 60 y/o M admitted with respiratory failure due to COVID-19 PNA developed atrial fibrillation with rapid ventricular response. Pharmacy consulted to initiate heparin infusion. Last dose of prophylaxis Lovenox was last PM.   04/06/2020 6:12 PM  Heparin level 0.12 (drawn slightly early, ~5hrs after starting), subtherapeutic  No issues reported with IV infusion  No bleeding reported  Goal of Therapy:  Heparin level 0.3-0.7 units/ml Monitor platelets by anticoagulation protocol: Yes   Plan:  Re-bolus with 2500 units x 1  Increase heparin infusion to 1600 units/hr Check anti-Xa level in 6 hours and daily while on heparin Continue to monitor H&H and  platelets  Peggyann Juba, PharmD, BCPS Pharmacy: [phone] 04/06/2020,6:12 PM

## 2020-04-06 NOTE — TOC Progression Note (Signed)
Transition of Care [facility]) - Progression Note    Patient Details  Name: Calvin Cruz MRN: [id number] Date of Birth: February 19, 1961  [facility]) CM/SW Contact  Leeroy Cha, RN Phone Number: 04/06/2020, 8:56 AM  Clinical Narrative:    Patient remains critically ill due to acute hypoxemic respiratory failure secondary to covid 19 pneumonia. He remains on mechanical ventilation with increasing needs at this time with an FiO2 of 90%. We are covering for HCAP with vancomycin and cefepime. We will place the OG tube to low intermittent suction for the new ileus and TF have been held. Will add intermittent paralytics as needed for his respiratory status and vent synchrony. We will maintain a lung protective strategy with goal plateau pressures below 30 cmH2O.     Overall, patient's prognosis is very poor as his respiratory status is declining.       plan: unable to determine to instability of condition.     Barriers to Discharge: Continued Medical Work up  Expected Discharge Plan and Services                                                 Social Determinants of Health (SDOH) Interventions    Readmission Risk Interventions No flowsheet data found.

## 2020-04-06 NOTE — Progress Notes (Signed)
  Echocardiogram 2D Echocardiogram has been performed.  Jennette Dubin 04/06/2020, 2:35 PM

## 2020-04-06 NOTE — Progress Notes (Signed)
[place] for Heparin Infusion Indication: atrial fibrillation  Allergies  Allergen Reactions  . Metronidazole Itching    Unknown  . Ciprofloxacin Itching and Nausea And Vomiting  . Dexilant [Dexlansoprazole] Diarrhea  . Morphine And Related Itching  . Doxycycline Itching, Other (See Comments) and Rash    Redness of skin, burning sensation Unknown     Patient Measurements: Height: 6\' 6"  (198.1 cm) Weight: 94.1 kg (207 lb 7.3 oz) IBW/kg (Calculated) : 91.4 Heparin Dosing Weight: 94 kg  Vital Signs: Temp: 97.7 F (36.5 C) (02/16 0800) Temp Source: Axillary (02/16 0800) BP: 161/64 (02/16 1000) Pulse Rate: 95 (02/16 1000)  Labs: Recent Labs    04/04/20 0536 04/05/20 0356 04/06/20 0428  HGB 12.2*  --   --   HCT 36.4*  --   --   PLT 205  --   --   CREATININE 0.38* 0.50* 0.37*    Estimated Creatinine Clearance: 125.4 mL/min (A) (by C-G formula based on SCr of 0.37 mg/dL (L)).   Medical History: Past Medical History:  Diagnosis Date  . CAD S/P DES PCI-circumflex 07/22/2014   PCI mCx: PCI: 2.75 x 18 mm Xience Alpine DES to mid LCx at takeoff of OM1 (OM1 jailed 50% stenosis)  . Diabetes ([place])   . Erythrocytosis 11/28/2012  . Essential hypertension   . Fatigue 11/28/2012  . Gout 2014  . Hyperlipidemia with target LDL less than 70   . Hypertriglyceridemia 11/16/2014  . Kidney stones   . Non-STEMI (non-ST elevated myocardial infarction) ([place]) 07/22/2014   a) RCA: OK, LM: Okay ; LAD: Proximal to mid 40%; RI: 30%;LCx: Mid to distal 90%, distal 20%, OM1 50% --> DES PCI  . Ulcerative colitis    s/p ilioanal anastomosis (colectomy) ; has had multiple episodes of pouchitis.    Assessment: 60 y/o M admitted with respiratory failure due to COVID-19 PNA developed atrial fibrillation with rapid ventricular response. Pharmacy consulted to initiate heparin infusion. Last dose of prophylaxis Lovenox was last PM.   Goal of Therapy:  Heparin  level 0.3-0.7 units/ml Monitor platelets by anticoagulation protocol: Yes   Plan:  Give 2500 units bolus x 1 (half bolus due to recent Lovenox) Start heparin infusion at 1400 units/hr Check anti-Xa level in 6 hours and daily while on heparin Continue to monitor H&H and platelets  Ulice Dash D 04/06/2020,12:27 PM

## 2020-04-06 NOTE — Progress Notes (Signed)
Redrew serum glucose level because it did not correlate with recent capillary glucose.

## 2020-04-07 DIAGNOSIS — K56600 Partial intestinal obstruction, unspecified as to cause: Secondary | ICD-10-CM | POA: Diagnosis not present

## 2020-04-07 DIAGNOSIS — J80 Acute respiratory distress syndrome: Secondary | ICD-10-CM | POA: Diagnosis not present

## 2020-04-07 DIAGNOSIS — Z7189 Other specified counseling: Secondary | ICD-10-CM

## 2020-04-07 DIAGNOSIS — Y95 Nosocomial condition: Secondary | ICD-10-CM

## 2020-04-07 DIAGNOSIS — U071 COVID-19: Secondary | ICD-10-CM | POA: Diagnosis not present

## 2020-04-07 DIAGNOSIS — I471 Supraventricular tachycardia: Secondary | ICD-10-CM

## 2020-04-07 DIAGNOSIS — Z515 Encounter for palliative care: Secondary | ICD-10-CM

## 2020-04-07 DIAGNOSIS — J189 Pneumonia, unspecified organism: Secondary | ICD-10-CM

## 2020-04-07 LAB — TRIGLYCERIDES: Triglycerides: 155 mg/dL — ABNORMAL HIGH (ref <150)

## 2020-04-07 LAB — PHOSPHORUS
Phosphorus: 1.8 mg/dL — ABNORMAL LOW (ref 2.5–4.6)
Phosphorus: 3.1 mg/dL (ref 2.5–4.6)

## 2020-04-07 LAB — COMPREHENSIVE METABOLIC PANEL
ALT: 25 U/L (ref 0–44)
AST: 25 U/L (ref 15–41)
Albumin: 2 g/dL — ABNORMAL LOW (ref 3.5–5.0)
Alkaline Phosphatase: 40 U/L (ref 38–126)
Anion gap: 9 (ref 5–15)
BUN: 11 mg/dL (ref 8–23)
CO2: 30 mmol/L (ref 22–32)
Calcium: 8 mg/dL — ABNORMAL LOW (ref 8.9–10.3)
Chloride: 101 mmol/L (ref 98–111)
Creatinine, Ser: 0.32 mg/dL — ABNORMAL LOW (ref 0.61–1.24)
GFR, Estimated: >60 mL/min (ref >60)
Glucose, Bld: 96 mg/dL (ref 70–99)
Potassium: 3.7 mmol/L (ref 3.5–5.1)
Sodium: 140 mmol/L (ref 135–145)
Total Bilirubin: 0.6 mg/dL (ref 0.3–1.2)
Total Protein: 5 g/dL — ABNORMAL LOW (ref 6.5–8.1)

## 2020-04-07 LAB — CBC
HCT: 28.3 % — ABNORMAL LOW (ref 39.0–52.0)
Hemoglobin: 9.2 g/dL — ABNORMAL LOW (ref 13.0–17.0)
MCH: 31.8 pg (ref 26.0–34.0)
MCHC: 32.5 g/dL (ref 30.0–36.0)
MCV: 97.9 fL (ref 80.0–100.0)
Platelets: 187 10*3/uL (ref 150–400)
RBC: 2.89 MIL/uL — ABNORMAL LOW (ref 4.22–5.81)
RDW: 14.1 % (ref 11.5–15.5)
WBC: 5.7 10*3/uL (ref 4.0–10.5)
nRBC: 0 % (ref 0.0–0.2)

## 2020-04-07 LAB — BASIC METABOLIC PANEL
Anion gap: 6 (ref 5–15)
BUN: 11 mg/dL (ref 8–23)
CO2: 35 mmol/L — ABNORMAL HIGH (ref 22–32)
Calcium: 8 mg/dL — ABNORMAL LOW (ref 8.9–10.3)
Chloride: 99 mmol/L (ref 98–111)
Creatinine, Ser: 0.38 mg/dL — ABNORMAL LOW (ref 0.61–1.24)
GFR, Estimated: >60 mL/min (ref >60)
Glucose, Bld: 96 mg/dL (ref 70–99)
Potassium: 3.1 mmol/L — ABNORMAL LOW (ref 3.5–5.1)
Sodium: 140 mmol/L (ref 135–145)

## 2020-04-07 LAB — GLUCOSE, CAPILLARY
Glucose-Capillary: 104 mg/dL — ABNORMAL HIGH (ref 70–99)
Glucose-Capillary: 60 mg/dL — ABNORMAL LOW (ref 70–99)
Glucose-Capillary: 63 mg/dL — ABNORMAL LOW (ref 70–99)
Glucose-Capillary: 98 mg/dL (ref 70–99)
Glucose-Capillary: 94 mg/dL (ref 70–99)
Glucose-Capillary: 99 mg/dL (ref 70–99)
Glucose-Capillary: 107 mg/dL — ABNORMAL HIGH (ref 70–99)

## 2020-04-07 LAB — MAGNESIUM
Magnesium: 2 mg/dL (ref 1.7–2.4)
Magnesium: 2 mg/dL (ref 1.7–2.4)
Magnesium: 1.9 mg/dL (ref 1.7–2.4)

## 2020-04-07 LAB — HEPARIN LEVEL (UNFRACTIONATED)
Heparin Unfractionated: 0.21 IU/mL — ABNORMAL LOW (ref 0.30–0.70)
Heparin Unfractionated: 0.34 IU/mL (ref 0.30–0.70)
Heparin Unfractionated: 0.35 IU/mL (ref 0.30–0.70)

## 2020-04-07 MED ORDER — SODIUM CHLORIDE 0.9 % IV SOLN: INTRAVENOUS | Status: DC | PRN

## 2020-04-07 MED ORDER — PIVOT 1.5 CAL PO LIQD
1000.0000 mL | ORAL | Status: DC
Start: 2020-04-07 — End: 2020-04-07
  Filled 2020-04-07: qty 1000

## 2020-04-07 MED ORDER — METOPROLOL TARTRATE 25 MG/10 ML ORAL SUSPENSION
100.0000 mg | Freq: Two times a day (BID) | ORAL | Status: DC
  Administered 2020-04-07 (×2): 100 mg
  Filled 2020-04-07 (×4): qty 40

## 2020-04-07 MED ORDER — DEXTROSE 50 % IV SOLN
25.0000 mL | Freq: Once | INTRAVENOUS | Status: AC
  Administered 2020-04-07: 25 mL via INTRAVENOUS

## 2020-04-07 MED ORDER — HYDRALAZINE HCL 20 MG/ML IJ SOLN
INTRAMUSCULAR | Status: AC
  Administered 2020-04-07: 20 mg via INTRAVENOUS
  Filled 2020-04-07: qty 1

## 2020-04-07 MED ORDER — DEXTROSE 50 % IV SOLN
INTRAVENOUS | Status: AC
  Administered 2020-04-07: 12.5 g via INTRAVENOUS
  Filled 2020-04-07: qty 50

## 2020-04-07 MED ORDER — HYDRALAZINE HCL 20 MG/ML IJ SOLN: 10.0000 mg | INTRAMUSCULAR | Status: DC | PRN

## 2020-04-07 MED ORDER — PIVOT 1.5 CAL PO LIQD
1000.0000 mL | ORAL | Status: DC
  Administered 2020-04-07: 1000 mL
  Filled 2020-04-07 (×2): qty 1000

## 2020-04-07 MED ORDER — EZETIMIBE 10 MG PO TABS
10.0000 mg | ORAL_TABLET | Freq: Every day | ORAL | Status: DC
  Administered 2020-04-07 – 2020-04-14 (×8): 10 mg
  Filled 2020-04-07 (×9): qty 1

## 2020-04-07 MED ORDER — FREE WATER
100.0000 mL | Status: DC
  Administered 2020-04-07 – 2020-04-15 (×46): 100 mL

## 2020-04-07 MED ORDER — ROSUVASTATIN CALCIUM 10 MG PO TABS
10.0000 mg | ORAL_TABLET | Freq: Every day | ORAL | Status: DC
  Administered 2020-04-07 – 2020-04-14 (×8): 10 mg
  Filled 2020-04-07 (×8): qty 1

## 2020-04-07 MED ORDER — POTASSIUM CHLORIDE 20 MEQ PO PACK
40.0000 meq | PACK | Freq: Three times a day (TID) | ORAL | Status: AC
  Administered 2020-04-07 – 2020-04-08 (×3): 40 meq
  Filled 2020-04-07 (×4): qty 2

## 2020-04-07 MED ORDER — FUROSEMIDE 10 MG/ML IJ SOLN
40.0000 mg | Freq: Three times a day (TID) | INTRAMUSCULAR | Status: DC
  Administered 2020-04-07 – 2020-04-08 (×3): 40 mg via INTRAVENOUS
  Filled 2020-04-07 (×3): qty 4

## 2020-04-07 MED ORDER — PROSOURCE TF PO LIQD
45.0000 mL | Freq: Two times a day (BID) | ORAL | Status: DC
  Administered 2020-04-07 – 2020-04-08 (×3): 45 mL
  Filled 2020-04-07 (×3): qty 45

## 2020-04-07 MED ORDER — DEXTROSE 50 % IV SOLN: 12.5000 g | INTRAVENOUS | Status: AC

## 2020-04-07 MED ORDER — MIDAZOLAM HCL 2 MG/2ML IJ SOLN
2.0000 mg | INTRAMUSCULAR | Status: AC | PRN
  Administered 2020-04-07: 2 mg via INTRAVENOUS
  Filled 2020-04-07: qty 2

## 2020-04-07 MED ORDER — HEPARIN BOLUS VIA INFUSION
1500.0000 [IU] | Freq: Once | INTRAVENOUS | Status: AC
  Administered 2020-04-07: 1500 [IU] via INTRAVENOUS
  Filled 2020-04-07: qty 1500

## 2020-04-07 MED ORDER — VITAL HIGH PROTEIN PO LIQD: 1000.0000 mL | ORAL | Status: DC

## 2020-04-07 MED ORDER — METOPROLOL TARTRATE 5 MG/5ML IV SOLN
INTRAVENOUS | Status: AC
  Administered 2020-04-07: 5 mg via INTRAVENOUS
  Filled 2020-04-07: qty 10

## 2020-04-07 MED ORDER — METOPROLOL TARTRATE 5 MG/5ML IV SOLN: 10.0000 mg | Freq: Once | INTRAVENOUS | Status: AC

## 2020-04-07 NOTE — Progress Notes (Signed)
[place] for Heparin Infusion Indication: atrial fibrillation  Allergies  Allergen Reactions  . Metronidazole Itching    Unknown  . Ciprofloxacin Itching and Nausea And Vomiting  . Dexilant [Dexlansoprazole] Diarrhea  . Morphine And Related Itching  . Doxycycline Itching, Other (See Comments) and Rash    Redness of skin, burning sensation Unknown     Patient Measurements: Height: 6\' 6"  (198.1 cm) Weight: 94.1 kg (207 lb 7.3 oz) IBW/kg (Calculated) : 91.4 Heparin Dosing Weight: 94 kg  Vital Signs: Temp: 98.3 F (36.8 C) (02/16 2329) Temp Source: Axillary (02/16 2329) BP: 126/55 (02/16 2330) Pulse Rate: 73 (02/16 2338)  Labs: Recent Labs    04/04/20 0536 04/05/20 0356 04/06/20 0428 04/06/20 1222 04/06/20 1230 04/06/20 1645 04/06/20 1751 04/06/20 2307 04/07/20 0030  HGB 12.2*  --   --   --   --   --   --   --  9.2*  HCT 36.4*  --   --   --   --   --   --   --  28.3*  PLT 205  --   --   --   --   --   --   --  187  APTT  --   --   --  34  --   --   --   --   --   LABPROT  --   --   --  14.0  --   --   --   --   --   INR  --   --   --  1.1  --   --   --   --   --   HEPARINUNFRC  --   --   --   --   --  0.12*  --   --  0.21*  CREATININE 0.38*   < > 0.37*  --   --   --  0.31*  --  0.32*  TROPONINIHS  --   --   --   --    < > 15 16 15   --    < > = values in this interval not displayed.    Estimated Creatinine Clearance: 125.4 mL/min (A) (by C-G formula based on SCr of 0.32 mg/dL (L)).  Assessment: 60 y/o M admitted with respiratory failure due to COVID-19 PNA developed atrial fibrillation with rapid ventricular response. Pharmacy consulted to initiate heparin infusion. Last dose of prophylaxis Lovenox was last PM.   04/07/2020 1:06 AM  Heparin level 0.21, subtherapeutic on IV heparin at 1600 units/hr  No issues reported with IV infusion  No bleeding reported  Goal of Therapy:  Heparin level 0.3-0.7 units/ml Monitor  platelets by anticoagulation protocol: Yes   Plan:  Re-bolus with IV Heparin 1500 units x 1  Increase heparin infusion to 1800 units/hr Check anti-Xa level in 6 hours and daily while on heparin Continue to monitor H&H and platelets  Netta Cedars, PharmD, BCPS Pharmacy: [phone] 04/07/2020,1:06 AM

## 2020-04-07 NOTE — Progress Notes (Signed)
Reported to E-link: 1) hypertension unresolved by three doses of Metoprolol after Phenylephrine stopped. 2) restraints discontinued as patient not awake enough to interfere with tubes or care. 3) blood sugar back to low 100's after 12.5g of D-50 given. 4) EKG's done still show normal sinus rhythm with normal Qtc.  Elink RN/MD will notify writer if any changes need to be made.

## 2020-04-07 NOTE — Progress Notes (Signed)
[place] Progress Note Patient Name: KO BARDON DOB: 24-Jun-1960 MRN: [id number]   Date of Service  04/07/2020  HPI/Events of Note  QT alerts on the monitor, patient is on Amiodarone for Afib with RVR  S/p 3 cardioversion shocks earlier today.  eICU Interventions  Will obtain 12 lead EKG to confirm prolonged QTc, and if prolonged, will discontinue Amiodarone and substitute Cardizem infusion if heart rate rises, will check electrolytes also.        Kerry Kass Ogan 04/07/2020, 12:50 AM

## 2020-04-07 NOTE — Progress Notes (Signed)
[place] Progress Note Patient Name: LAFAYETTE DUNLEVY DOB: [birth year] MRN: [id number]   Date of Service  04/07/2020  HPI/Events of Note  QTc 456  eICU Interventions  Continue Amiodarone infusion, no other intervention at this time.        Kerry Kass Lateesha Bezold 04/07/2020, 1:39 AM

## 2020-04-07 NOTE — Progress Notes (Signed)
[place] Progress Note Patient Name: Calvin Cruz DOB: 19-Jul-1960 MRN: [id number]   Date of Service  04/07/2020  HPI/Events of Note  Patient with elevated blood pressure, he does seem to be a bit light on sedation despite 40 mcg of Propofol + 300 mcg fentanyl infusion, BP currently 190/71.  eICU Interventions  Versed 2 mg iv Q 2 hours PRN added, PRN Hydralazine 10-20 mg iv Q 4 hours prn SBP > 170 mmHg added as well.        Carsen Leaf U Jamion Carter 04/07/2020, 6:18 AM

## 2020-04-07 NOTE — Progress Notes (Signed)
Pharmacy Antibiotic Note  Calvin Cruz is a 60 y.o. male admitted on 03/14/2020 with partial small bowel obstruction and COVID 19 infection.  Transferred to ICU on 2/5.    Patient developed a fever and has become more tachycardic along with increasing oxygen requirements concerning for HCAP.  Pharmacy has been consulted for cefepime and vancomycin dosing.  Today 04/07/20  - SCr stable - Tm 99.7 - WBC wnl  Plan: Continue defepime 2 g IV every 8 hours Day 3/7 Vancomycin d/c F/U C&S, abx deescalation / LOT   Height: 6\' 6"  (198.1 cm) Weight: 95.8 kg (211 lb 3.2 oz) IBW/kg (Calculated) : 91.4  Temp (24hrs), Avg:98.8 F (37.1 C), Min:98.3 F (36.8 C), Max:99.7 F (37.6 C)  Recent Labs  Lab 04/01/20 0431 04/02/20 0409 04/03/20 0543 04/04/20 0536 04/05/20 0356 04/06/20 0428 04/06/20 1751 04/07/20 0030 04/07/20 0823  WBC 10.5  --  14.5* 10.2  --   --   --  5.7  --   CREATININE 0.41*   < > 0.36* 0.38* 0.50* 0.37* 0.31* 0.32* 0.38*   < > = values in this interval not displayed.    Estimated Creatinine Clearance: 125.4 mL/min (A) (by C-G formula based on SCr of 0.38 mg/dL (L)).    Allergies  Allergen Reactions  . Metronidazole Itching    Unknown  . Ciprofloxacin Itching and Nausea And Vomiting  . Dexilant [Dexlansoprazole] Diarrhea  . Morphine And Related Itching  . Doxycycline Itching, Other (See Comments) and Rash    Redness of skin, burning sensation Unknown     Antimicrobials this admission:  Remdesivir 1/20 >> 1/24 Baricitinib 1/27 >> 1/30  2/3>>2/6- DC'd out of window Ceftriaxone 2/5>> x 1 Unasyn 2/5 >> 2/12 2/14 cefepime >> 2/14 vancomycin >> 2/16  Microbiology results:  2/2 Urine CX: E. Coli >100K CFU/ml. Pan sens F 2/5 MRSA - 1/15 covid + 2/14 TA: moderate respiratory flora, no Staph or PsA seen 2/15 UCx: NGF   Thank you for allowing pharmacy to be a part of this patient's care.  Ulice Dash D 04/07/2020 10:03 AM

## 2020-04-07 NOTE — Consult Note (Signed)
Consultation Note Date: 04/07/2020   Patient Name: Calvin Cruz  DOB: [birth year]  MRN: [id number]  Age / Sex: 60 y.o., male   PCP: Reynold Bowen, MD Referring Physician: Freddi Starr, MD   REASON FOR CONSULTATION:Establishing goals of care  Palliative Care consult requested for goals of care discussion in this 60 y.o. male with multiple medical problems including ulcerative colitis, s/p colectomy with Hartman's pouch formation, gout, NSTEMI, CAD s/p DES June 2016, hypertension, and hyperlipidemia. Patient presented to ED with complaints of abdominal pain, nausea, and vomiting. During work-up CT abdomen and pelvis showed partial small bowel obstruction. COVID-19 positive. Unvaccinated. Since admission has received remdesivir and steroids. Required intubation on 2/5 remains on full vent support, new onset a fib w/RVR (200s) requiring cardioversion attempts x3.   Clinical Assessment and Goals of Care: I have reviewed medical records including lab results, imaging, Epic notes, and MAR, and received report from the bedside RN. I spoke with patient's wife via phone to discuss diagnosis prognosis, [place], EOL wishes, disposition and options.  I introduced Palliative Medicine as specialized medical care for people living with serious illness. It focuses on providing relief from the symptoms and stress of a serious illness. The goal is to improve quality of life for both the patient and the family. Mrs. Mckibben verbalized understanding and appreciation.   We discussed a brief life review of the patient, along with his functional and nutritional status. 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 He and his wife have been married over 106 years with 1 son and 2 adopted sons (who are actually their grandchildren). Wife shares patient enjoys spending time with family, doing things around the home, and loves doing work with his hands.   Prior to admission patient  was independent of all ADLs. He was working full-time (13 hours) the day prior to hospital admission. Very active in his home and community. He was the primary caretaker of his family (wife is functionally blind).   We discussed His current illness and what it means in the larger context of His on-going co-morbidities. With specific discussions regarding extensive health challenges with continued requirement of full ventilator support with no ability to wean and cardiac status (a-fib, tachycardia, hypotension).  Natural disease trajectory and expectations at EOL were discussed.  Mrs. Swing verbalized her understanding of patient's current illness. She shares that she is appreciative of updates and is following in the chart to have an either better understanding of how her husband is doing. She shares a mixture of emotions stating she reads the chart and comments says he improves which gives her hope, only to discover that he has not improved as much or have other concerns that now hinders improvement. Emotional support provided. We discussed at length there may be days that he shows tolerating of treatment however his condition remains critical and his organs try to respond to medical treatment however can become distress causing further complications. We discussed taking one step forward unfortunately to take 10 steps back because he is so frail. Wife verbalized understanding. She shares they are Christians with a strong faith that God can heal patient and allow him to recovery. She has a large support form her church members. She spends time sharing a recent conversation with a friend who was on life-support for over 2 months and required facility placement for over a month. She has now recovered and home fully functioning which gives family some additional hope.  I created an opportunity to validate her feelings also emphasizing each individuals medical situation is different and depending on her friend's  medical condition this may have determined her successful outcome. Encouraged her not to use this particular event to compare her husband's ability to do the same. Wife verbalizes understanding.   I attempted to elicit values and goals of care important to the patient. Allowed wife to share feelings and hopes for Mr. Schnyder's improvement.   We discussed at length best case and worst case scenarios. She explains that quality of life meant the most to patient and knows that he would not want his life prolonged or to live in a debilitating state.   I created space and opportunity to further discuss his full code status with consideration to his current illness and co-morbidities. She shares patient's near death experience several years ago after suffering a MI. She also shares experiences with patient's mother who was on life support and was able to survive and expressed wishes of never wanting to encounter that again. She passed away shortly after.Mrs. Baumbach shares she and patient have discussed wishes and she knows he would not want to prolong his life, however would want every opportunity at this point to see if he could survive. If patient is unable to wean, does not feel he would want to live in a facility or state of health. Wife would like to continue with full aggressive/full code at this time.   Mrs. Debois is remaining hopeful but is also aware that preparing for the worst is appropriate. She states one her sons is stationed in [place] Education officer, community) and may need assistance with getting him home in the event patient approaches end-of-life.    Mrs. Lanzo is clear in expressed goals to continue to treat aggressively allowing patient an opportunity to show some improvement. Does not feel (from previous discussions with patient) that he would want to live in a debilitating state or with life-prolonging measures. Wishes to take things one day at a time and continue ongoing discussions with her family.   Questions  and concerns were addressed.  Wife was encouraged to call with questions or concerns.  PMT will continue to support holistically.   SOCIAL HISTORY:     reports that he quit smoking about 18 years ago. His smoking use included cigarettes. He has a 25.00 pack-year smoking history. He has never used smokeless tobacco. He reports that he does not drink alcohol and does not use drugs.  CODE STATUS: Full code  ADVANCE DIRECTIVES: Primary Decision Maker: Wife    SYMPTOM MANAGEMENT: per attending   Palliative Prophylaxis:   Aspiration, Bowel Regimen, Eye Care, Frequent Pain Assessment, Oral Care, Palliative Wound Care and Turn Reposition  PSYCHO-SOCIAL/SPIRITUAL:  Support System: Family  Desire for further Chaplaincy support:NO   Additional Recommendations (Limitations, Scope, Preferences):  Full Scope Treatment  Education on hospice/palliative    PAST MEDICAL HISTORY: Past Medical History:  Diagnosis Date  . CAD S/P DES PCI-circumflex 07/22/2014   PCI mCx: PCI: 2.75 x 18 mm Xience Alpine DES to mid LCx at takeoff of OM1 (OM1 jailed 50% stenosis)  . Diabetes ([place])   . Erythrocytosis 11/28/2012  . Essential hypertension   . Fatigue 11/28/2012  . Gout 2014  . Hyperlipidemia with target LDL less than 70   . Hypertriglyceridemia 11/16/2014  . Kidney stones   . Non-STEMI (non-ST elevated myocardial infarction) ([place]) 07/22/2014   a) RCA: OK, LM: Okay ; LAD: Proximal to mid 40%; RI:  30%;LCx: Mid to distal 90%, distal 20%, OM1 50% --> DES PCI  . Ulcerative colitis    s/p ilioanal anastomosis (colectomy) ; has had multiple episodes of pouchitis.    ALLERGIES:  is allergic to metronidazole, ciprofloxacin, dexilant [dexlansoprazole], morphine and related, and doxycycline.   MEDICATIONS:  Current Facility-Administered Medications  Medication Dose Route Frequency Provider Last Rate Last Admin  . 0.9 %  sodium chloride infusion   Intra-arterial PRN Freddi Starr, MD      .  acetaminophen (TYLENOL) 160 MG/5ML solution 650 mg  650 mg Per Tube Q4H PRN Erick Colace, NP      . amiodarone (NEXTERONE PREMIX) 360-4.14 MG/200ML-% (1.8 mg/mL) IV infusion  30 mg/hr Intravenous Continuous Erick Colace, NP 16.67 mL/hr at 04/07/20 0746 30 mg/hr at 04/07/20 0746  . aspirin tablet 325 mg  325 mg Per Tube Daily Erick Colace, NP   325 mg at 04/07/20 1014  . bethanechol (URECHOLINE) tablet 25 mg  25 mg Per Tube TID Erick Colace, NP   25 mg at 04/07/20 1015  . ceFEPIme (MAXIPIME) 2 g in sodium chloride 0.9 % 100 mL IVPB  2 g Intravenous Q8H Cline, [place]heim, RPH 200 mL/hr at 04/07/20 1430 2 g at 04/07/20 1430  . chlordiazePOXIDE (LIBRIUM) capsule 25 mg  25 mg Per Tube QID Erick Colace, NP   25 mg at 04/07/20 1428  . chlorhexidine gluconate (MEDLINE KIT) (PERIDEX) 0.12 % solution 15 mL  15 mL Mouth Rinse BID Icard, Bradley L, DO   15 mL at 04/07/20 0748  . Chlorhexidine Gluconate Cloth 2 % PADS 6 each  6 each Topical Daily Icard, Bradley L, DO   6 each at 04/07/20 0457  . cholecalciferol (VITAMIN D3) tablet 5,000 Units  5,000 Units Per Tube Daily Lenis Noon, RPH   5,000 Units at 04/07/20 1020  . clonazePAM (KLONOPIN) tablet 2 mg  2 mg Oral QID Tanda Rockers, MD   2 mg at 04/07/20 1428  . diclofenac Sodium (VOLTAREN) 1 % topical gel 4 g  4 g Topical QID Barton Dubois, MD   4 g at 04/07/20 1429  . docusate (COLACE) 50 MG/5ML liquid 100 mg  100 mg Per Tube BID Barton Dubois, MD   100 mg at 04/07/20 1014  . ezetimibe (ZETIA) tablet 10 mg  10 mg Per Tube Daily Erick Colace, NP   10 mg at 04/07/20 1014  . feeding supplement (PIVOT 1.5 CAL) liquid 1,000 mL  1,000 mL Per Tube Q24H Freddi Starr, MD 20 mL/hr at 04/07/20 1123 1,000 mL at 04/07/20 1123  . feeding supplement (PROSource TF) liquid 45 mL  45 mL Per Tube BID Erick Colace, NP   45 mL at 04/07/20 1020  . fentaNYL (SUBLIMAZE) bolus via infusion 25-100 mcg  25-100 mcg Intravenous Q15 min PRN Icard,  Bradley L, DO   100 mcg at 04/07/20 0722  . fentaNYL 255mg in NS 2521m(1041mml) infusion-PREMIX  0-300 mcg/hr Intravenous Continuous BabErick ColaceP 25 mL/hr at 04/07/20 1135 250 mcg/hr at 04/07/20 1135  . free water 100 mL  100 mL Per Tube Q4H BabErick ColaceP   100 mL at 04/07/20 1123  . furosemide (LASIX) injection 40 mg  40 mg Intravenous Q8H BabErick ColaceP   40 mg at 04/07/20 1429  . heparin ADULT infusion 100 units/mL (25000 units/250m52m1,800 Units/hr Intravenous Continuous LillThomes LollingH 18 mL/hr at  04/07/20 1344 1,800 Units/hr at 04/07/20 1344  . hydrALAZINE (APRESOLINE) injection 10-20 mg  10-20 mg Intravenous Q4H PRN Frederik Pear, MD   20 mg at 04/07/20 0641  . insulin aspart (novoLOG) injection 0-20 Units  0-20 Units Subcutaneous Q4H Barton Dubois, MD   3 Units at 04/05/20 1230  . ipratropium-albuterol (DUONEB) 0.5-2.5 (3) MG/3ML nebulizer solution 3 mL  3 mL Nebulization TID Icard, Bradley L, DO   3 mL at 04/07/20 0929  . lidocaine (LIDODERM) 5 % 1 patch  1 patch Transdermal Q24H Barton Dubois, MD   1 patch at 04/07/20 1013  . MEDLINE mouth rinse  15 mL Mouth Rinse 10 times per day Icard, Bradley L, DO   15 mL at 04/07/20 1429  . metoCLOPramide (REGLAN) injection 10 mg  10 mg Intravenous Q6H Erick Colace, NP   10 mg at 04/07/20 1235  . metoprolol tartrate (LOPRESSOR) 25 mg/10 mL oral suspension 100 mg  100 mg Per Tube BID Erick Colace, NP   100 mg at 04/07/20 1025  . midazolam (VERSED) injection 2 mg  2 mg Intravenous Q2H PRN Frederik Pear, MD   2 mg at 04/07/20 9675  . pantoprazole sodium (PROTONIX) 40 mg/20 mL oral suspension 40 mg  40 mg Per Tube BID Lenis Noon, RPH   40 mg at 04/07/20 1013  . phenylephrine CONCENTRATED 110m in sodium chloride 0.9% 2586m(0.22m66mL) infusion  0-400 mcg/min Intravenous Titrated DewFreddi StarrD   Stopped at 04/07/20 0303  . polyethylene glycol (MIRALAX / GLYCOLAX) packet 17 g  17 g Per Tube  Daily MadBarton DuboisD   17 g at 04/07/20 1015  . potassium chloride (KLOR-CON) packet 40 mEq  40 mEq Per Tube Q8H BabErick ColaceP   40 mEq at 04/07/20 1432  . predniSONE (DELTASONE) tablet 30 mg  30 mg Per Tube Daily BabErick ColaceP   30 mg at 04/07/20 1014  . propofol (DIPRIVAN) 1000 MG/100ML infusion  5-50 mcg/kg/min Intravenous Titrated Stretch, RobMarily LenteD 26.3 mL/hr at 04/07/20 1012 50 mcg/kg/min at 04/07/20 1012  . QUEtiapine (SEROQUEL) tablet 100 mg  100 mg Per Tube BID WerTanda RockersD   100 mg at 04/07/20 1015  . rosuvastatin (CRESTOR) tablet 10 mg  10 mg Per Tube Daily BabErick ColaceP   10 mg at 04/07/20 1020  . sodium chloride (OCEAN) 0.65 % nasal spray 1 spray  1 spray Each Nare PRN MadBarton DuboisD      . sodium chloride flush (NS) 0.9 % injection 10-40 mL  10-40 mL Intracatheter Q12H MadBarton DuboisD   10 mL at 04/07/20 1015  . sodium chloride flush (NS) 0.9 % injection 10-40 mL  10-40 mL Intracatheter PRN MadBarton DuboisD      . valproic acid (DEPAKENE) 250 MG/5ML solution 250 mg  250 mg Per Tube QID BabErick ColaceP   250 mg at 04/07/20 1431  . vecuronium (NORCURON) injection 8.8 mg  0.1 mg/kg Intravenous Q4H PRN DewFreddi StarrD        VITAL SIGNS: BP (!) 163/62 (BP Location: Right Leg)   Pulse 92   Temp 99.7 F (37.6 C) (Axillary)   Resp (!) 22   Ht '6\' 6"'  (1.981 m)   Wt 95.8 kg   SpO2 (!) 88%   BMI 24.41 kg/m  Filed Weights   04/05/20 1220 04/06/20 0412 04/07/20 0417  Weight: 99.9 kg 94.1 kg  95.8 kg    Estimated body mass index is 24.41 kg/m as calculated from the following:   Height as of this encounter: '6\' 6"'  (1.981 m).   Weight as of this encounter: 95.8 kg.  LABS: CBC:    Component Value Date/Time   WBC 5.7 04/07/2020 0030   HGB 9.2 (L) 04/07/2020 0030   HGB 16.8 12/12/2016 1218   HCT 28.3 (L) 04/07/2020 0030   HCT 46.7 12/12/2016 1218   PLT 187 04/07/2020 0030   PLT 179 12/12/2016 1218   Comprehensive Metabolic  Panel:    Component Value Date/Time   NA 140 04/07/2020 0823   NA 139 09/10/2016 1141   K 3.1 (L) 04/07/2020 0823   K 4.0 09/10/2016 1141   BUN 11 04/07/2020 0823   BUN 9.5 09/10/2016 1141   CREATININE 0.38 (L) 04/07/2020 0823   CREATININE 0.8 09/10/2016 1141   ALBUMIN 2.0 (L) 04/07/2020 0030   ALBUMIN 5.2 (H) 02/09/2019 0833   ALBUMIN 4.3 09/10/2016 1141     Review of Systems  Unable to perform ROS: Intubated   The above conversation was completed via telephone due to the visitor restrictions during the COVID-19 pandemic. Thorough chart review and discussion with necessary members of the care team was completed as part of assessment. All issues were discussed and addressed but no physical exam was performed.  Prognosis: Extremely Guarded-Poor   Discharge Planning:  To Be Determined  Recommendations: . Full Code-as confirmed by wife . Continue with current plan of care, full scope/full aggressive care . Wife remains hopeful but somewhat preparing for the worst. Strong faith base. Discussed at length quality of life/best case/worst case scenario. States patient would not want to live in a debilitating state with life-prolonging measures.  . Encouraged ongoing family discussions. Marland Kitchen PMT will continue to support and follow. Please call team line with urgent needs.   Palliative Performance Scale: Intubated               Wife expressed understanding and was in agreement with this plan.   Thank you for allowing the Palliative Medicine Team to assist in the care of this patient.  Time In: 1430 Time Out: 1535 Time Total: 65 min.   Visit consisted of counseling and education dealing with the complex and emotionally intense issues of symptom management and palliative care in the setting of serious and potentially life-threatening illness.Greater than 50%  of this time was spent counseling and coordinating care related to the above assessment and plan.  Signed by:  Alda Lea, AGPCNP-BC Palliative Medicine Team  Phone: [phone] Pager: [phone] Amion: Bjorn Pippin

## 2020-04-07 NOTE — Progress Notes (Signed)
[facility] for Heparin Infusion Indication: atrial fibrillation  Allergies  Allergen Reactions  . Metronidazole Itching    Unknown  . Ciprofloxacin Itching and Nausea And Vomiting  . Dexilant [Dexlansoprazole] Diarrhea  . Morphine And Related Itching  . Doxycycline Itching, Other (See Comments) and Rash    Redness of skin, burning sensation Unknown     Patient Measurements: Height: 6\' 6"  (198.1 cm) Weight: 95.8 kg (211 lb 3.2 oz) IBW/kg (Calculated) : 91.4 Heparin Dosing Weight: 94 kg  Vital Signs: Temp: 99.7 F (37.6 C) (02/17 0800) Temp Source: Axillary (02/17 0800) BP: 196/68 (02/17 0900) Pulse Rate: 100 (02/17 0910)  Labs: Recent Labs    04/06/20 1222 04/06/20 1230 04/06/20 1645 04/06/20 1751 04/06/20 2307 04/07/20 0030 04/07/20 0823  HGB  --   --   --   --   --  9.2*  --   HCT  --   --   --   --   --  28.3*  --   PLT  --   --   --   --   --  187  --   APTT 34  --   --   --   --   --   --   LABPROT 14.0  --   --   --   --   --   --   INR 1.1  --   --   --   --   --   --   HEPARINUNFRC  --   --  0.12*  --   --  0.21* 0.34  CREATININE  --   --   --  0.31*  --  0.32* 0.38*  TROPONINIHS  --    < > 15 16 15   --   --    < > = values in this interval not displayed.    Estimated Creatinine Clearance: 125.4 mL/min (A) (by C-G formula based on SCr of 0.38 mg/dL (L)).  Assessment: 60 y/o M admitted with respiratory failure due to COVID-19 PNA developed atrial fibrillation with rapid ventricular response. Pharmacy consulted to initiate heparin infusion. Last dose of prophylaxis Lovenox was last PM.   04/07/2020 9:43 AM  Heparin level 0.34 at goal on heparin infusion at 1800 units/hr  PLT stable but Hgb decreased significantly  No issues reported with IV infusion  No bleeding reported, confirmed with RN  Goal of Therapy:  Heparin level 0.3-0.7 units/ml Monitor platelets by anticoagulation protocol: Yes   Plan:   Continue heparin infusion at 1800 units/hr Check anti-Xa level in 6 hours and daily while on heparin Monitor CBC closely and s/s of bleeding  Ulice Dash D  04/07/2020,9:43 AM

## 2020-04-07 NOTE — Progress Notes (Signed)
NAME:  MARION ROSENBERRY, MRN:  [id number], DOB:  May 22, 1960, LOS: 56 ADMISSION DATE:  02/20/2020, CONSULTATION DATE:  03/21/2020 REFERRING MD:  Dr. Manuella Ghazi, [facility], CHIEF COMPLAINT:  Short of breath   Brief History:  60 year old never vaccinated for covid with ARDS secondary to COVID-19 pneumonia, small bowel obstruction/ileus Intubated on 2/5  and transferred to [facility] long hospital  Past Medical History:  Ulcerative colitis s/p colectomy, Gout, CAD s/p DES, HTN, HLD, Nephrolithiasis, DM, Erythrocytosis  Significant Hospital Events:   1/15 Admit to [facility] 1/18 Endoscopic insertion of NG tube 1/20 start remdesivir, steroids 1/24 needing high flow oxygen and NRB 2/01 start TPN 2/5 Intubated and tx to [facility] 2/6 Stop TPN, start tube feeds he has an OG tube 2/8 Weaning benzos, started Precedex 2/10 pneumo-mediastinum, dropped tidal volume 540 cc2/10 pneumomediastinum/subcutaneous emphysema due to barotrauma 2/13 dysynchronous with vent over night/ placed on propofol by elink around  4am but not breathing over vent on am rounds / still on low doses of levophed. 2/13 so increased clonazepam per FT to 2 mg qid as well trial of seroquel 100 mg bid starting 2/13 am  2/14 changed BIS goal to -2 stopped precedex. Added librium and valproic acid. Solumedrol changed to pred. Spiked fever over the day. Got more tachycardic, O2 requirement increased. Sputum culture sent. vanc and cefepime started empirically for HCAP.  2/15 peep requirements up. Recurrent septic shock 2/2 HCAP. On neo as norepi exacerbated tachycardia further. abd more distended c/w recurrent ileus. Had urinary retention foley replaced. Started on urecholine.  2/16 better,PEEP/FIO2 needs down, tachycardia resolved. Ileus better changing FT to gravity from LIWS. Stopping vanc. Starting to wean fent first. New onset Fib w/ RVR and rate in 200s w/ associated hypotension. Attempted cardioversion X 3, each time would  Convert then go back into AF w/  RVR. Loaded w/ amio X 2, gtt started and started on lopressor as well as heparin gtt. CEs negative 2/17 Sinus tach, hypertensive requiring PRN treatment. Midodrine stopped. abd still soft. Starting trickle feeds. ECHO ejection fraction, by estimation, is 50 to 55%. The left ventricle has low normal function. Left ventricular endocardial border not optimally defined to evaluate regional wall motion. Left ventricular diastolic parameters were normal. Lopressor increased. Aline placed given concern about accuracy IV lasix administered. Had to stop lantus due to hypoglycemia.   Consults:  Gastroenterology s/o 1/28 Surgery s/o 1/19  Procedures:  Rt PICC 2/01 >>   Significant Diagnostic Tests:   CT abd/pelvis 1/16 >> lower lungs clear, mildly dilated air and fluid-filled loops of ileum measuring up to 3.2 cm. No clear transition point or focal wall narrowing  CT angio chest 1/28 >> diffuse b/l airspace opacity consistent with COVID 19 pneumonia  CT chest WO 2/10 >> diffuse pneumo mediastinum and subcutaneous emphysema, extensive bilateral groundglass disease ECHO 2/16: ejection fraction, by estimation, is 50 to 55%. The left ventricle has low normal function. Left ventricular endocardial border not optimally defined to evaluate regional wall motion. Left ventricular diastolic parameters were normal. Micro Data:  COVID PCR 1/15 >> Positive Flu PCR 1/15 >> negative Urine culture 2/2-pansensitive E. coli  Antimicrobials:  Remdesivir 1/20 >> 1/24 Steroids 1/20 >>  Baricitinib 1/27 >> 2/6 Unasyn 2/5 >>  2/12 Cefepime 2/14>>> vanc 2/14>>>2/16  Interim History / Subjective:  Rough day yesterday. Fib w/ RVR. O2 requirements up a little. Now on amiodarone and heparin. BP elevated   Objective   Blood pressure (Abnormal) 220/65, pulse (Abnormal) 110, temperature 99.7 F (  37.6 C), temperature source Axillary, resp. rate (Abnormal) 26, height 6\' 6"  (1.981 m), weight 95.8 kg, SpO2 91 %. CVP:  [8  mmHg] 8 mmHg  Vent Mode: PRVC FiO2 (%):  [70 %-100 %] 70 % Set Rate:  [26 bmp] 26 bmp Vt Set:  [540 mL] 540 mL PEEP:  [10 cmH20] 10 cmH20 Plateau Pressure:  [35 cmH20-37 cmH20] 37 cmH20   Intake/Output Summary (Last 24 hours) at 04/07/2020 0853 Last data filed at 04/07/2020 0800 Gross per 24 hour  Intake 4858.88 ml  Output 3875 ml  Net 983.88 ml   Filed Weights   04/05/20 1220 04/06/20 0412 04/07/20 0417  Weight: 99.9 kg 94.1 kg 95.8 kg   CVP:  [8 mmHg] 8 mmHg    Examination: General heavily sedated on vent. Hypertensive HENT orally intubated. Some temporal wasting. OGT in place pulm scattered rhonchi. Equal chest rise. Not able to calc PPlat due to vent asynchrony on my exam. Currently on Peep 10 fio2 70% sats 90%  Card ST no MRG abd soft. Slightly distended but not worse than exam the day prior. Hypoactive Bowel sounds GU clear yellow via f/c Ext some increase in edema pulses are strong Neuro BIS in 40s. Heavily sedated    Resolved Hospital Problem list   Presented also with gastroenteritis and increased stool volumes on presentation-->resolved SBO/ileus-->resolved  E coli UTI-->treated and resolved. (completed Rx 2/12) Hyponatremia  Septic shock 2/2 HCAP -->resolved and off pressors as of 2/17 Assessment & Plan:   Acute Hypoxic Respiratory failure w/ ARDS from COVID 19 pneumonia; complicated by HCAP 7/06 (NOS) O2 requirements a little worse. Suspect element of edema from yesterdays AF w/ RVR Plan: Cont low VT ventilation  Cont PEEP/FIO2 titration for sats > 88% and PPlat goal <30 PAD protocol; will keep RASS goal at -3 to -4 for now given some inc in O2 needs from yesterday  Day 4/7 cefepime Cont pred at 30-->dec to 20mg  on 2/16 Watch closely for abd distention  Am cxr  Afib w/ RVR  Plan Cont tele Cont IV heparin Cont IV amiodarone and lopressor  HTN, h/o CAD (DES 2016), HLD, HFrEF (EF slightly reduced to 50%)  Plan IV lasix Increased lopressor to 100  bid Cont ASA via tube Add back his Crestor and zetia  Add back Brilinta after trach  Acute Metabolic encephalopathy/ agitated delirium  Plan Cont prop and fent Cont Klonopin, valproic acid and Librium  RASS currently at goal of -3 to -4 IF Cont current prop and fent dosing; if we can get FIO2 requirements down again will decrease RASS goal   History of ulcerative colitis. W/ recurrent SBO (2/2 h/o adhesions) -abd more distended on 2/15; stopped tubefeeds and placed FT to LIWS. His abd is soft again  Plan Resume tubefeeds at trickle  Ck residuals Cont reglan scheduled  DM type 2 poorly controlled with steroid induced hyperglycemia. He is now hypoglycemic.   Plan Stop lantus Cont resistant scale If he tolerates tubefeeds suspect we will have to add back basal coverage     Best practice (evaluated daily)  Diet: TF-->palced on hold d/t recurrent ileus  DVT prophylaxis: lovenox GI prophylaxis: protonix Mobility: SBR Disposition: ICU Family:  Discussed daily Code Status: full  Lines/tubes->has PICC and foley-->remove foley 2/14-->replaced 2/15 due to retention. Added Urecholine 2/15 My cct 31 min  Erick Colace ACNP-BC [place] Pager # [phone] OR # [phone] if no answer

## 2020-04-07 NOTE — Procedures (Signed)
Arterial Catheter Insertion Procedure Note  [facility]  [id number]  Apr 05, 1960  Date:04/07/20  Time:12:09 PM    Provider Performing: Delight Ovens    Procedure: Insertion of Arterial Line [phone]) without US guidance  Indication(s) Blood pressure monitoring and/or need for frequent ABGs  Consent Unable to obtain consent due to emergent nature of procedure.  Anesthesia None   Time Out Verified patient identification, verified procedure, site/side was marked, verified correct patient position, special equipment/implants available, medications/allergies/relevant history reviewed, required imaging and test results available.   Sterile Technique Maximal sterile technique including full sterile barrier drape, hand hygiene, sterile gown, sterile gloves, mask, hair covering, sterile ultrasound probe cover (if used).   Procedure Description Area of catheter insertion was cleaned with chlorhexidine and draped in sterile fashion. Without real-time ultrasound guidance an arterial catheter was placed into the left radial artery.  Appropriate arterial tracings confirmed on monitor.     Complications/Tolerance None; patient tolerated the procedure well.   EBL Minimal   Specimen(s) None

## 2020-04-07 NOTE — Progress Notes (Signed)
Brief Pharmacy Consult Note - IV heparin follow up  Labs: heparin level 0.35  A/P: heparin level therapeutic (0.3-0.7) on current IV heparin rate of 1800 units/hr. No reported bleeding or issues per RN. Continue current IV heparin rate  Adrian Saran, PharmD, BCPS 04/07/2020 3:44 PM

## 2020-04-08 ENCOUNTER — Inpatient Hospital Stay (HOSPITAL_COMMUNITY): Payer: Commercial Managed Care - PPO

## 2020-04-08 DIAGNOSIS — K56600 Partial intestinal obstruction, unspecified as to cause: Secondary | ICD-10-CM | POA: Diagnosis not present

## 2020-04-08 LAB — COMPREHENSIVE METABOLIC PANEL
ALT: 28 U/L (ref 0–44)
AST: 26 U/L (ref 15–41)
Albumin: 2 g/dL — ABNORMAL LOW (ref 3.5–5.0)
Alkaline Phosphatase: 54 U/L (ref 38–126)
Anion gap: 12 (ref 5–15)
BUN: 15 mg/dL (ref 8–23)
CO2: 36 mmol/L — ABNORMAL HIGH (ref 22–32)
Calcium: 7.9 mg/dL — ABNORMAL LOW (ref 8.9–10.3)
Chloride: 94 mmol/L — ABNORMAL LOW (ref 98–111)
Creatinine, Ser: 0.39 mg/dL — ABNORMAL LOW (ref 0.61–1.24)
GFR, Estimated: >60 mL/min (ref >60)
Glucose, Bld: 151 mg/dL — ABNORMAL HIGH (ref 70–99)
Potassium: 3.4 mmol/L — ABNORMAL LOW (ref 3.5–5.1)
Sodium: 142 mmol/L (ref 135–145)
Total Bilirubin: 0.7 mg/dL (ref 0.3–1.2)
Total Protein: 5.7 g/dL — ABNORMAL LOW (ref 6.5–8.1)

## 2020-04-08 LAB — GLUCOSE, CAPILLARY
Glucose-Capillary: 118 mg/dL — ABNORMAL HIGH (ref 70–99)
Glucose-Capillary: 151 mg/dL — ABNORMAL HIGH (ref 70–99)
Glucose-Capillary: 159 mg/dL — ABNORMAL HIGH (ref 70–99)
Glucose-Capillary: 166 mg/dL — ABNORMAL HIGH (ref 70–99)
Glucose-Capillary: 211 mg/dL — ABNORMAL HIGH (ref 70–99)
Glucose-Capillary: 232 mg/dL — ABNORMAL HIGH (ref 70–99)
Glucose-Capillary: 233 mg/dL — ABNORMAL HIGH (ref 70–99)

## 2020-04-08 LAB — CBC
HCT: 31.1 % — ABNORMAL LOW (ref 39.0–52.0)
Hemoglobin: 10.5 g/dL — ABNORMAL LOW (ref 13.0–17.0)
MCH: 31.7 pg (ref 26.0–34.0)
MCHC: 33.8 g/dL (ref 30.0–36.0)
MCV: 94 fL (ref 80.0–100.0)
Platelets: 153 10*3/uL (ref 150–400)
RBC: 3.31 MIL/uL — ABNORMAL LOW (ref 4.22–5.81)
RDW: 14 % (ref 11.5–15.5)
WBC: 6.1 10*3/uL (ref 4.0–10.5)
nRBC: 0 % (ref 0.0–0.2)

## 2020-04-08 LAB — LACTIC ACID, PLASMA: Lactic Acid, Venous: 2.2 mmol/L (ref 0.5–1.9)

## 2020-04-08 LAB — CALCIUM, IONIZED: Calcium, Ionized, Serum: 4.6 mg/dL (ref 4.5–5.6)

## 2020-04-08 LAB — MAGNESIUM
Magnesium: 1.8 mg/dL (ref 1.7–2.4)
Magnesium: 2.2 mg/dL (ref 1.7–2.4)

## 2020-04-08 LAB — PHOSPHORUS
Phosphorus: 2.6 mg/dL (ref 2.5–4.6)
Phosphorus: 2.3 mg/dL — ABNORMAL LOW (ref 2.5–4.6)

## 2020-04-08 LAB — HEPARIN LEVEL (UNFRACTIONATED): Heparin Unfractionated: 0.43 IU/mL (ref 0.30–0.70)

## 2020-04-08 MED ORDER — MAGNESIUM SULFATE 4 GM/100ML IV SOLN
4.0000 g | Freq: Once | INTRAVENOUS | Status: AC
  Administered 2020-04-08: 4 g via INTRAVENOUS
  Filled 2020-04-08: qty 100

## 2020-04-08 MED ORDER — FUROSEMIDE 10 MG/ML IJ SOLN
40.0000 mg | Freq: Four times a day (QID) | INTRAMUSCULAR | Status: DC
  Administered 2020-04-08 – 2020-04-10 (×7): 40 mg via INTRAVENOUS
  Filled 2020-04-08 (×7): qty 4

## 2020-04-08 MED ORDER — METOCLOPRAMIDE HCL 5 MG/ML IJ SOLN
5.0000 mg | Freq: Four times a day (QID) | INTRAMUSCULAR | Status: DC
  Administered 2020-04-08 – 2020-04-15 (×25): 5 mg via INTRAVENOUS
  Filled 2020-04-08 (×26): qty 2

## 2020-04-08 MED ORDER — PROSOURCE TF PO LIQD
45.0000 mL | Freq: Three times a day (TID) | ORAL | Status: DC
  Administered 2020-04-08 – 2020-04-14 (×18): 45 mL
  Filled 2020-04-08 (×17): qty 45

## 2020-04-08 MED ORDER — PIVOT 1.5 CAL PO LIQD
1000.0000 mL | ORAL | Status: DC
  Administered 2020-04-08: 1000 mL
  Filled 2020-04-08: qty 1000

## 2020-04-08 MED ORDER — LACTATED RINGERS IV BOLUS
500.0000 mL | Freq: Once | INTRAVENOUS | Status: AC
  Administered 2020-04-08: 500 mL via INTRAVENOUS

## 2020-04-08 MED ORDER — VASOPRESSIN 20 UNITS/100 ML INFUSION FOR SHOCK
0.0000 [IU]/min | INTRAVENOUS | Status: DC
  Filled 2020-04-08 (×2): qty 100

## 2020-04-08 MED ORDER — PREDNISONE 20 MG PO TABS
20.0000 mg | ORAL_TABLET | Freq: Every day | ORAL | Status: DC
  Administered 2020-04-08 – 2020-04-12 (×5): 20 mg
  Filled 2020-04-08 (×5): qty 1

## 2020-04-08 MED ORDER — METOPROLOL TARTRATE 25 MG/10 ML ORAL SUSPENSION
50.0000 mg | Freq: Two times a day (BID) | ORAL | Status: DC
  Administered 2020-04-08: 50 mg
  Filled 2020-04-08: qty 20

## 2020-04-08 MED ORDER — PIVOT 1.5 CAL PO LIQD
1000.0000 mL | ORAL | Status: DC
  Administered 2020-04-08 – 2020-04-14 (×8): 1000 mL
  Filled 2020-04-08 (×9): qty 1000

## 2020-04-08 MED ORDER — POTASSIUM CHLORIDE 20 MEQ PO PACK
40.0000 meq | PACK | Freq: Three times a day (TID) | ORAL | Status: AC
  Administered 2020-04-08 – 2020-04-09 (×3): 40 meq
  Filled 2020-04-08 (×3): qty 2

## 2020-04-08 MED ORDER — NOREPINEPHRINE 16 MG/250ML-% IV SOLN
0.0000 ug/min | INTRAVENOUS | Status: DC
  Administered 2020-04-08: 2 ug/min via INTRAVENOUS
  Filled 2020-04-08: qty 250

## 2020-04-08 NOTE — Progress Notes (Signed)
NAME:  Calvin Cruz, MRN:  [id number], DOB:  Dec 21, 1960, LOS: 21 ADMISSION DATE:  03/06/2020, CONSULTATION DATE:  03/21/2020 REFERRING MD:  Dr. Manuella Ghazi, [facility], CHIEF COMPLAINT:  Short of breath   Brief History:  60 year old never vaccinated for covid with ARDS secondary to COVID-19 pneumonia, small bowel obstruction/ileus Intubated on 2/5  and transferred to [facility] long hospital  Past Medical History:  Ulcerative colitis s/p colectomy, Gout, CAD s/p DES, HTN, HLD, Nephrolithiasis, DM, Erythrocytosis  Significant Hospital Events:   1/15 Admit to [facility] 1/18 Endoscopic insertion of NG tube 1/20 start remdesivir, steroids 1/24 needing high flow oxygen and NRB 2/01 start TPN 2/5 Intubated and tx to [facility] 2/6 Stop TPN, start tube feeds he has an OG tube 2/8 Weaning benzos, started Precedex 2/10 pneumo-mediastinum, dropped tidal volume 540 cc2/10 pneumomediastinum/subcutaneous emphysema due to barotrauma 2/13 dysynchronous with vent over night/ placed on propofol by elink around  4am but not breathing over vent on am rounds / still on low doses of levophed. 2/13 so increased clonazepam per FT to 2 mg qid as well trial of seroquel 100 mg bid starting 2/13 am  2/14 changed BIS goal to -2 stopped precedex. Added librium and valproic acid. Solumedrol changed to pred. Spiked fever over the day. Got more tachycardic, O2 requirement increased. Sputum culture sent. vanc and cefepime started empirically for HCAP.  2/15 peep requirements up. Recurrent septic shock 2/2 HCAP. On neo as norepi exacerbated tachycardia further. abd more distended c/w recurrent ileus. Had urinary retention foley replaced. Started on urecholine.  2/16 better,PEEP/FIO2 needs down, tachycardia resolved. Ileus better changing FT to gravity from LIWS. Stopping vanc. Starting to wean fent first. New onset Fib w/ RVR and rate in 200s w/ associated hypotension. Attempted cardioversion X 3, each time would  Convert then go back into AF w/  RVR. Loaded w/ amio X 2, gtt started and started on lopressor as well as heparin gtt. CEs negative 2/17 Sinus tach, hypertensive requiring PRN treatment. Midodrine stopped. abd still soft. Starting trickle feeds. ECHO ejection fraction, by estimation, is 50 to 55%. The left ventricle has low normal function. Left ventricular endocardial border not optimally defined to evaluate regional wall motion. Left ventricular diastolic parameters were normal. Lopressor increased. Aline placed given concern about accuracy IV lasix administered. Had to stop lantus due to hypoglycemia. Seen by palliative. Goals of care discussed. Still wanted aggressive care 2/18 pushing diuresis. Remains in NSR. Requiring intermittent Neo but felt drug related. Lopressor dosing decreased. Asked nursing to preferentially wean fent and changed RASS goal to -2 to -3  Consults:  Gastroenterology s/o 1/28 Surgery s/o 1/19  Procedures:  Rt PICC 2/01 >>   Significant Diagnostic Tests:   CT abd/pelvis 1/16 >> lower lungs clear, mildly dilated air and fluid-filled loops of ileum measuring up to 3.2 cm. No clear transition point or focal wall narrowing  CT angio chest 1/28 >> diffuse b/l airspace opacity consistent with COVID 19 pneumonia  CT chest WO 2/10 >> diffuse pneumo mediastinum and subcutaneous emphysema, extensive bilateral groundglass disease ECHO 2/16: ejection fraction, by estimation, is 50 to 55%. The left ventricle has low normal function. Left ventricular endocardial border not optimally defined to evaluate regional wall motion. Left ventricular diastolic parameters were normal. Micro Data:  COVID PCR 1/15 >> Positive Flu PCR 1/15 >> negative Urine culture 2/2-pansensitive E. coli  Antimicrobials:  Remdesivir 1/20 >> 1/24 Steroids 1/20 >>  Baricitinib 1/27 >> 2/6 Unasyn 2/5 >>  2/12 Cefepime 2/14>>> vanc  2/14>>>2/16  Interim History / Subjective:  Intermittently on pressor last night. O2 requirements still  higher c/w 48 hrs ago. Looks volume up.   Objective   Blood pressure (Abnormal) 163/62, pulse 94, temperature 98.6 F (37 C), resp. rate (Abnormal) 27, height 6\' 6"  (1.981 m), weight 96.8 kg, SpO2 (Abnormal) 88 %. CVP:  [9 mmHg] 9 mmHg  Vent Mode: PRVC FiO2 (%):  [70 %-80 %] 80 % Set Rate:  [26 bmp] 26 bmp Vt Set:  [540 mL] 540 mL PEEP:  [10 cmH20] 10 cmH20 Plateau Pressure:  [21 cmH20-38 cmH20] 21 cmH20   Intake/Output Summary (Last 24 hours) at 04/08/2020 0830 Last data filed at 04/08/2020 0630 Gross per 24 hour  Intake 2172.42 ml  Output 5040 ml  Net -2867.58 ml   Filed Weights   04/06/20 0412 04/07/20 0417 04/08/20 0416  Weight: 94.1 kg 95.8 kg 96.8 kg   CVP:  [9 mmHg] 9 mmHg    Examination: General this is a 60 year old male. He remains heavily sedated on the vent.  HENT orally intubated. MMM some temporal wasting  Pulm coarse bilateral breath sounds. Equal and non-labored efforts. Pplat 31 PEEP 10 FIO2 80 sats 90% Card rrr, he is in NSR currently. Still on amio infusion  abd soft. Seems to be tol trickle feeds. Hypoactive but present bowel sounds Ext diffuse edema brisk CR strong pulses gu has foley cath Neuro sedated on fent and prop infusion   Resolved Hospital Problem list   Presented also with gastroenteritis and increased stool volumes on presentation-->resolved SBO/ileus-->resolved  E coli UTI-->treated and resolved. (completed Rx 2/12) Hyponatremia  Septic shock 2/2 HCAP -->resolved and off pressors as of 2/17 Assessment & Plan:   Acute Hypoxic Respiratory failure w/ ARDS from COVID 19 pneumonia; complicated by HCAP 3/15 (NOS) pcxr w/ worsening Right slightly worse than left airspace disease.overall c/w increased oxygen demand Plan: Cont low VT ventilation Cont to titrate PEEP/FIO2 for sats >88 and pplat <30 PAD protocol-->RASS goal -2 to -3; have asked nursing to try to dial back on fent first He is still 19 liters positive but this is better than  yesterday (was 21.5); BUN and cr tolerated so will cont aggressive diuresis Will place a [place] prob on right side of chest. Doesn't look like effusion on cxr but can be difficult to sort out at times on plain 1 view Am cxr Changing pred to 20mg  today  Watch abd distention closely  Day 5/7 of cefepime  Drug induced Hypotension/ was Hypertensive 2/17.  Plan Dec lopressor and place hold orders Trying to come down on fentanyl Neo for MAP > 65  Afib w/ RVR ->currently NSR after amiodarone an bb added Plan Cont tele Cont IV heparin Cont amiodarone infusion Backing down on lopressor dosing due to hypotension Keep Mg > 2 and K >4  h/o CAD (DES 2016), HLD, HFrEF (EF slightly reduced to 50%)  Plan IV lasix Change lopressor to 50 q 12 Cont asa via tube Cont crestor and zetia Brilinta to be added back eventually (had been on maintenance dosing) holding due to pending trach   Fluid and electrolyte imbalance: hypokalemia Plan Replace  Acute Metabolic encephalopathy/ agitated delirium  - still heavily sedated  Plan Cont prop and fent-->working on coming down on fent (will adjust ceiling)  Cont Klonopin, valproic acid and librium Changing RASS goal to -2 to -3   History of ulcerative colitis. W/ recurrent SBO (2/2 h/o adhesions) -abd more distended on 2/15; stopped  tubefeeds and placed FT to LIWS. His abd is soft again and is tolerating tube feeds again  Plan Will cont tube feeds. Increase rate slowly (40 ml/hr) Ask RD to re-engage.   DM type 2 poorly controlled with steroid induced hyperglycemia. -glucose was low while holding tube feeds. Now control is poor again Plan Will cont ssi Add tubefeed coverage Reluctant to add lantus currently as need to be sure tolerating tubefeeds   Mild anemia of critical illness hgb stable Plan Trend  Transfuse for hgb < 7   Best practice (evaluated daily)  Diet: TF-->resumed (had been held due to ileus) increasing rate 2/18 DVT  prophylaxis: lovenox GI prophylaxis: protonix Mobility: SBR Disposition: ICU Family:  Discussed daily Code Status: full  Lines/tubes->has PICC and foley-->remove foley 2/14-->replaced 2/15 due to retention. Added Urecholine 2/15-->both still needed as of 2/18  My cct 33 min  Erick Colace ACNP-BC [facility] Pager # [phone] OR # [phone] if no answer

## 2020-04-08 NOTE — Progress Notes (Signed)
Patient vasopressor needs increased significantly from 30 mcg to 140 mcg. Elzie Rings, MD notified and ordered received for 500 cc LR bolus and Levophed infusion.

## 2020-04-08 NOTE — Progress Notes (Signed)
Noted post administration of HS lasix dose and Lopressor , pt noted to have decrease in MAP to 53 -57. Neo started at 82mcq per protocol. Noted titrating gtt to reach MAP GOAL of >65. No noted other complications. On going assessment

## 2020-04-08 NOTE — Progress Notes (Signed)
[facility] Indication: atrial fibrillation  Allergies  Allergen Reactions  . Metronidazole Itching    Unknown  . Ciprofloxacin Itching and Nausea And Vomiting  . Dexilant [Dexlansoprazole] Diarrhea  . Morphine And Related Itching  . Doxycycline Itching, Other (See Comments) and Rash    Redness of skin, burning sensation Unknown     Patient Measurements: Height: 6\' 6"  (198.1 cm) Weight: 96.8 kg (213 lb 6.5 oz) IBW/kg (Calculated) : 91.4 Heparin Dosing Weight: 94 kg  Vital Signs: Temp: 98.5 F (36.9 C) (02/18 0400) Temp Source: Axillary (02/18 0400) Pulse Rate: 85 (02/18 0600)  Labs: Recent Labs    04/06/20 1222 04/06/20 1230 04/06/20 1645 04/06/20 1751 04/06/20 2307 04/07/20 0030 04/07/20 0823 04/07/20 1442 04/08/20 0438  HGB  --   --   --   --   --  9.2*  --   --  10.5*  HCT  --   --   --   --   --  28.3*  --   --  31.1*  PLT  --   --   --   --   --  187  --   --  153  APTT 34  --   --   --   --   --   --   --   --   LABPROT 14.0  --   --   --   --   --   --   --   --   INR 1.1  --   --   --   --   --   --   --   --   HEPARINUNFRC  --    < > 0.12*  --   --  0.21* 0.34 0.35 0.43  CREATININE  --   --   --  0.31*  --  0.32* 0.38*  --  0.39*  TROPONINIHS  --    < > 15 16 15   --   --   --   --    < > = values in this interval not displayed.    Estimated Creatinine Clearance: 125.4 mL/min (A) (by C-G formula based on SCr of 0.39 mg/dL (L)).  Assessment: 60 y/o M admitted with respiratory failure due to COVID-19 PNA developed atrial fibrillation with rapid ventricular response. Pharmacy consulted to initiate heparin infusion. Last dose of prophylaxis Lovenox was last PM.   04/08/2020 6:59 AM  Heparin level 0.43 at goal on heparin infusion at 1800 units/hr  PLT WNL but slightly decreased; Hgb improved today 10.5  No issues reported with IV infusion  No bleeding reported, confirmed with RN  Goal of  Therapy:  Heparin level 0.3-0.7 units/ml Monitor platelets by anticoagulation protocol: Yes   Plan:  Continue heparin infusion at 1800 units/hr Check anti-Xa level daily Monitor CBC closely and s/s of bleeding  Peggyann Juba, PharmD, BCPS Pharmacy: [phone] 04/08/2020,6:59 AM

## 2020-04-08 NOTE — Progress Notes (Signed)
Nutrition Follow-up  DOCUMENTATION CODES:   Not applicable  INTERVENTION:  - continue Pivot 1.5 @ 40 ml/hr and advance by 10 ml every 24 hours to reach goal rate of 60 ml/hr with 45 ml Prosource TF TID. - at goal rate, this regimen, without kcal from propofol, will provide 2280 kcal, 168 grams protein, 11 grams fiber, and 1093 ml free water.  - free water flush to continue to be per CCM.    NUTRITION DIAGNOSIS:   Inadequate oral intake related to inability to eat as evidenced by NPO status. -ongoing  GOAL:   Patient will meet greater than or equal to 90% of their needs -unable to meet at this time  MONITOR:   Vent status,TF tolerance,Labs,Weight trends,Skin   ASSESSMENT:   Patient is a 60 yo male with history of Ulcerative colitis s/p ileal anastomosis /colectomy, gout, CAD, DM2 and HTN. He presents with small bowel obstruction and COVID-19 pneumonia and gastroenteritis/infection.  Significant Events: 1/15- admission 1/18- endoscopic insertion of NGT 1/19- Surgery signed off 1/31- diet advanced from NPO to Low Residue 2/3- Soft diet 2/1- TPN initiation 2/5- intubation; OGT placement 2/6- TPN stopped; TF initiation 2/15- TF placed on hold d/t abdominal distention 2/17- TF resumed at trickle rate of 20 ml/hr   Patient remains intubated with OGT in place. No family/visitors present. Able to talk with CCM about TF and plan for very slow advancement.  Patient is currently receiving Pivot 1.5 @ 40 ml/hr (increased at 1200 from 20 ml/hr) with 45 ml Prosource TF BID, and 100 mg free water every 4 hours. This regimen is providing 1520 kcal, 112 grams protein, 7.2 grams fiber, and 1328 ml free water.   Weight has been fluctuating nearly daily since admission. Deep pitting edema to BUE and moderate pitting edema to BLE documented in the edema section of flow sheet.      Patient is currently intubated on ventilator support MV: 14.4 L/min Temp (24hrs), Avg:98.4 F (36.9 C),  Min:97.8 F (36.6 C), Max:99.1 F (37.3 C) Propofol: 15.8 ml/hr (417 kcal) BP: 94/42 and MAP: 57  Labs reviewed; CBGs: 151, 118, 159, 166 mg/dl, K: 3.4 mmol/l, Cl: 94 mmol/l, creatinine: 0.39 mg/dl, Ca: 7.9 mg/dl. Medications reviewed; 5000 units cholecalciferol/day, 40 mg IV lasix QID, sliding scale novolog, 10 mg IV reglan QID, 40 mg protonix BID, 17 g miralax/day, 40 mEq Klor-Con TID, 20 mg deltasone/day. Drips; propofol @ 30 mcg/kg/min, neo @ 40 mcg/min, heparin @ 1800 units/hr, amio @ 30 mg/hr, fentanyl @ 150 mcg/hr.     Diet Order:   Diet Order            Diet NPO time specified  Diet effective now                 EDUCATION NEEDS:   Not appropriate for education at this time  Skin:  Skin Assessment: Skin Integrity Issues: Skin Integrity Issues:: Stage I Stage I: sacrum  Last BM:  2/11 (type 6)  Height:   Ht Readings from Last 1 Encounters:  04/06/20 6\' 6"  (1.981 m)    Weight:   Wt Readings from Last 1 Encounters:  04/08/20 96.8 kg     Estimated Nutritional Needs:  Kcal:  2297 kcal Protein:  150-170 grams Fluid:  >/= 2.5 L/day     Jarome Matin, MS, RD, LDN, CNSC Inpatient Clinical Dietitian RD pager # available in [place]  After hours/weekend pager # available in [facility]

## 2020-04-08 NOTE — Progress Notes (Signed)
Chaplain engaged in an initial visit with Calvin Cruz and his wife, Calvin Cruz.  During visit, chaplain learned that they have been married 110 years.  His wife described them as being "soulmates."  She expressed that they have grown up together. She also expressed that he is a God-fearing family man.  They have three boys together.    Calvin Cruz also discussed [name]'s health journey.  She noted the different challenges he has faced and how familiar this hospital visit is to her since she has experienced it before.  She described Calvin Cruz as being really strong. She stated that Calvin Cruz is a very hardworking man and has been accustomed to working 12-13 hours shifts.  He has a high tolerance for pain, limiting his use of pain medication, and manages to keep going even after being in the hospital.  She stated that even after having a heart attack a couple of weeks later he was back to working.    Calvin Cruz is hopeful during this time and depends on her faith, as well as the support of her family to help her during this time.   Chaplain offered ministries of presence, listening and support.    04/08/20 1700  Clinical Encounter Type  Visited With Patient and family together  Visit Type Initial

## 2020-04-08 NOTE — TOC Progression Note (Signed)
Transition of Care [facility]) - Progression Note    Patient Details  Name: Calvin Cruz MRN: [id number] Date of Birth: Jun 26, 1960  Transition of [facility]) CM/SW Contact  Leeroy Cha, RN Phone Number: 04/08/2020, 8:52 AM  Clinical Narrative:    No acute events overnight. Had episodes of SVT yesterday with afib with RVR requiring synchronized cardioversion x 2. Started on amiodarone, heparin drip and scheduled beta blockers with no further issues overnight.     Having difficulty obtaining blood pressure via cuff due to swollen upper extremities.   A.lines inserted on 021722 Vent at 80% 021822, iv sedation, iv abx, iv pressors. Plan: possible ltach placement if becomes stable.    Barriers to Discharge: Continued Medical Work up  Expected Discharge Plan and Services                                                 Social Determinants of Health (SDOH) Interventions    Readmission Risk Interventions No flowsheet data found.

## 2020-04-08 NOTE — Progress Notes (Addendum)
Per Elzie Rings, MD, RN to titrate down on Phenylephrine and cut off prior to titration of Levophed. If vasopressor requirements continue to increase RN to initiate Vasopressin once Levophed to 10 mcg and Phenylephrine off.

## 2020-04-09 ENCOUNTER — Inpatient Hospital Stay (HOSPITAL_COMMUNITY): Payer: Commercial Managed Care - PPO

## 2020-04-09 DIAGNOSIS — K56600 Partial intestinal obstruction, unspecified as to cause: Secondary | ICD-10-CM | POA: Diagnosis not present

## 2020-04-09 LAB — CBC
HCT: 31.8 % — ABNORMAL LOW (ref 39.0–52.0)
Hemoglobin: 10.4 g/dL — ABNORMAL LOW (ref 13.0–17.0)
MCH: 31.1 pg (ref 26.0–34.0)
MCHC: 32.7 g/dL (ref 30.0–36.0)
MCV: 95.2 fL (ref 80.0–100.0)
Platelets: 160 10*3/uL (ref 150–400)
RBC: 3.34 MIL/uL — ABNORMAL LOW (ref 4.22–5.81)
RDW: 14 % (ref 11.5–15.5)
WBC: 6.3 10*3/uL (ref 4.0–10.5)
nRBC: 0 % (ref 0.0–0.2)

## 2020-04-09 LAB — BASIC METABOLIC PANEL
Anion gap: 12 (ref 5–15)
BUN: 21 mg/dL (ref 8–23)
CO2: 38 mmol/L — ABNORMAL HIGH (ref 22–32)
Calcium: 7.7 mg/dL — ABNORMAL LOW (ref 8.9–10.3)
Chloride: 90 mmol/L — ABNORMAL LOW (ref 98–111)
Creatinine, Ser: 0.38 mg/dL — ABNORMAL LOW (ref 0.61–1.24)
GFR, Estimated: >60 mL/min (ref >60)
Glucose, Bld: 317 mg/dL — ABNORMAL HIGH (ref 70–99)
Potassium: 3.5 mmol/L (ref 3.5–5.1)
Sodium: 140 mmol/L (ref 135–145)

## 2020-04-09 LAB — HEPARIN LEVEL (UNFRACTIONATED)
Heparin Unfractionated: 0.29 IU/mL — ABNORMAL LOW (ref 0.30–0.70)
Heparin Unfractionated: 0.38 IU/mL (ref 0.30–0.70)

## 2020-04-09 LAB — GLUCOSE, CAPILLARY
Glucose-Capillary: 201 mg/dL — ABNORMAL HIGH (ref 70–99)
Glucose-Capillary: 241 mg/dL — ABNORMAL HIGH (ref 70–99)
Glucose-Capillary: 260 mg/dL — ABNORMAL HIGH (ref 70–99)
Glucose-Capillary: 280 mg/dL — ABNORMAL HIGH (ref 70–99)
Glucose-Capillary: 256 mg/dL — ABNORMAL HIGH (ref 70–99)
Glucose-Capillary: 254 mg/dL — ABNORMAL HIGH (ref 70–99)

## 2020-04-09 LAB — COMPREHENSIVE METABOLIC PANEL
ALT: 33 U/L (ref 0–44)
AST: 25 U/L (ref 15–41)
Albumin: 2 g/dL — ABNORMAL LOW (ref 3.5–5.0)
Alkaline Phosphatase: 54 U/L (ref 38–126)
Anion gap: 11 (ref 5–15)
BUN: 23 mg/dL (ref 8–23)
CO2: 36 mmol/L — ABNORMAL HIGH (ref 22–32)
Calcium: 7.9 mg/dL — ABNORMAL LOW (ref 8.9–10.3)
Chloride: 94 mmol/L — ABNORMAL LOW (ref 98–111)
Creatinine, Ser: <0.3 mg/dL — ABNORMAL LOW (ref 0.61–1.24)
Glucose, Bld: 221 mg/dL — ABNORMAL HIGH (ref 70–99)
Potassium: 3.5 mmol/L (ref 3.5–5.1)
Sodium: 141 mmol/L (ref 135–145)
Total Bilirubin: 0.7 mg/dL (ref 0.3–1.2)
Total Protein: 5.7 g/dL — ABNORMAL LOW (ref 6.5–8.1)

## 2020-04-09 LAB — MAGNESIUM: Magnesium: 1.9 mg/dL (ref 1.7–2.4)

## 2020-04-09 MED ORDER — POTASSIUM CHLORIDE 10 MEQ/50ML IV SOLN
10.0000 meq | INTRAVENOUS | Status: AC
  Administered 2020-04-09 – 2020-04-10 (×4): 10 meq via INTRAVENOUS
  Filled 2020-04-09 (×4): qty 50

## 2020-04-09 MED ORDER — PHENYLEPHRINE HCL-NACL 10-0.9 MG/250ML-% IV SOLN
0.0000 ug/min | INTRAVENOUS | Status: DC
  Administered 2020-04-09: 110 ug/min via INTRAVENOUS
  Administered 2020-04-09: 40 ug/min via INTRAVENOUS
  Administered 2020-04-09: 13.333 ug/min via INTRAVENOUS
  Administered 2020-04-09: 90 ug/min via INTRAVENOUS
  Administered 2020-04-09: 20 ug/min via INTRAVENOUS
  Administered 2020-04-10: 120 ug/min via INTRAVENOUS
  Administered 2020-04-10: 110 ug/min via INTRAVENOUS
  Administered 2020-04-10: 120 ug/min via INTRAVENOUS
  Filled 2020-04-09 (×9): qty 250

## 2020-04-09 MED ORDER — METOPROLOL TARTRATE 12.5 MG HALF TABLET
12.5000 mg | ORAL_TABLET | Freq: Four times a day (QID) | ORAL | Status: DC
  Administered 2020-04-09 – 2020-04-11 (×7): 12.5 mg via ORAL
  Filled 2020-04-09 (×8): qty 1

## 2020-04-09 MED ORDER — PHENYLEPHRINE HCL-NACL 10-0.9 MG/250ML-% IV SOLN
INTRAVENOUS | Status: AC
  Filled 2020-04-09: qty 250

## 2020-04-09 MED ORDER — AMIODARONE IV BOLUS ONLY 150 MG/100ML
150.0000 mg | Freq: Once | INTRAVENOUS | Status: AC
  Administered 2020-04-09: 150 mg via INTRAVENOUS

## 2020-04-09 MED ORDER — SENNA 8.6 MG PO TABS
1.0000 | ORAL_TABLET | Freq: Every day | ORAL | Status: DC
  Administered 2020-04-09 – 2020-04-11 (×3): 8.6 mg via ORAL
  Filled 2020-04-09 (×3): qty 1

## 2020-04-09 MED ORDER — METOPROLOL TARTRATE 5 MG/5ML IV SOLN
5.0000 mg | Freq: Once | INTRAVENOUS | Status: AC
  Administered 2020-04-09: 5 mg via INTRAVENOUS

## 2020-04-09 MED ORDER — HEPARIN BOLUS VIA INFUSION
1500.0000 [IU] | Freq: Once | INTRAVENOUS | Status: AC
  Administered 2020-04-09: 1500 [IU] via INTRAVENOUS
  Filled 2020-04-09: qty 1500

## 2020-04-09 MED ORDER — METOPROLOL TARTRATE 5 MG/5ML IV SOLN
5.0000 mg | Freq: Once | INTRAVENOUS | Status: AC
  Administered 2020-04-09: 5 mg via INTRAVENOUS
  Filled 2020-04-09: qty 5

## 2020-04-09 NOTE — Progress Notes (Signed)
[facility] Progress Note Patient Name: BRECKEN DEWOODY DOB: 08/24/1960 MRN: [id number]   Date of Service  04/09/2020  HPI/Events of Note  AFIB with RVR - Ventricular rate = 116-130. K+ = 3.5 at 4:30 PM which was not replaced. Currently on Amiodarone IV infusion and Metoprolol PO/IV.  eICU Interventions  Plan: 1. Amiodarone 150 mg IV over 10 minutes now.  2. Replace K+. 3. Mg++ level STAT.      Intervention Category Major Interventions: Arrhythmia - evaluation and management  Tayen Narang Cornelia Copa 04/09/2020, 8:41 PM

## 2020-04-09 NOTE — Progress Notes (Signed)
[place] for Heparin Infusion Indication: atrial fibrillation  Allergies  Allergen Reactions  . Metronidazole Itching    Unknown  . Ciprofloxacin Itching and Nausea And Vomiting  . Dexilant [Dexlansoprazole] Diarrhea  . Morphine And Related Itching  . Doxycycline Itching, Other (See Comments) and Rash    Redness of skin, burning sensation Unknown     Patient Measurements: Height: 6\' 6"  (198.1 cm) Weight: 92.4 kg (203 lb 11.3 oz) IBW/kg (Calculated) : 91.4 Heparin Dosing Weight: 94 kg  Vital Signs: Temp: 98.3 F (36.8 C) (02/19 0846) Temp Source: Axillary (02/19 0846) BP: 132/72 (02/19 1200) Pulse Rate: 95 (02/19 1245)  Labs: Recent Labs    04/06/20 1645 04/06/20 1645 04/06/20 1751 04/06/20 2307 04/07/20 0030 04/07/20 0823 04/07/20 1442 04/08/20 0438 04/09/20 0524 04/09/20 1255  HGB  --    < >  --   --  9.2*  --   --  10.5* 10.4*  --   HCT  --   --   --   --  28.3*  --   --  31.1* 31.8*  --   PLT  --   --   --   --  187  --   --  153 160  --   HEPARINUNFRC 0.12*  --   --   --  0.21* 0.34   < > 0.43 0.29* 0.38  CREATININE  --    < > 0.31*  --  0.32* 0.38*  --  0.39* <0.30*  --   TROPONINIHS 15  --  16 15  --   --   --   --   --   --    < > = values in this interval not displayed.    CrCl cannot be calculated (This lab value cannot be used to calculate CrCl because it is not a number: <0.30).  Assessment: 60 y/o M admitted with respiratory failure due to COVID-19 PNA developed atrial fibrillation with rapid ventricular response. Pharmacy consulted to initiate heparin infusion. Last dose of prophylaxis Lovenox was last PM.   04/09/2020 1:30 PM  Heparin level 0.38, therapeutic on heparin 2000 units/hr  CBC stable  No issues with infusion/line or bleeding reported  Goal of Therapy:  Heparin level 0.3-0.7 units/ml Monitor platelets by anticoagulation protocol: Yes   Plan:    Continue IV heparin infusion at 2000  unit/hr Check anti-Xa level daily Monitor CBC closely and s/s of bleeding  Peggyann Juba, PharmD, BCPS Pharmacy: [phone] 04/09/2020,1:30 PM

## 2020-04-09 NOTE — Progress Notes (Signed)
Patient switching from Sinus tach into A-fib, aflutter, w/ runs of non-sustained V-tach.   E-link called, and orders received to replace K+, Amiodarone bolus, and Stat Mg+ levels   Patient has remained in Sinus Tach since Amiodarone bolus.

## 2020-04-09 NOTE — Progress Notes (Deleted)
[place] for Heparin Infusion Indication: atrial fibrillation  Allergies  Allergen Reactions  . Metronidazole Itching    Unknown  . Ciprofloxacin Itching and Nausea And Vomiting  . Dexilant [Dexlansoprazole] Diarrhea  . Morphine And Related Itching  . Doxycycline Itching, Other (See Comments) and Rash    Redness of skin, burning sensation Unknown     Patient Measurements: Height: 6\' 6"  (198.1 cm) Weight: 92.4 kg (203 lb 11.3 oz) IBW/kg (Calculated) : 91.4 Heparin Dosing Weight: 94 kg  Vital Signs: Temp: 98.3 F (36.8 C) (02/19 0400) Temp Source: Axillary (02/19 0400) BP: 180/75 (02/18 2000) Pulse Rate: 96 (02/19 0500)  Labs: Recent Labs    04/06/20 1222 04/06/20 1230 04/06/20 1645 04/06/20 1645 04/06/20 1751 04/06/20 2307 04/07/20 0030 04/07/20 0823 04/07/20 1442 04/08/20 0438 04/09/20 0524  HGB  --   --   --    < >  --   --  9.2*  --   --  10.5* 10.4*  HCT  --   --   --   --   --   --  28.3*  --   --  31.1* 31.8*  PLT  --   --   --   --   --   --  187  --   --  153 160  APTT 34  --   --   --   --   --   --   --   --   --   --   LABPROT 14.0  --   --   --   --   --   --   --   --   --   --   INR 1.1  --   --   --   --   --   --   --   --   --   --   HEPARINUNFRC  --    < > 0.12*  --   --   --  0.21* 0.34 0.35 0.43 0.29*  CREATININE  --   --   --    < > 0.31*  --  0.32* 0.38*  --  0.39*  --   TROPONINIHS  --    < > 15  --  16 15  --   --   --   --   --    < > = values in this interval not displayed.    Estimated Creatinine Clearance: 125.4 mL/min (A) (by C-G formula based on SCr of 0.39 mg/dL (L)).  Assessment: 60 y/o M admitted with respiratory failure due to COVID-19 PNA developed atrial fibrillation with rapid ventricular response. Pharmacy consulted to initiate heparin infusion. Last dose of prophylaxis Lovenox was last PM.   04/09/2020 6:04 AM  Heparin level 0.29 (slightly subtherapeutic) on heparin infusion at  1800 units/hr  PLT WNL Hgb stable today 35.3  No complications of therapy noted  Goal of Therapy:  Heparin level 0.3-0.7 units/ml Monitor platelets by anticoagulation protocol: Yes   Plan:  Increase heparin infusion to 1900 units/hr Check anti-Xa level daily Monitor CBC closely and s/s of bleeding  Leone Haven, PharmD 04/09/2020,6:04 AM

## 2020-04-09 NOTE — Progress Notes (Signed)
[place] for Heparin Infusion Indication: atrial fibrillation  Allergies  Allergen Reactions  . Metronidazole Itching    Unknown  . Ciprofloxacin Itching and Nausea And Vomiting  . Dexilant [Dexlansoprazole] Diarrhea  . Morphine And Related Itching  . Doxycycline Itching, Other (See Comments) and Rash    Redness of skin, burning sensation Unknown     Patient Measurements: Height: 6\' 6"  (198.1 cm) Weight: 92.4 kg (203 lb 11.3 oz) IBW/kg (Calculated) : 91.4 Heparin Dosing Weight: 94 kg  Vital Signs: Temp: 98.3 F (36.8 C) (02/19 0400) Temp Source: Axillary (02/19 0400) BP: 180/75 (02/18 2000) Pulse Rate: 96 (02/19 0500)  Labs: Recent Labs    04/06/20 1222 04/06/20 1230 04/06/20 1645 04/06/20 1645 04/06/20 1751 04/06/20 2307 04/07/20 0030 04/07/20 0823 04/07/20 1442 04/08/20 0438 04/09/20 0524  HGB  --   --   --    < >  --   --  9.2*  --   --  10.5* 10.4*  HCT  --   --   --   --   --   --  28.3*  --   --  31.1* 31.8*  PLT  --   --   --   --   --   --  187  --   --  153 160  APTT 34  --   --   --   --   --   --   --   --   --   --   LABPROT 14.0  --   --   --   --   --   --   --   --   --   --   INR 1.1  --   --   --   --   --   --   --   --   --   --   HEPARINUNFRC  --    < > 0.12*  --   --   --  0.21* 0.34 0.35 0.43 0.29*  CREATININE  --   --   --    < > 0.31*  --  0.32* 0.38*  --  0.39*  --   TROPONINIHS  --    < > 15  --  16 15  --   --   --   --   --    < > = values in this interval not displayed.    Estimated Creatinine Clearance: 125.4 mL/min (A) (by C-G formula based on SCr of 0.39 mg/dL (L)).  Assessment: 60 y/o M admitted with respiratory failure due to COVID-19 PNA developed atrial fibrillation with rapid ventricular response. Pharmacy consulted to initiate heparin infusion. Last dose of prophylaxis Lovenox was last PM.   04/09/2020 6:25 AM  Heparin level 0.29 below goal  CBC stable  Infusion not paused  per RN  No issues with infusion/line  No bleeding per RN  Goal of Therapy:  Heparin level 0.3-0.7 units/ml Monitor platelets by anticoagulation protocol: Yes   Plan:   Bolus heparin 1500 units iv once  Increase heparin infusion to 2000 unit/hr Check anti-Xa in 6 hours Check anti-Xa level daily Monitor CBC closely and s/s of bleeding  Ulice Dash D  04/09/2020,6:25 AM

## 2020-04-09 NOTE — Progress Notes (Signed)
NAME:  Calvin Cruz, MRN:  [id number], DOB:  April 11, 1960, LOS: 58 ADMISSION DATE:  03/03/2020, CONSULTATION DATE:  03/21/2020 REFERRING MD:  Dr. Manuella Ghazi, [facility], CHIEF COMPLAINT:  Short of breath   Brief History:  60 year old never vaccinated for covid with ARDS secondary to COVID-19 pneumonia, small bowel obstruction/ileus Intubated on 2/5  and transferred to [facility] long hospital  Past Medical History:  Ulcerative colitis s/p colectomy, Gout, CAD s/p DES, HTN, HLD, Nephrolithiasis, DM, Erythrocytosis  Significant Hospital Events:   1/15 Admit to [facility] 1/18 Endoscopic insertion of NG tube 1/20 start remdesivir, steroids 1/24 needing high flow oxygen and NRB 2/01 start TPN 2/5 Intubated and tx to [facility] 2/6 Stop TPN, start tube feeds he has an OG tube 2/8 Weaning benzos, started Precedex 2/10 pneumo-mediastinum, dropped tidal volume 540 cc2/10 pneumomediastinum/subcutaneous emphysema due to barotrauma 2/13 dysynchronous with vent over night/ placed on propofol by elink around  4am but not breathing over vent on am rounds / still on low doses of levophed. 2/13 so increased clonazepam per FT to 2 mg qid as well trial of seroquel 100 mg bid starting 2/13 am  2/14 changed BIS goal to -2 stopped precedex. Added librium and valproic acid. Solumedrol changed to pred. Spiked fever over the day. Got more tachycardic, O2 requirement increased. Sputum culture sent. vanc and cefepime started empirically for HCAP.  2/15 peep requirements up. Recurrent septic shock 2/2 HCAP. On neo as norepi exacerbated tachycardia further. abd more distended c/w recurrent ileus. Had urinary retention foley replaced. Started on urecholine.  2/16 better,PEEP/FIO2 needs down, tachycardia resolved. Ileus better changing FT to gravity from LIWS. Stopping vanc. Starting to wean fent first. New onset Fib w/ RVR and rate in 200s w/ associated hypotension. Attempted cardioversion X 3, each time would  Convert then go back into AF w/  RVR. Loaded w/ amio X 2, gtt started and started on lopressor as well as heparin gtt. CEs negative 2/17 Sinus tach, hypertensive requiring PRN treatment. Midodrine stopped. abd still soft. Starting trickle feeds. ECHO ejection fraction, by estimation, is 50 to 55%. The left ventricle has low normal function. Left ventricular endocardial border not optimally defined to evaluate regional wall motion. Left ventricular diastolic parameters were normal. Lopressor increased. Aline placed given concern about accuracy IV lasix administered. Had to stop lantus due to hypoglycemia. Seen by palliative. Goals of care discussed. Still wanted aggressive care 2/18 pushing diuresis. Remains in NSR. Requiring intermittent Neo but felt drug related. Lopressor dosing decreased. Asked nursing to preferentially wean fent and changed RASS goal to -2 to -3 2/19 Off pressors this morning. However intermittently has hypotension requiring vasopressors.  Consults:  Gastroenterology s/o 1/28 Surgery s/o 1/19  Procedures:  Rt PICC 2/01 >>   Significant Diagnostic Tests:   CT abd/pelvis 1/16 >> lower lungs clear, mildly dilated air and fluid-filled loops of ileum measuring up to 3.2 cm. No clear transition point or focal wall narrowing  CT angio chest 1/28 >> diffuse b/l airspace opacity consistent with COVID 19 pneumonia  CT chest WO 2/10 >> diffuse pneumo mediastinum and subcutaneous emphysema, extensive bilateral groundglass disease ECHO 2/16: ejection fraction, by estimation, is 50 to 55%. The left ventricle has low normal function. Left ventricular endocardial border not optimally defined to evaluate regional wall motion. Left ventricular diastolic parameters were normal. Micro Data:  COVID PCR 1/15 >> Positive Flu PCR 1/15 >> negative Urine culture 2/2-pansensitive E. coli  Antimicrobials:  Remdesivir 1/20 >> 1/24 Steroids 1/20 >>  Baricitinib  1/27 >> 2/6 Unasyn 2/5 >>  2/12 Cefepime 2/14>>> vanc  2/14>>>2/16  Interim History / Subjective:  Off pressors this morning. However intermittently has hypotension requiring vasopressors.  Objective   Blood pressure (!) 180/75, pulse 96, temperature 98.3 F (36.8 C), temperature source Axillary, resp. rate (!) 28, height 6\' 6"  (1.981 m), weight 92.4 kg, SpO2 (!) 86 %.    Vent Mode: PRVC FiO2 (%):  [70 %-80 %] 70 % Set Rate:  [26 bmp] 26 bmp Vt Set:  [540 mL] 540 mL PEEP:  [10 cmH20] 10 cmH20 Plateau Pressure:  [30 cmH20-34 cmH20] 32 cmH20   Intake/Output Summary (Last 24 hours) at 04/09/2020 0729 Last data filed at 04/09/2020 0400 Gross per 24 hour  Intake 2058.88 ml  Output 3950 ml  Net -1891.12 ml   Filed Weights   04/07/20 0417 04/08/20 0416 04/09/20 0500  Weight: 95.8 kg 96.8 kg 92.4 kg     Physical Exam: General: Chronically ill-appearing, no acute distress HENT: [place], AT, ETT in place Eyes: EOMI, no scleral icterus Respiratory: Coarse breath sounds to auscultation bilaterally (improved).  No crackles, wheezing or rales Cardiovascular: RRR, -M/R/G, no JVD GI: Hypoactive BS (improved compared to yesterday), soft, nontender Extremities:-Edema,-tenderness Neuro: Sedated GU: Foley in place    Resolved Hospital Problem list   Presented also with gastroenteritis and increased stool volumes on presentation-->resolved SBO/ileus-->resolved  E coli UTI-->treated and resolved. (completed Rx 2/12) Hyponatremia  Septic shock 2/2 HCAP -->resolved and off pressors as of 2/17 Assessment & Plan:   Acute Hypoxic Respiratory failure w/ ARDS from COVID 19 pneumonia; complicated by HCAP 6/01 (NOS) Remains on high O2 requirement on ventilator. Pplat 28-30 Plan: --ARDSnet protocol: low PEEP strategy, target Pplat <30, goal SpO2 88-95% --PAD protocol for RASS goal -2 and -3: Propofol and Fentanyl --Diurese as able. CFB +8.8L today --Continue pred to 20mg . Titrated down on 2/18 --Complete 7 days of Cefepime (end date 2/20)  Drug induced  Hypotension/ was Hypertensive 2/17.  Doubt sepsis with absence of fever and leukocytosis. Off pressors this morning Plan: --DC metoprolol 2/18 --DC Urochol. Can restart when foley cath removed --Vasopressor support as needed to maintain SBP >09  Acute metabolic encephalopathy secondary to delirium Agitation on mechanical ventilation Plan: --PAD protocol as above --Librium 25 mg QID --Klonopin 2mg  QID --Seroquel 100 mg BID --Valproic acid 250 mg QID  Afib w/ RVR ->currently NSR after amiodarone an bb added Plan: --Telemetry --Cont IV heparin --Cont amiodarone infusion --Holding lopressor dosing due to hypotension --Keep Mg > 2 and K >4. Repleted today  H/o CAD (DES 2016), HLD, HFrEF (EF slightly reduced to 50%)  Plan: --IV lasix --ASA --Cont crestor and zetia --Brilinta to be added back eventually (had been on maintenance dosing) holding due to pending trach   Fluid and electrolyte imbalance: hypokalemia Plan: --Replete  History of ulcerative colitis. W/ recurrent SBO (2/2 h/o adhesions) -abd more distended on 2/15; stopped tubefeeds and placed FT to LIWS. His abd is soft again and is tolerating tube feeds again. 2/18 KUB with improving distension Plan --Will cont tube feeds. Increase rate slowly (40 ml/hr) --Start senna  DM type 2 poorly controlled with steroid induced hyperglycemia. -glucose was low while holding tube feeds. Now control is poor again Plan --Will cont ssi --tubefeed coverage --Reluctant to add lantus currently as need to be sure tolerating tubefeeds   Mild anemia of critical illness hgb stable Plan --Trend  --Transfuse for hgb < 7   Best practice (evaluated daily)  Diet:  TF-->resumed (had been held due to ileus) on 2/18 DVT prophylaxis: lovenox GI prophylaxis: protonix Mobility: SBR Disposition: ICU Family:  Discussed daily Code Status: full  Lines/tubes->has PICC and foley-->remove foley 2/14-->replaced 2/15 due to retention.   The  patient is critically ill with multiple organ systems failure and requires high complexity decision making for assessment and support, frequent evaluation and titration of therapies, application of advanced monitoring technologies and extensive interpretation of multiple databases.  Independent Critical Care Time: 35 Minutes.   Rodman Pickle, M.D. [facility] Pulmonary/Critical Care Medicine 04/09/2020 7:29 AM   Please see Amion for pager number to reach on-call Pulmonary and Critical Care Team.

## 2020-04-10 DIAGNOSIS — K56600 Partial intestinal obstruction, unspecified as to cause: Secondary | ICD-10-CM | POA: Diagnosis not present

## 2020-04-10 LAB — HEPARIN LEVEL (UNFRACTIONATED): Heparin Unfractionated: 0.49 IU/mL (ref 0.30–0.70)

## 2020-04-10 LAB — BASIC METABOLIC PANEL
Anion gap: 14 (ref 5–15)
Anion gap: 11 (ref 5–15)
Anion gap: 10 (ref 5–15)
BUN: 23 mg/dL (ref 8–23)
BUN: 27 mg/dL — ABNORMAL HIGH (ref 8–23)
BUN: 26 mg/dL — ABNORMAL HIGH (ref 8–23)
CO2: 38 mmol/L — ABNORMAL HIGH (ref 22–32)
CO2: 41 mmol/L — ABNORMAL HIGH (ref 22–32)
CO2: 43 mmol/L — ABNORMAL HIGH (ref 22–32)
Calcium: 7.6 mg/dL — ABNORMAL LOW (ref 8.9–10.3)
Calcium: 7.7 mg/dL — ABNORMAL LOW (ref 8.9–10.3)
Calcium: 7.7 mg/dL — ABNORMAL LOW (ref 8.9–10.3)
Chloride: 87 mmol/L — ABNORMAL LOW (ref 98–111)
Chloride: 86 mmol/L — ABNORMAL LOW (ref 98–111)
Chloride: 86 mmol/L — ABNORMAL LOW (ref 98–111)
Creatinine, Ser: 0.42 mg/dL — ABNORMAL LOW (ref 0.61–1.24)
Creatinine, Ser: 0.42 mg/dL — ABNORMAL LOW (ref 0.61–1.24)
Creatinine, Ser: 0.52 mg/dL — ABNORMAL LOW (ref 0.61–1.24)
GFR, Estimated: >60 mL/min (ref >60)
GFR, Estimated: >60 mL/min (ref >60)
GFR, Estimated: >60 mL/min (ref >60)
Glucose, Bld: 222 mg/dL — ABNORMAL HIGH (ref 70–99)
Glucose, Bld: 273 mg/dL — ABNORMAL HIGH (ref 70–99)
Glucose, Bld: 296 mg/dL — ABNORMAL HIGH (ref 70–99)
Potassium: 3.4 mmol/L — ABNORMAL LOW (ref 3.5–5.1)
Potassium: 3.8 mmol/L (ref 3.5–5.1)
Potassium: 4.1 mmol/L (ref 3.5–5.1)
Sodium: 139 mmol/L (ref 135–145)
Sodium: 138 mmol/L (ref 135–145)
Sodium: 139 mmol/L (ref 135–145)

## 2020-04-10 LAB — CBC
HCT: 33.6 % — ABNORMAL LOW (ref 39.0–52.0)
Hemoglobin: 11 g/dL — ABNORMAL LOW (ref 13.0–17.0)
MCH: 31.1 pg (ref 26.0–34.0)
MCHC: 32.7 g/dL (ref 30.0–36.0)
MCV: 94.9 fL (ref 80.0–100.0)
Platelets: 173 10*3/uL (ref 150–400)
RBC: 3.54 MIL/uL — ABNORMAL LOW (ref 4.22–5.81)
RDW: 14.3 % (ref 11.5–15.5)
WBC: 7.5 10*3/uL (ref 4.0–10.5)
nRBC: 0.4 % — ABNORMAL HIGH (ref 0.0–0.2)

## 2020-04-10 LAB — GLUCOSE, CAPILLARY
Glucose-Capillary: 209 mg/dL — ABNORMAL HIGH (ref 70–99)
Glucose-Capillary: 256 mg/dL — ABNORMAL HIGH (ref 70–99)
Glucose-Capillary: 256 mg/dL — ABNORMAL HIGH (ref 70–99)
Glucose-Capillary: 290 mg/dL — ABNORMAL HIGH (ref 70–99)
Glucose-Capillary: 184 mg/dL — ABNORMAL HIGH (ref 70–99)
Glucose-Capillary: 147 mg/dL — ABNORMAL HIGH (ref 70–99)

## 2020-04-10 LAB — TRIGLYCERIDES: Triglycerides: 124 mg/dL (ref <150)

## 2020-04-10 MED ORDER — POTASSIUM CHLORIDE 20 MEQ PO PACK
40.0000 meq | PACK | Freq: Once | ORAL | Status: AC
  Administered 2020-04-10: 40 meq
  Filled 2020-04-10: qty 2

## 2020-04-10 MED ORDER — FUROSEMIDE 10 MG/ML IJ SOLN
40.0000 mg | Freq: Two times a day (BID) | INTRAMUSCULAR | Status: DC
  Administered 2020-04-10: 40 mg via INTRAVENOUS
  Filled 2020-04-10: qty 4

## 2020-04-10 MED ORDER — MIDODRINE HCL 5 MG PO TABS
10.0000 mg | ORAL_TABLET | Freq: Three times a day (TID) | ORAL | Status: DC
  Administered 2020-04-10 – 2020-04-11 (×3): 10 mg via ORAL
  Filled 2020-04-10 (×3): qty 2

## 2020-04-10 MED ORDER — INSULIN GLARGINE 100 UNIT/ML ~~LOC~~ SOLN
10.0000 [IU] | Freq: Two times a day (BID) | SUBCUTANEOUS | Status: DC
  Administered 2020-04-10 (×2): 10 [IU] via SUBCUTANEOUS
  Filled 2020-04-10 (×3): qty 0.1

## 2020-04-10 NOTE — Progress Notes (Signed)
NAME:  Calvin Cruz, MRN:  [id number], DOB:  1960/09/08, LOS: 72 ADMISSION DATE:  02/25/2020, CONSULTATION DATE:  03/21/2020 REFERRING MD:  Dr. Manuella Ghazi, [facility], CHIEF COMPLAINT:  Short of breath   Brief History:  60 year old never vaccinated for covid with ARDS secondary to COVID-19 pneumonia, small bowel obstruction/ileus Intubated on 2/5  and transferred to [facility] long hospital  Past Medical History:  Ulcerative colitis s/p colectomy, Gout, CAD s/p DES, HTN, HLD, Nephrolithiasis, DM, Erythrocytosis  Significant Hospital Events:   1/15 Admit to [facility] 1/18 Endoscopic insertion of NG tube 1/20 start remdesivir, steroids 1/24 needing high flow oxygen and NRB 2/01 start TPN 2/5 Intubated and tx to [facility] 2/6 Stop TPN, start tube feeds he has an OG tube 2/8 Weaning benzos, started Precedex 2/10 pneumo-mediastinum, dropped tidal volume 540 cc2/10 pneumomediastinum/subcutaneous emphysema due to barotrauma 2/13 dysynchronous with vent over night/ placed on propofol by elink around  4am but not breathing over vent on am rounds / still on low doses of levophed. 2/13 so increased clonazepam per FT to 2 mg qid as well trial of seroquel 100 mg bid starting 2/13 am  2/14 changed BIS goal to -2 stopped precedex. Added librium and valproic acid. Solumedrol changed to pred. Spiked fever over the day. Got more tachycardic, O2 requirement increased. Sputum culture sent. vanc and cefepime started empirically for HCAP.  2/15 peep requirements up. Recurrent septic shock 2/2 HCAP. On neo as norepi exacerbated tachycardia further. abd more distended c/w recurrent ileus. Had urinary retention foley replaced. Started on urecholine.  2/16 better,PEEP/FIO2 needs down, tachycardia resolved. Ileus better changing FT to gravity from LIWS. Stopping vanc. Starting to wean fent first. New onset Fib w/ RVR and rate in 200s w/ associated hypotension. Attempted cardioversion X 3, each time would  Convert then go back into AF w/  RVR. Loaded w/ amio X 2, gtt started and started on lopressor as well as heparin gtt. CEs negative 2/17 Sinus tach, hypertensive requiring PRN treatment. Midodrine stopped. abd still soft. Starting trickle feeds. ECHO ejection fraction, by estimation, is 50 to 55%. The left ventricle has low normal function. Left ventricular endocardial border not optimally defined to evaluate regional wall motion. Left ventricular diastolic parameters were normal. Lopressor increased. Aline placed given concern about accuracy IV lasix administered. Had to stop lantus due to hypoglycemia. Seen by palliative. Goals of care discussed. Still wanted aggressive care 2/18 pushing diuresis. Remains in NSR. Requiring intermittent Neo but felt drug related. Lopressor dosing decreased. Asked nursing to preferentially wean fent and changed RASS goal to -2 to -3 2/19 Off pressors this morning. However intermittently has hypotension requiring vasopressors.  Consults:  Gastroenterology s/o 1/28 Surgery s/o 1/19  Procedures:  Rt PICC 2/01 >>   Significant Diagnostic Tests:   CT abd/pelvis 1/16 >> lower lungs clear, mildly dilated air and fluid-filled loops of ileum measuring up to 3.2 cm. No clear transition point or focal wall narrowing  CT angio chest 1/28 >> diffuse b/l airspace opacity consistent with COVID 19 pneumonia  CT chest WO 2/10 >> diffuse pneumo mediastinum and subcutaneous emphysema, extensive bilateral groundglass disease ECHO 2/16: ejection fraction, by estimation, is 50 to 55%. The left ventricle has low normal function. Left ventricular endocardial border not optimally defined to evaluate regional wall motion. Left ventricular diastolic parameters were normal. Micro Data:  COVID PCR 1/15 >> Positive Flu PCR 1/15 >> negative Urine culture 2/2-pansensitive E. coli  Antimicrobials:  Remdesivir 1/20 >> 1/24 Steroids 1/20 >>  Baricitinib  1/27 >> 2/6 Unasyn 2/5 >>  2/12 Cefepime 2/14>>> vanc  2/14>>>2/16  Interim History / Subjective:  Episodes of AFRVR responsive to IV lopressor or amio bolus. Currently well-controlled.  Objective   Blood pressure (!) 109/51, pulse (!) 112, temperature 99.2 F (37.3 C), temperature source Axillary, resp. rate (!) 31, height 6\' 6"  (1.981 m), weight 87.6 kg, SpO2 91 %.    Vent Mode: PRVC FiO2 (%):  [80 %-90 %] 90 % Set Rate:  [26 bmp] 26 bmp Vt Set:  [540 mL] 540 mL PEEP:  [10 cmH20] 10 cmH20 Plateau Pressure:  [28 cmH20-38 cmH20] 37 cmH20   Intake/Output Summary (Last 24 hours) at 04/10/2020 1234 Last data filed at 04/10/2020 1200 Gross per 24 hour  Intake 4401.49 ml  Output 7400 ml  Net -2998.51 ml   Filed Weights   04/08/20 0416 04/09/20 0500 04/10/20 0347  Weight: 96.8 kg 92.4 kg 87.6 kg      Physical Exam: General: Chronically ill-appearing, no acute distress HENT: [place], AT, ETT in place Eyes: EOMI, no scleral icterus Respiratory: Coarse breath sounds bilaterally.  No crackles, wheezing or rales Cardiovascular: RRR, -M/R/G, no JVD GI: BS+, soft, nontender Extremities:2+ lower extremity edema,-tenderness Neuro: Sedated GU: Foley in place   Resolved Hospital Problem list   Presented also with gastroenteritis and increased stool volumes on presentation-->resolved SBO/ileus-->resolved  E coli UTI-->treated and resolved. (completed Rx 2/12) Hyponatremia  Septic shock 2/2 HCAP -->resolved and off pressors as of 2/17 Assessment & Plan:   Acute Hypoxic Respiratory failure w/ ARDS from COVID 19 pneumonia; complicated by HCAP 3/81 (NOS) Remains on high O2 requirement on ventilator. Pplat 28-30 Plan: --ARDSnet protocol: low PEEP strategy, target Pplat <30, goal SpO2 88-95% --PAD protocol for RASS goal -2 and -3: Propofol and Fentanyl --Tolerating diuresis. Continue lasix 40 q6h. CFB +5.5L today --Continue pred to 20mg . Titrated down on 2/18 --Complete 7 days of Cefepime (end date 2/20)  Drug induced Hypotension/ was  Hypertensive 2/17.  Doubt sepsis with absence of fever and leukocytosis. Off pressors this morning Plan: --DC Urochol 2/19. Can restart when foley cath removed --Vasopressor support as needed to maintain SBP >82  Acute metabolic encephalopathy secondary to delirium Agitation on mechanical ventilation Plan: --PAD protocol as above --Librium 25 mg QID --Klonopin 2mg  QID --Seroquel 100 mg BID --Valproic acid 250 mg QID  Afib w/ RVR ->currently NSR after amiodarone an bb added -remains an issue Plan: --Telemetry --Cont IV heparin --Cont amiodarone infusion --PO metoprolol 12.5 mg q6h. Can increase if episodes persist --Keep Mg > 2 and K >4. Repleted today  H/o CAD (DES 2016), HLD, HFrEF (EF slightly reduced to 50%)  Plan: --IV lasix --ASA --Cont crestor and zetia --Brilinta to be added back eventually (had been on maintenance dosing) holding due to pending trach   Fluid and electrolyte imbalance: hypokalemia Plan: --Replete  History of ulcerative colitis. W/ recurrent SBO (2/2 h/o adhesions) -abd more distended on 2/15; stopped tubefeeds and placed FT to LIWS. His abd is soft again and is tolerating tube feeds again. 2/18 KUB with improving distension Plan --Will cont tube feeds. Increase rate slowly (40 ml/hr) --On senna  DM type 2 poorly controlled with steroid induced hyperglycemia. -glucose was low while holding tube feeds. Now control is poor again Plan --Will cont ssi --tubefeed coverage --Reluctant to add lantus currently as need to be sure tolerating tubefeeds   Mild anemia of critical illness hgb stable Plan --Trend  --Transfuse for hgb < 7   Best  practice (evaluated daily)  Diet: TF-->resumed (had been held due to ileus) on 2/18 DVT prophylaxis: lovenox GI prophylaxis: protonix Mobility: SBR Disposition: ICU Family:  Discussed daily Code Status: full  Lines/tubes->has PICC and foley-->remove foley 2/14-->replaced 2/15 due to retention.   The  patient is critically ill with multiple organ systems failure and requires high complexity decision making for assessment and support, frequent evaluation and titration of therapies, application of advanced monitoring technologies and extensive interpretation of multiple databases.  Independent Critical Care Time: 35 Minutes.   Rodman Pickle, M.D. [facility] Pulmonary/Critical Care Medicine 04/10/2020 12:34 PM   Please see Amion for pager number to reach on-call Pulmonary and Critical Care Team.

## 2020-04-10 NOTE — Progress Notes (Signed)
[place] for Heparin Infusion Indication: atrial fibrillation  Allergies  Allergen Reactions  . Metronidazole Itching    Unknown  . Ciprofloxacin Itching and Nausea And Vomiting  . Dexilant [Dexlansoprazole] Diarrhea  . Morphine And Related Itching  . Doxycycline Itching, Other (See Comments) and Rash    Redness of skin, burning sensation Unknown     Patient Measurements: Height: 6\' 6"  (198.1 cm) Weight: 87.6 kg (193 lb 2 oz) IBW/kg (Calculated) : 91.4 Heparin Dosing Weight: 94 kg  Vital Signs: Temp: 99.5 F (37.5 C) (02/20 0400) Temp Source: Axillary (02/20 0400) BP: 109/51 (02/20 0007) Pulse Rate: 93 (02/20 0600)  Labs: Recent Labs    04/08/20 0438 04/09/20 0524 04/09/20 1255 04/09/20 1633 04/10/20 0305 04/10/20 0428  HGB 10.5* 10.4*  --   --  11.0*  --   HCT 31.1* 31.8*  --   --  33.6*  --   PLT 153 160  --   --  173  --   HEPARINUNFRC 0.43 0.29* 0.38  --  0.49  --   CREATININE 0.39* <0.30*  --  0.38*  --  0.42*    Estimated Creatinine Clearance: 120.1 mL/min (A) (by C-G formula based on SCr of 0.42 mg/dL (L)).  Assessment: 60 y/o M admitted with respiratory failure due to COVID-19 PNA developed atrial fibrillation with rapid ventricular response, amiodarone drip started. Pharmacy consulted to initiate heparin infusion.  04/10/2020 8:26 AM  Heparin level 0.49, therapeutic on heparin 2000 units/hr  CBC stable  No issues with infusion/line or bleeding reported  Goal of Therapy:  Heparin level 0.3-0.7 units/ml Monitor platelets by anticoagulation protocol: Yes   Plan:    Continue IV heparin infusion at 2000 unit/hr Check anti-Xa level daily Monitor CBC closely and s/s of bleeding  Peggyann Juba, PharmD, BCPS Pharmacy: [phone] 04/10/2020,8:26 AM

## 2020-04-10 NOTE — Progress Notes (Signed)
Pt AM K+ 3.4 with creat 0.42 and GFR > 60. ELink CCM electrolyte protocol initiated.

## 2020-04-10 NOTE — Progress Notes (Signed)
    Chart reviewed and updates received.. Patient remains intubated. Requiring vasopressors in the setting of hypotension.  Attempts made to reach out and follow-up with wife. Mrs. Raysean Graumann. Unable to reach. Voicemail left with contact information given.   Will re-attempt to contact family at a later time/date to engage in continued goals of care discussions and offer support.    Alda Lea, AGPCNP-BC Palliative Medicine Team  Phone: [phone]  No Charge

## 2020-04-11 ENCOUNTER — Inpatient Hospital Stay (HOSPITAL_COMMUNITY): Payer: Commercial Managed Care - PPO

## 2020-04-11 DIAGNOSIS — K56600 Partial intestinal obstruction, unspecified as to cause: Secondary | ICD-10-CM | POA: Diagnosis not present

## 2020-04-11 DIAGNOSIS — U071 COVID-19: Secondary | ICD-10-CM | POA: Diagnosis not present

## 2020-04-11 DIAGNOSIS — I4891 Unspecified atrial fibrillation: Secondary | ICD-10-CM

## 2020-04-11 LAB — GLUCOSE, CAPILLARY
Glucose-Capillary: 176 mg/dL — ABNORMAL HIGH (ref 70–99)
Glucose-Capillary: 303 mg/dL — ABNORMAL HIGH (ref 70–99)
Glucose-Capillary: 278 mg/dL — ABNORMAL HIGH (ref 70–99)
Glucose-Capillary: 188 mg/dL — ABNORMAL HIGH (ref 70–99)
Glucose-Capillary: 185 mg/dL — ABNORMAL HIGH (ref 70–99)
Glucose-Capillary: 215 mg/dL — ABNORMAL HIGH (ref 70–99)

## 2020-04-11 LAB — HEPARIN LEVEL (UNFRACTIONATED): Heparin Unfractionated: 0.67 IU/mL (ref 0.30–0.70)

## 2020-04-11 LAB — BASIC METABOLIC PANEL
Anion gap: 13 (ref 5–15)
Anion gap: 8 (ref 5–15)
BUN: 28 mg/dL — ABNORMAL HIGH (ref 8–23)
BUN: 31 mg/dL — ABNORMAL HIGH (ref 8–23)
CO2: 38 mmol/L — ABNORMAL HIGH (ref 22–32)
CO2: 43 mmol/L — ABNORMAL HIGH (ref 22–32)
Calcium: 7.6 mg/dL — ABNORMAL LOW (ref 8.9–10.3)
Calcium: 8 mg/dL — ABNORMAL LOW (ref 8.9–10.3)
Chloride: 87 mmol/L — ABNORMAL LOW (ref 98–111)
Chloride: 92 mmol/L — ABNORMAL LOW (ref 98–111)
Creatinine, Ser: 0.56 mg/dL — ABNORMAL LOW (ref 0.61–1.24)
Creatinine, Ser: 0.43 mg/dL — ABNORMAL LOW (ref 0.61–1.24)
GFR, Estimated: >60 mL/min (ref >60)
GFR, Estimated: >60 mL/min (ref >60)
Glucose, Bld: 224 mg/dL — ABNORMAL HIGH (ref 70–99)
Glucose, Bld: 238 mg/dL — ABNORMAL HIGH (ref 70–99)
Potassium: 3.4 mmol/L — ABNORMAL LOW (ref 3.5–5.1)
Potassium: 4.1 mmol/L (ref 3.5–5.1)
Sodium: 138 mmol/L (ref 135–145)
Sodium: 143 mmol/L (ref 135–145)

## 2020-04-11 LAB — MAGNESIUM: Magnesium: 1.7 mg/dL (ref 1.7–2.4)

## 2020-04-11 LAB — BLOOD GAS, ARTERIAL
Acid-Base Excess: 14.4 mmol/L — ABNORMAL HIGH (ref 0.0–2.0)
Acid-Base Excess: 15.5 mmol/L — ABNORMAL HIGH (ref 0.0–2.0)
Bicarbonate: 42.9 mmol/L — ABNORMAL HIGH (ref 20.0–28.0)
Bicarbonate: 43.8 mmol/L — ABNORMAL HIGH (ref 20.0–28.0)
Drawn by: 308601
Drawn by: 11249
FIO2: 100
FIO2: 100
MECHVT: 540 mL
MECHVT: 450 mL
O2 Saturation: 81.9 %
O2 Saturation: 87.5 %
PEEP: 10 cmH2O
PEEP: 12 cmH2O
Patient temperature: 101.1
Patient temperature: 100.7
RATE: 26 resp/min
RATE: 28 resp/min
pCO2 arterial: 69.4 mmHg (ref 32.0–48.0)
pCO2 arterial: 80.7 mmHg (ref 32.0–48.0)
pH, Arterial: 7.415 (ref 7.350–7.450)
pH, Arterial: 7.361 (ref 7.350–7.450)
pO2, Arterial: 52.7 mmHg — ABNORMAL LOW (ref 83.0–108.0)
pO2, Arterial: 61.8 mmHg — ABNORMAL LOW (ref 83.0–108.0)

## 2020-04-11 LAB — CBC
HCT: 36.2 % — ABNORMAL LOW (ref 39.0–52.0)
Hemoglobin: 11.7 g/dL — ABNORMAL LOW (ref 13.0–17.0)
MCH: 31.4 pg (ref 26.0–34.0)
MCHC: 32.3 g/dL (ref 30.0–36.0)
MCV: 97.1 fL (ref 80.0–100.0)
Platelets: 269 10*3/uL (ref 150–400)
RBC: 3.73 MIL/uL — ABNORMAL LOW (ref 4.22–5.81)
RDW: 14.6 % (ref 11.5–15.5)
WBC: 19.6 10*3/uL — ABNORMAL HIGH (ref 4.0–10.5)
nRBC: 0.7 % — ABNORMAL HIGH (ref 0.0–0.2)

## 2020-04-11 MED ORDER — DIGOXIN 0.25 MG/ML IJ SOLN
0.5000 mg | Freq: Once | INTRAMUSCULAR | Status: AC
  Administered 2020-04-11: 0.5 mg via INTRAVENOUS
  Filled 2020-04-11: qty 2

## 2020-04-11 MED ORDER — CLONAZEPAM 1 MG PO TABS
2.0000 mg | ORAL_TABLET | Freq: Four times a day (QID) | ORAL | Status: DC
  Administered 2020-04-11 – 2020-04-14 (×14): 2 mg
  Filled 2020-04-11 (×14): qty 2

## 2020-04-11 MED ORDER — INSULIN GLARGINE 100 UNIT/ML ~~LOC~~ SOLN
12.0000 [IU] | Freq: Two times a day (BID) | SUBCUTANEOUS | Status: DC
  Administered 2020-04-11 – 2020-04-12 (×4): 12 [IU] via SUBCUTANEOUS
  Filled 2020-04-11 (×5): qty 0.12

## 2020-04-11 MED ORDER — PHENYLEPHRINE CONCENTRATED 100MG/250ML (0.4 MG/ML) INFUSION SIMPLE
0.0000 ug/min | INTRAVENOUS | Status: DC
  Administered 2020-04-11: 110 ug/min via INTRAVENOUS
  Administered 2020-04-11: 190 ug/min via INTRAVENOUS
  Administered 2020-04-11: 220 ug/min via INTRAVENOUS
  Administered 2020-04-12: 300 ug/min via INTRAVENOUS
  Administered 2020-04-12: 210 ug/min via INTRAVENOUS
  Administered 2020-04-12 (×2): 360 ug/min via INTRAVENOUS
  Administered 2020-04-13: 350 ug/min via INTRAVENOUS
  Administered 2020-04-13: 300 ug/min via INTRAVENOUS
  Administered 2020-04-13: 380 ug/min via INTRAVENOUS
  Administered 2020-04-13 (×2): 350 ug/min via INTRAVENOUS
  Administered 2020-04-14: 280 ug/min via INTRAVENOUS
  Administered 2020-04-14: 400 ug/min via INTRAVENOUS
  Administered 2020-04-14: 330 ug/min via INTRAVENOUS
  Administered 2020-04-14 – 2020-04-15 (×2): 400 ug/min via INTRAVENOUS
  Filled 2020-04-11 (×19): qty 250

## 2020-04-11 MED ORDER — DIGOXIN 0.25 MG/ML IJ SOLN
0.2500 mg | Freq: Four times a day (QID) | INTRAMUSCULAR | Status: AC
  Administered 2020-04-11 (×2): 0.25 mg via INTRAVENOUS
  Filled 2020-04-11 (×2): qty 2

## 2020-04-11 MED ORDER — AMIODARONE IV BOLUS ONLY 150 MG/100ML
150.0000 mg | Freq: Once | INTRAVENOUS | Status: AC
  Administered 2020-04-11: 150 mg via INTRAVENOUS
  Filled 2020-04-11: qty 100

## 2020-04-11 MED ORDER — POTASSIUM CHLORIDE 20 MEQ PO PACK
40.0000 meq | PACK | Freq: Once | ORAL | Status: AC
  Administered 2020-04-11: 40 meq
  Filled 2020-04-11: qty 2

## 2020-04-11 MED ORDER — MIDODRINE HCL 5 MG PO TABS
10.0000 mg | ORAL_TABLET | Freq: Three times a day (TID) | ORAL | Status: DC
  Administered 2020-04-11 – 2020-04-14 (×10): 10 mg
  Filled 2020-04-11 (×10): qty 2

## 2020-04-11 MED ORDER — SENNOSIDES 8.8 MG/5ML PO SYRP
5.0000 mL | ORAL_SOLUTION | Freq: Every day | ORAL | Status: DC
  Administered 2020-04-11 – 2020-04-14 (×3): 5 mL
  Filled 2020-04-11 (×5): qty 5

## 2020-04-11 NOTE — Progress Notes (Addendum)
[facility] [facility] Progress Note Patient Name: Calvin Cruz DOB: 11-17-1960 MRN: [id number]   Date of Service  04/11/2020  HPI/Events of Note  Hypoxia - Sat = 79% on 100%/P 10. Know history of pneumomediastinum.   eICU Interventions  Plan: 1. Portable CXR STAT.      Intervention Category Major Interventions: Hypoxemia - evaluation and management  Giannie Soliday Eugene 04/11/2020, 12:28 AM

## 2020-04-11 NOTE — Progress Notes (Signed)
[facility] Progress Note Patient Name: Calvin Cruz DOB: 26-Jan-1961 MRN: [id number]   Date of Service  04/11/2020  HPI/Events of Note  Hypoxia - Review of portable CXR reveals lines and tubes well positioned. Persistent widespread pulmonary infiltrates with increasing consolidation in the right lower lung.  eICU Interventions  Plan: 1. Increase PEEP to 12     Intervention Category Major Interventions: Hypoxemia - evaluation and management  Deaira Leckey Eugene 04/11/2020, 1:28 AM

## 2020-04-11 NOTE — Progress Notes (Signed)
AM K+ 3.4 with creat 0.56 and GFR > 60. ELink CCM electrolyte protocol initiated.

## 2020-04-11 NOTE — Progress Notes (Signed)
E-Link provider notified of patient's HR post Amio bolus.

## 2020-04-11 NOTE — Progress Notes (Signed)
Date and time results received: 04/11/20 0254 Test:CO2  Critical Value: 80.7   Name of Provider Notified: Notified E-Link provider via Arloa Koh, RN

## 2020-04-11 NOTE — TOC Progression Note (Signed)
Transition of Care [facility]) - Progression Note    Patient Details  Name: Calvin Cruz MRN: [id number] Date of Birth: [birth year]  [facility] CM/SW Contact  Leeroy Cha, RN Phone Number: 04/11/2020, 9:15 AM  Clinical Narrative:    60 year old never vaccinated for covid with ARDS secondary to COVID-19 pneumonia, small bowel obstruction/ileus Intubated on 2/5  and transferred to [facility] long hospital  Past Medical History:  Ulcerative colitis s/p colectomy, Gout, CAD s/p DES, HTN, HLD, Nephrolithiasis, DM, Erythrocytosis  Significant Hospital Events:   1/15 Admit to [facility] 1/18 Endoscopic insertion of NG tube 1/20 start remdesivir, steroids 1/24 needing high flow oxygen and NRB 2/01 start TPN 2/5 Intubated and tx to [facility] 2/6 Stop TPN, start tube feeds he has an OG tube 2/8 Weaning benzos, started Precedex 2/10 pneumo-mediastinum, dropped tidal volume 540 cc2/10 pneumomediastinum/subcutaneous emphysema due to barotrauma 2/13 dysynchronous with vent over night/ placed on propofol by elink around  4am but not breathing over vent on am rounds / still on low doses of levophed. 2/13 so increased clonazepam per FT to 2 mg qid as well trial of seroquel 100 mg bid starting 2/13 am  2/14 changed BIS goal to -2 stopped precedex. Added librium and valproic acid. Solumedrol changed to pred. Spiked fever over the day. Got more tachycardic, O2 requirement increased. Sputum culture sent. vanc and cefepime started empirically for HCAP.  2/15 peep requirements up. Recurrent septic shock 2/2 HCAP. On neo as norepi exacerbated tachycardia further. abd more distended c/w recurrent ileus. Had urinary retention foley replaced. Started on urecholine.  2/16 better,PEEP/FIO2 needs down, tachycardia resolved. Ileus better changing FT to gravity from LIWS. Stopping vanc. Starting to wean fent first. New onset Fib w/ RVR and rate in 200s w/ associated hypotension. Attempted cardioversion X 3,  each time would  Convert then go back into AF w/ RVR. Loaded w/ amio X 2, gtt started and started on lopressor as well as heparin gtt. CEs negative 2/17 Sinus tach, hypertensive requiring PRN treatment. Midodrine stopped. abd still soft. Starting trickle feeds. ECHO ejection fraction, by estimation, is 50 to 55%. The left ventricle has low normal function. Left ventricular endocardial border not optimally defined to evaluate regional wall motion. Left ventricular diastolic parameters were normal. Lopressor increased. Aline placed given concern about accuracy IV lasix administered. Had to stop lantus due to hypoglycemia. Seen by palliative. Goals of care discussed. Still wanted aggressive care 2/18 pushing diuresis. Remains in NSR. Requiring intermittent Neo but felt drug related. Lopressor dosing decreased. Asked nursing to preferentially wean fent and changed RASS goal to -2 to -3 2/19 Off pressors this morning. However intermittently has hypotension requiring vasopressors. 2/21 AF RVR ongoing with rates in 150s, BB limited by hypotension requiring pressors.  Plan: following for progression for needs     Barriers to Discharge: Continued Medical Work up  Expected Discharge Plan and Services                                                 Social Determinants of Health (SDOH) Interventions    Readmission Risk Interventions No flowsheet data found.

## 2020-04-11 NOTE — Progress Notes (Signed)
CXR overnight showed no evidence of pneumothorax. Patient responding to increased PEEP. Pplat 36 at 6 cc/kg/IBW (560 cc VT). Will decrease to 5 cc/kg/IBW (450 cc/kg/IBW) for PPlat 30. Increase RR to 28 to compensate for decreased tidal volume to maintain MV at 12.   Repeat ABG in 30 min for further readjustment.  Bennie Pierini, MD 04/11/20 2:05 AM

## 2020-04-11 NOTE — Progress Notes (Signed)
eLink Physician-Brief Progress Note Patient Name: Calvin Cruz DOB: [birth year] MRN: [id number]   Date of Service  04/11/2020  HPI/Events of Note  Hypotension - SBP = 98. Patient due to get Metoprolol dose.   eICU Interventions  Hold Metoprolol dose for SBP < 100 or HR , 60.      Intervention Category Major Interventions: Hypotension - evaluation and management  Ziah Leandro Eugene 04/11/2020, 6:02 AM

## 2020-04-11 NOTE — Progress Notes (Addendum)
NAME:  Calvin Cruz, MRN:  [id number], DOB:  04/28/1960, LOS: 52 ADMISSION DATE:  03/01/2020, CONSULTATION DATE:  03/21/2020 REFERRING MD:  Dr. Manuella Ghazi, [facility], CHIEF COMPLAINT:  Short of breath   Brief History:  60 year old never vaccinated for covid with ARDS secondary to COVID-19 pneumonia, small bowel obstruction/ileus Intubated on 2/5  and transferred to [facility] long hospital  Past Medical History:  Ulcerative colitis s/p colectomy, Gout, CAD s/p DES, HTN, HLD, Nephrolithiasis, DM, Erythrocytosis  Significant Hospital Events:   1/15 Admit to [facility] 1/18 Endoscopic insertion of NG tube 1/20 start remdesivir, steroids 1/24 needing high flow oxygen and NRB 2/01 start TPN 2/5 Intubated and tx to [facility] 2/6 Stop TPN, start tube feeds he has an OG tube 2/8 Weaning benzos, started Precedex 2/10 pneumo-mediastinum, dropped tidal volume 540 cc2/10 pneumomediastinum/subcutaneous emphysema due to barotrauma 2/13 dysynchronous with vent over night/ placed on propofol by elink around  4am but not breathing over vent on am rounds / still on low doses of levophed. 2/13 so increased clonazepam per FT to 2 mg qid as well trial of seroquel 100 mg bid starting 2/13 am  2/14 changed BIS goal to -2 stopped precedex. Added librium and valproic acid. Solumedrol changed to pred. Spiked fever over the day. Got more tachycardic, O2 requirement increased. Sputum culture sent. vanc and cefepime started empirically for HCAP.  2/15 peep requirements up. Recurrent septic shock 2/2 HCAP. On neo as norepi exacerbated tachycardia further. abd more distended c/w recurrent ileus. Had urinary retention foley replaced. Started on urecholine.  2/16 better,PEEP/FIO2 needs down, tachycardia resolved. Ileus better changing FT to gravity from LIWS. Stopping vanc. Starting to wean fent first. New onset Fib w/ RVR and rate in 200s w/ associated hypotension. Attempted cardioversion X 3, each time would  Convert then go back into AF w/  RVR. Loaded w/ amio X 2, gtt started and started on lopressor as well as heparin gtt. CEs negative 2/17 Sinus tach, hypertensive requiring PRN treatment. Midodrine stopped. abd still soft. Starting trickle feeds. ECHO ejection fraction, by estimation, is 50 to 55%. The left ventricle has low normal function. Left ventricular endocardial border not optimally defined to evaluate regional wall motion. Left ventricular diastolic parameters were normal. Lopressor increased. Aline placed given concern about accuracy IV lasix administered. Had to stop lantus due to hypoglycemia. Seen by palliative. Goals of care discussed. Still wanted aggressive care 2/18 pushing diuresis. Remains in NSR. Requiring intermittent Neo but felt drug related. Lopressor dosing decreased. Asked nursing to preferentially wean fent and changed RASS goal to -2 to -3 2/19 Off pressors this morning. However intermittently has hypotension requiring vasopressors. 2/21 AF RVR ongoing with rates in 150s, BB limited by hypotension requiring pressors.   Consults:  Gastroenterology s/o 1/28 Surgery s/o 1/19  Procedures:  Rt PICC 2/01 >>   Significant Diagnostic Tests:   CT abd/pelvis 1/16 >> lower lungs clear, mildly dilated air and fluid-filled loops of ileum measuring up to 3.2 cm. No clear transition point or focal wall narrowing  CT angio chest 1/28 >> diffuse b/l airspace opacity consistent with COVID 19 pneumonia  CT chest WO 2/10 >> diffuse pneumo mediastinum and subcutaneous emphysema, extensive bilateral groundglass disease ECHO 2/16: ejection fraction, by estimation, is 50 to 55%. The left ventricle has low normal function. Left ventricular endocardial border not optimally defined to evaluate regional wall motion. Left ventricular diastolic parameters were normal. Micro Data:  COVID PCR 1/15 >> Positive Flu PCR 1/15 >> negative Urine culture  2/2-pansensitive E. coli  Antimicrobials:  Remdesivir 1/20 >> 1/24 Steroids  1/20 >>  Baricitinib 1/27 >> 2/6 Unasyn 2/5 >>  2/12 Cefepime 2/14>>> vanc 2/14>>>2/16  Interim History / Subjective:  Hypoxia overnight. PEEP increased. Vt dropped to 5cc/Kg IBW to maintain plat. AF RVR ongoing, unresponsive to amiodarone bolus overnight.   Objective   Blood pressure (!) 132/53, pulse (!) 147, temperature 99.5 F (37.5 C), temperature source Axillary, resp. rate (!) 26, height 6\' 6"  (1.981 m), weight 92.3 kg, SpO2 93 %.    Vent Mode: PRVC FiO2 (%):  [90 %-100 %] 100 % Set Rate:  [26 bmp-28 bmp] 28 bmp Vt Set:  [450 mL-540 mL] 450 mL PEEP:  [10 cmH20-12 cmH20] 12 cmH20 Plateau Pressure:  [27 cmH20-42 cmH20] 34 cmH20   Intake/Output Summary (Last 24 hours) at 04/11/2020 5361 Last data filed at 04/11/2020 4431 Gross per 24 hour  Intake 6092.21 ml  Output 3100 ml  Net 2992.21 ml   Filed Weights   04/09/20 0500 04/10/20 0347 04/11/20 0500  Weight: 92.4 kg 87.6 kg 92.3 kg      Physical Exam:  General: ill appearing middle aged male on vent.  HEENT: NCAT, PERRL, no JVD, ETT Respiratory: Bilateral coarse breath sounds.  Cardiovascular:RRR, no MRG GI: Soft, non-distended, Normoactive.  Extremities: Trace lower extremity edema.  Neuro: Sedated RASS -3.  GU: Foley in place   Resolved Hospital Problem list   Presented also with gastroenteritis and increased stool volumes on presentation-->resolved SBO/ileus-->resolved  E coli UTI-->treated and resolved. (completed Rx 2/12) Hyponatremia  Septic shock 2/2 HCAP -->resolved and off pressors as of 2/17. Completed 7 days of Cefepime on 2/20 Assessment & Plan:   Acute Hypoxic Respiratory failure w/ ARDS from COVID 19 pneumonia; complicated by HCAP 5/40 (NOS) Remains on high O2 requirement on ventilator. Pplat 30 Ongoing cuff leak Plan: --ARDSnet protocol: 5cc/Kg, PEEP 12, f28, 100% --PAD protocol with Propofol and fentanyl for RASS goal -2 to -3.  --Tolerating diuresis. Continue lasix 40 q6h. --Continue pred  20mg . Titrated down on 2/18 --RT to place air in cuff, when mechanics improve we should change tube out.   Drug induced Hypotension/ was Hypertensive 2/17.  Plan: --DC Urochol 2/19. Can restart when foley cath removed --Phenylephrine infusion as needed to maintain SBP >90. Avoiding NE due to AF RVR.   Acute metabolic encephalopathy secondary to delirium Agitation on mechanical ventilation Plan: --PAD protocol as above --Librium 25 mg QID --Klonopin 2mg  QID --Seroquel 100 mg BID --Valproic acid 250 mg QID  Afib w/ RVR: Difficult to manage. BB limited due to hypotension, not responding well to amiodarone. Electrical cardioversion x 3 on 2/16 with no benefit.  Plan: --Telemetry --Heparin per pharmacy --Amiodarone infusion ongoing --PO metoprolol 12.5 mg q6h.  Limited admin due to hypotension.  --Dig load 0.5mg  --Cardiology consulted.  --Keep Mg > 2 and K >4. Repleted today by Elink  H/o CAD (DES 2016), HLD, HFrEF (EF slightly reduced to 50%)  Plan: --IV lasix on hold due to hypotension --ASA --Continue crestor and zetia --Brilinta to be added back eventually (had been on maintenance dosing) holding due to pending trach   Fluid and electrolyte imbalance: hypokalemia Plan: -- Trend BMP and correct as indicated.   Leukocytosis: finished a course of cefepime 2/20, but now 2/21 he has WBC elevation to 19 and Tmax 101.7. Question of increasing RLL opacification, but seems marginal at best on my read of CXR.  -- pan-culture 2/21  History of ulcerative colitis. W/  recurrent SBO (2/2 h/o adhesions) -abd more distended on 2/15; stopped tubefeeds and placed FT to LIWS. His abd is soft again and is tolerating tube feeds again. 2/18 KUB with improving distension Plan --Continue tube feeds. Increase rate slowly (50 ml/hr) --On senna  DM type 2 poorly controlled with steroid induced hyperglycemia. - glucose was low while holding tube feeds. Now control is poor again Plan --CBG  monitoring with SSI resistant scale --Lantus 12 units BID --Need to increase lantus and schedule tube feed coverage.  --Consult to DM coordinator for further guidance.    Mild anemia of critical illness hgb stable Plan --Trend  --Transfuse for hgb < 7  Best practice (evaluated daily)  Diet: TF DVT prophylaxis: heparin infusion for AF GI prophylaxis: protonix Mobility: Bedrest Disposition: ICU Family:  Discussed daily Code Status: full  Lines/tubes->has PICC and foley-->removed foley 2/14-->replaced 2/15 due to retention.    Critical care time 103 minutses  Georgann Housekeeper, AGACNP-BC [facility]  See Amion for personal pager PCCM on call pager [phone] until 7pm. Please call Elink 7p-7a. [phone]  04/11/2020 7:38 AM

## 2020-04-11 NOTE — Consult Note (Addendum)
Cardiology Consultation:   Patient ID: Calvin Cruz MRN: [id number]; DOB: 09/09/1960  Admit date: 03/04/2020 Date of Consult: 04/11/2020  PCP:  South, Stephen, MD   [facility]  Cardiologist:  David Harding, MD  Advanced Practice Provider:  No care team member to display Electrophysiologist:  None  [id number]   Patient Profile:   Calvin Cruz is a 59 y.o. male with a hx of CAD (NSTEMI with LCx PCI 2016), ulcerative colitis s/p ilioanal anastamosis, DM, HTN, gout, HLD, hypertriglyceridemia who is being seen today for the evaluation of afib at the request of Paul Hoffman, NP.  History of Present Illness:   Calvin Cruz is followed by Dr. Harding as an outpatient with prior PCI to the LCx in 2016; otherwise had nonobstructive CAD with a jailed OM, 50 stenosed, and 40% prox-mid LAD at that time. EF was normal.   He has been admitted for 36 days with Covid-19 infection and critical illness. He initially presented to the [facility] 03/11/2020 with nausea, vomiting and diarrhea. He had not received Covid vaccination. He was found to have Covid-19 PNA and partial SBO. He was treated with remdesivir and steroids along with NG tube. His respiratory status progressed to ARDS from Covid-19 PNA. He was initially treated with high flow O2/NRB but on 2/5 required intubation and was transferred to [facility]. Hospital admission also notable for pneumomediastinum, development of fever with HCAP and septic shock, acute metabolic encephaloapthy, abdominal distention c/w recurrent ileus, and urinary retention requiring foley catheter placement. During admission he was also noted to have had paroxysmal atrial fib on 2/16 with associated hypotension - he was cardioverted x 3 by PCCM with reports that each time he would go back into atrial fib but notes outline that he went back to sinus tach after 3rd attempt. He was stated on IV amiodarone, Lopressor and heparin. Has intermittently required  pressors. He continues to have issues with recurrent atrial arrhythmias therefore PCCM ordered 0.5mg IV digoxin and cardiology consulted. Per review of telemetry it appears he went into what appears to be 2:1 atrial flutter shortly after 1am. Full echo 2/16 with EF 55-60%, limited echo 2/16 with EF 50-55% with mild AS otherwise unrevealing but some structures not well visualized. He remains intubated and sedated. Per d/w nurse, historically when sedation is turned down the patient has had issues complying with the ventilator. Remains on phenylephrine and midodrine at this time for pressure support to maintain SBP>90.  Past Medical History:  Diagnosis Date  . CAD S/P DES PCI-circumflex 07/22/2014   PCI mCx: PCI: 2.75 x 18 mm Xience Alpine DES to mid LCx at takeoff of OM1 (OM1 jailed 50% stenosis)  . Diabetes (HCC)   . Erythrocytosis 11/28/2012  . Essential hypertension   . Fatigue 11/28/2012  . Gout 2014  . Hyperlipidemia with target LDL less than 70   . Hypertriglyceridemia 11/16/2014  . Kidney stones   . Non-STEMI (non-ST elevated myocardial infarction) (HCC) 07/22/2014   a) RCA: OK, LM: Okay ; LAD: Proximal to mid 40%; RI: 30%;LCx: Mid to distal 90%, distal 20%, OM1 50% --> DES PCI  . Ulcerative colitis    s/p ilioanal anastomosis (colectomy) ; has had multiple episodes of pouchitis.    Past Surgical History:  Procedure Laterality Date  . APPENDECTOMY    . CARDIAC CATHETERIZATION N/A 07/23/2014   Procedure: Left Heart Cath and Coronary Angiography;  Surgeon: David W Harding, MD;  Location: [facility];  Service: Cardiovascular;    RCA: Mild disease; LM: Okay ; LAD: Proximal to mid 40%; RI: 30%;LCx: Mid to distal 90%, distal 20%, OM1 50%; Mid inferolateral HK, EF 55-65%   . CARDIAC CATHETERIZATION N/A 07/23/2014   Procedure: Coronary Stent Intervention;  Surgeon: Leonie Man, MD;  Location: [place] [facility];  Service: Cardiovascular; PCI: 2.75 x 18 mm Xience Alpine DES to mid LCx at  takeoff of OM1 (OM1 jailed 50% stenosis)  . CHOLECYSTECTOMY    . COLECTOMY    . ESOPHAGOGASTRODUODENOSCOPY (EGD) WITH PROPOFOL N/A 03/16/2020   Procedure: ESOPHAGOGASTRODUODENOSCOPY (EGD) WITH PROPOFOL WITH PLACEMENT OF NASO-GASTRIC TUBE;  Surgeon: Harvel Quale, MD;  Location: [facility];  Service: Gastroenterology;  Laterality: N/A;  . HERNIA REPAIR     umbilical  . illeoanalanastomosis  04/2001, 11/2001, 03/2202   has a J pouch  . TRANSTHORACIC ECHOCARDIOGRAM  07/22/2014   EF 55-60%, normal wall motion, normal diastolic function,  no valve lesions      Home Medications:  Prior to Admission medications   Medication Sig Start Date End Date Taking? Authorizing Provider  acetaminophen (TYLENOL) 325 MG tablet Take 650 mg by mouth every 6 (six) hours as needed for mild pain.   Yes [provider]  BRILINTA 60 MG TABS tablet Take 1 tablet by mouth twice daily Patient taking differently: Take 60 mg by mouth 2 (two) times daily. 03/03/20  Yes Leonie Man, MD  Cholecalciferol 100 MCG (4000 UT) CAPS Take 1 capsule by mouth every morning.    Yes [provider]  Coenzyme Q10 (COQ10) 100 MG CAPS Take 100 mg by mouth daily. GRADUALLY INCREASE TO 300 MG DAILY Patient taking differently: Take 100 mg by mouth every morning. 11/17/14  Yes Leonie Man, MD  diclofenac sodium (VOLTAREN) 1 % GEL Apply 2 g topically daily as needed (for pain).  09/14/15  Yes [provider]  diphenoxylate-atropine (LOMOTIL) 2.5-0.025 MG tablet Take 1 tablet by mouth 4 (four) times daily as needed for diarrhea or loose stools. Reported on 09/09/2015 Patient taking differently: Take 2 tablets by mouth in the morning and at bedtime. *May also take one table as needed for diarrhea/loose stools 11/22/15  Yes Rehman, Mechele Dawley, MD  empagliflozin (JARDIANCE) 10 MG TABS tablet Take 10 mg by mouth every morning.    Yes [provider]  ezetimibe (ZETIA) 10 MG tablet Take 1 tablet (10  mg total) by mouth daily. 08/27/16  Yes Leonie Man, MD  gabapentin (NEURONTIN) 600 MG tablet Take 1 tablet (600 mg total) by mouth at bedtime. 02/05/20  Yes Dickie La, MD  Lactobacillus (PROBIOTIC ACIDOPHILUS) CAPS Take 2 capsules by mouth daily.   Yes [provider]  metoprolol succinate (TOPROL-XL) 25 MG 24 hr tablet Take 12.5 mg by mouth every evening. 03/19/19  Yes [provider]  nitroGLYCERIN (NITROSTAT) 0.4 MG SL tablet Place 1 tablet (0.4 mg total) under the tongue every 5 (five) minutes x 3 doses as needed for chest pain. 10/11/17  Yes Duke, Tami Lin, PA  omeprazole (PRILOSEC OTC) 20 MG tablet Take 20 mg by mouth daily as needed (for acid reflux).    Yes [provider]  OVER THE COUNTER MEDICATION Take 2 tablets by mouth daily. Patient takes 2 by mouth daily - Vita - Sprout Capsules - Multi Vitamin   Yes [provider]  rosuvastatin (CRESTOR) 10 MG tablet Take 1 tablet twice weekly on Monday and Friday Patient taking differently: Take 10 mg by mouth  2 (two) times a week. Take 1 tablet twice weekly on Monday and Friday 05/07/16  Yes Harding, David W, MD  Turmeric 500 MG CAPS Take 1 capsule by mouth daily.   Yes [provider]  ULORIC 40 MG tablet Take 40 mg by mouth every morning.  05/09/15  Yes [provider]  VASCEPA 1 g capsule Take 2 capsules by mouth twice daily Patient taking differently: Take 2 g by mouth 2 (two) times daily. 03/03/20  Yes Harding, David W, MD    Inpatient Medications: Scheduled Meds: . aspirin  325 mg Per Tube Daily  . chlordiazePOXIDE  25 mg Per Tube QID  . chlorhexidine gluconate (MEDLINE KIT)  15 mL Mouth Rinse BID  . Chlorhexidine Gluconate Cloth  6 each Topical Daily  . cholecalciferol  5,000 Units Per Tube Daily  . clonazePAM  2 mg Oral QID  . diclofenac Sodium  4 g Topical QID  . digoxin  0.5 mg Intravenous Once  . docusate  100 mg Per Tube BID  . ezetimibe  10 mg Per Tube Daily  .  feeding supplement (PROSource TF)  45 mL Per Tube TID  . free water  100 mL Per Tube Q4H  . furosemide  40 mg Intravenous Q12H  . insulin aspart  0-20 Units Subcutaneous Q4H  . insulin glargine  12 Units Subcutaneous BID  . ipratropium-albuterol  3 mL Nebulization TID  . mouth rinse  15 mL Mouth Rinse 10 times per day  . metoCLOPramide (REGLAN) injection  5 mg Intravenous Q6H  . metoprolol tartrate  12.5 mg Oral Q6H  . midodrine  10 mg Oral TID WC  . pantoprazole sodium  40 mg Per Tube BID  . polyethylene glycol  17 g Per Tube Daily  . predniSONE  20 mg Per Tube Daily  . QUEtiapine  100 mg Per Tube BID  . rosuvastatin  10 mg Per Tube Daily  . senna  1 tablet Oral Daily  . sodium chloride flush  10-40 mL Intracatheter Q12H  . valproic acid  250 mg Per Tube QID   Continuous Infusions: . sodium chloride    . amiodarone Stopped (04/11/20 0141)  . feeding supplement (PIVOT 1.5 CAL) 1,000 mL (04/10/20 1139)  . fentaNYL infusion INTRAVENOUS 100 mcg/hr (04/11/20 0700)  . heparin 2,000 Units/hr (04/11/20 0700)  . norepinephrine (LEVOPHED) Adult infusion Stopped (04/09/20 1051)  . phenylephrine (NEO-SYNEPHRINE) Adult infusion 200 mcg/min (04/11/20 0700)  . propofol (DIPRIVAN) infusion 15 mcg/kg/min (04/11/20 0700)  . vasopressin Stopped (04/08/20 1704)   PRN Meds: Place/Maintain arterial line **AND** sodium chloride, acetaminophen (TYLENOL) oral liquid 160 mg/5 mL, fentaNYL  Allergies:    Allergies  Allergen Reactions  . Metronidazole Itching    Unknown  . Ciprofloxacin Itching and Nausea And Vomiting  . Dexilant [Dexlansoprazole] Diarrhea  . Morphine And Related Itching  . Doxycycline Itching, Other (See Comments) and Rash    Redness of skin, burning sensation Unknown     Social History:   Social History   Socioeconomic History  . Marital status: Married    Spouse name: Kim  . Number of children: 1  . Years of education: Not on file  . Highest education level: Not on  file  Occupational History  . Occupation: maintencnce  Tobacco Use  . Smoking status: Former Smoker    Packs/day: 1.00    Years: 25.00    Pack years: 25.00    Types: Cigarettes    Quit date: 12/22/2001      Years since quitting: 18.3  . Smokeless tobacco: Never Used  Vaping Use  . Vaping Use: Never used  Substance and Sexual Activity  . Alcohol use: No    Alcohol/week: 0.0 standard drinks  . Drug use: No  . Sexual activity: Not on file  Other Topics Concern  . Not on file  Social History Narrative   One Son and 2 adopted grandkids   Social Determinants of Health   Financial Resource Strain: Not on file  Food Insecurity: Not on file  Transportation Needs: Not on file  Physical Activity: Not on file  Stress: Not on file  Social Connections: Not on file  Intimate Partner Violence: Not on file    Family History:    Family History  Problem Relation Age of Onset  . Diabetes Mother   . Coronary artery disease Mother   . Cancer Father 68       lung, smoker  . Diabetes Brother   . Colon cancer Neg Hx   . Esophageal cancer Neg Hx   . Rectal cancer Neg Hx      ROS:  Please see the history of present illness.   All other ROS reviewed and negative.     Physical Exam/Data:   Vitals:   04/11/20 0630 04/11/20 0730 04/11/20 0800 04/11/20 0835  BP:    (!) 96/55  Pulse: (!) 147 (!) 143 (!) 144 (!) 151  Resp: (!) 26 (!) 31 (!) 32 (!) 28  Temp:   (!) 100.9 F (38.3 C)   TempSrc:   Axillary   SpO2: 93% 91% 91% 92%  Weight:      Height:        Intake/Output Summary (Last 24 hours) at 04/11/2020 0911 Last data filed at 04/11/2020 0700 Gross per 24 hour  Intake 5449.11 ml  Output 3100 ml  Net 2349.11 ml   Last 3 Weights 04/11/2020 04/10/2020 04/09/2020  Weight (lbs) 203 lb 7.8 oz 193 lb 2 oz 203 lb 11.3 oz  Weight (kg) 92.3 kg 87.6 kg 92.4 kg     Body mass index is 23.51 kg/m.  Vital Signs. BP (!) 96/55   Pulse (!) 151   Temp (!) 100.9 F (38.3 C) (Axillary)   Resp  (!) 28   Ht 6' 6" (1.981 m)   Wt 92.3 kg   SpO2 92%   BMI 23.51 kg/m  General: Critically ill appearing adult M in no acute distress. Head: Normocephalic, atraumatic, sclera non-icteric, no xanthomas, nares are without discharge. Neck: Negative for carotid bruits. JVP not elevated. Lungs: Coarse bilaterally on vent without wheezes, rales, or rhonchi Heart: Regular, tachycardic, S1 S2 without murmurs, rubs, or gallops.  Abdomen: Soft, non-tender, non-distended with normoactive bowel sounds. No rebound/guarding. Extremities: No clubbing or cyanosis. Trace BLE edema. Distal pedal pulses are 2+ and equal bilaterally. Neuro: Sedated Psych: Unable to assess  EKG:  The EKG was personally reviewed and demonstrates:  Multiple tracings reviewed. Most recently 2/17 - NSR 80bpm no acute STT changes and QTc wnl When in rapid afib on 2/16 had associated diffuse ST depression that was particularly pronounced in V3-V6   Telemetry:  Telemetry was personally reviewed and demonstrates: currently appears atrial flutter 2:1 conduction  Relevant CV Studies: Limited echo 04/06/20  1. Left ventricular ejection fraction, by estimation, is 50 to 55%. The  left ventricle has low normal function. Left ventricular endocardial  border not optimally defined to evaluate regional wall motion. Left  ventricular diastolic parameters were  normal.    2. Right ventricular systolic function is normal. The right ventricular  size is normal. Tricuspid regurgitation signal is inadequate for assessing  PA pressure.  3. The mitral valve is normal in structure. No evidence of mitral valve  regurgitation.  4. The aortic valve is tricuspid. Aortic valve regurgitation is trivial.  Mild aortic valve stenosis.  5. The inferior vena cava is normal in size with greater than 50%  respiratory variability, suggesting right atrial pressure of 3 mmHg.   Complete echo 03/24/20   1. Left ventricular ejection fraction, by  estimation, is 55 to 60%. The  left ventricle has normal function. The left ventricle has no regional  wall motion abnormalities. There is moderate left ventricular hypertrophy.  Left ventricular diastolic  parameters are indeterminate.  2. Right ventricular systolic function is low normal. The right  ventricular size is normal. Tricuspid regurgitation signal is inadequate  for assessing PA pressure.  3. The mitral valve is grossly normal. Trivial mitral valve  regurgitation.  4. The aortic valve is tricuspid. There is mild calcification of the  aortic valve. Aortic valve regurgitation is not visualized.  5. The inferior vena cava is normal in size with greater than 50%  respiratory variability, suggesting right atrial pressure of 3 mmHg.   Laboratory Data:  High Sensitivity Troponin:   Recent Labs  Lab 04/06/20 1230 04/06/20 1645 04/06/20 1751 04/06/20 2307  TROPONINIHS _0 Chemistry Recent Labs  Lab 04/10/20 1132 04/10/20 1622 04/11/20 0145  NA 138 139 138  K 3.8 4.1 3.4*  CL 86* 86* 87*  CO2 41* 43* 38*  GLUCOSE 273* 296* 224*  BUN 27* 26* 28*  CREATININE 0.42* 0.52* 0.56*  CALCIUM 7.7* 7.7* 7.6*  GFRNONAA >60 >60 >60  ANIONGAP _1 Recent Labs  Lab 04/07/20 0030 04/08/20 0438 04/09/20 0524  PROT 5.0* 5.7* 5.7*  ALBUMIN 2.0* 2.0* 2.0*  AST _2 ALT 25 28 33  ALKPHOS 40 54 54  BILITOT 0.6 0.7 0.7   Hematology Recent Labs  Lab 04/09/20 0524 04/10/20 0305 04/11/20 0446  WBC 6.3 7.5 19.6*  RBC 3.34* 3.54* 3.73*  HGB 10.4* 11.0* 11.7*  HCT 31.8* 33.6* 36.2*  MCV 95.2 94.9 97.1  MCH 31.1 31.1 31.4  MCHC 32.7 32.7 32.3  RDW 14.0 14.3 14.6  PLT 160 173 269   BNPNo results for input(s): BNP, PROBNP in the last 168 hours.  DDimer No results for input(s): DDIMER in the last 168 hours.   Radiology/Studies:  DG Abd 1 View  Result Date: 04/08/2020 CLINICAL DATA:  Ileus EXAM: ABDOMEN - 1 VIEW COMPARISON:  04/06/2020  FINDINGS: NG tube is in the stomach. Decreasing bowel distension. Gas throughout a few mildly prominent small bowel loops. No organomegaly or free air. Prior cholecystectomy. IMPRESSION: Improving small bowel distention. Electronically Signed   By: Rolm Baptise M.D.   On: 04/08/2020 07:29   DG CHEST PORT 1 VIEW  Result Date: 04/11/2020 CLINICAL DATA:  Hypoxia.  Ventilator support. EXAM: PORTABLE CHEST 1 VIEW COMPARISON:  04/09/2020 FINDINGS: Endotracheal tube tip is 3 cm above the carina. Orogastric or nasogastric tube enters the abdomen. Widespread pulmonary infiltrates persist, with increasing consolidation in the right lower lung. Right arm PICC tip in the SVC above the right atrium. IMPRESSION: Lines and tubes well positioned. Persistent widespread pulmonary infiltrates with increasing consolidation in the right lower lung. Electronically Signed   By: Jan Fireman.D.  On: 04/11/2020 01:04   DG Chest Port 1 View  Result Date: 04/09/2020 CLINICAL DATA:  ARDS EXAM: PORTABLE CHEST 1 VIEW COMPARISON:  Yesterday FINDINGS: Endotracheal tube with tip at the clavicular heads. The enteric tube at least reaches the stomach. Right PICC with tip at the upper cavoatrial junction. Confluent airspace disease. Normal heart size. No visible effusion or pneumothorax. Extensive artifact from EKG leads IMPRESSION: Stable hardware positioning and bilateral pneumonia. Electronically Signed   By: Jonathon  Watts M.D.   On: 04/09/2020 04:53   DG Chest Port 1 View  Result Date: 04/08/2020 CLINICAL DATA:  Respiratory failure EXAM: PORTABLE CHEST 1 VIEW COMPARISON:  04/06/2020 FINDINGS: For endotracheal tube and NG tube are unchanged. Diffuse bilateral airspace disease, unchanged. No visible effusions or pneumothorax. Heart is normal size. IMPRESSION: Severe diffuse bilateral airspace disease, unchanged. Electronically Signed   By: Kevin  Dover M.D.   On: 04/08/2020 07:30     Assessment and Plan:   1. Acute hypoxic  respiratory with ARDS from Covid-19 PNA, complicated by development of HCAP with ongoing hypotension - requiring critical care life support measures that are ongoing - currently on midodrine and phenylephrine support - primary team initiating trial dose of Lasix this afternoon, lyte management of hypokalemia - still with fever 100.9 this AM  2. Acute metabolic encephalopathy - per primary team  3. Paroxysmal atrial fib/flutter - s/p DCCV x 3 on 2/16, reported no benefit at that time - nurse note reports went back into sinus tach after 3rd attempt - was started on IV amiodarone/Lopressor/heparin at that time - does appear he has been intermittently sinus since that time - went into what appears to be atrial flutter 2:1 conduction overnight with rate 150, now in Afib with rate 130s - BP limits aggressive medication titration, still requiring pressors + midodrine - continue IV amiodarone & heparin per pharmacy - trial of digoxin planned by PCCM  4. H/o CAD - on ASA - maintenance Brilinta on hold for now, may consider holding altogether going forward as he may require eventual transition to DOAC pending clinical course - receiving Zetia/Crestor per tube, IV BB ordered q6hr but held this AM - initial hSTroponins were negative on admission - EKG did show diffuse ST depressions in the context of rapid rates, but EKG was benign when repeated in NSR - EF low-normal, mild AS by last echo  Risk Assessment/Risk Scores:       CHA2DS2-VASc Score = 3  This indicates a 3.2% annual risk of stroke. The patient's score is based upon: CHF History: No HTN History: Yes Diabetes History: Yes Stroke History: No Vascular Disease History: Yes Age Score: 0 Gender Score: 0      For questions or updates, please contact [facility] Please consult [email or web address] for contact info under    Signed,  N , PA-C  04/11/2020 9:11 AM  Patient seen and examined.  Agree with above documentation.   Calvin Cruz is 59-year-old male with a history of CAD status post LCx PCI in 2016, ulcerative colitis status post ileoanal anastomosis, hypertension, T2DM, hyperlipidemia who is currently admitted with COVID-19 infection and we are consulted to see by Paul Hoffman, NP for evaluation of atrial fibrillation.  He was admitted on 03/18/2020 with COVID-19 infection.  Found to have COVID-19 pneumonia and partial SBO.  He developed ARDS and required intubation on 2/5.  He has had a complicated hospital course that has included development of HCAP, septic shock, pneumomediastinum, recurrent ileus, acute metabolic encephalopathy.    He was noted to have an episode of atrial fibrillation on 2/16.  He became hypotensive during episode and was cardioverted x3.  He eventually converted to sinus tachycardia following the third cardioversion.  He was started on amiodarone, heparin, metoprolol.  Echo 2/16 showed EF 50 to 55%, mild AS.  On exam, patient is intubated, sedated, not following commands, irregular rhythm, tachycardic, no murmurs, coarse breath sounds, no LE edema.  EKG shows atrial flutter with 2 1 conduction, rate 149.  Telemetry personally reviewed and shows patient went into atrial flutter at 1 AM last night, rate 150, currently in atrial fibrillation with rate 130s.  For his atrial fibrillation/flutter, continue IV heparin for anticoagulation.  Suspect driven by acute illness, as he continues to recover from COVID-19 pneumonia complicated by ARDS and septic shock.  He is on phenylephrine 220 mcg/min.  Would continue IV amiodarone.  Hold off on beta-blocker or calcium channel blocker as remains on pressors.  He was given a dose of IV digoxin 0.5 mg this morning.  Will discuss with pharmacy and plan to load with digoxin today to help with rate control.  Christopher L Schumann, MD   

## 2020-04-11 NOTE — Progress Notes (Signed)
[facility] Progress Note Patient Name: Calvin Cruz DOB: 07-Dec-1960 MRN: [id number]   Date of Service  04/11/2020  HPI/Events of Note  ABG on 100%/PRVC 28/TV 450/P 12 = 7.361/80.7/61.8  eICU Interventions  Continue present ventilator management.      Intervention Category Major Interventions: Respiratory failure - evaluation and management  [name] 04/11/2020, 3:18 AM

## 2020-04-11 NOTE — Progress Notes (Signed)
[facility] for Heparin Infusion Indication: atrial fibrillation  Allergies  Allergen Reactions  . Metronidazole Itching    Unknown  . Ciprofloxacin Itching and Nausea And Vomiting  . Dexilant [Dexlansoprazole] Diarrhea  . Morphine And Related Itching  . Doxycycline Itching, Other (See Comments) and Rash    Redness of skin, burning sensation Unknown     Patient Measurements: Height: 6\' 6"  (198.1 cm) Weight: 92.3 kg (203 lb 7.8 oz) IBW/kg (Calculated) : 91.4 Heparin Dosing Weight: 94 kg  Vital Signs: Temp: 99.5 F (37.5 C) (02/21 0400) Temp Source: Axillary (02/21 0400) BP: 132/53 (02/20 2314) Pulse Rate: 147 (02/21 0630)  Labs: Recent Labs    04/09/20 0524 04/09/20 1255 04/09/20 1633 04/10/20 0305 04/10/20 0428 04/10/20 1132 04/10/20 1622 04/11/20 0145 04/11/20 0446  HGB 10.4*  --   --  11.0*  --   --   --   --  11.7*  HCT 31.8*  --   --  33.6*  --   --   --   --  36.2*  PLT 160  --   --  173  --   --   --   --  269  HEPARINUNFRC 0.29* 0.38  --  0.49  --   --   --   --  0.67  CREATININE <0.30*  --    < >  --    < > 0.42* 0.52* 0.56*  --    < > = values in this interval not displayed.    Estimated Creatinine Clearance: 125.4 mL/min (A) (by C-G formula based on SCr of 0.56 mg/dL (L)).  Assessment: 60 y/o M admitted with respiratory failure due to COVID-19 PNA developed atrial fibrillation with rapid ventricular response, amiodarone drip started. Pharmacy consulted to initiate heparin infusion.  Today, 04/11/2020   Heparin level 0.67, increased to near upper end of range, but remains therapeutic on heparin 2000 units/hr  CBC stable: Hgb increased to 11.7, Plt increased to 269  No issues with infusion/line or bleeding reported  Remains on ASA 325mg   Goal of Therapy:  Heparin level 0.3-0.7 units/ml Monitor platelets by anticoagulation protocol: Yes   Plan:    Continue IV heparin infusion at 2000 unit/hr Check anti-Xa  level daily Monitor CBC closely and s/s of bleeding  Gretta Arab PharmD, BCPS Clinical Pharmacist [facility] main pharmacy [phone] 04/11/2020 7:21 AM

## 2020-04-11 NOTE — Progress Notes (Signed)
[facility] Progress Note Patient Name: Calvin Cruz DOB: 1960-07-20 MRN: [id number]   Date of Service  04/11/2020  HPI/Events of Note  AFIB with RVR - Ventricular rate = 151. BP = 132/53. Currently on an Amiodarone IV infusion.   eICU Interventions  Plan: 1. Bolus with Amiodarone 150 mg IV over 10 minutes now. 2. BMP and Mg++ level STAT.      Intervention Category Major Interventions: Arrhythmia - evaluation and management  Sonnia Strong Eugene 04/11/2020, 1:31 AM

## 2020-04-11 NOTE — Progress Notes (Signed)
Per d/w Dr. Gardiner Rhyme, he recommends to complete digoxin load for today - have spoken with pharmacy for dosing. We'll follow up again tomorrow to reassess daily dosing requirement.

## 2020-04-12 DIAGNOSIS — I4891 Unspecified atrial fibrillation: Secondary | ICD-10-CM

## 2020-04-12 DIAGNOSIS — U071 COVID-19: Secondary | ICD-10-CM | POA: Diagnosis not present

## 2020-04-12 DIAGNOSIS — K56600 Partial intestinal obstruction, unspecified as to cause: Secondary | ICD-10-CM | POA: Diagnosis not present

## 2020-04-12 DIAGNOSIS — J80 Acute respiratory distress syndrome: Secondary | ICD-10-CM | POA: Diagnosis not present

## 2020-04-12 DIAGNOSIS — I4892 Unspecified atrial flutter: Secondary | ICD-10-CM

## 2020-04-12 DIAGNOSIS — R579 Shock, unspecified: Secondary | ICD-10-CM | POA: Diagnosis not present

## 2020-04-12 LAB — BASIC METABOLIC PANEL
Anion gap: 7 (ref 5–15)
Anion gap: 9 (ref 5–15)
BUN: 30 mg/dL — ABNORMAL HIGH (ref 8–23)
BUN: 33 mg/dL — ABNORMAL HIGH (ref 8–23)
CO2: 43 mmol/L — ABNORMAL HIGH (ref 22–32)
CO2: 41 mmol/L — ABNORMAL HIGH (ref 22–32)
Calcium: 8.1 mg/dL — ABNORMAL LOW (ref 8.9–10.3)
Calcium: 7.3 mg/dL — ABNORMAL LOW (ref 8.9–10.3)
Chloride: 94 mmol/L — ABNORMAL LOW (ref 98–111)
Chloride: 93 mmol/L — ABNORMAL LOW (ref 98–111)
Creatinine, Ser: 0.31 mg/dL — ABNORMAL LOW (ref 0.61–1.24)
Creatinine, Ser: 0.53 mg/dL — ABNORMAL LOW (ref 0.61–1.24)
GFR, Estimated: >60 mL/min (ref >60)
GFR, Estimated: >60 mL/min (ref >60)
Glucose, Bld: 137 mg/dL — ABNORMAL HIGH (ref 70–99)
Glucose, Bld: 301 mg/dL — ABNORMAL HIGH (ref 70–99)
Potassium: 3.7 mmol/L (ref 3.5–5.1)
Potassium: 4 mmol/L (ref 3.5–5.1)
Sodium: 144 mmol/L (ref 135–145)
Sodium: 143 mmol/L (ref 135–145)

## 2020-04-12 LAB — BLOOD CULTURE ID PANEL (REFLEXED) - BCID2

## 2020-04-12 LAB — PHOSPHORUS: Phosphorus: 1.7 mg/dL — ABNORMAL LOW (ref 2.5–4.6)

## 2020-04-12 LAB — GLUCOSE, CAPILLARY
Glucose-Capillary: 144 mg/dL — ABNORMAL HIGH (ref 70–99)
Glucose-Capillary: 159 mg/dL — ABNORMAL HIGH (ref 70–99)
Glucose-Capillary: 245 mg/dL — ABNORMAL HIGH (ref 70–99)
Glucose-Capillary: 270 mg/dL — ABNORMAL HIGH (ref 70–99)
Glucose-Capillary: 249 mg/dL — ABNORMAL HIGH (ref 70–99)
Glucose-Capillary: 258 mg/dL — ABNORMAL HIGH (ref 70–99)

## 2020-04-12 LAB — CBC
HCT: 32.6 % — ABNORMAL LOW (ref 39.0–52.0)
Hemoglobin: 10.2 g/dL — ABNORMAL LOW (ref 13.0–17.0)
MCH: 31.6 pg (ref 26.0–34.0)
MCHC: 31.3 g/dL (ref 30.0–36.0)
MCV: 100.9 fL — ABNORMAL HIGH (ref 80.0–100.0)
Platelets: 249 10*3/uL (ref 150–400)
RBC: 3.23 MIL/uL — ABNORMAL LOW (ref 4.22–5.81)
RDW: 14.9 % (ref 11.5–15.5)
WBC: 16.5 10*3/uL — ABNORMAL HIGH (ref 4.0–10.5)
nRBC: 0.5 % — ABNORMAL HIGH (ref 0.0–0.2)

## 2020-04-12 LAB — URINE CULTURE: Culture: NO GROWTH

## 2020-04-12 LAB — MAGNESIUM: Magnesium: 2.2 mg/dL (ref 1.7–2.4)

## 2020-04-12 LAB — HEPARIN LEVEL (UNFRACTIONATED): Heparin Unfractionated: 0.64 IU/mL (ref 0.30–0.70)

## 2020-04-12 MED ORDER — DIGOXIN 125 MCG PO TABS: 0.1250 mg | ORAL_TABLET | Freq: Every day | ORAL | Status: DC

## 2020-04-12 MED ORDER — VANCOMYCIN HCL 1750 MG/350ML IV SOLN
1750.0000 mg | Freq: Once | INTRAVENOUS | Status: AC
  Administered 2020-04-12: 1750 mg via INTRAVENOUS
  Filled 2020-04-12: qty 350

## 2020-04-12 MED ORDER — HYDROCORTISONE NA SUCCINATE PF 100 MG IJ SOLR
50.0000 mg | Freq: Four times a day (QID) | INTRAMUSCULAR | Status: DC
  Administered 2020-04-12 – 2020-04-15 (×11): 50 mg via INTRAVENOUS
  Filled 2020-04-12 (×11): qty 2

## 2020-04-12 MED ORDER — VANCOMYCIN HCL 1000 MG/200ML IV SOLN
1000.0000 mg | Freq: Two times a day (BID) | INTRAVENOUS | Status: DC
  Administered 2020-04-13: 1000 mg via INTRAVENOUS
  Filled 2020-04-12: qty 200

## 2020-04-12 MED ORDER — POTASSIUM PHOSPHATES 15 MMOLE/5ML IV SOLN
30.0000 mmol | Freq: Once | INTRAVENOUS | Status: AC
  Administered 2020-04-12: 30 mmol via INTRAVENOUS
  Filled 2020-04-12: qty 10

## 2020-04-12 MED ORDER — FUROSEMIDE 10 MG/ML IJ SOLN: 40.0000 mg | Freq: Once | INTRAMUSCULAR | Status: DC

## 2020-04-12 MED ORDER — SODIUM CHLORIDE 0.9 % IV SOLN
1.0000 g | Freq: Three times a day (TID) | INTRAVENOUS | Status: DC
  Administered 2020-04-12 – 2020-04-13 (×3): 1 g via INTRAVENOUS
  Filled 2020-04-12 (×4): qty 1

## 2020-04-12 MED ORDER — DIGOXIN 0.25 MG/ML IJ SOLN
0.1250 mg | Freq: Every day | INTRAMUSCULAR | Status: DC
  Administered 2020-04-12 – 2020-04-13 (×2): 0.125 mg via INTRAVENOUS
  Filled 2020-04-12 (×2): qty 2

## 2020-04-12 MED ORDER — INSULIN ASPART 100 UNIT/ML ~~LOC~~ SOLN
2.0000 [IU] | SUBCUTANEOUS | Status: DC
  Administered 2020-04-12 – 2020-04-13 (×6): 2 [IU] via SUBCUTANEOUS

## 2020-04-12 MED ORDER — FUROSEMIDE 10 MG/ML IJ SOLN
40.0000 mg | Freq: Once | INTRAMUSCULAR | Status: AC
  Administered 2020-04-12: 40 mg via INTRAVENOUS
  Filled 2020-04-12: qty 4

## 2020-04-12 MED ORDER — VASOPRESSIN 20 UNITS/100 ML INFUSION FOR SHOCK
0.0000 [IU]/min | INTRAVENOUS | Status: DC
  Administered 2020-04-12 – 2020-04-14 (×7): 0.03 [IU]/min via INTRAVENOUS
  Filled 2020-04-12 (×12): qty 100

## 2020-04-12 NOTE — Progress Notes (Signed)
Daily Progress Note   Patient Name: Calvin Cruz       Date: 04/12/2020 DOB: [birth year]  Age: 60 y.o. MRN#: [id number] Attending Physician: Margaretha Seeds, MD Primary Care Physician: Reynold Bowen, MD Admit Date: 03/07/2020  Reason for Consultation/Follow-up: Establishing goals of care  Subjective: Chart Reviewed. Updates Received.   Patient prognosis remains poor. Continues to require max vent support.   Spoke with wife at length. She acknowledges recent discussions with CCM, NP and verbalizes understanding of patient's poor prognosis and high likelihood of hospital death.   Mrs. Elk continues to request full scope care sharing she is remaining hopeful for God's intervention, patient to respond, and for the medical interventions to give him an opportunity to show some sort of positive response.   I continued discussions regarding full code status with consideration of his current critical condition and high risk of sudden death. Education provided on what DNR would look like for patient with understanding that did not indicate the medical team would withdraw present care, but does provide direction if patient was to pass away despite all measures no heroic interventions would be performed (chest compression/ACLS). Mrs. henery betzold understanding and expresses she would not want him to suffer or be traumatized. We discussed if patient was to pass away despite interventions that may be God's way of calling him "home" and to try and revive him would be intervening. She verbalized understanding and expressed she would like to further think about codes status.   We discussed her son who is currently stationed in [place] in the [employer]. She is worried that he may not be able to make it home in the event patient does approach end-of-life. She shares previously on admission the medical team at [facility] spoke to the [employer] and expressed patient was not end-of-life and the request was  cancelled to have her son sent home. I shared with Mrs. Wilcock given the criticalness of patient it would be appropriate for son to be home or preparing to come home depending on how that process takes. She states he was on the phone earlier when updates were being provided and they will continue to have ongoing discussions. She is planning to reach out to [employer] to see what steps need to be taken at this point. I offered support and she knows to let myself or the medical team know of any needs from [place].   All questions answered and support provided.   Length of Stay: 37 days  Vital Signs: BP 139/65   Pulse (!) 108   Temp 98 F (36.7 C) (Axillary)   Resp (!) 30   Ht 6\' 6"  (1.981 m)   Wt 92.3 kg   SpO2 93%   BMI 23.51 kg/m  SpO2: SpO2: 93 % O2 Device: O2 Device: Ventilator O2 Flow Rate: O2 Flow Rate (L/min): 50 L/min   Palliative Care Assessment & Plan   Goals of Care/Recommendations: Full Scop/Full Code Wife continues to request watchful waiting. Remains hopeful despite multiple discussions from providers including myself.  Discussed at length full code status. Wife wishes to think about decisions.  PMT will continue to support and follow as needed.   Prognosis: POOR   Thank you for allowing the Palliative Medicine Team to assist in the care of this patient.  Time Total: 45 min.   Visit consisted of counseling and education dealing with the complex and emotionally intense issues of symptom management and palliative care in the setting of serious  and potentially life-threatening illness.Greater than 50%  of this time was spent counseling and coordinating care related to the above assessment and plan.  Alda Lea, AGPCNP-BC  Palliative Medicine Team [phone]

## 2020-04-12 NOTE — Progress Notes (Signed)
[facility] for Heparin Infusion Indication: atrial fibrillation  Allergies  Allergen Reactions  . Metronidazole Itching    Unknown  . Ciprofloxacin Itching and Nausea And Vomiting  . Dexilant [Dexlansoprazole] Diarrhea  . Morphine And Related Itching  . Doxycycline Itching, Other (See Comments) and Rash    Redness of skin, burning sensation Unknown     Patient Measurements: Height: 6\' 6"  (198.1 cm) Weight: 92.3 kg (203 lb 7.8 oz) IBW/kg (Calculated) : 91.4 Heparin Dosing Weight: 94 kg  Vital Signs: Temp: 101 F (38.3 C) (02/22 0756) Temp Source: Axillary (02/22 0756) BP: 139/65 (02/22 0500) Pulse Rate: 117 (02/22 0730)  Labs: Recent Labs    04/10/20 0305 04/10/20 0428 04/11/20 0145 04/11/20 0446 04/11/20 1915 04/12/20 0447  HGB 11.0*  --   --  11.7*  --  10.2*  HCT 33.6*  --   --  36.2*  --  32.6*  PLT 173  --   --  269  --  249  HEPARINUNFRC 0.49  --   --  0.67  --  0.64  CREATININE  --    < > 0.56*  --  0.43* 0.31*   < > = values in this interval not displayed.    Estimated Creatinine Clearance: 125.4 mL/min (A) (by C-G formula based on SCr of 0.31 mg/dL (L)).  Assessment: 60 y/o M admitted with respiratory failure due to COVID-19 PNA developed atrial fibrillation with rapid ventricular response, amiodarone drip started. Pharmacy consulted to initiate heparin infusion.  Today, 04/12/2020   Heparin level 0.64, remains therapeutic on heparin 2000 units/hr  CBC stable: Hgb down to 10.2, Plt WNL  No issues with infusion/line or bleeding reported  Remains on ASA 325mg   Goal of Therapy:  Heparin level 0.3-0.7 units/ml Monitor platelets by anticoagulation protocol: Yes   Plan:    Continue IV heparin infusion at 2000 unit/hr Check anti-Xa level daily Monitor CBC closely and s/s of bleeding  Gretta Arab PharmD, BCPS Clinical Pharmacist [facility] main pharmacy [phone] 04/12/2020 8:15 AM

## 2020-04-12 NOTE — Progress Notes (Signed)
PCCM INTERVAL PROGRESS NOTE   Blood cultures showing MRSE  Echo Add vanco Will try to push towards line holiday, but seems unlikely as he is requiring tow pressors and has poor IV access otherwise.    Georgann Housekeeper, AGACNP-BC [facility]  See Amion for personal pager PCCM on call pager [phone] until 7pm. Please call Elink 7p-7a. [phone]  04/12/2020 4:47 PM

## 2020-04-12 NOTE — Progress Notes (Signed)
Inpatient Diabetes Program Recommendations  AACE/ADA: New Consensus Statement on Inpatient Glycemic Control (2015)  Target Ranges:  Prepandial:   less than 140 mg/dL      Peak postprandial:   less than 180 mg/dL (1-2 hours)      Critically ill patients:  140 - 180 mg/dL   Lab Results  Component Value Date   GLUCAP 159 (H) 04/12/2020   HGBA1C 6.7 (H) 03/07/2020    Review of Glycemic Control  Diabetes history: DM2 Outpatient Diabetes medications: Jardiance 10 mg QD Current orders for Inpatient glycemic control: Lantus 12 units BID, Novolog 0-20 units Q4H  HgbA1C - 6.7% On Pred 20 mg QD Intubated, receiving TFs  Inpatient Diabetes Program Recommendations:     Add Novolog 2 units Q4H for TF coverage. Do not give if TF held for any reason.  Follow daily.  Thank you. Lorenda Peck, RD, LDN, CDE Inpatient Diabetes Coordinator [phone]

## 2020-04-12 NOTE — Progress Notes (Signed)
PHARMACY - PHYSICIAN COMMUNICATION CRITICAL VALUE ALERT - BLOOD CULTURE IDENTIFICATION (BCID)  Calvin Cruz is an 60 y.o. male who presented to [facility] on 02/24/2020   Assessment: BCID: Methicillin resistant staph epi in 1/4 bottles  Name of physician (or Provider) Contacted: Dr. Loanne Drilling  Current antibiotics: Meropenem 1g IV q12h.  Changes to prescribed antibiotics recommended:  Add Vancomycin per pharmacy dosing due to septic shock  Results for orders placed or performed during the hospital encounter of 03/15/2020  Blood Culture ID Panel (Reflexed) (Collected: 04/11/2020 11:58 AM)  Result Value Ref Range   Enterococcus faecalis NOT DETECTED NOT DETECTED   Enterococcus Faecium NOT DETECTED NOT DETECTED   Listeria monocytogenes NOT DETECTED NOT DETECTED   Staphylococcus species DETECTED (A) NOT DETECTED   Staphylococcus aureus (BCID) NOT DETECTED NOT DETECTED   Staphylococcus epidermidis DETECTED (A) NOT DETECTED   Staphylococcus lugdunensis NOT DETECTED NOT DETECTED   Streptococcus species NOT DETECTED NOT DETECTED   Streptococcus agalactiae NOT DETECTED NOT DETECTED   Streptococcus pneumoniae NOT DETECTED NOT DETECTED   Streptococcus pyogenes NOT DETECTED NOT DETECTED   A.calcoaceticus-baumannii NOT DETECTED NOT DETECTED   Bacteroides fragilis NOT DETECTED NOT DETECTED   Enterobacterales NOT DETECTED NOT DETECTED   Enterobacter cloacae complex NOT DETECTED NOT DETECTED   Escherichia coli NOT DETECTED NOT DETECTED   Klebsiella aerogenes NOT DETECTED NOT DETECTED   Klebsiella oxytoca NOT DETECTED NOT DETECTED   Klebsiella pneumoniae NOT DETECTED NOT DETECTED   Proteus species NOT DETECTED NOT DETECTED   Salmonella species NOT DETECTED NOT DETECTED   Serratia marcescens NOT DETECTED NOT DETECTED   Haemophilus influenzae NOT DETECTED NOT DETECTED   Neisseria meningitidis NOT DETECTED NOT DETECTED   Pseudomonas aeruginosa NOT DETECTED NOT DETECTED   Stenotrophomonas  maltophilia NOT DETECTED NOT DETECTED   Candida albicans NOT DETECTED NOT DETECTED   Candida auris NOT DETECTED NOT DETECTED   Candida glabrata NOT DETECTED NOT DETECTED   Candida krusei NOT DETECTED NOT DETECTED   Candida parapsilosis NOT DETECTED NOT DETECTED   Candida tropicalis NOT DETECTED NOT DETECTED   Cryptococcus neoformans/gattii NOT DETECTED NOT DETECTED   Methicillin resistance mecA/C DETECTED (A) NOT DETECTED    Gretta Arab PharmD, BCPS Clinical Pharmacist [facility] main pharmacy [phone] 04/12/2020 1:48 PM

## 2020-04-12 NOTE — Progress Notes (Signed)
Phos 1.7 Replaced per protocol

## 2020-04-12 NOTE — Progress Notes (Addendum)
Progress Note  Patient Name: Calvin Cruz Date of Encounter: 04/12/2020  Primary Cardiologist: Glenetta Hew, MD  Subjective   Remains critically ill on the ventilator.Wife at bedside today.  Inpatient Medications    Scheduled Meds: . aspirin  325 mg Per Tube Daily  . chlordiazePOXIDE  25 mg Per Tube QID  . chlorhexidine gluconate (MEDLINE KIT)  15 mL Mouth Rinse BID  . Chlorhexidine Gluconate Cloth  6 each Topical Daily  . cholecalciferol  5,000 Units Per Tube Daily  . clonazePAM  2 mg Per Tube QID  . diclofenac Sodium  4 g Topical QID  . docusate  100 mg Per Tube BID  . ezetimibe  10 mg Per Tube Daily  . feeding supplement (PROSource TF)  45 mL Per Tube TID  . free water  100 mL Per Tube Q4H  . hydrocortisone sod succinate (SOLU-CORTEF) inj  50 mg Intravenous Q6H  . insulin aspart  0-20 Units Subcutaneous Q4H  . insulin aspart  2 Units Subcutaneous Q4H  . insulin glargine  12 Units Subcutaneous BID  . ipratropium-albuterol  3 mL Nebulization TID  . mouth rinse  15 mL Mouth Rinse 10 times per day  . metoCLOPramide (REGLAN) injection  5 mg Intravenous Q6H  . midodrine  10 mg Per Tube TID WC  . pantoprazole sodium  40 mg Per Tube BID  . polyethylene glycol  17 g Per Tube Daily  . QUEtiapine  100 mg Per Tube BID  . rosuvastatin  10 mg Per Tube Daily  . sennosides  5 mL Per Tube Daily  . sodium chloride flush  10-40 mL Intracatheter Q12H  . valproic acid  250 mg Per Tube QID   Continuous Infusions: . sodium chloride    . amiodarone 30 mg/hr (04/12/20 1300)  . feeding supplement (PIVOT 1.5 CAL) 60 mL/hr at 04/12/20 0604  . fentaNYL infusion INTRAVENOUS 50 mcg/hr (04/12/20 1300)  . heparin 2,000 Units/hr (04/12/20 1300)  . meropenem (MERREM) IV Stopped (04/12/20 1233)  . phenylephrine (NEO-SYNEPHRINE) Adult infusion 360 mcg/min (04/12/20 1300)  . propofol (DIPRIVAN) infusion 15 mcg/kg/min (04/12/20 1300)  . vancomycin 1,750 mg (04/12/20 1421)  . vasopressin 0.03  Units/min (04/12/20 1300)   PRN Meds: Place/Maintain arterial line **AND** sodium chloride, acetaminophen (TYLENOL) oral liquid 160 mg/5 mL, fentaNYL   Vital Signs    Vitals:   04/12/20 1135 04/12/20 1150 04/12/20 1200 04/12/20 1356  BP:      Pulse:   (!) 108   Resp:   (!) 30   Temp:  98 F (36.7 C)    TempSrc:  Axillary    SpO2: 90%  91% 94%  Weight:      Height:        Intake/Output Summary (Last 24 hours) at 04/12/2020 1436 Last data filed at 04/12/2020 1300 Gross per 24 hour  Intake 5296.01 ml  Output 1400 ml  Net 3896.01 ml   Last 3 Weights 04/11/2020 04/10/2020 04/09/2020  Weight (lbs) 203 lb 7.8 oz 193 lb 2 oz 203 lb 11.3 oz  Weight (kg) 92.3 kg 87.6 kg 92.4 kg     Telemetry    Atrial fib/flutter 100s-1teens- Personally Reviewed  Physical Exam   GEN: Critically ill M in no acute extremis on vent HEENT: Normocephalic, atraumatic Neck: No JVD Cardiac: Irregularly irregular, tachycardic, no murmurs, rubs, or gallops.  Respiratory:Coarse bilaterally on vent. Breathing is unlabored. GI: Soft, nontender, non-distended, BS +x 4. MS: no deformity. Extremities: No clubbing or cyanosis. Trace  BLE pedal edema. Distal pedal pulses are 2+ and equal bilaterally. Neuro:  Sedated Psych:  Unable to assess  Labs    High Sensitivity Troponin:   Recent Labs  Lab 04/06/20 1230 04/06/20 1645 04/06/20 1751 04/06/20 2307  TROPONINIHS '14 15 16 15      ' Cardiac EnzymesNo results for input(s): TROPONINI in the last 168 hours. No results for input(s): TROPIPOC in the last 168 hours.   Chemistry Recent Labs  Lab 04/07/20 0030 04/07/20 6270 04/08/20 0438 04/09/20 0524 04/09/20 1633 04/11/20 0145 04/11/20 1915 04/12/20 0447  NA 140   < > 142 141   < > 138 143 144  K 3.7   < > 3.4* 3.5   < > 3.4* 4.1 3.7  CL 101   < > 94* 94*   < > 87* 92* 94*  CO2 30   < > 36* 36*   < > 38* 43* 43*  GLUCOSE 96   < > 151* 221*   < > 224* 238* 137*  BUN 11   < > 15 23   < > 28* 31* 30*   CREATININE 0.32*   < > 0.39* <0.30*   < > 0.56* 0.43* 0.31*  CALCIUM 8.0*   < > 7.9* 7.9*   < > 7.6* 8.0* 8.1*  PROT 5.0*  --  5.7* 5.7*  --   --   --   --   ALBUMIN 2.0*  --  2.0* 2.0*  --   --   --   --   AST 25  --  26 25  --   --   --   --   ALT 25  --  28 33  --   --   --   --   ALKPHOS 40  --  54 54  --   --   --   --   BILITOT 0.6  --  0.7 0.7  --   --   --   --   GFRNONAA >60   < > >60 NOT CALCULATED   < > >60 >60 >60  ANIONGAP 9   < > 12 11   < > '13 8 7   ' < > = values in this interval not displayed.     Hematology Recent Labs  Lab 04/10/20 0305 04/11/20 0446 04/12/20 0447  WBC 7.5 19.6* 16.5*  RBC 3.54* 3.73* 3.23*  HGB 11.0* 11.7* 10.2*  HCT 33.6* 36.2* 32.6*  MCV 94.9 97.1 100.9*  MCH 31.1 31.4 31.6  MCHC 32.7 32.3 31.3  RDW 14.3 14.6 14.9  PLT 173 269 249    BNPNo results for input(s): BNP, PROBNP in the last 168 hours.   DDimer No results for input(s): DDIMER in the last 168 hours.   Radiology    DG CHEST PORT 1 VIEW  Result Date: 04/11/2020 CLINICAL DATA:  Hypoxia.  Ventilator support. EXAM: PORTABLE CHEST 1 VIEW COMPARISON:  04/09/2020 FINDINGS: Endotracheal tube tip is 3 cm above the carina. Orogastric or nasogastric tube enters the abdomen. Widespread pulmonary infiltrates persist, with increasing consolidation in the right lower lung. Right arm PICC tip in the SVC above the right atrium. IMPRESSION: Lines and tubes well positioned. Persistent widespread pulmonary infiltrates with increasing consolidation in the right lower lung. Electronically Signed   By: Nelson Chimes M.D.   On: 04/11/2020 01:04    Cardiac Studies   Limited echo 04/06/20  1. Left ventricular ejection fraction, by estimation, is 50 to 55%.  The  left ventricle has low normal function. Left ventricular endocardial  border not optimally defined to evaluate regional wall motion. Left  ventricular diastolic parameters were  normal.  2. Right ventricular systolic function is normal.  The right ventricular  size is normal. Tricuspid regurgitation signal is inadequate for assessing  PA pressure.  3. The mitral valve is normal in structure. No evidence of mitral valve  regurgitation.  4. The aortic valve is tricuspid. Aortic valve regurgitation is trivial.  Mild aortic valve stenosis.  5. The inferior vena cava is normal in size with greater than 50%  respiratory variability, suggesting right atrial pressure of 3 mmHg.   Complete echo 03/24/20   1. Left ventricular ejection fraction, by estimation, is 55 to 60%. The  left ventricle has normal function. The left ventricle has no regional  wall motion abnormalities. There is moderate left ventricular hypertrophy.  Left ventricular diastolic  parameters are indeterminate.  2. Right ventricular systolic function is low normal. The right  ventricular size is normal. Tricuspid regurgitation signal is inadequate  for assessing PA pressure.  3. The mitral valve is grossly normal. Trivial mitral valve  regurgitation.  4. The aortic valve is tricuspid. There is mild calcification of the  aortic valve. Aortic valve regurgitation is not visualized.  5. The inferior vena cava is normal in size with greater than 50%  respiratory variability, suggesting right atrial pressure of 3 mmHg.   Patient Profile     60 y.o. male with a hx of CAD (NSTEMI with LCx PCI 2016), ulcerative colitis s/p ilioanal anastamosis, DM, HTN, gout, HLD, hypertriglyceridemia. He was admitted 02/23/2020 with Covid PNA progressing to ARDS/VDRF, also partial SBO. Hospital admission also notable for pneumomediastinum, development of fever with HCAP and septic shock requiring pressor support, acute metabolic encephaloapthy, abdominal distention c/w recurrent ileus, and urinary retention requiring foley catheter placement. Cardiology following for atrial fib/flutter.  Assessment & Plan    1. Acute hypoxic respiratory with ARDS from [id number] PNA,  complicated by development of HCAP with ongoing hypotension - requiring critical care life support measures that are ongoing - currently on midodrine and pressor support - re-escalation of abx planned for worsening PNA, also bacteremia - PCCM is concerned for poor prognosis  2. Acute metabolic encephalopathy - per primary team  3. Paroxysmal atrial fib/flutter - undoubtably driven by underlying metabolic stressors - BP limits aggressive medication titration, still requiring pressors + midodrine - went into afib initially 2/16 s/p DCCV x 3 that day, reported no benefit at that time - nurse note reports went back into sinus tach after 3rd attempt. Was started on IV amiodarone/Lopressor/heparin at that time - does appear he has been intermittently sinus since that time - went into atrial flutter 2:1 overnight 2/21, treated with initiation of IV digoxin load yesterday - now flucutuating between atrial fib/flutter with rates 100s-1teens which actually seems relatively appropriate for how ill he is. Start maintenance digoxin dose 0.140m IV - continue IV amiodarone & heparin per pharmacy - continue to follow [place] 4. H/o CAD - on ASA - maintenance Brilinta on hold for now, may consider holding altogether going forward as he may require eventual transition to [place] clinical course - receiving Zetia/Crestor per tube - no longer on BB due to hypotension - initial hsTroponins were negative on admission - EKG did show diffuse ST depressions in the context of rapid rates, but EKG was benign when repeated in NSR - EF low-normal, mild AS by last  echo   For questions or updates, please contact [place] Please consult [email or web address] for contact info under Cardiology/STEMI.  Signed, Charlie Pitter, PA-C 04/12/2020, 2:36 PM    Patient seen and examined.  Agree with above documentation.  On exam, patient is intuabted and sedated, irregular rhythm, tachycardic, no murmurs, course breath  sounds, no LE edema.  Remains in A. fib with RVR, rates appear improved, currently 110s.  Continue IV amiodarone.  Was loaded with digoxin yesterday, will start maintenance dose with 0.125 mg daily.  Will check a level tomorrow to evaluate for toxicity.  Donato Heinz, MD

## 2020-04-12 NOTE — Progress Notes (Addendum)
Pharmacy Antibiotic Note  Calvin Cruz is a 60 y.o. male admitted on 03/12/2020 with partial small bowel obstruction and COVID 19 infection.  Transferred to ICU on 2/5.   Patient initially completed 7 days of Unasyn and more recently completed 7 days of Cefepime for HAP, but now has re-developed a fever, leukocytosis, and hypotension.  Pharmacy has been consulted for Meropenem dosing for pneumonia, Vancomycin for bacteremia.  Plan: Meropenem 1g IV q8h Vancomycin 1750 mg IV q12h (SCr rounded 0.8, Vd 0.72. Est AUC 485) Follow up renal function, culture results, and clinical course.   Height: 6\' 6"  (198.1 cm) Weight: 92.3 kg (203 lb 7.8 oz) IBW/kg (Calculated) : 91.4  Temp (24hrs), Avg:101.1 F (38.4 C), Min:99.1 F (37.3 C), Max:103 F (39.4 C)  Recent Labs  Lab 04/08/20 0438 04/08/20 1405 04/09/20 0524 04/09/20 1633 04/10/20 0305 04/10/20 0428 04/10/20 1132 04/10/20 1622 04/11/20 0145 04/11/20 0446 04/11/20 1915 04/12/20 0447  WBC 6.1  --  6.3  --  7.5  --   --   --   --  19.6*  --  16.5*  CREATININE 0.39*  --  <0.30*   < >  --    < > 0.42* 0.52* 0.56*  --  0.43* 0.31*  LATICACIDVEN  --  2.2*  --   --   --   --   --   --   --   --   --   --    < > = values in this interval not displayed.    Estimated Creatinine Clearance: 125.4 mL/min (A) (by C-G formula based on SCr of 0.31 mg/dL (L)).    Allergies  Allergen Reactions  . Metronidazole Itching    Unknown  . Ciprofloxacin Itching and Nausea And Vomiting  . Dexilant [Dexlansoprazole] Diarrhea  . Morphine And Related Itching  . Doxycycline Itching, Other (See Comments) and Rash    Redness of skin, burning sensation Unknown     Antimicrobials this admission:  1/20 Remdesivir >> 1/24 1/27 Baricitinib >> 1/30  2/3>>2/6- DC'd out of window 2/5 Ceftriaxone >> x 1 2/5 Unasyn >> 2/12 2/14 cefepime >> 220 2/14 vancomycin >> 2/16 2/22 Meropenem >>   Microbiology results:  2/2 Urine CX: E. Coli >100K CFU/ml. Pan  sens F 2/5 MRSA - 1/15 covid + 2/14 TA: moderate respiratory flora, no Staph or PsA seen 2/15 UCx: NGF 2/21 UCx: NGF 2/21 Respiratory: moderate Ecoli 2/21 BCx: 1/4 Bottles with GPC clusters 2/22 BCID:  Methicillin resistant staph epi  Thank you for allowing pharmacy to be a part of this patient's care. Gretta Arab PharmD, BCPS Clinical Pharmacist [facility] main pharmacy [phone] 04/12/2020 8:17 AM

## 2020-04-12 NOTE — Progress Notes (Addendum)
NAME:  Calvin Cruz, MRN:  [id number], DOB:  Feb 14, 1961, LOS: 40 ADMISSION DATE:  03/20/2020, CONSULTATION DATE:  03/21/2020 REFERRING MD:  Dr. Manuella Ghazi, [facility], CHIEF COMPLAINT:  Short of breath   Brief History:  60 year old never vaccinated for covid with ARDS secondary to COVID-19 pneumonia, small bowel obstruction/ileus Intubated on 2/5  and transferred to [facility] long hospital  Past Medical History:  Ulcerative colitis s/p colectomy, Gout, CAD s/p DES, HTN, HLD, Nephrolithiasis, DM, Erythrocytosis  Significant Hospital Events:   1/15 Admit to [facility] 1/18 Endoscopic insertion of NG tube 1/20 start remdesivir, steroids 1/24 needing high flow oxygen and NRB 2/01 start TPN 2/5 Intubated and tx to [facility] 2/6 Stop TPN, start tube feeds he has an OG tube 2/8 Weaning benzos, started Precedex 2/10 pneumo-mediastinum, dropped tidal volume 540 cc2/10 pneumomediastinum/subcutaneous emphysema due to barotrauma 2/13 dysynchronous with vent over night/ placed on propofol by elink around  4am but not breathing over vent on am rounds / still on low doses of levophed. 2/13 so increased clonazepam per FT to 2 mg qid as well trial of seroquel 100 mg bid starting 2/13 am  2/14 changed BIS goal to -2 stopped precedex. Added librium and valproic acid. Solumedrol changed to pred. Spiked fever over the day. Got more tachycardic, O2 requirement increased. Sputum culture sent. vanc and cefepime started empirically for HCAP.  2/15 peep requirements up. Recurrent septic shock 2/2 HCAP. On neo as norepi exacerbated tachycardia further. abd more distended c/w recurrent ileus. Had urinary retention foley replaced. Started on urecholine.  2/16 better,PEEP/FIO2 needs down, tachycardia resolved. Ileus better changing FT to gravity from LIWS. Stopping vanc. Starting to wean fent first. New onset Fib w/ RVR and rate in 200s w/ associated hypotension. Attempted cardioversion X 3, each time would  Convert then go back into AF w/  RVR. Loaded w/ amio X 2, gtt started and started on lopressor as well as heparin gtt. CEs negative 2/17 Sinus tach, hypertensive requiring PRN treatment. Midodrine stopped. abd still soft. Starting trickle feeds. ECHO ejection fraction, by estimation, is 50 to 55%. The left ventricle has low normal function. Left ventricular endocardial border not optimally defined to evaluate regional wall motion. Left ventricular diastolic parameters were normal. Lopressor increased. Aline placed given concern about accuracy IV lasix administered. Had to stop lantus due to hypoglycemia. Seen by palliative. Goals of care discussed. Still wanted aggressive care 2/18 pushing diuresis. Remains in NSR. Requiring intermittent Neo but felt drug related. Lopressor dosing decreased. Asked nursing to preferentially wean fent and changed RASS goal to -2 to -3 2/19 Off pressors this morning. However intermittently has hypotension requiring vasopressors. 2/21 AF RVR ongoing with rates in 150s, Cards consult > Digoxin load.    Consults:  Gastroenterology s/o 1/28 Surgery s/o 1/19  Procedures:  Rt PICC 2/01 >>   Significant Diagnostic Tests:   CT abd/pelvis 1/16 >> lower lungs clear, mildly dilated air and fluid-filled loops of ileum measuring up to 3.2 cm. No clear transition point or focal wall narrowing  CT angio chest 1/28 >> diffuse b/l airspace opacity consistent with COVID 19 pneumonia  CT chest WO 2/10 >> diffuse pneumo mediastinum and subcutaneous emphysema, extensive bilateral groundglass disease  ECHO 2/16: ejection fraction, by estimation, is 50 to 55%. The left ventricle has low normal function. Left ventricular endocardial border not optimally defined to evaluate regional wall motion. Left ventricular diastolic parameters were normal. Micro Data:  COVID PCR 1/15 >> Positive Flu PCR 1/15 >> negative Urine  culture 2/2-pansensitive E. Coli Blood 2/21 > Urine 2/21 > Trach aspirate 2/21  >  Antimicrobials:  Remdesivir 1/20 >> 1/24 Steroids 1/20 >>  Baricitinib 1/27 >> 2/6 Unasyn 2/5 >>  2/12 Cefepime 2/14>>> vanc 2/14>>>2/16  Interim History / Subjective:  Atrial flutter ongoing, but rates improved. Still fast. Overnight period uneventful.   Objective   Blood pressure 139/65, pulse (!) 144, temperature (!) 100.5 F (38.1 C), temperature source Oral, resp. rate (!) 30, height 6\' 6"  (1.981 m), weight 92.3 kg, SpO2 (!) 89 %.    Vent Mode: PRVC FiO2 (%):  [85 %-100 %] 90 % Set Rate:  [28 bmp] 28 bmp Vt Set:  [450 mL] 450 mL PEEP:  [12 cmH20] 12 cmH20 Plateau Pressure:  [23 cmH20-32 cmH20] 31 cmH20   Intake/Output Summary (Last 24 hours) at 04/12/2020 0711 Last data filed at 04/12/2020 0947 Gross per 24 hour  Intake 3906.9 ml  Output 1400 ml  Net 2506.9 ml   Filed Weights   04/09/20 0500 04/10/20 0347 04/11/20 0500  Weight: 92.4 kg 87.6 kg 92.3 kg      Physical Exam:  General: acutely ill appearing middle aged male.  HEENT: [place]/AT, PERRL, no JVD.  Respiratory: Clear bilateral breath sounds  Cardiovascular:Tachy, IRIR, rates 110s through 120s.  GI: Soft, ND. Normoactive Extremities: Trace edema in all four extremities.   Neuro: Sedated RASS -3.  GU: Foley   Resolved Hospital Problem list   Presented also with gastroenteritis and increased stool volumes on presentation-->resolved SBO/ileus-->resolved  E coli UTI-->treated and resolved. (completed Rx 2/12) Hyponatremia  Septic shock 2/2 HCAP -->resolved and off pressors as of 2/17. Completed 7 days of Cefepime on 2/20 Assessment & Plan:   Acute Hypoxic Respiratory failure w/ ARDS from COVID 19 pneumonia; complicated by HCAP 0/96 (NOS) Remains on high O2 requirement on ventilator. Pplat 30, Driving pressure 18 Ongoing cuff leak, no leak this morning.  Plan: --ARDSnet protocol: 5cc/Kg, PEEP 12, f28, 100% --PAD protocol with Propofol and fentanyl for RASS goal -2 to -3.  --Give one dose lasix today and  gauge response.  --Continue pred 20mg  today. Drop to 10mg  for 2/23   Drug induced Hypotension/ was Hypertensive 2/17.  Plan: --DC Urochol 2/19. Can restart when foley cath removed --Phenylephrine for SBP goal 90 mmHg. Avoid NE due to tachyarrhythmia   Acute metabolic encephalopathy secondary to delirium Agitation on mechanical ventilation Plan: --PAD protocol as above --Librium 25 mg QID --Klonopin 2mg  QID --Seroquel 100 mg BID --Valproic acid 250 mg QID  Atrial flutter w/ RVR: Difficult to manage. BB limited due to hypotension, not responding well to amiodarone. Electrical cardioversion x 3 on 2/16 with limited benefit Plan: --Telemetry monitoring --Cardiology following --Continue heparin per pharmacy --Amiodarone infusion --Digoxin --Metoprolol DC due to ongoing hypotension --Keep Mg >2 and K > 4.  H/o CAD (DES 2016), HLD, HFrEF (EF slightly reduced to 50%)  Plan: --ASA --Continue crestor and zetia --Brilinta to be added back eventually (had been on maintenance dosing) holding due to pending trach   Fluid and electrolyte imbalance: hypokalemia, hypophosphatemia  Plan: -- Trend BMP and correct as indicated.   Sepsis: leukocytosis with TMax 103. Source uncertain. 2/21 CXR with question of consolidation in R lung base, but not obvious. Finished 7 days cefepime 2/20. -- pan-culture 2/21 -- Will need to start empiric antibiotics again. Meropenem  History of ulcerative colitis. W/ recurrent SBO (2/2 h/o adhesions) -abd more distended on 2/15; stopped tubefeeds and placed FT to LIWS. His abd  is soft again and is tolerating tube feeds again. 2/18 KUB with improving distension Plan --Continue tube feeds. Increase rate slowly (50 ml/hr) --On senna  DM type 2 poorly controlled with steroid induced hyperglycemia. - glucose was low while holding tube feeds. Now control is poor again Plan --CBG/SSI --Lantus 12 BID, continue today --DM coordinator consult pending.     Mild  anemia of critical illness hgb stable Plan --Trend  --Transfuse for hgb < 7  Best practice (evaluated daily)  Diet: TF DVT prophylaxis: heparin infusion for AF GI prophylaxis: protonix Mobility: Bedrest Disposition: ICU Family:  Will call wife today.  Code Status: full  Lines/tubes->has PICC and foley   Critical care time Soledad, AGACNP-BC [facility]  See Amion for personal pager PCCM on call pager [phone] until 7pm. Please call Elink 7p-7a. [phone]  04/12/2020 7:11 AM

## 2020-04-12 NOTE — Progress Notes (Signed)
PCCM INTERVAL PROGRESS NOTE   I have had a good discussion with [name] at bedside this afternoon. She understands that he is critically ill and my medical opinion that he will not survive the hospitalization. In light of recent worsening in his clinical status I have recommended we pursue a DNR order. We discussed this at length and she is not prepared to make any changes to code status at this time.    Georgann Housekeeper, AGACNP-BC [facility]  See Amion for personal pager PCCM on call pager [phone] until 7pm. Please call Elink 7p-7a. [phone]  04/12/2020 3:06 PM

## 2020-04-12 NOTE — Progress Notes (Signed)
[facility] Progress Note Patient Name: Calvin Cruz DOB: 1960/03/21 MRN: [id number]   Date of Service  04/12/2020  HPI/Events of Note  Notified of atrial fibrillation in RVR with rates 100s-130s as per RN.   On my camera assessment, HR 100s-120s.  Pt is on neosynephrine and vasopressin, amiodarone gtt and digoxin.   eICU Interventions  Continue to monitor for now.  If persistently >120s, will need to cardioversion.     Intervention Category Intermediate Interventions: Arrhythmia - evaluation and management  Elsie Lincoln 04/12/2020, 11:40 PM

## 2020-04-13 ENCOUNTER — Inpatient Hospital Stay (HOSPITAL_COMMUNITY): Payer: Commercial Managed Care - PPO

## 2020-04-13 DIAGNOSIS — Z9861 Coronary angioplasty status: Secondary | ICD-10-CM | POA: Diagnosis not present

## 2020-04-13 DIAGNOSIS — I4891 Unspecified atrial fibrillation: Secondary | ICD-10-CM | POA: Diagnosis not present

## 2020-04-13 DIAGNOSIS — J9601 Acute respiratory failure with hypoxia: Secondary | ICD-10-CM | POA: Diagnosis not present

## 2020-04-13 DIAGNOSIS — K56600 Partial intestinal obstruction, unspecified as to cause: Secondary | ICD-10-CM | POA: Diagnosis not present

## 2020-04-13 DIAGNOSIS — R7881 Bacteremia: Secondary | ICD-10-CM | POA: Diagnosis not present

## 2020-04-13 DIAGNOSIS — I2510 Atherosclerotic heart disease of native coronary artery without angina pectoris: Secondary | ICD-10-CM | POA: Diagnosis not present

## 2020-04-13 LAB — BASIC METABOLIC PANEL
Anion gap: 9 (ref 5–15)
Anion gap: 10 (ref 5–15)
BUN: 42 mg/dL — ABNORMAL HIGH (ref 8–23)
BUN: 41 mg/dL — ABNORMAL HIGH (ref 8–23)
CO2: 40 mmol/L — ABNORMAL HIGH (ref 22–32)
CO2: 38 mmol/L — ABNORMAL HIGH (ref 22–32)
Calcium: 7.7 mg/dL — ABNORMAL LOW (ref 8.9–10.3)
Calcium: 7.4 mg/dL — ABNORMAL LOW (ref 8.9–10.3)
Chloride: 92 mmol/L — ABNORMAL LOW (ref 98–111)
Chloride: 94 mmol/L — ABNORMAL LOW (ref 98–111)
Creatinine, Ser: 0.54 mg/dL — ABNORMAL LOW (ref 0.61–1.24)
Creatinine, Ser: 0.52 mg/dL — ABNORMAL LOW (ref 0.61–1.24)
GFR, Estimated: >60 mL/min (ref >60)
GFR, Estimated: >60 mL/min (ref >60)
Glucose, Bld: 310 mg/dL — ABNORMAL HIGH (ref 70–99)
Glucose, Bld: 266 mg/dL — ABNORMAL HIGH (ref 70–99)
Potassium: 4.1 mmol/L (ref 3.5–5.1)
Potassium: 3.8 mmol/L (ref 3.5–5.1)
Sodium: 141 mmol/L (ref 135–145)
Sodium: 142 mmol/L (ref 135–145)

## 2020-04-13 LAB — CBC
HCT: 28.7 % — ABNORMAL LOW (ref 39.0–52.0)
Hemoglobin: 9.1 g/dL — ABNORMAL LOW (ref 13.0–17.0)
MCH: 31.9 pg (ref 26.0–34.0)
MCHC: 31.7 g/dL (ref 30.0–36.0)
MCV: 100.7 fL — ABNORMAL HIGH (ref 80.0–100.0)
Platelets: 275 10*3/uL (ref 150–400)
RBC: 2.85 MIL/uL — ABNORMAL LOW (ref 4.22–5.81)
RDW: 15.2 % (ref 11.5–15.5)
WBC: 19.4 10*3/uL — ABNORMAL HIGH (ref 4.0–10.5)
nRBC: 0.4 % — ABNORMAL HIGH (ref 0.0–0.2)

## 2020-04-13 LAB — ECHOCARDIOGRAM LIMITED
Height: 78 in
S' Lateral: 2.5 cm
Weight: 3379.21 oz

## 2020-04-13 LAB — MAGNESIUM
Magnesium: 2.3 mg/dL (ref 1.7–2.4)
Magnesium: 2.6 mg/dL — ABNORMAL HIGH (ref 1.7–2.4)

## 2020-04-13 LAB — GLUCOSE, CAPILLARY
Glucose-Capillary: 285 mg/dL — ABNORMAL HIGH (ref 70–99)
Glucose-Capillary: 251 mg/dL — ABNORMAL HIGH (ref 70–99)
Glucose-Capillary: 218 mg/dL — ABNORMAL HIGH (ref 70–99)
Glucose-Capillary: 246 mg/dL — ABNORMAL HIGH (ref 70–99)
Glucose-Capillary: 242 mg/dL — ABNORMAL HIGH (ref 70–99)

## 2020-04-13 LAB — PHOSPHORUS: Phosphorus: 2.4 mg/dL — ABNORMAL LOW (ref 2.5–4.6)

## 2020-04-13 LAB — CULTURE, RESPIRATORY W GRAM STAIN

## 2020-04-13 LAB — TRIGLYCERIDES: Triglycerides: 142 mg/dL (ref <150)

## 2020-04-13 LAB — HEPARIN LEVEL (UNFRACTIONATED)
Heparin Unfractionated: 1.02 IU/mL — ABNORMAL HIGH (ref 0.30–0.70)
Heparin Unfractionated: 0.7 IU/mL (ref 0.30–0.70)
Heparin Unfractionated: 0.77 IU/mL — ABNORMAL HIGH (ref 0.30–0.70)

## 2020-04-13 LAB — DIGOXIN LEVEL: Digoxin Level: 0.7 ng/mL — ABNORMAL LOW (ref 0.8–2.0)

## 2020-04-13 MED ORDER — DIGOXIN 0.25 MG/ML IJ SOLN
INTRAMUSCULAR | Status: AC
  Administered 2020-04-14: 250 ug
  Filled 2020-04-13: qty 2

## 2020-04-13 MED ORDER — INSULIN GLARGINE 100 UNIT/ML ~~LOC~~ SOLN
15.0000 [IU] | Freq: Two times a day (BID) | SUBCUTANEOUS | Status: DC
  Administered 2020-04-13 – 2020-04-14 (×3): 15 [IU] via SUBCUTANEOUS
  Filled 2020-04-13 (×3): qty 0.15

## 2020-04-13 MED ORDER — DIGOXIN 0.25 MG/ML IJ SOLN
0.2500 mg | Freq: Once | INTRAMUSCULAR | Status: AC
  Administered 2020-04-14: 0.25 mg via INTRAVENOUS

## 2020-04-13 MED ORDER — POTASSIUM PHOSPHATES 15 MMOLE/5ML IV SOLN
30.0000 mmol | Freq: Once | INTRAVENOUS | Status: AC
  Administered 2020-04-13: 30 mmol via INTRAVENOUS
  Filled 2020-04-13: qty 10

## 2020-04-13 MED ORDER — CEFAZOLIN SODIUM-DEXTROSE 2-4 GM/100ML-% IV SOLN
2.0000 g | Freq: Three times a day (TID) | INTRAVENOUS | Status: DC
  Administered 2020-04-13 – 2020-04-14 (×3): 2 g via INTRAVENOUS
  Filled 2020-04-13 (×4): qty 100

## 2020-04-13 MED ORDER — HEPARIN (PORCINE) 25000 UT/250ML-% IV SOLN
1800.0000 [IU]/h | INTRAVENOUS | Status: DC
  Administered 2020-04-13 (×2): 1800 [IU]/h via INTRAVENOUS
  Filled 2020-04-13: qty 250

## 2020-04-13 MED ORDER — POTASSIUM CHLORIDE 10 MEQ/50ML IV SOLN
10.0000 meq | INTRAVENOUS | Status: AC
  Administered 2020-04-14 (×2): 10 meq via INTRAVENOUS
  Filled 2020-04-13 (×2): qty 50

## 2020-04-13 MED ORDER — DIGOXIN 0.25 MG/ML IJ SOLN
0.2500 mg | Freq: Once | INTRAMUSCULAR | Status: AC
  Administered 2020-04-13: 0.25 mg via INTRAVENOUS

## 2020-04-13 MED ORDER — DILTIAZEM HCL-DEXTROSE 125-5 MG/125ML-% IV SOLN (PREMIX)
5.0000 mg/h | INTRAVENOUS | Status: DC
  Administered 2020-04-14: 7.5 mg/h via INTRAVENOUS
  Administered 2020-04-14: 5 mg/h via INTRAVENOUS
  Filled 2020-04-13 (×2): qty 125

## 2020-04-13 MED ORDER — HEPARIN (PORCINE) 25000 UT/250ML-% IV SOLN
1600.0000 [IU]/h | INTRAVENOUS | Status: DC
  Administered 2020-04-13 – 2020-04-14 (×2): 1600 [IU]/h via INTRAVENOUS
  Filled 2020-04-13 (×2): qty 250

## 2020-04-13 MED ORDER — HEPARIN (PORCINE) 25000 UT/250ML-% IV SOLN: 1700.0000 [IU]/h | INTRAVENOUS | Status: DC

## 2020-04-13 MED ORDER — INSULIN ASPART 100 UNIT/ML ~~LOC~~ SOLN
4.0000 [IU] | SUBCUTANEOUS | Status: DC
  Administered 2020-04-13 – 2020-04-14 (×6): 4 [IU] via SUBCUTANEOUS

## 2020-04-13 MED ORDER — VANCOMYCIN HCL 1750 MG/350ML IV SOLN
1750.0000 mg | Freq: Two times a day (BID) | INTRAVENOUS | Status: DC
  Administered 2020-04-13 – 2020-04-14 (×3): 1750 mg via INTRAVENOUS
  Filled 2020-04-13 (×5): qty 350

## 2020-04-13 NOTE — Progress Notes (Signed)
  Echocardiogram 2D Echocardiogram has been performed.  Calvin Cruz 04/13/2020, 8:29 AM

## 2020-04-13 NOTE — Progress Notes (Addendum)
Progress Note  Patient Name: Calvin Cruz Date of Encounter: 04/13/2020  Primary Cardiologist: Glenetta Hew, MD  Subjective   Blood cx + MRSE. Remains critically ill. Intubated, sedated.  Inpatient Medications    Scheduled Meds: . aspirin  325 mg Per Tube Daily  . chlordiazePOXIDE  25 mg Per Tube QID  . chlorhexidine gluconate (MEDLINE KIT)  15 mL Mouth Rinse BID  . Chlorhexidine Gluconate Cloth  6 each Topical Daily  . cholecalciferol  5,000 Units Per Tube Daily  . clonazePAM  2 mg Per Tube QID  . diclofenac Sodium  4 g Topical QID  . digoxin  0.125 mg Intravenous Daily  . docusate  100 mg Per Tube BID  . ezetimibe  10 mg Per Tube Daily  . feeding supplement (PROSource TF)  45 mL Per Tube TID  . free water  100 mL Per Tube Q4H  . hydrocortisone sod succinate (SOLU-CORTEF) inj  50 mg Intravenous Q6H  . insulin aspart  0-20 Units Subcutaneous Q4H  . insulin aspart  2 Units Subcutaneous Q4H  . insulin glargine  12 Units Subcutaneous BID  . ipratropium-albuterol  3 mL Nebulization TID  . mouth rinse  15 mL Mouth Rinse 10 times per day  . metoCLOPramide (REGLAN) injection  5 mg Intravenous Q6H  . midodrine  10 mg Per Tube TID WC  . pantoprazole sodium  40 mg Per Tube BID  . polyethylene glycol  17 g Per Tube Daily  . QUEtiapine  100 mg Per Tube BID  . rosuvastatin  10 mg Per Tube Daily  . sennosides  5 mL Per Tube Daily  . sodium chloride flush  10-40 mL Intracatheter Q12H  . valproic acid  250 mg Per Tube QID   Continuous Infusions: . sodium chloride    . amiodarone 30 mg/hr (04/13/20 0700)  . feeding supplement (PIVOT 1.5 CAL) 1,000 mL (04/13/20 0019)  . fentaNYL infusion INTRAVENOUS 100 mcg/hr (04/13/20 0700)  . heparin 1,800 Units/hr (04/13/20 0700)  . meropenem (MERREM) IV Stopped (04/13/20 0513)  . phenylephrine (NEO-SYNEPHRINE) Adult infusion 380 mcg/min (04/13/20 0815)  . propofol (DIPRIVAN) infusion 15 mcg/kg/min (04/13/20 0700)  . vancomycin Stopped  (04/13/20 0336)  . vasopressin 0.03 Units/min (04/13/20 0700)   PRN Meds: Place/Maintain arterial line **AND** sodium chloride, acetaminophen (TYLENOL) oral liquid 160 mg/5 mL, fentaNYL   Vital Signs    Vitals:   04/13/20 0715 04/13/20 0730 04/13/20 0745 04/13/20 0816  BP:    (!) 148/57  Pulse: (!) 113 (!) 113 (!) 120 (!) 122  Resp: (!) 28 (!) 28 (!) 35 (!) 30  Temp:      TempSrc:      SpO2: 94% 94% 93% 93%  Weight:      Height:        Intake/Output Summary (Last 24 hours) at 04/13/2020 0827 Last data filed at 04/13/2020 0700 Gross per 24 hour  Intake 5432.18 ml  Output 2300 ml  Net 3132.18 ml   Last 3 Weights 04/13/2020 04/11/2020 04/10/2020  Weight (lbs) 211 lb 3.2 oz 203 lb 7.8 oz 193 lb 2 oz  Weight (kg) 95.8 kg 92.3 kg 87.6 kg     Telemetry    Currently in what looks like atrial flutter with variable response, 1teens  - Personally Reviewed  Physical Exam   GEN: Critically ill M in no acute extremis on vent HEENT: Normocephalic, atraumatic, sclera non-icteric. Neck: No JVD Cardiac: Irregularly irregular, rapid, no murmurs, rubs, or gallops.  Respiratory:  Coarse mechanical BS, no wheezing GI: Soft, nontender, non-distended, BS present MS: no deformity. Extremities: No clubbing or cyanosis. Trace edema in all 4 extremities, less so in ankles today. Extremities warm Neuro:  Sedated. Psych: Unable to assess due to sedation/intubation  Labs    High Sensitivity Troponin:   Recent Labs  Lab 04/06/20 1230 04/06/20 1645 04/06/20 1751 04/06/20 2307  TROPONINIHS '14 15 16 15      ' Cardiac EnzymesNo results for input(s): TROPONINI in the last 168 hours. No results for input(s): TROPIPOC in the last 168 hours.   Chemistry Recent Labs  Lab 04/07/20 0030 04/07/20 8115 04/08/20 0438 04/09/20 0524 04/09/20 1633 04/12/20 0447 04/12/20 1633 04/13/20 0428  NA 140   < > 142 141   < > 144 143 141  K 3.7   < > 3.4* 3.5   < > 3.7 4.0 4.1  CL 101   < > 94* 94*   < >  94* 93* 92*  CO2 30   < > 36* 36*   < > 43* 41* 40*  GLUCOSE 96   < > 151* 221*   < > 137* 301* 310*  BUN 11   < > 15 23   < > 30* 33* 42*  CREATININE 0.32*   < > 0.39* <0.30*   < > 0.31* 0.53* 0.54*  CALCIUM 8.0*   < > 7.9* 7.9*   < > 8.1* 7.3* 7.7*  PROT 5.0*  --  5.7* 5.7*  --   --   --   --   ALBUMIN 2.0*  --  2.0* 2.0*  --   --   --   --   AST 25  --  26 25  --   --   --   --   ALT 25  --  28 33  --   --   --   --   ALKPHOS 40  --  54 54  --   --   --   --   BILITOT 0.6  --  0.7 0.7  --   --   --   --   GFRNONAA >60   < > >60 NOT CALCULATED   < > >60 >60 >60  ANIONGAP 9   < > 12 11   < > '7 9 9   ' < > = values in this interval not displayed.     Hematology Recent Labs  Lab 04/11/20 0446 04/12/20 0447 04/13/20 0428  WBC 19.6* 16.5* 19.4*  RBC 3.73* 3.23* 2.85*  HGB 11.7* 10.2* 9.1*  HCT 36.2* 32.6* 28.7*  MCV 97.1 100.9* 100.7*  MCH 31.4 31.6 31.9  MCHC 32.3 31.3 31.7  RDW 14.6 14.9 15.2  PLT 269 249 275    BNPNo results for input(s): BNP, PROBNP in the last 168 hours.   DDimer No results for input(s): DDIMER in the last 168 hours.   Radiology    DG Chest Port 1 View  Result Date: 04/13/2020 CLINICAL DATA:  ARDS. EXAM: PORTABLE CHEST 1 VIEW COMPARISON:  04/13/2019. FINDINGS: Endotracheal tube, NG tube, right PICC line in stable position. Heart size normal. Diffuse bilateral pulmonary infiltrates/edema again noted without interim change. Small right pleural effusion. IMPRESSION: 1. Lines and tubes in stable position. 2. Diffuse bilateral pulmonary infiltrates/edema again noted without interim change. Small right pleural effusion. Electronically Signed   By: Marcello Moores  Register   On: 04/13/2020 05:34    Cardiac Studies   Limited echo 04/06/20  1.  Left ventricular ejection fraction, by estimation, is 50 to 55%. The  left ventricle has low normal function. Left ventricular endocardial  border not optimally defined to evaluate regional wall motion. Left  ventricular  diastolic parameters were  normal.  2. Right ventricular systolic function is normal. The right ventricular  size is normal. Tricuspid regurgitation signal is inadequate for assessing  PA pressure.  3. The mitral valve is normal in structure. No evidence of mitral valve  regurgitation.  4. The aortic valve is tricuspid. Aortic valve regurgitation is trivial.  Mild aortic valve stenosis.  5. The inferior vena cava is normal in size with greater than 50%  respiratory variability, suggesting right atrial pressure of 3 mmHg.   Complete echo 03/24/20   1. Left ventricular ejection fraction, by estimation, is 55 to 60%. The  left ventricle has normal function. The left ventricle has no regional  wall motion abnormalities. There is moderate left ventricular hypertrophy.  Left ventricular diastolic  parameters are indeterminate.  2. Right ventricular systolic function is low normal. The right  ventricular size is normal. Tricuspid regurgitation signal is inadequate  for assessing PA pressure.  3. The mitral valve is grossly normal. Trivial mitral valve  regurgitation.  4. The aortic valve is tricuspid. There is mild calcification of the  aortic valve. Aortic valve regurgitation is not visualized.  5. The inferior vena cava is normal in size with greater than 50%   Patient Profile     60 y.o. male with a hx of CAD (NSTEMI with LCx PCI 2016), ulcerative colitis s/p ilioanal anastamosis, DM, HTN, gout, HLD,hypertriglyceridemia. He was admitted 03/14/2020 with Covid PNA progressing to ARDS/VDRF, also partial SBO. Hospital admission also notable for pneumomediastinum, development of fever with HCAP and septic shock requiring pressor support, acute metabolic encephaloapthy, abdominal distention c/w recurrent ileus, urinary retention requiring foley catheter placement, and most recently recurrent fevers and MRSE bacteremia.  Cardiology following for atrial fib/flutter. Went into afib  initially 2/16 s/p DCCV x 3 that day - nurse note reports went back into sinus tach after 3rd attempt. Was started on IV amiodarone/Lopressor/heparin at that time but Lopressor had to be stopped due to hypotension requiring ongoing pressor support. Cardiology signed on 2/21 due to atrial flutter 2:1 conduction as well as rapid atrial fib, prompting digoxin initiation. Recurrent arrhythmias felt likely triggered by additional infection.  Assessment & Plan    1. Acute hypoxic respiratory with ARDS from UEAVW-09 PNA, complicated by development of HCAP with ongoing hypotension and now MRSE bacterema - requiring critical care life support measures that are ongoing - currently on midodrine as well as pressor support, critical care team managing - has required re-escalation of abx  - limited echo pending for bacteremia - notes outline PCCM concerned for poor prognosis  2. Acute metabolic encephalopathy - per primary team  3. Paroxysmal atrial fib/flutter - initially recognized 2/16 as above s/p DCCV x 3, then recurrence 2/21, undoubtably driven by underlying metabolic stressors as he remains febrile fighting off additional infection - BP has limited aggressive medication titration so currently on IV amiodarone + IV digoxin - current low grade tachycardia seems concordant with degree of medical illness - per d/w PCCM APP they would like to see BP stabilize with further weaning of pressors before something like Lopressor would be introduced - limited role here for DCCV given paroxysmal nature of arrhythmia this admission and the high likelihood of reversion back to afib/flutter given continued underlying physiologic stress - continue IV  heparin per pharmacy - supportive care of underlying illness  4. H/o CAD - on ASA per tube - if anemia continues to be an issue, can consider decreasing to 99m daily unless he is also on this from Covid standpoint - maintenance Brilinta on hold for now, may  consider holding altogether going forward as he may require eventual transition to DEddypending clinical course (last PCI 2016) - receiving Zetia/Crestor per tube - initial hsTroponins were negative on admission - EKG did show diffuse ST depressions in the context of rapid rates, but EKG was benign when repeated in NSR - Echo 04/06/20 EF low-normal, mild AS, will await additional limited echo results  For questions or updates, please contact CElevaPlease consult [email or web address] for contact info under Cardiology/STEMI.  Signed, DCharlie Pitter PA-C 04/13/2020, 8:27 AM    Patient seen and examined.  Agree with above documentation.  On exam, patient is intubated, sedated, tachycardic, irregular, no murmurs, mechanical breath sounds, no edema.  Echocardiogram today shows EF 60 to 65%, technically difficult study but no significant valvular disease seen.  Telemetry reviewed, shows atrial fibrillation with rates 100-110s.  Continue IV heparin.  Continue IV amiodarone and digoxin for rate control.  Digoxin level 0.7 today.  CDonato Heinz MD

## 2020-04-13 NOTE — TOC Progression Note (Signed)
Transition of Care [facility]) - Progression Note    Patient Details  Name: Calvin Cruz MRN: [id number] Date of Birth: 04/13/1960  Transition of Care [facility]) CM/SW Contact  Leeroy Cha, RN Phone Number: 04/13/2020, 9:10 AM  Clinical Narrative:    [place] Hospital Events:   1/15 Admit to [facility] 1/18 Endoscopic insertion of NG tube 1/20 start remdesivir, steroids 1/24 needing high flow oxygen and NRB 2/01 start TPN 2/5 Intubated and tx to [facility] 2/6 Stop TPN, start tube feeds he has an OG tube 2/8 Weaning benzos, started Precedex 2/10 pneumo-mediastinum, dropped tidal volume 540 cc2/10 pneumomediastinum/subcutaneous emphysema due to barotrauma 2/13 dysynchronous with vent over night/ placed on propofol by elink around  4am but not breathing over vent on am rounds / still on low doses of levophed. 2/13 so increased clonazepam per FT to 2 mg qid as well trial of seroquel 100 mg bid starting 2/13 am  2/14 changed BIS goal to -2 stopped precedex. Added librium and valproic acid. Solumedrol changed to pred. Spiked fever over the day. Got more tachycardic, O2 requirement increased. Sputum culture sent. vanc and cefepime started empirically for HCAP.  2/15 peep requirements up. Recurrent septic shock 2/2 HCAP. On neo as norepi exacerbated tachycardia further. abd more distended c/w recurrent ileus. Had urinary retention foley replaced. Started on urecholine.  2/16 better,PEEP/FIO2 needs down, tachycardia resolved. Ileus better changing FT to gravity from LIWS. Stopping vanc. Starting to wean fent first. New onset Fib w/ RVR and rate in 200s w/ associated hypotension. Attempted cardioversion X 3, each time would  Convert then go back into AF w/ RVR. Loaded w/ amio X 2, gtt started and started on lopressor as well as heparin gtt. CEs negative 2/17 Sinus tach, hypertensive requiring PRN treatment. Midodrine stopped. abd still soft. Starting trickle feeds. ECHO ejection fraction, by estimation, is  50 to 55%. The left ventricle has low normal function. Left ventricular endocardial border not optimally defined to evaluate regional wall motion. Left ventricular diastolic parameters were normal. Lopressor increased. Aline placed given concern about accuracy IV lasix administered. Had to stop lantus due to hypoglycemia. Seen by palliative. Goals of care discussed. Still wanted aggressive care 2/18 pushing diuresis. Remains in NSR. Requiring intermittent Neo but felt drug related. Lopressor dosing decreased. Asked nursing to preferentially wean fent and changed RASS goal to -2 to -3 2/19 Off pressors this morning. However intermittently has hypotension requiring vasopressors. 2/21 AF RVR ongoing with rates in 150s, Cards consult > Digoxin load.  2/22 spiking fever. Pressor dependence worse. Vanc and meropenem started; BC + staph epi 2/23 Sputum + ecoli. Still on neo and vasopressin but able to wean some. Echo completed.  PLAN FOLLOIWING FOR LTACH PLACEMENT     Barriers to Discharge: Continued Medical Work up  Expected Discharge Plan and Services                                                 Social Determinants of Health (SDOH) Interventions    Readmission Risk Interventions No flowsheet data found.

## 2020-04-13 NOTE — Progress Notes (Signed)
[place] for Heparin Infusion Indication: atrial fibrillation  Allergies  Allergen Reactions  . Metronidazole Itching    Unknown  . Ciprofloxacin Itching and Nausea And Vomiting  . Dexilant [Dexlansoprazole] Diarrhea  . Morphine And Related Itching  . Doxycycline Itching, Other (See Comments) and Rash    Redness of skin, burning sensation Unknown     Patient Measurements: Height: 6\' 6"  (198.1 cm) Weight: 95.8 kg (211 lb 3.2 oz) IBW/kg (Calculated) : 91.4 Heparin Dosing Weight: 94 kg  Vital Signs: Temp: 101.6 F (38.7 C) (02/23 0400) Temp Source: Axillary (02/23 0400) BP: 133/53 (02/22 1940) Pulse Rate: 112 (02/23 0400)  Labs: Recent Labs    04/11/20 0446 04/11/20 1915 04/12/20 0447 04/12/20 1633 04/13/20 0428 04/13/20 0429  HGB 11.7*  --  10.2*  --  9.1*  --   HCT 36.2*  --  32.6*  --  28.7*  --   PLT 269  --  249  --  275  --   HEPARINUNFRC 0.67  --  0.64  --   --  1.02*  CREATININE  --    < > 0.31* 0.53* 0.54*  --    < > = values in this interval not displayed.    Estimated Creatinine Clearance: 125.4 mL/min (A) (by C-G formula based on SCr of 0.54 mg/dL (L)).  Assessment: 60 y/o M admitted with respiratory failure due to COVID-19 PNA developed atrial fibrillation with rapid ventricular response, amiodarone drip started. Pharmacy consulted to initiate heparin infusion.  Today, 04/13/2020   Heparin level 1.02 supra-therpeutic on 2000 units/hr  CBC stable: Hgb down to 19.1, Plt WNL  No issues with infusion/line or bleeding per RN  Remains on ASA 325mg   Goal of Therapy:  Heparin level 0.3-0.7 units/ml Monitor platelets by anticoagulation protocol: Yes   Plan:    Hold heparin drip x 1 hour Decrease heparin rate to 1800 units/hr Check heparin level in  6 hours Monitor CBC closely and s/s of bleeding  Dolly Rias RPh 04/13/2020, 5:19 AM

## 2020-04-13 NOTE — Progress Notes (Addendum)
[facility] Progress Note Patient Name: Calvin Cruz DOB: Sep 24, 1960 MRN: [id number]   Date of Service  04/13/2020  HPI/Events of Note  Patient with atrial flutter and RVR despite Amiodarone infusion, BP 115/51 at the moment. Digoxin level 0.7.  eICU Interventions  Digoxin 0.25 mg iv given x 2 without termination of the rhythm. Will add a Cardizem infusion in an attempt to control the rate, and plan to cardiovert patient if he becomes unstable. Stat portable CXR ordered to screen for any cardio-pulmonary triggers for the atrial flutter. KCL 20 meq iv over 2 hours given to try to bring his K+ above 4.0, given his resistant arrhythmia.        Frederik Pear 04/13/2020, 11:29 PM

## 2020-04-13 NOTE — Progress Notes (Signed)
Inpatient Diabetes Program Recommendations  AACE/ADA: New Consensus Statement on Inpatient Glycemic Control (2015)  Target Ranges:  Prepandial:   less than 140 mg/dL      Peak postprandial:   less than 180 mg/dL (1-2 hours)      Critically ill patients:  140 - 180 mg/dL   Lab Results  Component Value Date   GLUCAP 251 (H) 04/13/2020   HGBA1C 6.7 (H) 03/07/2020    Review of Glycemic Control  CBGs this am: 285, 251  Glucose: 310 mg/dL Current orders for Inpatient glycemic control: Lantus 12 units BID, Novolog 0-20 units Q4H + 2 units Q4H  TF Pivot 1.5 @ 60/H  Inpatient Diabetes Program Recommendations:     Increase Lantus to 14 units BID Increase Novolog to 4 units Q4H  Follow closely.  Thank you. Lorenda Peck, RD, LDN, CDE Inpatient Diabetes Coordinator [phone]

## 2020-04-13 NOTE — Progress Notes (Signed)
NAME:  Calvin Cruz, MRN:  [id number], DOB:  1960-03-03, LOS: 42 ADMISSION DATE:  02/29/2020, CONSULTATION DATE:  03/21/2020 REFERRING MD:  Dr. Manuella Ghazi, [facility], CHIEF COMPLAINT:  Short of breath   Brief History:  60 year old never vaccinated for covid with ARDS secondary to COVID-19 pneumonia, small bowel obstruction/ileus Intubated on 2/5  and transferred to [facility]  Past Medical History:  Ulcerative colitis s/p colectomy, Gout, CAD s/p DES, HTN, HLD, Nephrolithiasis, DM, Erythrocytosis  Significant Hospital Events:   1/15 Admit to [facility] 1/18 Endoscopic insertion of NG tube 1/20 start remdesivir, steroids 1/24 needing high flow oxygen and NRB 2/01 start TPN 2/5 Intubated and tx to [facility] 2/6 Stop TPN, start tube feeds he has an OG tube 2/8 Weaning benzos, started Precedex 2/10 pneumo-mediastinum, dropped tidal volume 540 cc2/10 pneumomediastinum/subcutaneous emphysema due to barotrauma 2/13 dysynchronous with vent over night/ placed on propofol by elink around  4am but not breathing over vent on am rounds / still on low doses of levophed. 2/13 so increased clonazepam per FT to 2 mg qid as well trial of seroquel 100 mg bid starting 2/13 am  2/14 changed BIS goal to -2 stopped precedex. Added librium and valproic acid. Solumedrol changed to pred. Spiked fever over the day. Got more tachycardic, O2 requirement increased. Sputum culture sent. vanc and cefepime started empirically for HCAP.  2/15 peep requirements up. Recurrent septic shock 2/2 HCAP. On neo as norepi exacerbated tachycardia further. abd more distended c/w recurrent ileus. Had urinary retention foley replaced. Started on urecholine.  2/16 better,PEEP/FIO2 needs down, tachycardia resolved. Ileus better changing FT to gravity from LIWS. Stopping vanc. Starting to wean fent first. New onset Fib w/ RVR and rate in 200s w/ associated hypotension. Attempted cardioversion X 3, each time would  Convert then go back into AF w/  RVR. Loaded w/ amio X 2, gtt started and started on lopressor as well as heparin gtt. CEs negative 2/17 Sinus tach, hypertensive requiring PRN treatment. Midodrine stopped. abd still soft. Starting trickle feeds. ECHO ejection fraction, by estimation, is 50 to 55%. The left ventricle has low normal function. Left ventricular endocardial border not optimally defined to evaluate regional wall motion. Left ventricular diastolic parameters were normal. Lopressor increased. Aline placed given concern about accuracy IV lasix administered. Had to stop lantus due to hypoglycemia. Seen by palliative. Goals of care discussed. Still wanted aggressive care 2/18 pushing diuresis. Remains in NSR. Requiring intermittent Neo but felt drug related. Lopressor dosing decreased. Asked nursing to preferentially wean fent and changed RASS goal to -2 to -3 2/19 Off pressors this morning. However intermittently has hypotension requiring vasopressors. 2/21 AF RVR ongoing with rates in 150s, Cards consult > Digoxin load.  2/22 spiking fever. Pressor dependence worse. Vanc and meropenem started; BC + staph epi 2/23 Sputum + ecoli. Still on neo and vasopressin but able to wean some. Echo completed.   Consults:  Gastroenterology s/o 1/28 Surgery s/o 1/19  Procedures:  Rt PICC 2/01 >>   Significant Diagnostic Tests:   CT abd/pelvis 1/16 >> lower lungs clear, mildly dilated air and fluid-filled loops of ileum measuring up to 3.2 cm. No clear transition point or focal wall narrowing  CT angio chest 1/28 >> diffuse b/l airspace opacity consistent with COVID 19 pneumonia  CT chest WO 2/10 >> diffuse pneumo mediastinum and subcutaneous emphysema, extensive bilateral groundglass disease  ECHO 2/16: ejection fraction, by estimation, is 50 to 55%. The left ventricle has low normal function. Left ventricular  endocardial border not optimally defined to evaluate regional wall motion. Left ventricular diastolic parameters were  normal. Micro Data:  COVID PCR 1/15 >> Positive Flu PCR 1/15 >> negative Urine culture 2/2-pansensitive E. Coli Blood 2/21 >GPC staph epi and RCID + MRSA>>> Urine 2/21 >neg Trach aspirate 2/21 >Mod ecoli   Antimicrobials:  Remdesivir 1/20 >> 1/24 Steroids 1/20 >>  Baricitinib 1/27 >> 2/6 Unasyn 2/5 >>  2/12 Cefepime 2/14>>>off vanc 2/14>>>2/16 Meropenem 2/22>>> vanc 2/22>>>  Interim History / Subjective:  Still febrile  Getting ECHO  Objective   Blood pressure (Abnormal) 133/53, pulse (Abnormal) 120, temperature (Abnormal) 101.6 F (38.7 C), temperature source Axillary, resp. rate (Abnormal) 35, height 6\' 6"  (1.981 m), weight 95.8 kg, SpO2 93 %.    Vent Mode: PRVC FiO2 (%):  [90 %] 90 % Set Rate:  [28 bmp] 28 bmp Vt Set:  [450 mL] 450 mL PEEP:  [12 cmH20] 12 cmH20 Plateau Pressure:  [28 cmH20-38 cmH20] 32 cmH20   Intake/Output Summary (Last 24 hours) at 04/13/2020 0818 Last data filed at 04/13/2020 0700 Gross per 24 hour  Intake 5432.18 ml  Output 2300 ml  Net 3132.18 ml   Filed Weights   04/10/20 0347 04/11/20 0500 04/13/20 0400  Weight: 87.6 kg 92.3 kg 95.8 kg      Physical Exam:  General sedated on vent HENT NCAT no JVD orally intubated Pulm dec bilaterally. stil on 12 PEEP and FIO2 90% sats 93% and pPlat 28 Card tachy irreg w/ afib on tele abd soft tol TF Ext warm. + pulses trace dep edema Neuro sedated GU foley cath in place. Clear yellow    Resolved Hospital Problem list   Presented also with gastroenteritis and increased stool volumes on presentation-->resolved SBO/ileus-->resolved  E coli UTI-->treated and resolved. (completed Rx 2/12) Hyponatremia  Septic shock 2/2 HCAP -->resolved and off pressors as of 2/17. Completed 7 days of Cefepime on 2/20 Drug induced Hypotension/ was Hypertensive 2/17.  Assessment & Plan:   Acute Hypoxic Respiratory failure w/ ARDS from COVID 19 pneumonia; complicated by HCAP 4/78 (NOS) and Now Ecoli HCAP  (2/22) Still needing high FIO2 an PEEP Pcxr: ett and PICC good position. Right > left airspace disease. Aeration actually even a little worse today  Plan: Cont low VT ventilation  PPlat goal < 30  Cont pred taper 10mg /d PAD protocol RASS goal -2 to -3 Am cxr Need to assess CVP abx per below   Septic shock w/ staph epidermis bacteremia/ MRSA also detected (2 of 2 cultures so suspect this is real) Still febrile Wbc rising Plan Line change today Day 2 meropenem and vanc ->can narrow soon as final culture data back Keep euvolemic->check CVP Titrate neo for MAP > 65 and hold vasopressin at current dose until pneo requirements < 200 (lookd like plenty of room to wean down as BP in 295A)  Acute metabolic encephalopathy secondary to delirium Agitation on mechanical ventilation Plan: Cont PAD protocol w/ goal tbd as above Cont librium 25 qid Klonopin 2mg  qid seroquel 100 bid Valproic acid 250 qid Would like to further titrate down gtts but need to get vent requirements better  Atrial flutter w/ RVR: Difficult to manage. BB limited due to hypotension, not responding well to amiodarone. Electrical cardioversion x 3 on 2/16 with limited benefit Plan: Cont tele Cont systemic ac Cont amio and dig k >4 Mg > 2   H/o CAD (DES 2016), HLD, HFrEF (EF slightly reduced to 50%)  Plan: Cont asa, crestor and  zetia Brilinta to be eventually added back (needs trach first)   Fluid and electrolyte imbalance:  hypophosphatemia  Plan: Replace  Recheck am    History of ulcerative colitis. W/ recurrent SBO (2/2 h/o adhesions) -abd more distended on 2/15; stopped tubefeeds and placed FT to LIWS. His abd is soft again and is tolerating tube feeds again. 2/18 KUB with improving distension Plan Cont tubefeeds Watch abd  DM type 2 poorly controlled with steroid induced hyperglycemia. - glucose control poor. He is very labile, suspect mix of sepsis and hydrocortisone large contributing factors  currently  Plan Inc lantus to 15  Bid Change TF coverage to 4 q 4 Cont ssi   Mild anemia of critical illness hgb still stable. No evidence of bleeding Plan Trending cbc Transfusion trigger <7  Best practice (evaluated daily)  Diet: TF DVT prophylaxis: heparin infusion for AF GI prophylaxis: protonix Mobility: Bedrest Disposition: ICU Family:  Will update wife daily  Code Status: full  Lines/tubes->has PICC and foley; will remove PICC and place CVL

## 2020-04-13 NOTE — Procedures (Signed)
Central Venous Catheter Insertion Procedure Note  Calvin Cruz  [id number]  1960-07-15  Date:04/13/20  Time:10:32 AM   Provider Performing:Pete E Kary Kos   Procedure: Insertion of Non-tunneled Central Venous [phone]) with US guidance (62836)   Indication(s) Medication administration  Consent Risks of the procedure as well as the alternatives and risks of each were explained to the patient and/or caregiver.  Consent for the procedure was obtained and is signed in the bedside chart  Anesthesia Topical only with 1% lidocaine   Timeout Verified patient identification, verified procedure, site/side was marked, verified correct patient position, special equipment/implants available, medications/allergies/relevant history reviewed, required imaging and test results available.  Sterile Technique Maximal sterile technique including full sterile barrier drape, hand hygiene, sterile gown, sterile gloves, mask, hair covering, sterile ultrasound probe cover (if used).  Procedure Description Area of catheter insertion was cleaned with chlorhexidine and draped in sterile fashion.  With real-time ultrasound guidance a central venous catheter was placed into the left internal jugular vein. Nonpulsatile blood flow and easy flushing noted in all ports.  The catheter was sutured in place and sterile dressing applied.  Complications/Tolerance None; patient tolerated the procedure well. Chest X-ray is ordered to verify placement for internal jugular or subclavian cannulation.   Chest x-ray is not ordered for femoral cannulation.  EBL Minimal  Specimen(s) None  Erick Colace ACNP-BC [place] Pager # [phone] OR # [phone] if no answer

## 2020-04-13 NOTE — Progress Notes (Signed)
[place] for Heparin Infusion Indication: atrial fibrillation  Allergies  Allergen Reactions  . Metronidazole Itching    Unknown  . Ciprofloxacin Itching and Nausea And Vomiting  . Dexilant [Dexlansoprazole] Diarrhea  . Morphine And Related Itching  . Doxycycline Itching, Other (See Comments) and Rash    Redness of skin, burning sensation Unknown     Patient Measurements: Height: 6\' 6"  (198.1 cm) Weight: 95.8 kg (211 lb 3.2 oz) IBW/kg (Calculated) : 91.4 Heparin Dosing Weight: 94 kg  Vital Signs: Temp: 101.1 F (38.4 C) (02/23 1800) Temp Source: Axillary (02/23 1800) Pulse Rate: 111 (02/23 1900)  Labs: Recent Labs    04/11/20 0446 04/11/20 1915 04/12/20 0447 04/12/20 1633 04/13/20 0428 04/13/20 0429 04/13/20 1240 04/13/20 1706 04/13/20 2007  HGB 11.7*  --  10.2*  --  9.1*  --   --   --   --   HCT 36.2*  --  32.6*  --  28.7*  --   --   --   --   PLT 269  --  249  --  275  --   --   --   --   HEPARINUNFRC 0.67  --  0.64  --   --  1.02* 0.70  --  0.77*  CREATININE  --    < > 0.31* 0.53* 0.54*  --   --  0.52*  --    < > = values in this interval not displayed.    Estimated Creatinine Clearance: 125.4 mL/min (A) (by C-G formula based on SCr of 0.52 mg/dL (L)).  Assessment: 60 y/o M admitted with respiratory failure due to COVID-19 PNA developed atrial fibrillation with rapid ventricular response, amiodarone drip started. Pharmacy consulted to initiate heparin infusion.  Today, 04/13/2020 PM assessment  Heparin level 0.77 after heparin rate decreased from 1800 to 1700 units/hr. Level at the high end of goal  CBC: Hgb down to 9.1, Plt WNL  No issues with infusion/line or bleeding reported  Remains on ASA 325mg   Goal of Therapy:  Heparin level 0.3-0.7 units/ml Monitor platelets by anticoagulation protocol: Yes   Plan:    Decrease heparin infusion to 1600 unit/hr Daily heparin level and CBC Monitor s/s of  bleeding  Eudelia Bunch, Pharm.D 04/13/2020 8:50 PM Clinical Pharmacist [facility] main pharmacy [phone] 04/13/2020 8:49 PM

## 2020-04-13 NOTE — Progress Notes (Signed)
[place] for Heparin Infusion Indication: atrial fibrillation  Allergies  Allergen Reactions  . Metronidazole Itching    Unknown  . Ciprofloxacin Itching and Nausea And Vomiting  . Dexilant [Dexlansoprazole] Diarrhea  . Morphine And Related Itching  . Doxycycline Itching, Other (See Comments) and Rash    Redness of skin, burning sensation Unknown     Patient Measurements: Height: 6\' 6"  (198.1 cm) Weight: 95.8 kg (211 lb 3.2 oz) IBW/kg (Calculated) : 91.4 Heparin Dosing Weight: 94 kg  Vital Signs: Temp: 102.2 F (39 C) (02/23 1200) Temp Source: Axillary (02/23 1200) BP: 148/57 (02/23 0816) Pulse Rate: 120 (02/23 1300)  Labs: Recent Labs    04/11/20 0446 04/11/20 1915 04/12/20 0447 04/12/20 1633 04/13/20 0428 04/13/20 0429 04/13/20 1240  HGB 11.7*  --  10.2*  --  9.1*  --   --   HCT 36.2*  --  32.6*  --  28.7*  --   --   PLT 269  --  249  --  275  --   --   HEPARINUNFRC 0.67  --  0.64  --   --  1.02* 0.70  CREATININE  --    < > 0.31* 0.53* 0.54*  --   --    < > = values in this interval not displayed.    Estimated Creatinine Clearance: 125.4 mL/min (A) (by C-G formula based on SCr of 0.54 mg/dL (L)).  Assessment: 60 y/o M admitted with respiratory failure due to COVID-19 PNA developed atrial fibrillation with rapid ventricular response, amiodarone drip started. Pharmacy consulted to initiate heparin infusion.  Today, 04/13/2020   Heparin level 0.7, decreased to upper end of therapeutic range on heparin 1800 units/hr  CBC stable: Hgb down to 9.1, Plt WNL  No issues with infusion/line or bleeding reported  Remains on ASA 325mg   Goal of Therapy:  Heparin level 0.3-0.7 units/ml Monitor platelets by anticoagulation protocol: Yes   Plan:    Decrease to IV heparin infusion at 1700 unit/hr Heparin level in 6 hours Daily heparin level and CBC Monitor s/s of bleeding  Gretta Arab PharmD, BCPS Clinical  Pharmacist [facility] main pharmacy [phone] 04/13/2020 1:15 PM

## 2020-04-14 ENCOUNTER — Inpatient Hospital Stay (HOSPITAL_COMMUNITY): Payer: Commercial Managed Care - PPO

## 2020-04-14 DIAGNOSIS — I4891 Unspecified atrial fibrillation: Secondary | ICD-10-CM | POA: Diagnosis not present

## 2020-04-14 DIAGNOSIS — Z9861 Coronary angioplasty status: Secondary | ICD-10-CM | POA: Diagnosis not present

## 2020-04-14 DIAGNOSIS — I2510 Atherosclerotic heart disease of native coronary artery without angina pectoris: Secondary | ICD-10-CM | POA: Diagnosis not present

## 2020-04-14 DIAGNOSIS — J9601 Acute respiratory failure with hypoxia: Secondary | ICD-10-CM | POA: Diagnosis not present

## 2020-04-14 DIAGNOSIS — R579 Shock, unspecified: Secondary | ICD-10-CM | POA: Diagnosis not present

## 2020-04-14 LAB — BLOOD GAS, ARTERIAL
Acid-Base Excess: 12.6 mmol/L — ABNORMAL HIGH (ref 0.0–2.0)
Bicarbonate: 40.2 mmol/L — ABNORMAL HIGH (ref 20.0–28.0)
Drawn by: 27021
FIO2: 90
MECHVT: 450 mL
O2 Saturation: 86.9 %
PEEP: 12 cmH2O
Patient temperature: 38.9
RATE: 28 resp/min
pCO2 arterial: 85.4 mmHg (ref 32.0–48.0)
pH, Arterial: 7.306 — ABNORMAL LOW (ref 7.350–7.450)
pO2, Arterial: 64.9 mmHg — ABNORMAL LOW (ref 83.0–108.0)

## 2020-04-14 LAB — CULTURE, BLOOD (ROUTINE X 2): Special Requests: ADEQUATE

## 2020-04-14 LAB — GLUCOSE, CAPILLARY
Glucose-Capillary: 322 mg/dL — ABNORMAL HIGH (ref 70–99)
Glucose-Capillary: 253 mg/dL — ABNORMAL HIGH (ref 70–99)
Glucose-Capillary: 226 mg/dL — ABNORMAL HIGH (ref 70–99)
Glucose-Capillary: 232 mg/dL — ABNORMAL HIGH (ref 70–99)
Glucose-Capillary: 242 mg/dL — ABNORMAL HIGH (ref 70–99)
Glucose-Capillary: 191 mg/dL — ABNORMAL HIGH (ref 70–99)
Glucose-Capillary: 205 mg/dL — ABNORMAL HIGH (ref 70–99)
Glucose-Capillary: 219 mg/dL — ABNORMAL HIGH (ref 70–99)
Glucose-Capillary: 266 mg/dL — ABNORMAL HIGH (ref 70–99)

## 2020-04-14 LAB — COMPREHENSIVE METABOLIC PANEL
ALT: 44 U/L (ref 0–44)
AST: 38 U/L (ref 15–41)
Albumin: 1.8 g/dL — ABNORMAL LOW (ref 3.5–5.0)
Alkaline Phosphatase: 51 U/L (ref 38–126)
Anion gap: 7 (ref 5–15)
BUN: 43 mg/dL — ABNORMAL HIGH (ref 8–23)
CO2: 40 mmol/L — ABNORMAL HIGH (ref 22–32)
Calcium: 7.4 mg/dL — ABNORMAL LOW (ref 8.9–10.3)
Chloride: 95 mmol/L — ABNORMAL LOW (ref 98–111)
Creatinine, Ser: 0.54 mg/dL — ABNORMAL LOW (ref 0.61–1.24)
GFR, Estimated: >60 mL/min (ref >60)
Glucose, Bld: 271 mg/dL — ABNORMAL HIGH (ref 70–99)
Potassium: 4 mmol/L (ref 3.5–5.1)
Sodium: 142 mmol/L (ref 135–145)
Total Bilirubin: 0.7 mg/dL (ref 0.3–1.2)
Total Protein: 5.1 g/dL — ABNORMAL LOW (ref 6.5–8.1)

## 2020-04-14 LAB — CBC
HCT: 28 % — ABNORMAL LOW (ref 39.0–52.0)
Hemoglobin: 8.7 g/dL — ABNORMAL LOW (ref 13.0–17.0)
MCH: 31.4 pg (ref 26.0–34.0)
MCHC: 31.1 g/dL (ref 30.0–36.0)
MCV: 101.1 fL — ABNORMAL HIGH (ref 80.0–100.0)
Platelets: 292 10*3/uL (ref 150–400)
RBC: 2.77 MIL/uL — ABNORMAL LOW (ref 4.22–5.81)
RDW: 15.4 % (ref 11.5–15.5)
WBC: 18.7 10*3/uL — ABNORMAL HIGH (ref 4.0–10.5)
nRBC: 1.3 % — ABNORMAL HIGH (ref 0.0–0.2)

## 2020-04-14 LAB — HEPARIN LEVEL (UNFRACTIONATED): Heparin Unfractionated: 0.64 IU/mL (ref 0.30–0.70)

## 2020-04-14 MED ORDER — IBUPROFEN 100 MG/5ML PO SUSP
200.0000 mg | Freq: Once | ORAL | Status: AC
  Administered 2020-04-14: 200 mg
  Filled 2020-04-14: qty 10

## 2020-04-14 MED ORDER — PIVOT 1.5 CAL PO LIQD
1000.0000 mL | ORAL | Status: DC
  Administered 2020-04-14: 1000 mL
  Filled 2020-04-14 (×3): qty 1000

## 2020-04-14 MED ORDER — IOHEXOL 9 MG/ML PO SOLN
500.0000 mL | ORAL | Status: AC
  Administered 2020-04-14: 500 mL via ORAL

## 2020-04-14 MED ORDER — SODIUM CHLORIDE 0.9 % IV SOLN
1.2500 ng/kg/min | INTRAVENOUS | Status: DC
  Administered 2020-04-14: 5 ng/kg/min via INTRAVENOUS
  Filled 2020-04-14 (×2): qty 1

## 2020-04-14 MED ORDER — SODIUM CHLORIDE 0.9 % IV SOLN
1.0000 g | Freq: Three times a day (TID) | INTRAVENOUS | Status: DC
  Administered 2020-04-14 (×2): 1 g via INTRAVENOUS
  Filled 2020-04-14 (×4): qty 1

## 2020-04-14 MED ORDER — MIDAZOLAM HCL 2 MG/2ML IJ SOLN
2.0000 mg | Freq: Once | INTRAMUSCULAR | Status: AC
  Administered 2020-04-14: 2 mg via INTRAVENOUS
  Filled 2020-04-14: qty 2

## 2020-04-14 MED ORDER — DIGOXIN 0.25 MG/ML IJ SOLN: 0.1250 mg | Freq: Every day | INTRAMUSCULAR | Status: DC

## 2020-04-14 MED ORDER — IOHEXOL 300 MG/ML  SOLN
100.0000 mL | Freq: Once | INTRAMUSCULAR | Status: AC | PRN
  Administered 2020-04-14: 100 mL via INTRAVENOUS

## 2020-04-14 MED ORDER — INSULIN ASPART 100 UNIT/ML ~~LOC~~ SOLN
8.0000 [IU] | SUBCUTANEOUS | Status: DC
  Administered 2020-04-14 – 2020-04-15 (×4): 8 [IU] via SUBCUTANEOUS

## 2020-04-14 MED ORDER — VECURONIUM BROMIDE 10 MG IV SOLR
8.0000 mg | Freq: Once | INTRAVENOUS | Status: AC
  Administered 2020-04-14: 8 mg via INTRAVENOUS
  Filled 2020-04-14: qty 10

## 2020-04-14 MED ORDER — PROSOURCE TF PO LIQD
45.0000 mL | Freq: Two times a day (BID) | ORAL | Status: DC
  Administered 2020-04-14: 45 mL
  Filled 2020-04-14: qty 45

## 2020-04-14 MED ORDER — INSULIN GLARGINE 100 UNIT/ML ~~LOC~~ SOLN
20.0000 [IU] | Freq: Two times a day (BID) | SUBCUTANEOUS | Status: DC
  Administered 2020-04-14: 20 [IU] via SUBCUTANEOUS
  Filled 2020-04-14 (×2): qty 0.2

## 2020-04-14 MED ORDER — IOHEXOL 9 MG/ML PO SOLN
ORAL | Status: AC
  Administered 2020-04-14: 500 mL via ORAL
  Filled 2020-04-14: qty 1000

## 2020-04-14 NOTE — Progress Notes (Signed)
[place] for Heparin Infusion Indication: atrial fibrillation  Allergies  Allergen Reactions  . Metronidazole Itching    Unknown  . Ciprofloxacin Itching and Nausea And Vomiting  . Dexilant [Dexlansoprazole] Diarrhea  . Morphine And Related Itching  . Doxycycline Itching, Other (See Comments) and Rash    Redness of skin, burning sensation Unknown     Patient Measurements: Height: 6\' 6"  (198.1 cm) Weight: 98.8 kg (217 lb 13 oz) IBW/kg (Calculated) : 91.4 Heparin Dosing Weight: 94 kg  Vital Signs: Temp: 102.02 F (38.9 C) (02/24 0900) Temp Source: Bladder (02/24 0800) Pulse Rate: 97 (02/24 0900)  Labs: Recent Labs    04/12/20 0447 04/12/20 1633 04/13/20 0428 04/13/20 0429 04/13/20 1240 04/13/20 1706 04/13/20 2007 04/14/20 0522  HGB 10.2*  --  9.1*  --   --   --   --  8.7*  HCT 32.6*  --  28.7*  --   --   --   --  28.0*  PLT 249  --  275  --   --   --   --  292  HEPARINUNFRC 0.64  --   --    < > 0.70  --  0.77* 0.64  CREATININE 0.31*   < > 0.54*  --   --  0.52*  --  0.54*   < > = values in this interval not displayed.    Estimated Creatinine Clearance: 125.4 mL/min (A) (by C-G formula based on SCr of 0.54 mg/dL (L)).  Assessment: 59 y/o M admitted with respiratory failure due to COVID-19 PNA developed atrial fibrillation with rapid ventricular response, amiodarone drip started. Pharmacy consulted to initiate heparin infusion.  Today, 04/14/2020  Heparin level 0.64 - therapeutic after heparin rate decreased from 1700 to 1600 units/hr last night  CBC: Hgb down to 8.7, Plt WNL  No issues with infusion/line or bleeding reported  Remains on ASA 325mg   Goal of Therapy:  Heparin level 0.3-0.7 units/ml Monitor platelets by anticoagulation protocol: Yes   Plan:    Continue heparin infusion @ 1600 unit/hr Daily heparin level and CBC Monitor s/s of bleeding  Eudelia Bunch, Pharm.D 04/14/2020 10:27 AM Clinical  Pharmacist [facility] main pharmacy [phone] 04/14/2020 10:27 AM

## 2020-04-14 NOTE — Progress Notes (Signed)
Pharmacy Antibiotic Note  DOV DILL is a 60 y.o. male admitted on 03/11/2020 with partial small bowel obstruction and COVID 19 infection.  Transferred to ICU on 2/5.   Patient initially completed 7 days of Unasyn and more recently completed 7 days of Cefepime for HAP, but now has re-developed a fever, leukocytosis, and hypotension.  Pharmacy has been consulted for Meropenem dosing for pneumonia, Vancomycin for bacteremia. 04/14/2020 D#3 restarted abx. E Coli PNA & MRSE bacteremia To escalate Ancef back to meropenem for clinical decline Tm 103.45, WBC 18.7 (down trend), SCr 0.54 On Neo@ 400, Vaso @ 0.3, Giaprezza added  Plan: DC Ancef>Meropenem 1g IV q8h Continue Vancomycin 1750 mg IV q12h (SCr rounded 0.8, Vd 0.72. Est AUC 485) Follow up renal function, culture results, and clinical course. Vancomycin levels as needed   Height: 6\' 6"  (198.1 cm) Weight: 98.8 kg (217 lb 13 oz) IBW/kg (Calculated) : 91.4  Temp (24hrs), Avg:102.4 F (39.1 C), Min:101.1 F (38.4 C), Max:103.46 F (39.7 C)  Recent Labs  Lab 04/08/20 1405 04/09/20 0524 04/10/20 0305 04/10/20 0428 04/11/20 0446 04/11/20 1915 04/12/20 0447 04/12/20 1633 04/13/20 0428 04/13/20 1706 04/14/20 0522  WBC  --    < > 7.5  --  19.6*  --  16.5*  --  19.4*  --  18.7*  CREATININE  --    < >  --    < >  --    < > 0.31* 0.53* 0.54* 0.52* 0.54*  LATICACIDVEN 2.2*  --   --   --   --   --   --   --   --   --   --    < > = values in this interval not displayed.    Estimated Creatinine Clearance: 125.4 mL/min (A) (by C-G formula based on SCr of 0.54 mg/dL (L)).    Allergies  Allergen Reactions  . Metronidazole Itching    Unknown  . Ciprofloxacin Itching and Nausea And Vomiting  . Dexilant [Dexlansoprazole] Diarrhea  . Morphine And Related Itching  . Doxycycline Itching, Other (See Comments) and Rash    Redness of skin, burning sensation Unknown   Antimicrobials this admission:  Remdesivir 1/20 >> 1/24 Baricitinib  1/27 >> 1/30  2/3>>2/6- DC'd out of window Ceftriaxone 2/5>> x 1 Unasyn 2/5 >> 2/12 2/14 cefepime >> 2/20 2/14 vancomycin >> 2/16, restarted 2/22 >>  2/22 Meropenem >> 2/23 restarted 2/24>> 2/23 Cefazolin >>2/24 Microbiology results:  2/2 Urine CX: E. Coli >100K CFU/ml. Pan sens F 2/5 MRSA - 1/15 covid + 2/14 TA: moderate respiratory flora, no Staph or PsA seen 2/15 UCx: NGF 2/21 UCx: NGF 2/21 Respiratory: Moderate Escherichia coli (pan-sensitive) F 2/21 salmonella - 2/21 BCx: Staph Epi: methicillin resistant staph epi, MIC Vanc = 1; F 2/23 BCx2: ngtd  Thank you for allowing pharmacy to be a part of this patient's care.  Eudelia Bunch, Pharm.D 04/14/2020 1:32 PM Clinical Pharmacist [facility] main pharmacy [phone] 04/14/2020 1:29 PM

## 2020-04-14 NOTE — Progress Notes (Signed)
Notified MD of sustained fever of 102.7 despite 2 tylenol doses and Ice packs in place  1 dose ibuprofen ordered

## 2020-04-14 NOTE — Progress Notes (Signed)
NAME:  Calvin Cruz, MRN:  [id number], DOB:  [birth year], LOS: 29 ADMISSION DATE:  03/12/2020, CONSULTATION DATE:  03/21/2020 REFERRING MD:  Dr. Manuella Ghazi, [facility], CHIEF COMPLAINT:  Short of breath   Brief History:  60 year old never vaccinated for covid with ARDS secondary to COVID-19 pneumonia, small bowel obstruction/ileus Intubated on 2/5  and transferred to [facility] long hospital  Past Medical History:  Ulcerative colitis s/p colectomy, Gout, CAD s/p DES, HTN, HLD, Nephrolithiasis, DM, Erythrocytosis  Significant Hospital Events:   1/15 Admit to [facility] 1/18 Endoscopic insertion of NG tube 1/20 start remdesivir, steroids 1/24 needing high flow oxygen and NRB 2/01 start TPN 2/5 Intubated and tx to [facility] 2/6 Stop TPN, start tube feeds he has an OG tube 2/8 Weaning benzos, started Precedex 2/10 pneumo-mediastinum, dropped tidal volume 540 cc2/10 pneumomediastinum/subcutaneous emphysema due to barotrauma 2/13 dysynchronous with vent over night/ placed on propofol by elink around  4am but not breathing over vent on am rounds / still on low doses of levophed. 2/13 so increased clonazepam per FT to 2 mg qid as well trial of seroquel 100 mg bid starting 2/13 am  2/14 changed BIS goal to -2 stopped precedex. Added librium and valproic acid. Solumedrol changed to pred. Spiked fever over the day. Got more tachycardic, O2 requirement increased. Sputum culture sent. vanc and cefepime started empirically for HCAP.  2/15 peep requirements up. Recurrent septic shock 2/2 HCAP. On neo as norepi exacerbated tachycardia further. abd more distended c/w recurrent ileus. Had urinary retention foley replaced. Started on urecholine.  2/16 better,PEEP/FIO2 needs down, tachycardia resolved. Ileus better changing FT to gravity from LIWS. Stopping vanc. Starting to wean fent first. New onset Fib w/ RVR and rate in 200s w/ associated hypotension. Attempted cardioversion X 3, each time would  Convert then go back into AF w/  RVR. Loaded w/ amio X 2, gtt started and started on lopressor as well as heparin gtt. CEs negative 2/17 Sinus tach, hypertensive requiring PRN treatment. Midodrine stopped. abd still soft. Starting trickle feeds. ECHO ejection fraction, by estimation, is 50 to 55%. The left ventricle has low normal function. Left ventricular endocardial border not optimally defined to evaluate regional wall motion. Left ventricular diastolic parameters were normal. Lopressor increased. Aline placed given concern about accuracy IV lasix administered. Had to stop lantus due to hypoglycemia. Seen by palliative. Goals of care discussed. Still wanted aggressive care 2/18 pushing diuresis. Remains in NSR. Requiring intermittent Neo but felt drug related. Lopressor dosing decreased. Asked nursing to preferentially wean fent and changed RASS goal to -2 to -3 2/19 Off pressors this morning. However intermittently has hypotension requiring vasopressors. 2/21 AF RVR ongoing with rates in 150s, Cards consult > Digoxin load.  2/22 spiking fever. Pressor dependence worse. Vanc and meropenem started; BC + staph epi 2/23 Sputum + ecoli. Still on neo and vasopressin but able to wean some. Echo completed. Sensisitives back so ecoli coverage narrowed 2/24 fib w/ RVR worse again over night. Poor glycemic control persists.  Consults:  Gastroenterology s/o 1/28 Surgery s/o 1/19  Procedures:  Rt PICC 2/01 >>   Significant Diagnostic Tests:   CT abd/pelvis 1/16 >> lower lungs clear, mildly dilated air and fluid-filled loops of ileum measuring up to 3.2 cm. No clear transition point or focal wall narrowing  CT angio chest 1/28 >> diffuse b/l airspace opacity consistent with COVID 19 pneumonia  CT chest WO 2/10 >> diffuse pneumo mediastinum and subcutaneous emphysema, extensive bilateral groundglass disease  ECHO 2/16:  ejection fraction, by estimation, is 50 to 55%. The left ventricle has low normal function. Left ventricular  endocardial border not optimally defined to evaluate regional wall motion. Left ventricular diastolic parameters were normal.  2/24 ECHO Left Ventricle: Left ventricular ejection fraction, by estimation, is 60 to 65%. Left ventricular ejection fraction by PLAX is 64 %. The left  ventricle has normal function. There is mild concentric left ventricular hypertrophy.  Mitral Valve: The mitral valve is normal in structure. Mild mitral annular calcification. Trivial mitral valve regurgitation.  Tricuspid Valve: Tricuspid valve regurgitation is trivial. Aortic Valve: The aortic valve is tricuspid. Mild aortic valve sclerosis is present, with no evidence of aortic valve stenosis.  Aorta: There is mild dilatation of the aortic root, measuring 39 mm.  Micro Data:  COVID PCR 1/15 >> Positive Flu PCR 1/15 >> negative Urine culture 2/2-pansensitive E. Coli Blood 2/21 >GPC staph epi Urine 2/21 >neg Trach aspirate 2/21 >Mod ecoli   Antimicrobials:  Remdesivir 1/20 >> 1/24 Steroids 1/20 >>  Baricitinib 1/27 >> 2/6 Unasyn 2/5 >>  2/12 Cefepime 2/14>>>off vanc 2/14>>>2/16 Meropenem 2/22>>>2/23 Ancef 2/23>>> vanc 2/22>>>  Interim History / Subjective:  Still febrile   Objective   Blood pressure (Abnormal) 148/57, pulse 96, temperature (Abnormal) 102.02 F (38.9 C), resp. rate (Abnormal) 28, height 6\' 6"  (1.981 m), weight 98.8 kg, SpO2 90 %. CVP:  [16 mmHg] 16 mmHg  Vent Mode: PRVC FiO2 (%):  [90 %] 90 % Set Rate:  [28 bmp] 28 bmp Vt Set:  [450 mL] 450 mL PEEP:  [12 cmH20] 12 cmH20 Plateau Pressure:  [24 cmH20-36 cmH20] 25 cmH20   Intake/Output Summary (Last 24 hours) at 04/14/2020 5366 Last data filed at 04/14/2020 4403 Gross per 24 hour  Intake 5646.41 ml  Output 1925 ml  Net 3721.41 ml   Filed Weights   04/11/20 0500 04/13/20 0400 04/14/20 0500  Weight: 92.3 kg 95.8 kg 98.8 kg   CVP:  [16 mmHg] 16 mmHg  Physical Exam:   General 60 year old white male he remains sedated on  mechanical ventilation, he is still on vasoactive drips but these are weaning off as well HEENT some mild temporal wasting orally intubated left IJ triple-lumen catheter dressing is clean dry and intact he is still orally intubated Pulmonary: Coarse bilateral breath sounds equal chest rise current plateau pressure 27 saturations remained 90 to 94% on PEEP of 12 FiO2 90 Cardiac: Regular irregular remains in atrial fibrillation however rate better controlled on Cardizem Abdomen: Slightly distended today a little more firm when comparing exam on 2/23 hypoactive bowel sounds last bowel movement 1 day ago.  Currently tolerating tube feeds GU: Clear yellow via Foley catheter Neuro: Remains sedated on propofol, fentanyl, and multiple other oral medications via tube Extremities: Warm and dry with brisk capillary refill, there is dependent edema  Resolved Hospital Problem list   Presented also with gastroenteritis and increased stool volumes on presentation-->resolved SBO/ileus-->resolved  E coli UTI-->treated and resolved. (completed Rx 2/12) Hyponatremia  Septic shock 2/2 HCAP -->resolved and off pressors as of 2/17. Completed 7 days of Cefepime on 2/20 Drug induced Hypotension/ was Hypertensive 2/17.  Assessment & Plan:   Acute Hypoxic Respiratory failure w/ ARDS from COVID 19 pneumonia; complicated by HCAP 4/74 (NOS) and Now Ecoli HCAP (2/22) Still needing high FIO2 an PEEP Portable chest x-ray personally reviewed bibasilar airspace disease decreased aeration bilaterally P/F ratio remains quite poor more hypercarbic Plan: Continue low tidal volume ventilation PAD protocol RASS goal -2 to -3  VAP bundle Pulse oximetry goal greater than 88 Plateau goal less than 30 Getting abdominal 1 view, if no significant distention might actually consider 1 less attempt at proning has really not much else to offer  Septic shock w/ staph epidermis bacteremia, and E. coli hcap  Antibiotics narrowed based on  sensitivity Still spiking fever, white blood cell count not much change Plan Pan scan abd/pelvis to r/o drainable pleural fluid or abd pathology Checking lipase Keep euvolemic Continue to wean phenylephrine for mean arterial pressure greater than 65 Once phenylephrine less than 200 mics will discontinue vasopressin Day #3 antibiotics, now day #2 Ancef and 3 vancomycin Will reculture blood again on 2/25 Probably needs TEE at some point Stress dose steroids   Acute metabolic encephalopathy secondary to delirium Agitation on mechanical ventilation, this seems much better controlled Plan: Discontinue propofol  Continue fentanyl drip Librium 25 4 times daily, Klonopin 2 mg 4 times daily, valproic acid 250 mg 4 times daily, Seroquel 100 mg twice daily  Atrial flutter w/ RVR: Difficult to manage. BB limited due to hypotension, not responding well to amiodarone. Electrical cardioversion x 3 on 2/16 with limited benefit Plan: Continue calcium channel blocker infusion Continue amiodarone Continue digoxin Heparin infusion Telemetry Ensure K is greater than 4 magnesium greater than 2  H/o CAD (DES 2016), HLD, HFrEF (EF slightly reduced to 50%)  Plan: Continue Crestor, aspirin and Zetia  Brilinta to be readded if we get to a point where tracheostomy is placed  Fluid and electrolyte imbalance:  hypophosphatemia  Plan: Replace and recheck as indicated  History of ulcerative colitis. W/ recurrent SBO (2/2 h/o adhesions) -abd more distended on 2/15; stopped tubefeeds and placed FT to LIWS. His abd is soft again and is tolerating tube feeds again. 2/18 KUB with improving distension Plan Reviewing CT abdomen Holding tube feeds for now  DM type 2 poorly controlled with steroid induced hyperglycemia. - glucose control poor. He is very labile, suspect mix of sepsis and hydrocortisone large contributing factors currently  Plan Increase Lantus to 20 twice daily Increase tube feed coverage to  8 units every 4 Continue sliding scale  Mild anemia of critical illness hgb still stable. No evidence of bleeding Plan Trend CBC Transfuse for hemoglobin less than 7  Best practice (evaluated daily)  Diet: TF DVT prophylaxis: heparin infusion for AF GI prophylaxis: protonix Mobility: Bedrest Disposition: ICU Family:  Will update wife daily  Code Status: full  Lines/tubes-> Foley catheter still needed as of 2/24, central line placed on 2/23   My critical care time is 35 minutes  Erick Colace ACNP-BC [place] Pager # [phone] OR # [phone] if no answer

## 2020-04-14 NOTE — Progress Notes (Addendum)
Progress Note  Patient Name: Calvin Cruz Date of Encounter: 04/14/2020  Primary Cardiologist: Glenetta Hew, MD  Subjective   Persistent fever noted. Continued issues with tachycardia overnight. Started on diltiazem infusion and re-loaded with digoxin 0.$RemoveBefore'25mg'MJmIBbZGsmviG$  IV x 2. HR now 90s.   Inpatient Medications    Scheduled Meds: . aspirin  325 mg Per Tube Daily  . chlordiazePOXIDE  25 mg Per Tube QID  . chlorhexidine gluconate (MEDLINE KIT)  15 mL Mouth Rinse BID  . Chlorhexidine Gluconate Cloth  6 each Topical Daily  . cholecalciferol  5,000 Units Per Tube Daily  . clonazePAM  2 mg Per Tube QID  . diclofenac Sodium  4 g Topical QID  . [START ON 04-19-20] digoxin  0.125 mg Intravenous Daily  . docusate  100 mg Per Tube BID  . ezetimibe  10 mg Per Tube Daily  . feeding supplement (PROSource TF)  45 mL Per Tube TID  . free water  100 mL Per Tube Q4H  . hydrocortisone sod succinate (SOLU-CORTEF) inj  50 mg Intravenous Q6H  . insulin aspart  0-20 Units Subcutaneous Q4H  . insulin aspart  8 Units Subcutaneous Q4H  . insulin glargine  20 Units Subcutaneous BID  . ipratropium-albuterol  3 mL Nebulization TID  . mouth rinse  15 mL Mouth Rinse 10 times per day  . metoCLOPramide (REGLAN) injection  5 mg Intravenous Q6H  . midodrine  10 mg Per Tube TID WC  . pantoprazole sodium  40 mg Per Tube BID  . polyethylene glycol  17 g Per Tube Daily  . QUEtiapine  100 mg Per Tube BID  . rosuvastatin  10 mg Per Tube Daily  . sennosides  5 mL Per Tube Daily  . sodium chloride flush  10-40 mL Intracatheter Q12H  . valproic acid  250 mg Per Tube QID   Continuous Infusions: . sodium chloride    . amiodarone 30 mg/hr (04/14/20 0824)  .  ceFAZolin (ANCEF) IV Stopped (04/14/20 4627)  . diltiazem (CARDIZEM) infusion 10 mg/hr (04/14/20 0824)  . feeding supplement (PIVOT 1.5 CAL) 1,000 mL (04/14/20 0929)  . fentaNYL infusion INTRAVENOUS 100 mcg/hr (04/14/20 0924)  . heparin 1,600 Units/hr (04/14/20  0824)  . phenylephrine (NEO-SYNEPHRINE) Adult infusion 280 mcg/min (04/14/20 0824)  . vancomycin Stopped (04/14/20 0536)  . vasopressin 0.03 Units/min (04/14/20 0824)   PRN Meds: Place/Maintain arterial line **AND** sodium chloride, acetaminophen (TYLENOL) oral liquid 160 mg/5 mL, fentaNYL   Vital Signs    Vitals:   04/14/20 0400 04/14/20 0500 04/14/20 0800 04/14/20 0814  BP:      Pulse: 98 96 96   Resp: (!) 28 (!) 30 (!) 28   Temp: (!) 102.56 F (39.2 C) (!) 102.2 F (39 C) (!) 102.02 F (38.9 C)   TempSrc:   Bladder   SpO2: 90% (!) 89% (!) 89% 90%  Weight:  98.8 kg    Height:        Intake/Output Summary (Last 24 hours) at 04/14/2020 0936 Last data filed at 04/14/2020 0824 Gross per 24 hour  Intake 5167.29 ml  Output 1425 ml  Net 3742.29 ml   Last 3 Weights 04/14/2020 04/13/2020 04/11/2020  Weight (lbs) 217 lb 13 oz 211 lb 3.2 oz 203 lb 7.8 oz  Weight (kg) 98.8 kg 95.8 kg 92.3 kg     Telemetry    Atrial fib/flutter currently 90s. Overnight was faster with occasional widening of the QRS complex - some episodes briefly regular and irregular  -  Personally Reviewed  Physical Exam   GEN: Critically ill M in no acute extremis on vent HEENT: Normocephalic, atraumatic, sclera non-icteric. Neck:No JVD Cardiac:Irregularly irregular, controlled, no murmurs, rubs, or gallops.  Respiratory: Coarse mechanical BS, no wheezing DD:UKGURKYH distended, BS present MS:no deformity. Extremities: No clubbing or cyanosis. Edema noted upper extremities. Less edema in lower extremities today. Extremities warm Neuro:Sedated. Psych: Unable to assess due to sedation/intubation  Labs    High Sensitivity Troponin:   Recent Labs  Lab 04/06/20 1230 04/06/20 1645 04/06/20 1751 04/06/20 2307  TROPONINIHS $RemoveBefo'14 15 16 15      'HFZbicVfAPH$ Cardiac EnzymesNo results for input(s): TROPONINI in the last 168 hours. No results for input(s): TROPIPOC in the last 168 hours.   Chemistry Recent Labs  Lab  04/08/20 0438 04/09/20 0524 04/09/20 1633 04/13/20 0428 04/13/20 1706 04/14/20 0522  NA 142 141   < > 141 142 142  K 3.4* 3.5   < > 4.1 3.8 4.0  CL 94* 94*   < > 92* 94* 95*  CO2 36* 36*   < > 40* 38* 40*  GLUCOSE 151* 221*   < > 310* 266* 271*  BUN 15 23   < > 42* 41* 43*  CREATININE 0.39* <0.30*   < > 0.54* 0.52* 0.54*  CALCIUM 7.9* 7.9*   < > 7.7* 7.4* 7.4*  PROT 5.7* 5.7*  --   --   --  5.1*  ALBUMIN 2.0* 2.0*  --   --   --  1.8*  AST 26 25  --   --   --  38  ALT 28 33  --   --   --  44  ALKPHOS 54 54  --   --   --  51  BILITOT 0.7 0.7  --   --   --  0.7  GFRNONAA >60 NOT CALCULATED   < > >60 >60 >60  ANIONGAP 12 11   < > $R'9 10 7   'jU$ < > = values in this interval not displayed.     Hematology Recent Labs  Lab 04/12/20 0447 04/13/20 0428 04/14/20 0522  WBC 16.5* 19.4* 18.7*  RBC 3.23* 2.85* 2.77*  HGB 10.2* 9.1* 8.7*  HCT 32.6* 28.7* 28.0*  MCV 100.9* 100.7* 101.1*  MCH 31.6 31.9 31.4  MCHC 31.3 31.7 31.1  RDW 14.9 15.2 15.4  PLT 249 275 292    BNPNo results for input(s): BNP, PROBNP in the last 168 hours.   DDimer No results for input(s): DDIMER in the last 168 hours.   Radiology    DG Chest Port 1 View  Result Date: 04/14/2020 CLINICAL DATA:  ARDS. EXAM: PORTABLE CHEST 1 VIEW COMPARISON:  04/13/2020. FINDINGS: Endotracheal tube, NG tube, left IJ line in stable position. Heart size stable. Diffuse bilateral pulmonary infiltrates/edema again noted. No pleural effusion or pneumothorax. IMPRESSION: 1. Lines and tubes in stable position. 2. Low lung volumes. Diffuse bilateral pulmonary infiltrates/edema again noted. No interim change. Electronically Signed   By: Marcello Moores  Register   On: 04/14/2020 05:41   DG Chest Port 1 View  Result Date: 04/13/2020 CLINICAL DATA:  Acute respiratory failure EXAM: PORTABLE CHEST 1 VIEW COMPARISON:  04/13/2020, 04/11/2020, 04/09/2020 FINDINGS: Endotracheal tube tip is about 3 cm superior to the carina. Esophageal tube tip is below the  diaphragm but incompletely visualized. Left IJ central venous catheter tip over the SVC. Slightly improved mid to lower lung airspace opacities. Moderate residual interstitial and ground-glass opacity with patchy right base  consolidation remains. Stable cardiomediastinal silhouette. No pneumothorax. IMPRESSION: 1. Endotracheal tube tip about 3 cm superior to the carina. 2. Slightly improved aeration of the mid to lower lung with moderate residual interstitial and ground-glass opacity and patchy right base consolidation. Electronically Signed   By: Donavan Foil M.D.   On: 04/13/2020 23:53   DG CHEST PORT 1 VIEW  Result Date: 04/13/2020 CLINICAL DATA:  Respiratory failure secondary to COVID pneumonia. Status post central line placement. EXAM: PORTABLE CHEST 1 VIEW COMPARISON:  04/13/2020, 0500 hours FINDINGS: Interval placement of left internal jugular vein central line with the catheter tip at the level of the mid SVC. No pneumothorax after line placement. Stable positioning of endotracheal tube with tip approximately 2.5 cm above the carina. Stable positioning right PICC line and gastric decompression tube. Bilateral pulmonary airspace disease relatively stable and more prominent in the lower lung zones compared to the upper lung zones bilaterally. No significant component of pleural fluid. IMPRESSION: Interval placement of left internal jugular vein central line with catheter tip at the level of the mid SVC. No pneumothorax. Stable bilateral pulmonary airspace disease. Electronically Signed   By: Aletta Edouard M.D.   On: 04/13/2020 11:25   DG Chest Port 1 View  Result Date: 04/13/2020 CLINICAL DATA:  ARDS. EXAM: PORTABLE CHEST 1 VIEW COMPARISON:  04/13/2019. FINDINGS: Endotracheal tube, NG tube, right PICC line in stable position. Heart size normal. Diffuse bilateral pulmonary infiltrates/edema again noted without interim change. Small right pleural effusion. IMPRESSION: 1. Lines and tubes in stable  position. 2. Diffuse bilateral pulmonary infiltrates/edema again noted without interim change. Small right pleural effusion. Electronically Signed   By: Marcello Moores  Register   On: 04/13/2020 05:34   ECHOCARDIOGRAM LIMITED  Result Date: 04/13/2020    ECHOCARDIOGRAM LIMITED REPORT   Patient Name:   Calvin Cruz Date of Exam: 04/13/2020 Medical Rec #:  [id number]       Height:       78.0 in Accession #:    [id number]      Weight:       211.2 lb Date of Birth:  [birth year]      BSA:          2.310 m Patient Age:    60 years        BP:           148/57 mmHg Patient Gender: M               HR:           121 bpm. Exam Location:  Inpatient Procedure: Limited Echo, Cardiac Doppler and Color Doppler Indications:    Bacteremia  History:        Patient has prior history of Echocardiogram examinations, most                 recent 04/06/2020. CAD, Abnormal ECG, Arrythmias:Atrial                 Fibrillation, Signs/Symptoms:Shortness of Breath and Dyspnea;                 Risk Factors:Hypertension and Dyslipidemia. Covid infection                 1/22.  Sonographer:    Roseanna Rainbow RDCS Referring Phys: [id number] [address] [facility]  Sonographer Comments: Technically difficult study due to poor echo windows and echo performed with patient supine and on artificial respirator. Supine, attempted to turn. Extremely difficult study. IMPRESSIONS  1.  Left ventricular ejection fraction, by estimation, is 60 to 65%. Left ventricular ejection fraction by PLAX is 64 %. The left ventricle has normal function. There is mild concentric left ventricular hypertrophy.  2. The mitral valve is normal in structure. Trivial mitral valve regurgitation.  3. The aortic valve is tricuspid. Mild aortic valve sclerosis is present, with no evidence of aortic valve stenosis.  4. There is mild dilatation of the aortic root, measuring 39 mm. FINDINGS  Left Ventricle: Left ventricular ejection fraction, by estimation, is 60 to 65%. Left ventricular ejection fraction by PLAX  is 64 %. The left ventricle has normal function. There is mild concentric left ventricular hypertrophy. Mitral Valve: The mitral valve is normal in structure. Mild mitral annular calcification. Trivial mitral valve regurgitation. Tricuspid Valve: Tricuspid valve regurgitation is trivial. Aortic Valve: The aortic valve is tricuspid. Mild aortic valve sclerosis is present, with no evidence of aortic valve stenosis. Aorta: There is mild dilatation of the aortic root, measuring 39 mm. LEFT VENTRICLE PLAX 2D LV EF:         Left ventricular ejection fraction by PLAX is 64 %. LVIDd:         3.80 cm LVIDs:         2.50 cm LV PW:         2.50 cm LV IVS:        1.20 cm  LEFT ATRIUM         Index LA diam:    3.20 cm 1.39 cm/m   AORTA Ao Root diam: 3.90 cm Ao Asc diam:  3.60 cm Fransico Him MD Electronically signed by Fransico Him MD Signature Date/Time: 04/13/2020/11:54:36 AM    Final     Cardiac Studies   Limited echo 04/06/20  1. Left ventricular ejection fraction, by estimation, is 50 to 55%. The  left ventricle has low normal function. Left ventricular endocardial  border not optimally defined to evaluate regional wall motion. Left  ventricular diastolic parameters were  normal.  2. Right ventricular systolic function is normal. The right ventricular  size is normal. Tricuspid regurgitation signal is inadequate for assessing  PA pressure.  3. The mitral valve is normal in structure. No evidence of mitral valve  regurgitation.  4. The aortic valve is tricuspid. Aortic valve regurgitation is trivial.  Mild aortic valve stenosis.  5. The inferior vena cava is normal in size with greater than 50%  respiratory variability, suggesting right atrial pressure of 3 mmHg.   Complete echo 03/24/20   1. Left ventricular ejection fraction, by estimation, is 55 to 60%. The  left ventricle has normal function. The left ventricle has no regional  wall motion abnormalities. There is moderate left ventricular  hypertrophy.  Left ventricular diastolic  parameters are indeterminate.  2. Right ventricular systolic function is low normal. The right  ventricular size is normal. Tricuspid regurgitation signal is inadequate  for assessing PA pressure.  3. The mitral valve is grossly normal. Trivial mitral valve  regurgitation.  4. The aortic valve is tricuspid. There is mild calcification of the  aortic valve. Aortic valve regurgitation is not visualized.  5. The inferior vena cava is normal in size with greater than 50%   Patient Profile     60 y.o.malewith a hx of CAD (NSTEMI with LCx PCI 2016), ulcerative colitis s/p ilioanal anastamosis, DM, HTN, gout, HLD,hypertriglyceridemia. He was admitted1/15/2022with Covid PNA progressing to ARDS/VDRF, also partial SBO.Hospital admission also notable for pneumomediastinum, development of fever with HCAP and  septic shockrequiring pressor support, acute metabolic encephaloapthy, abdominal distention c/w recurrent ileus, urinary retention requiring foley catheter placement, and most recently recurrent fevers and MRSE bacteremia.  Cardiology following for atrial fib/flutter. Went into afib initially 2/16s/p DCCV x 3 that day - nurse note reports went back into sinus tach after 3rd attempt. Was started on IV amiodarone/Lopressor/heparin at that time but Lopressor had to be stopped due to hypotension requiring ongoing pressor support. Cardiology signed on 2/21 due to atrial flutter 2:1 conduction as well as rapid atrial fib, prompting digoxin initiation. Recurrent arrhythmias felt likely triggered by additional infection.  Assessment & Plan    1. Acute hypoxic respiratory with ARDS from [id number]-[id number] PNA, complicated by development of HCAP with ongoing hypotension  - requiring critical care life support measures that are ongoing - currently on midodrine as well aspressor support, critical care team managing - has required re-escalation of abx this week (blood  cx + Staph epi, sputum + EColi) - may need TEE at some point but not clinically stable for this and would not change acute management - limited echo done 04/13/20 for bacteremia which was technically difficult, EF remains normal, no acute pathology - lytes managed by primary team - prior notes outline PCCM concern for prognosis  2. Acute metabolic encephalopathy - per primary team  3. Paroxysmal atrial fib/flutter - initially recognized 2/16 as above s/p DCCV x 3, then developed ongoing recurrence 2/21undoubtablydriven by underlying metabolic stressors as he remains febrile fighting off additional infection - recommend supportive care of underlying illness - BP has limited aggressive medication titration - limited role for DCCV given paroxysmal nature of arrhythmia this admission and the high likelihood of reversion back to afib/flutter given continued underlying physiologic stress - has been on IV amiodarone + IV heparin. IV digoxin added 2/21 - today's update: - due to continued rapid rates, overnight he received additional IV digoxin load overnight 0.$RemoveBeforeDE'25mg'SfBxbgTdckFbgor$  (2313 + 0015) and start of diltiazem infusion. HR 90s which seems appropriate for level of illness. Dr. Gardiner Rhyme recommended to hold digoxin dose this AM given overnight load and resume tomorrow (digoxin level ordered). QRS widening noted overnight prior to digoxin load, suspected due to aberrancy at this time  4. H/o CAD - on ASA per tube - if anemia continues to be an issue, can consider decreasing to $RemoveBefor'81mg'xVVPlrVrYFKH$  daily unless he is also on this from Covid standpoint - maintenance Brilinta on hold for now, may consider holding altogether going forward as he may require eventual transition to [facility] pending clinical course (last PCI 2016) - receiving Zetia/Crestor per tube - initial hsTroponins were negative on admission - EKG did show diffuse ST depressions in the context of rapid rates, but EKG was benign when repeated in NSR - EF remains  normal per limited echo 04/13/20  For questions or updates, please contact [facility] Please consult [email or web address] for contact info under Cardiology/STEMI.  Signed, Charlie Pitter, PA-C 04/14/2020, 9:36 AM     Patient seen and examined.  Agree with above documentation.  On exam, intubated, sedated, irregular, tachycardic, no murmurs, coarse breath sounds, no LE edema.  Overnight with having A. fib with RVR to 140s-150s and was bolused digoxin 0.25 IV x2 and Cardizem ordered.  Given additional bolus of digoxin, would hold today's dose and check level tomorrow prior to dosing.  Would avoid Cardizem given his hypotension requiring pressors.  Reviewed telemetry, rates currently appear well controlled, 90s to 100s.  Continue IV amiodarone.  Continue heparin for anticoagulation.  Donato Heinz, MD

## 2020-04-14 NOTE — Progress Notes (Signed)
Inpatient Diabetes Program Recommendations  AACE/ADA: New Consensus Statement on Inpatient Glycemic Control (2015)  Target Ranges:  Prepandial:   less than 140 mg/dL      Peak postprandial:   less than 180 mg/dL (1-2 hours)      Critically ill patients:  140 - 180 mg/dL   Lab Results  Component Value Date   GLUCAP 242 (H) 04/14/2020   HGBA1C 6.7 (H) 03/07/2020    Review of Glycemic Control   Current orders for Inpatient glycemic control: Lantus 20 units BID, Novolog 0-20 units Q4H + 8 units Q4H for TF.  Blood sugars in 200s this am.  TFs at 60/H. Needs insulin titration.  Inpatient Diabetes Program Recommendations:     Increase TF coverage to Novolog 10 units Q4H If FBS > 180 mg/dL, increase Lantus to 24 units BID  Continue to follow glucose trends. Hold Novolog if TF's are held for any reason.  Continue to follow.  Thank you. Lorenda Peck, RD, LDN, CDE Inpatient Diabetes Coordinator [phone]

## 2020-04-14 NOTE — Progress Notes (Signed)
PCO2 critical value of 85.4 reported to Marni Griffon NP

## 2020-04-14 NOTE — Progress Notes (Addendum)
Nutrition Follow-up  DOCUMENTATION CODES:   Not applicable  INTERVENTION:  - will adjust TF regimen: Pivot 1.5 @ 70 ml/hr with 45 ml Prosource TF BID. - this regimen will provide 2600 kcal, 179 grams protein, 290 grams carb, and 1275 ml free water. - free water flush to continue to be per CCM.    NUTRITION DIAGNOSIS:   Inadequate oral intake related to inability to eat as evidenced by NPO status. -ongoing  GOAL:   Patient will meet greater than or equal to 90% of their needs -to be met with TF regimen  MONITOR:   Vent status,TF tolerance,Labs,Weight trends,Skin  ASSESSMENT:   Patient is a 60 yo male with history of Ulcerative colitis s/p ileal anastomosis /colectomy, gout, CAD, DM2 and HTN. He presents with small bowel obstruction and COVID-19 pneumonia and gastroenteritis/infection.  Significant Events: 1/15- admission 1/18- endoscopic insertion of NGT 1/19- Surgery signed off 1/31- diet advanced from NPO to Low Residue 2/3- Soft diet 2/1- TPN initiation 2/5- intubation; OGT placement 2/6- TPN stopped; TF initiation 2/15- TF placed on hold d/t abdominal distention 2/17- TF resumed at trickle rate of 20 ml/hr 2/19- off pressors 2/22- fevers; resumed pressors   Patient remains intubated with OGT in place. No family or visitors present. Able to talk with CCM and with RN. TF off at time of RD visit d/t administration of contrast for pending CT.    RN reports patient was previously receiving Pivot 1.5 at goal rate of 60 ml/hr and no issues.  Order in place for Pivot 1.5 @ 60 ml/hr with 45 ml Prosource TF TID and 100 ml free water every 4 hours. This regimen is providing 2280 kcal, 168 grams protein, and 1693 ml free water.  Weight has been trending up since 2/21. Used weight from 1/29 (92.7 kg) to re-estimate kcal need as this is an average weight compared to all recordings since admission.   Per notes: - poor glycemic control--insulin regimen adjusted today (2/24) -  ecoli HCAP - plan for CT abd/pelvis - sepsis with staph epidermis bacteremia   Patient is currently intubated on ventilator support MV: 13.3 L/min Temp (24hrs), Avg:102.4 F (39.1 C), Min:101.1 F (38.4 C), Max:103.46 F (39.7 C) Propofol: none BP: 106/44 and MAP: 60  Labs reviewed; CBGs: 226-253 mg/dl, Cl: 95 mmol/l, BUN: 43 mg/dl, creatinine: 0.54 mg/dl, Ca: 7.4 mg/dl.  Medications reviewed; 5000 units cholecalciferol/day, 100 mg colace BID, 50 mg solu-cortef QID, sliding scale novolog, 8 units novolog every 4 hours, 20 units lantus BID, 5 mg IV reglan QID, 40 mg protonix per OGT BID, 17 g miralax/day, 10 mEq IV KCl x2 runs 2/24, 30 mmol IV KPhos x1 run 2/23, 5 ml senokot/day.  Drips; neo @ 350 mcg/min, vaso @ 0.03 units/min, heparin @ 1600 units/hr, amio @ 30 mg/hr, fentanyl @ 100 mcg/hr.     Diet Order:   Diet Order            Diet NPO time specified  Diet effective now                 EDUCATION NEEDS:   Not appropriate for education at this time  Skin:  Skin Assessment: Skin Integrity Issues: Skin Integrity Issues:: Stage I,DTI DTI: no location documented, but possibly ear per RN (newly documented 2/19) Stage I: sacrum  Last BM:  2/23  Height:   Ht Readings from Last 1 Encounters:  04/06/20 _0  (1.981 m)    Weight:   Wt Readings from Last  1 Encounters:  04/14/20 98.8 kg     Estimated Nutritional Needs:  Kcal:  2624 kcal Protein:  150-170 grams Fluid:  >/= 3 L/day     Jarome Matin, MS, RD, LDN, CNSC Inpatient Clinical Dietitian RD pager # available in [place]  After hours/weekend pager # available in [facility]

## 2020-04-14 NOTE — Progress Notes (Signed)
Long d/w patient's wife Maudie Mercury and Bobette Mo. They have been updated that over the last 24 hrs he is now requiring increased BP support, increased Oxygen, has had on-going fever, hyperglycemia and afib w/ RVR. Overall he is declining on all fronts.  They do not wish him to suffer  Plan Full DNR if arrests Widen abx back to meropenem and vanc Added Darryl Nestle   They know that if things continue to decline over the next 24 hours we will need to re-visit goal of care and consider w/d and transition to comfort.   Erick Colace ACNP-BC [facility] Pager # [phone] OR # [phone] if no answer

## 2020-04-15 DIAGNOSIS — K56600 Partial intestinal obstruction, unspecified as to cause: Secondary | ICD-10-CM | POA: Diagnosis not present

## 2020-04-15 MED ORDER — GLYCOPYRROLATE 1 MG PO TABS: 1.0000 mg | ORAL_TABLET | ORAL | Status: DC | PRN

## 2020-04-15 MED ORDER — DIPHENHYDRAMINE HCL 50 MG/ML IJ SOLN: 25.0000 mg | INTRAMUSCULAR | Status: DC | PRN

## 2020-04-15 MED ORDER — FENTANYL BOLUS VIA INFUSION
100.0000 ug | INTRAVENOUS | Status: DC | PRN
  Filled 2020-04-15: qty 100

## 2020-04-15 MED ORDER — ACETAMINOPHEN 650 MG RE SUPP: 650.0000 mg | Freq: Four times a day (QID) | RECTAL | Status: DC | PRN

## 2020-04-15 MED ORDER — FENTANYL CITRATE (PF) 100 MCG/2ML IJ SOLN: 50.0000 ug | INTRAMUSCULAR | Status: DC | PRN

## 2020-04-15 MED ORDER — ACETAMINOPHEN 325 MG PO TABS: 650.0000 mg | ORAL_TABLET | Freq: Four times a day (QID) | ORAL | Status: DC | PRN

## 2020-04-15 MED ORDER — GLYCOPYRROLATE 0.2 MG/ML IJ SOLN: 0.2000 mg | INTRAMUSCULAR | Status: DC | PRN

## 2020-04-15 MED ORDER — POLYVINYL ALCOHOL 1.4 % OP SOLN
1.0000 [drp] | Freq: Four times a day (QID) | OPHTHALMIC | Status: DC | PRN
  Filled 2020-04-15: qty 15

## 2020-04-15 MED ORDER — DEXTROSE 5 % IV SOLN: INTRAVENOUS | Status: DC

## 2020-04-15 MED ORDER — MIDAZOLAM 50MG/50ML (1MG/ML) PREMIX INFUSION
0.0000 mg/h | INTRAVENOUS | Status: DC
  Administered 2020-04-15: 1 mg/h via INTRAVENOUS
  Filled 2020-04-15: qty 50

## 2020-04-15 MED ORDER — MIDAZOLAM BOLUS VIA INFUSION (WITHDRAWAL LIFE SUSTAINING TX)
2.0000 mg | INTRAVENOUS | Status: DC | PRN
  Filled 2020-04-15: qty 2

## 2020-04-15 MED ORDER — GLYCOPYRROLATE 0.2 MG/ML IJ SOLN
0.2000 mg | INTRAMUSCULAR | Status: DC | PRN
  Filled 2020-04-15: qty 1

## 2020-04-15 MED ORDER — MIDAZOLAM HCL 2 MG/2ML IJ SOLN: 1.0000 mg | INTRAMUSCULAR | Status: DC | PRN

## 2020-04-15 MED ORDER — FENTANYL 2500MCG IN NS 250ML (10MCG/ML) PREMIX INFUSION
0.0000 ug/h | INTRAVENOUS | Status: DC
  Administered 2020-04-15: 100 ug/h via INTRAVENOUS
  Filled 2020-04-15: qty 250

## 2020-04-18 LAB — CULTURE, BLOOD (ROUTINE X 2)
Culture: NO GROWTH
Culture: NO GROWTH
Special Requests: ADEQUATE

## 2020-04-19 NOTE — Progress Notes (Signed)
Pt went asystole on monitor. This RN and Freight forwarder verified death. No heart or lung sounds heard. Family at bedside. All lines, tubes, and drains removed.

## 2020-04-19 NOTE — Progress Notes (Signed)
[facility] Progress Note Patient Name: NICOLAUS ANDEL DOB: January 03, 1961 MRN: [id number]   Date of Service  05/02/20  HPI/Events of Note  Patient is back in atrial fib / flutter with RVR, and blood pressures have been marginal despite addition of Giapreza and maxed out Phenylephrine and Vasopressin. Treatment options for the atrial arrhythmia are limited, given profound hypotension.   eICU Interventions  Family has been called in, he was made a DNR earlier in the day.        Kerry Kass Rainelle Sulewski 2020/05/02, 1:13 AM

## 2020-04-19 NOTE — Death Summary Note (Signed)
DEATH SUMMARY   Patient Details  Name: Calvin Cruz MRN: [id number] DOB: [birth year]  Admission/Discharge Information   Admit Date:  2020-03-12  Date of Death: Date of Death: 04/22/2020  Time of Death: Time of Death: 0811  Length of Stay: May 17, 2038  Referring Physician: Reynold Bowen, MD   Reason(s) for Hospitalization  Patient admitted to an apparent hospital on 12-Mar-2020 with abdominal pain been treated for, treated for partial small bowel obstruction Tested positive for Covid and decompensated from his Covid infection  Diagnoses  Preliminary cause of death:   Covid pneumonia Secondary Diagnoses (including complications and co-morbidities):  Principal Problem:   Partial small bowel obstruction ([place]) Active Problems:   Hyperlipidemia with target LDL less than 70   Essential hypertension   CAD S/P DES PCI-circumflex   Abdominal pain   COVID-19 virus infection   Hyperglycemia due to diabetes mellitus (HCC)   Nausea and vomiting   Gastroenteritis due to COVID-19 virus   Abdominal distention   Hypoxemia   SOB (shortness of breath)   Pressure injury of skin   Shock circulatory (HCC)   ARDS (adult respiratory distress syndrome) (HCC)   SVT (supraventricular tachycardia) ([place])   Nosocomial pneumonia   Atrial fibrillation with RVR ([place])   Atrial flutter with rapid ventricular response [facility])   [place] Hospital Course (including significant findings, care, treatment, and services provided and events leading to death)  Calvin Cruz is a 60 y.o. year old male who admitted to [facility] with partial small bowel obstruction, was being managed for small bowel obstruction.  Tested positive for Covid, decompensated from his Covid infection.  Requiring high flow oxygen and nonrebreather mask, subsequently intubated and was transferred to [facility]. Developed ARDS and was being managed for this, low tidal volume support, continued medications for his Covid  infection. Developed pneumomediastinum, He became septic during his course of treatment, was treated with antibiotics, requiring pressors Developed new Atrial fibrillation with rapid ventricular response, failed cardioversion, required amiodarone. Supportive measures continued Started spiking another fever with worsening pressor requirements, cultured and restarted on antibiotics Culture positive for E. Coli Continue conversation with family On the 24th patient was made DNR and on the 25th was made comfort measures with withdrawal of life-sustaining measures  Patient succumbed to his illness on 22-Apr-2020 at 0811 hours   Pertinent Labs and Studies  Significant Diagnostic Studies DG Chest 1 View  Result Date: 03/20/2020 CLINICAL DATA:  Follow-up COVID-19 pneumonia. EXAM: CHEST  1 VIEW COMPARISON:  03/17/2020 FINDINGS: Normal sized heart. No significant change in patchy airspace opacities throughout both lungs. No pleural fluid. Unremarkable bones. IMPRESSION: Stable bilateral pneumonia. Electronically Signed   By: Claudie Revering M.D.   On: 03/20/2020 13:29   DG Chest 1 View  Result Date: 03/17/2020 CLINICAL DATA:  COVID-19 pneumonia EXAM: CHEST  1 VIEW COMPARISON:  03/14/2020 FINDINGS: Lung volumes are small, but are symmetric and are stable since prior examination. Diffuse bilateral nodular airspace infiltrate is stable when compared to prior examination. No pneumothorax or pleural effusion. Cardiac size within normal limits. No acute bone abnormality. IMPRESSION: Stable pulmonary hypoinflation. Stable diffuse pulmonary infiltrate, likely infectious or inflammatory. Electronically Signed   By: Fidela Salisbury MD   On: 03/17/2020 06:32   DG Abd 1 View  Result Date: 04/08/2020 CLINICAL DATA:  Ileus EXAM: ABDOMEN - 1 VIEW COMPARISON:  04/06/2020 FINDINGS: NG tube is in the stomach. Decreasing bowel distension. Gas throughout a few mildly prominent small bowel loops. No organomegaly  or free air.  Prior cholecystectomy. IMPRESSION: Improving small bowel distention. Electronically Signed   By: Rolm Baptise M.D.   On: 04/08/2020 07:29   DG Abd 1 View  Result Date: 03/27/2020 CLINICAL DATA:  OG tube placement EXAM: ABDOMEN - 1 VIEW COMPARISON:  None. FINDINGS: OG tube tip is in the fundus of the stomach. IMPRESSION: OG tube tip in the stomach. Electronically Signed   By: Rolm Baptise M.D.   On: 03/27/2020 02:41   DG Abd 1 View  Result Date: 03/26/2020 CLINICAL DATA:  OG tube placement EXAM: ABDOMEN - 1 VIEW COMPARISON:  03/09/2020 FINDINGS: OG tube tip is in the stomach. Decreasing gaseous distention of bowel since previous study. Prior cholecystectomy. IMPRESSION: OG tube tip in the stomach. Electronically Signed   By: Rolm Baptise M.D.   On: 03/26/2020 22:55   CT CHEST WO CONTRAST  Result Date: 03/31/2020 CLINICAL DATA:  Respiratory failure, COVID-19 pneumonia, history of aspiration, pneumomediastinum on recent chest x-ray EXAM: CT CHEST WITHOUT CONTRAST TECHNIQUE: Multidetector CT imaging of the chest was performed following the standard protocol without IV contrast. COMPARISON:  03/30/2020, 03/18/2020 FINDINGS: Cardiovascular: The heart and great vessels are unremarkable without pericardial effusion. Normal caliber of the thoracic aorta. Mild atherosclerosis of the aorta and coronary vessels. Right-sided central venous catheter tip at the atriocaval junction. Mediastinum/Nodes: As seen on recent chest x-ray, there is diffuse pneumomediastinum and subcutaneous emphysema consistent with barotrauma. Endotracheal tube identified well above carina. Enteric catheter extends into the gastric lumen. The thyroid is unremarkable.  No pathologic adenopathy. Lungs/Pleura: There is extensive bilateral ground-glass airspace disease consistent with known COVID-19 pneumonia and ARDS. No effusion or pneumothorax. Upper Abdomen: No acute abnormality. Musculoskeletal: No acute or destructive bony lesions.  Reconstructed images demonstrate no additional findings. IMPRESSION: 1. Diffuse pneumomediastinum and subcutaneous emphysema consistent with barotrauma. No evidence of pneumothorax. 2. Extensive bilateral ground-glass airspace disease consistent with known history of COVID 19 pneumonia. 3. Support devices as above. 4.  Aortic Atherosclerosis (ICD10-I70.0). Electronically Signed   By: Randa Ngo M.D.   On: 03/31/2020 17:20   CT ANGIO CHEST PE W OR WO CONTRAST  Result Date: 03/18/2020 CLINICAL DATA:  History of COVID-19 positivity with chest pain and shortness of breath EXAM: CT ANGIOGRAPHY CHEST WITH CONTRAST TECHNIQUE: Multidetector CT imaging of the chest was performed using the standard protocol during bolus administration of intravenous contrast. Multiplanar CT image reconstructions and MIPs were obtained to evaluate the vascular anatomy. Examination is limited by respiratory artifact. CONTRAST:  160mL OMNIPAQUE IOHEXOL 350 MG/ML SOLN COMPARISON:  Chest x-ray from the previous day. FINDINGS: Cardiovascular: Thoracic aorta shows no aneurysmal dilatation or dissection. No cardiac enlargement is noted. Scattered coronary calcifications are noted. The pulmonary artery is limited by patient respiratory artifact although no definitive filling defects are identified Mediastinum/Nodes: Thoracic inlet is within normal limits. The esophagus as visualized is within normal limits. Scattered small hilar and mediastinal lymph nodes are noted likely reactive in nature. Lungs/Pleura: Lungs are well aerated bilaterally but diffuse airspace opacity is identified consistent with the given clinical history of COVID-19 pneumonia. No sizable effusion is seen. No pneumothorax is noted. Azygos lobe is noted. Upper Abdomen: Visualized upper abdomen is within normal limits. Musculoskeletal: Degenerative changes of the thoracic spine are noted. No acute bony abnormality is seen. Review of the MIP images confirms the above findings.  IMPRESSION: Diffuse bilateral airspace opacities consistent with the given clinical history of COVID-19 pneumonia. No evidence of central pulmonary emboli. Evaluation of the  more peripheral branches is limited due to patient respiratory artifact. Electronically Signed   By: Inez Catalina M.D.   On: 03/18/2020 20:51   CT CHEST ABDOMEN PELVIS W CONTRAST  Result Date: 04/14/2020 CLINICAL DATA:  Sepsis, ARDS EXAM: CT CHEST, ABDOMEN, AND PELVIS WITH CONTRAST TECHNIQUE: Multidetector CT imaging of the chest, abdomen and pelvis was performed following the standard protocol during bolus administration of intravenous contrast. CONTRAST:  139mL OMNIPAQUE IOHEXOL 300 MG/ML  SOLN COMPARISON:  04/14/2020, 03/31/2020 FINDINGS: CT CHEST FINDINGS Cardiovascular: The heart and great vessels are unremarkable without pericardial effusion. No evidence of thoracic aortic aneurysm or dissection. Minimal atherosclerosis of the aortic arch, with extensive atherosclerosis throughout the coronary vasculature. Mediastinum/Nodes: Endotracheal tube terminates at the level of the thoracic inlet. Enteric catheter extends into the gastric lumen. Left internal jugular catheter tip within the superior vena cava. No pathologic adenopathy. Lungs/Pleura: Widespread interstitial and ground-glass opacities, consistent with known ARDS and history of COVID 19 pneumonia. Dense dependent consolidation consistent with atelectasis. Diffuse bronchiectasis again noted. No effusion or pneumothorax. The pneumomediastinum and subcutaneous gas seen previously has resolved in the interim. Musculoskeletal: No acute or destructive bony lesions. Reconstructed images demonstrate no additional findings. CT ABDOMEN PELVIS FINDINGS Hepatobiliary: No focal liver abnormality is seen. Status post cholecystectomy. No biliary dilatation. Pancreas: Unremarkable. No pancreatic ductal dilatation or surrounding inflammatory changes. Spleen: Normal in size without focal  abnormality. Adrenals/Urinary Tract: Adrenal glands are unremarkable. Kidneys are normal, without renal calculi, focal lesion, or hydronephrosis. Bladder is unremarkable. Foley catheter decompresses the bladder. Stomach/Bowel: Postsurgical changes are seen from subtotal colectomy. There is diffuse distension of the distal small bowel proximal to the anastomosis with the rectum, measuring up to 6.5 cm in diameter with multiple gas fluid levels. Overall findings are compatible with ileus. No bowel wall thickening or inflammatory change. Vascular/Lymphatic: Aortic atherosclerosis. No enlarged abdominal or pelvic lymph nodes. Reproductive: Prostate is unremarkable. Other: There is diffuse subcutaneous edema. Trace free fluid in the pelvis. No free intraperitoneal gas. No abdominal wall hernia. Musculoskeletal: No acute or destructive bony lesions. Reconstructed images demonstrate no additional findings. IMPRESSION: 1. Diffuse interstitial and ground-glass opacities consistent with known history of COVID-19 pneumonia and ARDS. 2. Significant dependent bilateral atelectasis. 3. Small-bowel ileus, with diffuse distension of the distal small bowel to the level of an ileocolic anastomosis. No evidence of obstruction. 4. Subcutaneous body wall edema, with trace ascites. 5.  Aortic Atherosclerosis (ICD10-I70.0). Electronically Signed   By: Randa Ngo M.D.   On: 04/14/2020 19:37   DG Chest Port 1 View  Result Date: 04/14/2020 CLINICAL DATA:  ARDS. EXAM: PORTABLE CHEST 1 VIEW COMPARISON:  04/13/2020. FINDINGS: Endotracheal tube, NG tube, left IJ line in stable position. Heart size stable. Diffuse bilateral pulmonary infiltrates/edema again noted. No pleural effusion or pneumothorax. IMPRESSION: 1. Lines and tubes in stable position. 2. Low lung volumes. Diffuse bilateral pulmonary infiltrates/edema again noted. No interim change. Electronically Signed   By: Marcello Moores  Register   On: 04/14/2020 05:41   DG Chest Port 1  View  Result Date: 04/13/2020 CLINICAL DATA:  Acute respiratory failure EXAM: PORTABLE CHEST 1 VIEW COMPARISON:  04/13/2020, 04/11/2020, 04/09/2020 FINDINGS: Endotracheal tube tip is about 3 cm superior to the carina. Esophageal tube tip is below the diaphragm but incompletely visualized. Left IJ central venous catheter tip over the SVC. Slightly improved mid to lower lung airspace opacities. Moderate residual interstitial and ground-glass opacity with patchy right base consolidation remains. Stable cardiomediastinal silhouette. No pneumothorax. IMPRESSION: 1. Endotracheal  tube tip about 3 cm superior to the carina. 2. Slightly improved aeration of the mid to lower lung with moderate residual interstitial and ground-glass opacity and patchy right base consolidation. Electronically Signed   By: Donavan Foil M.D.   On: 04/13/2020 23:53   DG CHEST PORT 1 VIEW  Result Date: 04/13/2020 CLINICAL DATA:  Respiratory failure secondary to COVID pneumonia. Status post central line placement. EXAM: PORTABLE CHEST 1 VIEW COMPARISON:  04/13/2020, 0500 hours FINDINGS: Interval placement of left internal jugular vein central line with the catheter tip at the level of the mid SVC. No pneumothorax after line placement. Stable positioning of endotracheal tube with tip approximately 2.5 cm above the carina. Stable positioning right PICC line and gastric decompression tube. Bilateral pulmonary airspace disease relatively stable and more prominent in the lower lung zones compared to the upper lung zones bilaterally. No significant component of pleural fluid. IMPRESSION: Interval placement of left internal jugular vein central line with catheter tip at the level of the mid SVC. No pneumothorax. Stable bilateral pulmonary airspace disease. Electronically Signed   By: Aletta Edouard M.D.   On: 04/13/2020 11:25   DG Chest Port 1 View  Result Date: 04/13/2020 CLINICAL DATA:  ARDS. EXAM: PORTABLE CHEST 1 VIEW COMPARISON:   04/13/2019. FINDINGS: Endotracheal tube, NG tube, right PICC line in stable position. Heart size normal. Diffuse bilateral pulmonary infiltrates/edema again noted without interim change. Small right pleural effusion. IMPRESSION: 1. Lines and tubes in stable position. 2. Diffuse bilateral pulmonary infiltrates/edema again noted without interim change. Small right pleural effusion. Electronically Signed   By: Marcello Moores  Register   On: 04/13/2020 05:34   DG CHEST PORT 1 VIEW  Result Date: 04/11/2020 CLINICAL DATA:  Hypoxia.  Ventilator support. EXAM: PORTABLE CHEST 1 VIEW COMPARISON:  04/09/2020 FINDINGS: Endotracheal tube tip is 3 cm above the carina. Orogastric or nasogastric tube enters the abdomen. Widespread pulmonary infiltrates persist, with increasing consolidation in the right lower lung. Right arm PICC tip in the SVC above the right atrium. IMPRESSION: Lines and tubes well positioned. Persistent widespread pulmonary infiltrates with increasing consolidation in the right lower lung. Electronically Signed   By: Nelson Chimes M.D.   On: 04/11/2020 01:04   DG Chest Port 1 View  Result Date: 04/09/2020 CLINICAL DATA:  ARDS EXAM: PORTABLE CHEST 1 VIEW COMPARISON:  Yesterday FINDINGS: Endotracheal tube with tip at the clavicular heads. The enteric tube at least reaches the stomach. Right PICC with tip at the upper cavoatrial junction. Confluent airspace disease. Normal heart size. No visible effusion or pneumothorax. Extensive artifact from EKG leads IMPRESSION: Stable hardware positioning and bilateral pneumonia. Electronically Signed   By: Monte Fantasia M.D.   On: 04/09/2020 04:53   DG Chest Port 1 View  Result Date: 04/08/2020 CLINICAL DATA:  Respiratory failure EXAM: PORTABLE CHEST 1 VIEW COMPARISON:  04/06/2020 FINDINGS: For endotracheal tube and NG tube are unchanged. Diffuse bilateral airspace disease, unchanged. No visible effusions or pneumothorax. Heart is normal size. IMPRESSION: Severe diffuse  bilateral airspace disease, unchanged. Electronically Signed   By: Rolm Baptise M.D.   On: 04/08/2020 07:30   DG Chest Port 1 View  Result Date: 04/06/2020 CLINICAL DATA:  Respiratory failure.  COVID-19 infection. EXAM: PORTABLE CHEST 1 VIEW COMPARISON:  04/06/2020 FINDINGS: The patient is rotated to the right. Endotracheal tube terminates 4.5 cm above the carina. Enteric tube courses into the upper abdomen with tip not imaged. A right PICC remains in place and at least reaches the  lower SVC with tip suboptimally visualized. The cardiomediastinal silhouette is unchanged. Mixed interstitial and airspace opacities throughout both lungs have not significantly changed. No sizable pleural effusion or pneumothorax is identified. IMPRESSION: Unchanged bilateral lung opacities consistent with pneumonia. Electronically Signed   By: Logan Bores M.D.   On: 04/06/2020 13:58   DG Chest Port 1 View  Result Date: 04/06/2020 CLINICAL DATA:  COVID.  Ileus. EXAM: PORTABLE CHEST 1 VIEW COMPARISON:  04/05/2020. FINDINGS: Endotracheal tube, NG tube, right PICC line in stable position. Heart size stable. Low lung volumes. Diffuse bilateral pulmonary infiltrates/edema again noted without interim change. No prominent pleural effusion. No pneumothorax. Degenerative changes scoliosis thoracic spine. IMPRESSION: 1. Lines and tubes in stable position. 2. Low lung volumes. Diffuse bilateral pulmonary infiltrates/edema again noted without interim change. Electronically Signed   By: Marcello Moores  Register   On: 04/06/2020 06:34   DG Chest Port 1 View  Result Date: 04/05/2020 CLINICAL DATA:  60 year old male with history of acute abdominal distension hardening over the past 24 hours. Increasing oxygen requirements. ARDS secondary to COVID-19 pneumonia. EXAM: PORTABLE CHEST 1 VIEW COMPARISON:  Chest x-ray 04/04/2020. FINDINGS: An endotracheal tube is in place with tip 3.7 cm above the carina. A nasogastric tube is seen extending into the  stomach, however, the tip of the nasogastric tube extends below the lower margin of the image. There is a right upper extremity PICC with tip terminating in the superior cavoatrial junction. Lung volumes are low. Patchy multifocal ill-defined opacities and areas of interstitial prominence scattered throughout the lungs bilaterally, compatible with severe multilobar bilateral pneumonia, overall with slightly worsened aeration compared to the prior study, particularly in the mid to lower lungs bilaterally (right greater than left). No definite pleural effusions. Pulmonary vasculature is obscured. Heart size is normal. Upper mediastinal contours are within normal limits. IMPRESSION: 1. Support apparatus, as above. 2. Severe multilobar bilateral pneumonia with worsened aeration compared to the prior examination, as above. Electronically Signed   By: Vinnie Langton M.D.   On: 04/05/2020 09:41   DG Chest Port 1 View  Result Date: 04/04/2020 CLINICAL DATA:  Acute respiratory failure EXAM: PORTABLE CHEST 1 VIEW COMPARISON:  Two days ago FINDINGS: Endotracheal tube with tip at the clavicular heads. The enteric tube reaches the stomach. Right PICC with tip at the SVC. Generalized hazy chest opacification and interstitial coarsening. No visible effusion or pneumothorax. Normal heart size. IMPRESSION: Stable hardware positioning and pulmonary opacification. Electronically Signed   By: Monte Fantasia M.D.   On: 04/04/2020 06:17   DG Chest Port 1 View  Result Date: 04/02/2020 CLINICAL DATA:  60 year old male respiratory failure. COVID-19. Pneumomediastinum, subcutaneous emphysema. EXAM: PORTABLE CHEST 1 VIEW COMPARISON:  Portable chest 04/01/2020. Chest CT 03/31/2020, and earlier. FINDINGS: Portable AP semi upright view at 0409 hours. Stable mild patient rotation to the right. Stable ETT tip at the level the clavicles. Stable visible PICC line and enteric tube. Stable lung volumes and mediastinal contours. Diffuse  coarse pulmonary opacity has not significantly changed since 03/27/2020. Regression of pneumomediastinum and subcutaneous emphysema since 03/30/2020. No pneumothorax or pleural effusion. Gas in the stomach is within normal limits. Stable visualized osseous structures. IMPRESSION: 1. Stable lines and tubes. 2. Regression of pneumomediastinum and subcutaneous emphysema since 03/30/2020. 3. Underlying COVID-19 pneumonia with ventilation not significantly changed from 03/27/2020. Electronically Signed   By: Genevie Ann M.D.   On: 04/02/2020 04:45   DG CHEST PORT 1 VIEW  Result Date: 04/01/2020 CLINICAL DATA:  Respiratory failure.  EXAM: PORTABLE CHEST 1 VIEW COMPARISON:  03/30/2020 FINDINGS: 0448 hours. Endotracheal tube tip is 2.5 cm above the base of the carina. The NG tube passes into the stomach although the distal tip position is not included on the film. Right PICC line tip overlies the distal SVC level. Similar appearance diffuse bilateral airspace disease. The cardiopericardial silhouette is within normal limits for size. Interval decrease in subcutaneous emphysema noted in the upper thorax. Subtle pneumomediastinum noted over the left heart border. Telemetry leads overlie the chest. IMPRESSION: 1. Interval decrease in subcutaneous emphysema. 2. Similar appearance diffuse bilateral airspace disease. Electronically Signed   By: Misty Stanley M.D.   On: 04/01/2020 07:50   DG CHEST PORT 1 VIEW  Result Date: 03/30/2020 CLINICAL DATA:  ARDS. EXAM: PORTABLE CHEST 1 VIEW COMPARISON:  March 29, 2020. FINDINGS: The heart size and mediastinal contours are within normal limits. Endotracheal and nasogastric tubes are unchanged in position. Subcutaneous emphysema is seen over both supraclavicular regions as well as the right axillary region. Probable pneumomediastinum is noted. No definite pneumothorax or pleural effusion is noted. Mild bilateral diffuse lung opacities are noted concerning for multifocal pneumonia. The  visualized skeletal structures are unremarkable. IMPRESSION: Stable support apparatus. Subcutaneous emphysema is seen over both supraclavicular regions as well as the right axillary region. Probable pneumomediastinum is noted; CT scan is recommended for further evaluation. Mild bilateral diffuse lung opacities are noted concerning for multifocal pneumonia. Electronically Signed   By: Marijo Conception M.D.   On: 03/30/2020 13:26   DG Chest Port 1 View  Result Date: 03/29/2020 CLINICAL DATA:  Respiratory failure. EXAM: PORTABLE CHEST 1 VIEW COMPARISON:  03/27/2020. FINDINGS: 0457 hours. Endotracheal tube tip is 2.5 cm above the base of the carina. The NG tube passes into the stomach although the distal tip position is not included on the film. Right PICC line tip overlies the mid SVC level. The cardiopericardial silhouette is within normal limits for size. Similar appearance diffuse bilateral airspace disease. Telemetry leads overlie the chest. IMPRESSION: Similar appearance diffuse bilateral airspace disease given differing technique/position. Electronically Signed   By: Misty Stanley M.D.   On: 03/29/2020 07:42   DG Chest Port 1 View  Result Date: 03/27/2020 CLINICAL DATA:  ET tube placement EXAM: PORTABLE CHEST 1 VIEW COMPARISON:  03/26/2020 FINDINGS: Endotracheal tube is 2 cm above the carina. OG tube is in the stomach. Right PICC line remains in place, unchanged. Bilateral airspace disease. Heart is normal size. No effusions or acute bony abnormality. IMPRESSION: Support devices as above. Bilateral airspace disease, slightly worsened since prior study. Electronically Signed   By: Rolm Baptise M.D.   On: 03/27/2020 02:42   DG Chest Port 1 View  Result Date: 03/26/2020 CLINICAL DATA:  E tube placement EXAM: PORTABLE CHEST 1 VIEW COMPARISON:  03/26/2020 FINDINGS: Endotracheal tube is 4 cm above the carina. Right PICC line is in the SVC. Diffuse interstitial opacities throughout the lungs. Patchy airspace  opacities. Findings similar to prior study. Heart is normal size. No effusions or pneumothorax. IMPRESSION: Endotracheal tube 4 cm above the carina. Stable diffuse interstitial and patchy bilateral airspace opacities. Electronically Signed   By: Rolm Baptise M.D.   On: 03/26/2020 20:58   DG CHEST PORT 1 VIEW  Result Date: 03/26/2020 CLINICAL DATA:  Post intubation, history of COVID. EXAM: PORTABLE CHEST 1 VIEW COMPARISON:  March 25, 2020 FINDINGS: RIGHT-sided PICC line in the area of the caval to atrial junction. Partially obscured by lead projecting just  to the RIGHT of the spine in this location. Interval placement of an endotracheal tube which is approximately 6.8 cm above the carina. Above clavicular heads. Cardiomediastinal contours are stable with diffuse interstitial and airspace opacities in the chest in this patient with history of COVID-19 infection. Marked gaseous distension is noted in the LEFT upper quadrant presumably in the stomach. On limited assessment no acute skeletal process. IMPRESSION: 1. Interval intubation with endotracheal tube approximately 6.8 cm above the carina. Projects above clavicular heads. Could consider slight advancement approximately 2 cm for more optimal placement. 2. Marked gaseous distension of the stomach. Dedicated abdominal radiograph could be helpful to determine whether gastric tube placement may be of benefit. 3. Stable diffuse interstitial and airspace opacities in the chest in this patient with history of COVID-19 infection. These results will be called to the ordering clinician or representative by the Radiologist Assistant, and communication documented in the PACS or [employer]. Electronically Signed   By: Zetta Bills M.D.   On: 03/26/2020 14:08   DG Chest Port 1 View  Result Date: 03/25/2020 CLINICAL DATA:  COVID pneumonia, shortness of breath, hypertension, coronary artery disease post MI and coronary stenting EXAM: PORTABLE CHEST 1 VIEW  COMPARISON:  Portable exam 0629 hours compared to 03/20/2020 FINDINGS: Normal heart size, mediastinal contours, and pulmonary vascularity. RIGHT arm PICC line tip projects over cavoatrial junction. Diffuse BILATERAL pulmonary infiltrates consistent with multifocal pneumonia and COVID-19, little changed. Azygos fissure noted. No pleural effusion or pneumothorax. IMPRESSION: Persistent pulmonary infiltrates consistent with multifocal pneumonia and COVID-19. Electronically Signed   By: Lavonia Dana M.D.   On: 03/25/2020 08:20   DG Abd Portable 1V  Result Date: 04/14/2020 CLINICAL DATA:  Ileus.  NG tube. EXAM: PORTABLE ABDOMEN - 1 VIEW COMPARISON:  04/08/2020 FINDINGS: NG tube in the body the stomach unchanged. Gas in mildly distended colon. Mildly distended small bowel loop in the left abdomen. Surgical clips right upper quadrant.  Bibasilar airspace disease. IMPRESSION: Mild bowel distension suggestive of ileus.  NG tube in the stomach. Electronically Signed   By: Franchot Gallo M.D.   On: 04/14/2020 10:34   DG Abd Portable 1V  Result Date: 04/06/2020 EXAM: PORTABLE ABDOMEN - 1 VIEW COMPARISON:  04/05/2020.  CT 03/14/2020. FINDINGS: Surgical clips right upper quadrant. NG tube noted over the stomach. Prior colectomy. Persistent prominently dilated loops of small bowel again noted. Again small-bowel obstruction cannot be excluded. Degenerative change lumbar spine. IMPRESSION: 1.  NG tube noted over the stomach. 2. Prior colectomy. Persistent prominently dilated loops of small bowel. Again small-bowel obstruction cannot be excluded. Electronically Signed   By: Marcello Moores  Register   On: 04/06/2020 06:50   DG Abd Portable 1V  Result Date: 04/05/2020 CLINICAL DATA:  Abdominal distention. EXAM: PORTABLE ABDOMEN - 1 VIEW COMPARISON:  Abdomen 03/27/2020.  CT 03/06/2020. FINDINGS: NG tube noted with tip over the stomach. Multiple prominent dilated loops of small bowel noted. Small bowel dilatation up to 6 cm noted.  Prior colectomy. Findings consistent with small bowel obstruction. Follow-up exams to demonstrate resolution suggested. Hemidiaphragms not imaged. No free air is identified. No pneumatosis noted. Degenerative change lumbar spine and both hips. IMPRESSION: 1.  NG tube noted with tip over stomach. 2. Findings suggestive of small bowel obstruction. Follow-up exam suggested to demonstrate clearing. No free air identified. No pneumatosis noted. 3.  Prior colectomy. Electronically Signed   By: Marcello Moores  Register   On: 04/05/2020 09:59   ECHOCARDIOGRAM COMPLETE  Result Date: 03/24/2020  ECHOCARDIOGRAM REPORT   Patient Name:   Calvin Cruz Date of Exam: 03/24/2020 Medical Rec #:  [id number]       Height:       78.0 in Accession #:    [id number]      Weight:       196.9 lb Date of Birth:  08/15/1960      BSA:          2.242 m Patient Age:    59 years        BP:           130/68 mmHg Patient Gender: M               HR:           95 bpm. Exam Location:  [facility] Procedure: 2D Echo Indications:    Ventricular Tachycardia I47.2  History:        Patient has prior history of Echocardiogram examinations, most                 recent 07/22/2014. CAD and Previous Myocardial Infarction; Risk                 Factors:Dyslipidemia, Hypertension and Former Smoker. Covid 19.  Sonographer:    Leavy Cella RDCS (AE) Referring Phys: [place]  1. Left ventricular ejection fraction, by estimation, is 55 to 60%. The left ventricle has normal function. The left ventricle has no regional wall motion abnormalities. There is moderate left ventricular hypertrophy. Left ventricular diastolic parameters are indeterminate.  2. Right ventricular systolic function is low normal. The right ventricular size is normal. Tricuspid regurgitation signal is inadequate for assessing PA pressure.  3. The mitral valve is grossly normal. Trivial mitral valve regurgitation.  4. The aortic valve is tricuspid. There is mild calcification of  the aortic valve. Aortic valve regurgitation is not visualized.  5. The inferior vena cava is normal in size with greater than 50% respiratory variability, suggesting right atrial pressure of 3 mmHg. FINDINGS  Left Ventricle: Left ventricular ejection fraction, by estimation, is 55 to 60%. The left ventricle has normal function. The left ventricle has no regional wall motion abnormalities. The left ventricular internal cavity size was normal in size. There is  moderate left ventricular hypertrophy. Left ventricular diastolic parameters are indeterminate. Right Ventricle: The right ventricular size is normal. No increase in right ventricular wall thickness. Right ventricular systolic function is low normal. Tricuspid regurgitation signal is inadequate for assessing PA pressure. Left Atrium: Left atrial size was normal in size. Right Atrium: Right atrial size was normal in size. Pericardium: There is no evidence of pericardial effusion. Mitral Valve: The mitral valve is grossly normal. There is mild thickening of the mitral valve leaflet(s). Mild mitral annular calcification. Trivial mitral valve regurgitation. Tricuspid Valve: The tricuspid valve is grossly normal. Tricuspid valve regurgitation is trivial. Aortic Valve: The aortic valve is tricuspid. There is mild calcification of the aortic valve. There is mild to moderate aortic valve annular calcification. Aortic valve regurgitation is not visualized. Pulmonic Valve: The pulmonic valve was grossly normal. Pulmonic valve regurgitation is trivial. Aorta: The aortic root is normal in size and structure. Venous: The inferior vena cava is normal in size with greater than 50% respiratory variability, suggesting right atrial pressure of 3 mmHg. IAS/Shunts: No atrial level shunt detected by color flow Doppler.  LEFT VENTRICLE PLAX 2D LVIDd:         3.23 cm  Diastology LVIDs:  2.19 cm  LV e' medial:    5.22 cm/s LV PW:         1.89 cm  LV E/e' medial:  13.9 LV IVS:         1.20 cm  LV e' lateral:   11.90 cm/s LVOT diam:     1.90 cm  LV E/e' lateral: 6.1 LVOT Area:     2.84 cm  RIGHT VENTRICLE RV S prime:     21.30 cm/s TAPSE (M-mode): 2.1 cm LEFT ATRIUM           Index      RIGHT ATRIUM          Index LA diam:      3.30 cm 1.47 cm/m RA Area:     8.48 cm LA Vol (A2C): 17.1 ml 7.63 ml/m RA Volume:   16.90 ml 7.54 ml/m LA Vol (A4C): 21.3 ml 9.50 ml/m   AORTA Ao Root diam: 2.90 cm MITRAL VALVE MV Area (PHT): 3.06 cm    SHUNTS MV Decel Time: 248 msec    Systemic Diam: 1.90 cm MV E velocity: 72.70 cm/s MV A velocity: 74.90 cm/s MV E/A ratio:  0.97 Rozann Lesches MD Electronically signed by Rozann Lesches MD Signature Date/Time: 03/24/2020/11:43:16 AM    Final    ECHOCARDIOGRAM LIMITED  Result Date: 04/13/2020    ECHOCARDIOGRAM LIMITED REPORT   Patient Name:   Calvin Cruz Date of Exam: 04/13/2020 Medical Rec #:  [id number]       Height:       78.0 in Accession #:    [id number]      Weight:       211.2 lb Date of Birth:  11/07/1960      BSA:          2.310 m Patient Age:    53 years        BP:           148/57 mmHg Patient Gender: M               HR:           121 bpm. Exam Location:  Inpatient Procedure: Limited Echo, Cardiac Doppler and Color Doppler Indications:    Bacteremia  History:        Patient has prior history of Echocardiogram examinations, most                 recent 04/06/2020. CAD, Abnormal ECG, Arrythmias:Atrial                 Fibrillation, Signs/Symptoms:Shortness of Breath and Dyspnea;                 Risk Factors:Hypertension and Dyslipidemia. Covid infection                 1/22.  Sonographer:    Roseanna Rainbow RDCS Referring Phys: [id number] [facility]  Sonographer Comments: Technically difficult study due to poor echo windows and echo performed with patient supine and on artificial respirator. Supine, attempted to turn. Extremely difficult study. IMPRESSIONS  1. Left ventricular ejection fraction, by estimation, is 60 to 65%. Left ventricular ejection  fraction by PLAX is 64 %. The left ventricle has normal function. There is mild concentric left ventricular hypertrophy.  2. The mitral valve is normal in structure. Trivial mitral valve regurgitation.  3. The aortic valve is tricuspid. Mild aortic valve sclerosis is present, with no evidence of aortic valve stenosis.  4. There is mild  dilatation of the aortic root, measuring 39 mm. FINDINGS  Left Ventricle: Left ventricular ejection fraction, by estimation, is 60 to 65%. Left ventricular ejection fraction by PLAX is 64 %. The left ventricle has normal function. There is mild concentric left ventricular hypertrophy. Mitral Valve: The mitral valve is normal in structure. Mild mitral annular calcification. Trivial mitral valve regurgitation. Tricuspid Valve: Tricuspid valve regurgitation is trivial. Aortic Valve: The aortic valve is tricuspid. Mild aortic valve sclerosis is present, with no evidence of aortic valve stenosis. Aorta: There is mild dilatation of the aortic root, measuring 39 mm. LEFT VENTRICLE PLAX 2D LV EF:         Left ventricular ejection fraction by PLAX is 64 %. LVIDd:         3.80 cm LVIDs:         2.50 cm LV PW:         2.50 cm LV IVS:        1.20 cm  LEFT ATRIUM         Index LA diam:    3.20 cm 1.39 cm/m   AORTA Ao Root diam: 3.90 cm Ao Asc diam:  3.60 cm Fransico Him MD Electronically signed by Fransico Him MD Signature Date/Time: 04/13/2020/11:54:36 AM    Final    ECHOCARDIOGRAM LIMITED  Result Date: 04/06/2020    ECHOCARDIOGRAM REPORT   Patient Name:   Calvin Cruz Date of Exam: 04/06/2020 Medical Rec #:  [id number]       Height:       78.0 in Accession #:    [id number]      Weight:       207.5 lb Date of Birth:  [birth year]      BSA:          2.293 m Patient Age:    1 years        BP:           98/53 mmHg Patient Gender: M               HR:           80 bpm. Exam Location:  Inpatient Procedure: Limited Echo, Limited Color Doppler and Color Doppler Indications:    Abnormal ECG R94.31   History:        Patient has prior history of Echocardiogram examinations, most                 recent 03/24/2020. CAD and Previous Myocardial Infarction; Risk                 Factors:Hypertension and Diabetes.  Sonographer:    Mikki Santee RDCS (AE) Referring Phys: [place]  1. Left ventricular ejection fraction, by estimation, is 50 to 55%. The left ventricle has low normal function. Left ventricular endocardial border not optimally defined to evaluate regional wall motion. Left ventricular diastolic parameters were normal.  2. Right ventricular systolic function is normal. The right ventricular size is normal. Tricuspid regurgitation signal is inadequate for assessing PA pressure.  3. The mitral valve is normal in structure. No evidence of mitral valve regurgitation.  4. The aortic valve is tricuspid. Aortic valve regurgitation is trivial. Mild aortic valve stenosis.  5. The inferior vena cava is normal in size with greater than 50% respiratory variability, suggesting right atrial pressure of 3 mmHg. FINDINGS  Left Ventricle: Left ventricular ejection fraction, by estimation, is 50 to 55%. The left ventricle has low normal function. Left ventricular  endocardial border not optimally defined to evaluate regional wall motion. The left ventricular internal cavity  size was normal in size. There is no left ventricular hypertrophy. Left ventricular diastolic parameters were normal. Right Ventricle: The right ventricular size is normal. Right vetricular wall thickness was not well visualized. Right ventricular systolic function is normal. Tricuspid regurgitation signal is inadequate for assessing PA pressure. Left Atrium: Left atrial size was not well visualized. Right Atrium: Right atrial size was not well visualized. Pericardium: There is no evidence of pericardial effusion. Mitral Valve: The mitral valve is normal in structure. No evidence of mitral valve regurgitation. Tricuspid Valve: The  tricuspid valve is not well visualized. Tricuspid valve regurgitation is trivial. Aortic Valve: The aortic valve is tricuspid. Aortic valve regurgitation is trivial. Mild aortic stenosis is present. Aortic valve mean gradient measures 9.0 mmHg. Aortic valve peak gradient measures 18.1 mmHg. Pulmonic Valve: The pulmonic valve was not well visualized. Pulmonic valve regurgitation is not visualized. Aorta: The aortic root is normal in size and structure. Venous: The inferior vena cava is normal in size with greater than 50% respiratory variability, suggesting right atrial pressure of 3 mmHg. IAS/Shunts: The interatrial septum was not well visualized.   Diastology LV e' medial:    9.57 cm/s LV E/e' medial:  9.8 LV e' lateral:   14.10 cm/s LV E/e' lateral: 6.6  RIGHT ATRIUM           Index RA Area:     15.70 cm RA Volume:   40.30 ml  17.58 ml/m  AORTIC VALVE AV Vmax:           213.00 cm/s AV Vmean:          135.000 cm/s AV VTI:            0.390 m AV Peak Grad:      18.1 mmHg AV Mean Grad:      9.0 mmHg LVOT Vmax:         113.00 cm/s LVOT Vmean:        73.100 cm/s LVOT VTI:          0.192 m LVOT/AV VTI ratio: 0.49 MITRAL VALVE MV Area (PHT): 2.95 cm    SHUNTS MV Decel Time: 257 msec    Systemic VTI: 0.19 m MV E velocity: 93.50 cm/s MV A velocity: 67.80 cm/s MV E/A ratio:  1.38 Oswaldo Milian MD Electronically signed by Oswaldo Milian MD Signature Date/Time: 04/06/2020/11:01:47 PM    Final    [place] EKG SITE RITE  Result Date: 03/22/2020 If Site Rite image not attached, placement could not be confirmed due to current cardiac rhythm.   Microbiology Recent Results (from the past 240 hour(s))  Culture, Urine     Status: None   Collection Time: 04/11/20  8:57 AM   Specimen: Urine, Clean Catch  Result Value Ref Range Status   Specimen Description   Final    URINE, CLEAN CATCH Performed at [facility], [place] [address], [place], [place] [address]    Special Requests   Final     NONE Performed at [facility], [place] [address] [place], [place] [address]    Culture   Final    NO GROWTH Performed at [facility], [place] [address], [place], [place] [address]    Report Status 04/12/2020 FINAL  Final  Culture, blood (Routine X 2) w Reflex to ID Panel     Status: Abnormal   Collection Time: 04/11/20 11:58 AM  Specimen: BLOOD RIGHT HAND  Result Value Ref Range Status   Specimen Description   Final    BLOOD RIGHT HAND Performed at [facility] [address], [place], [place] [address]    Special Requests   Final    BOTTLES DRAWN AEROBIC AND ANAEROBIC Blood Culture results may not be optimal due to an inadequate volume of blood received in culture bottles Performed at [facility] [address] [place], [place] [address]    Culture  Setup Time   Final    GRAM POSITIVE COCCI IN CLUSTERS IN BOTH AEROBIC AND ANAEROBIC BOTTLES CRITICAL RESULT CALLED TO, READ BACK BY AND VERIFIED WITH: T,GREEN PHARMD @1223  04/12/20 EB Performed at [facility], [place] [address] [place], [place] [address]    Culture STAPHYLOCOCCUS EPIDERMIDIS (A)  Final   Report Status 04/14/2020 FINAL  Final   Organism ID, Bacteria STAPHYLOCOCCUS EPIDERMIDIS  Final      Susceptibility   Staphylococcus epidermidis - MIC*    CIPROFLOXACIN >=8 RESISTANT Resistant     ERYTHROMYCIN >=8 RESISTANT Resistant     GENTAMICIN 8 INTERMEDIATE Intermediate     OXACILLIN >=4 RESISTANT Resistant     TETRACYCLINE 2 SENSITIVE Sensitive     VANCOMYCIN 1 SENSITIVE Sensitive     TRIMETH/SULFA 80 RESISTANT Resistant     CLINDAMYCIN >=8 RESISTANT Resistant     RIFAMPIN <=0.5 SENSITIVE Sensitive     Inducible Clindamycin NEGATIVE Sensitive     * STAPHYLOCOCCUS EPIDERMIDIS  Blood Culture ID Panel (Reflexed)     Status: Abnormal   Collection Time: 04/11/20 11:58 AM  Result Value Ref Range Status   Enterococcus faecalis NOT DETECTED NOT DETECTED  Final   Enterococcus Faecium NOT DETECTED NOT DETECTED Final   Listeria monocytogenes NOT DETECTED NOT DETECTED Final   Staphylococcus species DETECTED (A) NOT DETECTED Final    Comment: CRITICAL RESULT CALLED TO, READ BACK BY AND VERIFIED WITH: T,GREEN PHARMD @1223  04/12/20 EB    Staphylococcus aureus (BCID) NOT DETECTED NOT DETECTED Final   Staphylococcus epidermidis DETECTED (A) NOT DETECTED Final    Comment: Methicillin (oxacillin) resistant coagulase negative staphylococcus. Possible blood culture contaminant (unless isolated from more than one blood culture draw or clinical case suggests pathogenicity). No antibiotic treatment is indicated for blood  culture contaminants. CRITICAL RESULT CALLED TO, READ BACK BY AND VERIFIED WITH: T,GREEN PHARMD @1223  04/12/20 EB    Staphylococcus lugdunensis NOT DETECTED NOT DETECTED Final   Streptococcus species NOT DETECTED NOT DETECTED Final   Streptococcus agalactiae NOT DETECTED NOT DETECTED Final   Streptococcus pneumoniae NOT DETECTED NOT DETECTED Final   Streptococcus pyogenes NOT DETECTED NOT DETECTED Final   A.calcoaceticus-baumannii NOT DETECTED NOT DETECTED Final   Bacteroides fragilis NOT DETECTED NOT DETECTED Final   Enterobacterales NOT DETECTED NOT DETECTED Final   Enterobacter cloacae complex NOT DETECTED NOT DETECTED Final   Escherichia coli NOT DETECTED NOT DETECTED Final   Klebsiella aerogenes NOT DETECTED NOT DETECTED Final   Klebsiella oxytoca NOT DETECTED NOT DETECTED Final   Klebsiella pneumoniae NOT DETECTED NOT DETECTED Final   Proteus species NOT DETECTED NOT DETECTED Final   Salmonella species NOT DETECTED NOT DETECTED Final   Serratia marcescens NOT DETECTED NOT DETECTED Final   Haemophilus influenzae NOT DETECTED NOT DETECTED Final   Neisseria meningitidis NOT DETECTED NOT DETECTED Final   Pseudomonas aeruginosa NOT DETECTED NOT DETECTED Final   Stenotrophomonas maltophilia NOT DETECTED NOT DETECTED Final   Candida  albicans NOT DETECTED  NOT DETECTED Final   Candida auris NOT DETECTED NOT DETECTED Final   Candida glabrata NOT DETECTED NOT DETECTED Final   Candida krusei NOT DETECTED NOT DETECTED Final   Candida parapsilosis NOT DETECTED NOT DETECTED Final   Candida tropicalis NOT DETECTED NOT DETECTED Final   Cryptococcus neoformans/gattii NOT DETECTED NOT DETECTED Final   Methicillin resistance mecA/C DETECTED (A) NOT DETECTED Final    Comment: CRITICAL RESULT CALLED TO, READ BACK BY AND VERIFIED WITH: T,GREEN PHARMD @1223  04/12/20 EB Performed at [facility] [address] [place], [place] [address]   Culture, Respiratory w Gram Stain     Status: None   Collection Time: 04/11/20 12:00 PM   Specimen: Tracheal Aspirate; Respiratory  Result Value Ref Range Status   Specimen Description   Final    TRACHEAL ASPIRATE Performed at [facility] [address], [place], [place] [address]    Special Requests   Final    NONE Performed at [facility], [place] [address] [place], [place] [address]    Gram Stain   Final    ABUNDANT WBC PRESENT, PREDOMINANTLY PMN MODERATE GRAM NEGATIVE RODS RARE GRAM POSITIVE COCCI RARE GRAM POSITIVE RODS Performed at [facility], [place] [address] [place], [place] [address]    Culture MODERATE ESCHERICHIA COLI  Final   Report Status 04/13/2020 FINAL  Final   Organism ID, Bacteria ESCHERICHIA COLI  Final      Susceptibility   Escherichia coli - MIC*    AMPICILLIN 4 SENSITIVE Sensitive     CEFAZOLIN <=4 SENSITIVE Sensitive     CEFEPIME <=0.12 SENSITIVE Sensitive     CEFTAZIDIME <=1 SENSITIVE Sensitive     CEFTRIAXONE <=0.25 SENSITIVE Sensitive     CIPROFLOXACIN <=0.25 SENSITIVE Sensitive     GENTAMICIN <=1 SENSITIVE Sensitive     IMIPENEM <=0.25 SENSITIVE Sensitive     TRIMETH/SULFA <=20 SENSITIVE Sensitive     AMPICILLIN/SULBACTAM <=2 SENSITIVE Sensitive     PIP/TAZO <=4 SENSITIVE Sensitive     * MODERATE  ESCHERICHIA COLI  Culture, blood (Routine X 2) w Reflex to ID Panel     Status: Abnormal   Collection Time: 04/11/20 12:59 PM   Specimen: BLOOD LEFT HAND  Result Value Ref Range Status   Specimen Description   Final    BLOOD LEFT HAND Performed at [facility] [address], [place], [place] [address]    Special Requests   Final    BOTTLES DRAWN AEROBIC ONLY Blood Culture adequate volume Performed at [facility] [address] [place], [place] [address]    Culture  Setup Time   Final    GRAM POSITIVE COCCI IN CLUSTERS AEROBIC BOTTLE ONLY CRITICAL VALUE NOTED.  VALUE IS CONSISTENT WITH PREVIOUSLY REPORTED AND CALLED VALUE.    Culture (A)  Final    STAPHYLOCOCCUS EPIDERMIDIS SUSCEPTIBILITIES PERFORMED ON PREVIOUS CULTURE WITHIN THE LAST 5 DAYS. Performed at [facility], [place] [address], [place], [place] [address]    Report Status 04/14/2020 FINAL  Final  Culture, blood (Routine X 2) w Reflex to ID Panel     Status: None (Preliminary result)   Collection Time: 04/13/20  3:16 AM   Specimen: BLOOD  Result Value Ref Range Status   Specimen Description   Final    BLOOD LEFT HAND Performed at [facility] [address] [place],  [address]    Special Requests   Final  BOTTLES DRAWN AEROBIC AND ANAEROBIC Blood Culture adequate volume Performed at [facility] [address] [place], [place] [address]    Culture   Final    NO GROWTH 1 DAY Performed at [facility], [place] [address], [place], [place] [address]    Report Status PENDING  Incomplete  Culture, blood (Routine X 2) w Reflex to ID Panel     Status: None (Preliminary result)   Collection Time: 04/13/20  3:16 AM   Specimen: BLOOD  Result Value Ref Range Status   Specimen Description   Final    BLOOD LEFT HAND Performed at [facility] [address] [place], [place] [address]    Special Requests    Final    BOTTLES DRAWN AEROBIC AND ANAEROBIC Blood Culture results may not be optimal due to an inadequate volume of blood received in culture bottles Performed at [facility] [address], [place], [place] [address]    Culture   Final    NO GROWTH 1 DAY Performed at [facility], [place] [address] [place], [place] [address]    Report Status PENDING  Incomplete    Lab Basic Metabolic Panel: Recent Labs  Lab 04/08/20 1750 04/09/20 0524 04/09/20 2200 04/10/20 0428 04/11/20 0145 04/11/20 1915 04/12/20 0447 04/12/20 1633 04/13/20 0428 04/13/20 1706 04/13/20 2139 04/14/20 0522  NA  --    < >  --    < > 138   < > 144 143 141 142  --  142  K  --    < >  --    < > 3.4*   < > 3.7 4.0 4.1 3.8  --  4.0  CL  --    < >  --    < > 87*   < > 94* 93* 92* 94*  --  95*  CO2  --    < >  --    < > 38*   < > 43* 41* 40* 38*  --  40*  GLUCOSE  --    < >  --    < > 224*   < > 137* 301* 310* 266*  --  271*  BUN  --    < >  --    < > 28*   < > 30* 33* 42* 41*  --  43*  CREATININE  --    < >  --    < > 0.56*   < > 0.31* 0.53* 0.54* 0.52*  --  0.54*  CALCIUM  --    < >  --    < > 7.6*   < > 8.1* 7.3* 7.7* 7.4*  --  7.4*  MG 2.2  --  1.9  --  1.7  --  2.2  --  2.3  --  2.6*  --   PHOS 2.3*  --   --   --   --   --  1.7*  --  2.4*  --   --   --    < > = values in this interval not displayed.   Liver Function Tests: Recent Labs  Lab 04/09/20 0524 04/14/20 0522  AST 25 38  ALT 33 44  ALKPHOS 54 51  BILITOT 0.7 0.7  PROT 5.7* 5.1*  ALBUMIN 2.0* 1.8*   No results for input(s): LIPASE, AMYLASE in the last 168 hours. No results for input(s): AMMONIA in the last 168 hours. CBC:  Recent Labs  Lab 04/10/20 0305 04/11/20 0446 04/12/20 0447 04/13/20 0428 04/14/20 0522  WBC 7.5 19.6* 16.5* 19.4* 18.7*  HGB 11.0* 11.7* 10.2* 9.1* 8.7*  HCT 33.6* 36.2* 32.6* 28.7* 28.0*  MCV 94.9 97.1 100.9* 100.7* 101.1*  PLT 173 269 249 275 292   Cardiac Enzymes: No results for  input(s): CKTOTAL, CKMB, CKMBINDEX, TROPONINI in the last 168 hours. Sepsis Labs: Recent Labs  Lab 04/08/20 1405 04/09/20 0524 04/11/20 0446 04/12/20 0447 04/13/20 0428 04/14/20 0522  WBC  --    < > 19.6* 16.5* 19.4* 18.7*  LATICACIDVEN 2.2*  --   --   --   --   --    < > = values in this interval not displayed.    Procedures/Operations  Endotracheal intubation cardioversion   Ketara Cavness A Kaylla Cobos May 08, 2020, 9:30 AM

## 2020-04-19 NOTE — Progress Notes (Signed)
Assisted tele visit to patient with son.  Margaret Pyle, RN

## 2020-04-19 NOTE — Progress Notes (Signed)
eLink Physician-Brief Progress Note Patient Name: Calvin Cruz DOB: 09-23-1960 MRN: [id number]   Date of Service  05-14-20  HPI/Events of Note  Patient's wife, son and a third family member are in the room, I spoke to them via camera, I explained what comfort measures entailed, and the options that range from "no further escalation" to "compassionate extubation". They would like to discuss the options amongst themselves.  eICU Interventions  Communication with family.        Kerry Kass Shanavia Makela 05/14/2020, 2:45 AM

## 2020-04-19 NOTE — Progress Notes (Signed)
Extubated pt to room air.RN at bedside

## 2020-04-19 NOTE — Progress Notes (Signed)
Family wishing to terminally extubate, and has been wishing to decline care since they arrived at 0200.   E-link notified about families wishes.   Continuing to monitor.

## 2020-04-19 NOTE — Progress Notes (Signed)
[facility] Progress Note Patient Name: Calvin Cruz DOB: [birth year] MRN: [id number]   Date of Service  05-07-2020  HPI/Events of Note  Family leaning toward proceeding with transition to comfort measures  and possible terminal extubation. Their pastor is on the way.  eICU Interventions  Will defer to daytime PCCM attending physician regarding next steps.        Frederik Pear 2020-05-07, 6:49 AM

## 2020-04-19 NOTE — Progress Notes (Signed)
Chaplain was paged to offer support for family members who had just made the decision to move their husband/father to comfort care.  Chaplain engaged in ministry of presence with wife,  2 sons and other family members in the room.  Chaplain listened as wife Calvin Cruz spoke of Calvin Cruz as her best friend. Calvin Cruz spoke of how she had hoped her son who is in the [place] would make it home in time to see his dad one last time, but how it looks like he won't make it here in time.   Calvin Cruz described Calvin Cruz as God-fearing deeply devout Calvin Cruz and how they had raised their 3 sons and their 20-year old grandson to know the [place].  Son Calvin Cruz was wearing an [employer] hat:  [employer] in [place].    Calvin Cruz requested prayer.  Chaplain prayed at bedside.  Chaplain offered to maintain a quiet vigil with the family.  Wife explained their pastor was on the way in.  Chaplain offered additional consolation before departing.  Calvin Cruz

## 2020-04-19 NOTE — Progress Notes (Signed)
Upon hearing of Calvin Cruz's passing, Chaplain made a follow-up visit to wife and son who were at bedside.  Chaplain offered to read scripture and pray.  Wife accepted offer.  Chaplain read from [place], recited Psalm 23 and offered prayer of commendation.  [place]

## 2020-04-19 NOTE — Progress Notes (Addendum)
Continues to do poorly  Overnight events reviewed Labs reviewed   Patient was made DO NOT RESUSCITATE on 04/14/2020  I did speak with patients family at bedside  Questions regarding comfort measures were answered  We will continue fentanyl, add Versed drip  Compassionate withdrawal of supportive measures will be initiated  At present patient does not appear to be uncomfortable  We will continue to focus on his comfort  Orders placed

## 2020-04-19 NOTE — Progress Notes (Signed)
[facility] notified of TOD. Referral # [id number]. Potential for tissue donation. Mel Almond RN at bedside made aware.

## 2020-04-19 DEATH — deceased

## 2020-05-20 NOTE — Progress Notes (Signed)
Addendum for note on 2/24  Re:  Septic shock. 2/2 e coli PNA.  Initially thought that this was bacteremia & accordingly PICC removed as initially no other source identified but the insertion site of PICC was clean and w/out drainage or redness. He did have 2/2 staph epi. Not sure if this was a true infection or contamination as f/u cultures were negative. Seems more likely the E coli was larger contributing factor to his sepsis syndrome   Erick Colace ACNP-BC [place] Pager # [phone] OR # [phone] if no answer

## 2022-09-25 IMAGING — CT CT ABD-PELV W/ CM
2 of 5 series · 16 of 46 positions shown, 18 images · IV contrast (Omnipaque or Isovue)
Comparison: April 04, 2019

CLINICAL DATA: Abdominal pain

EXAM:
CT ABDOMEN AND PELVIS WITH CONTRAST
TECHNIQUE: Multidetector CT imaging of the abdomen and pelvis was performed
using the standard protocol following bolus administration of
intravenous contrast.
CONTRAST:  100mL OMNIPAQUE IOHEXOL 300 MG/ML  SOLN

[Series 2: axial st · axial · 0.89mm/px · z∈[+862,+1378]mm · 13 of 119 slices shown, 15 images]
[im 8/119  soft-tissue]
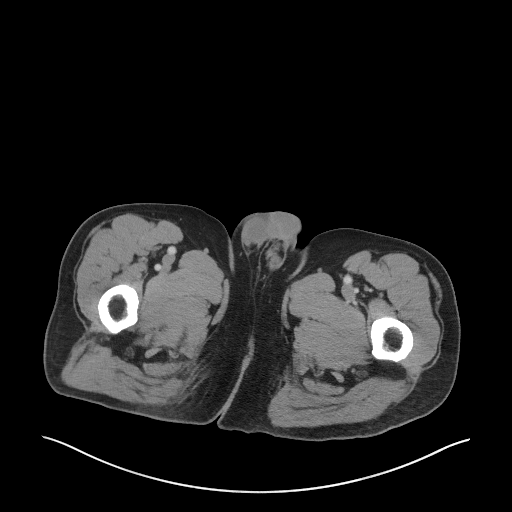
[im 8/119  bone]
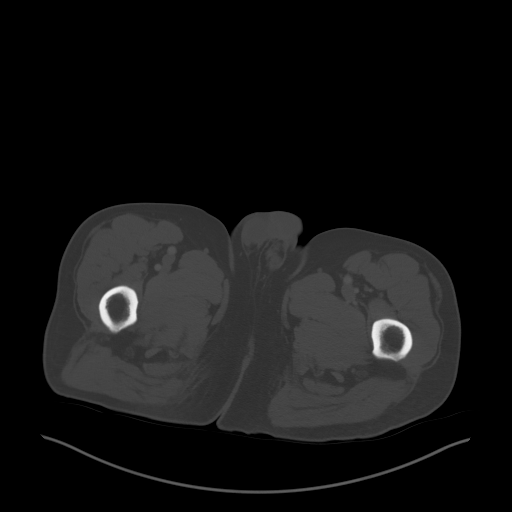
[im 15/119  soft-tissue]
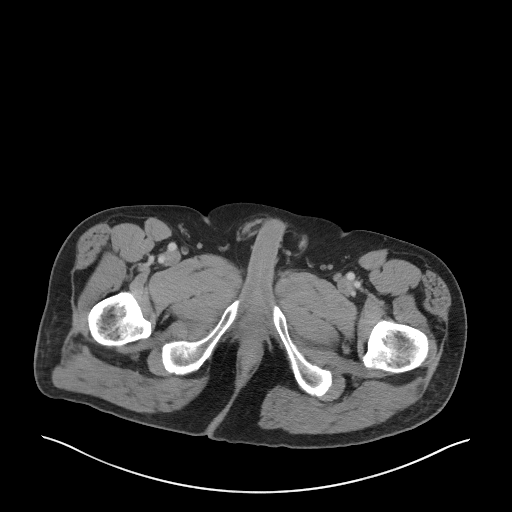
[im 23/119  soft-tissue]
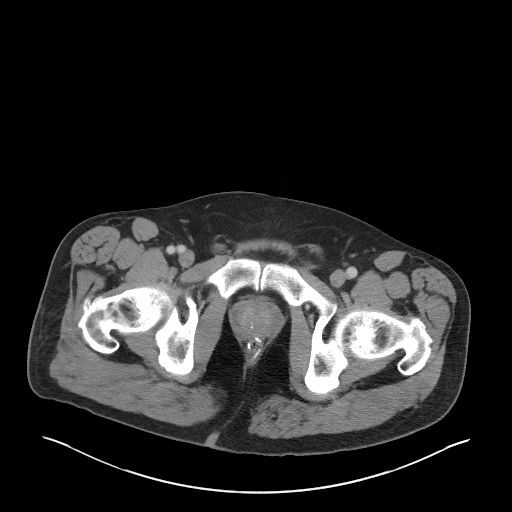
[im 37/119  soft-tissue]
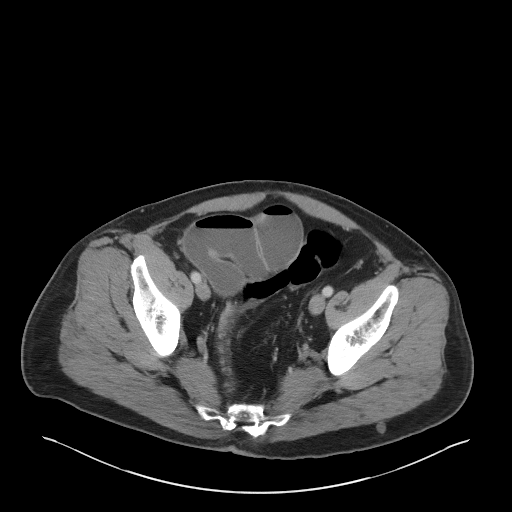
[im 45/119  soft-tissue]
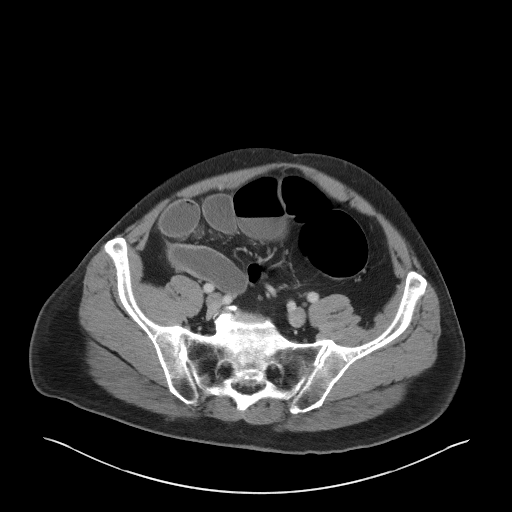
[im 52/119  soft-tissue]
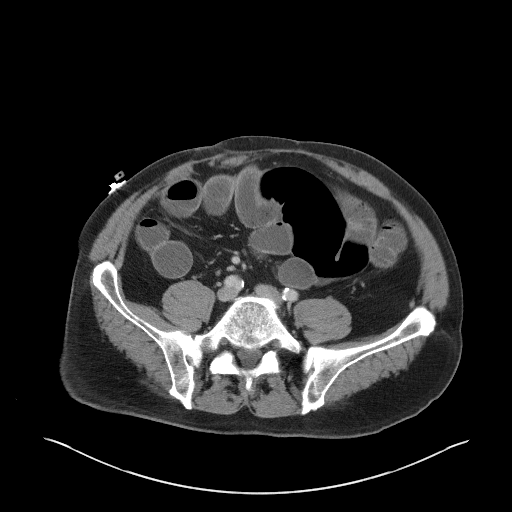
[im 60/119  soft-tissue]
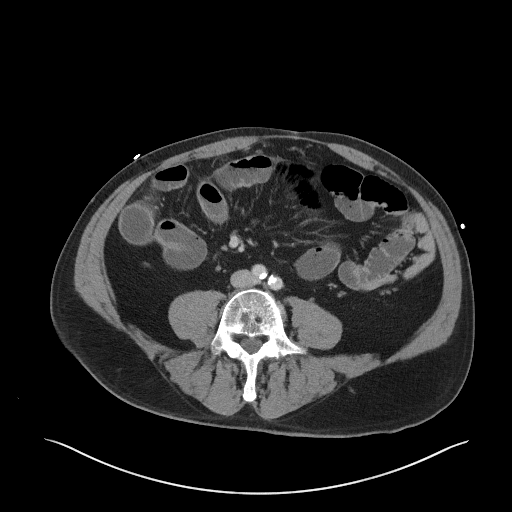
[im 67/119  soft-tissue]
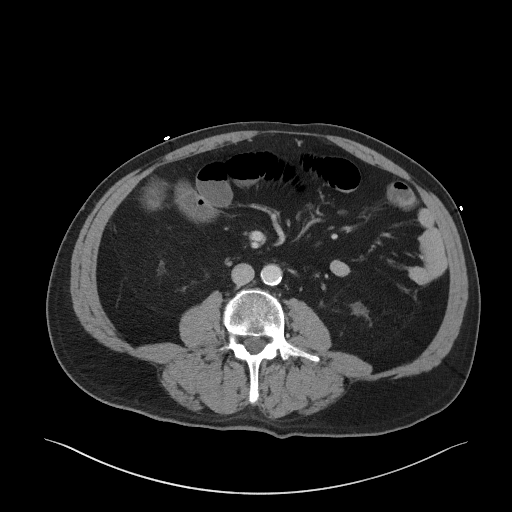
[im 74/119  soft-tissue]
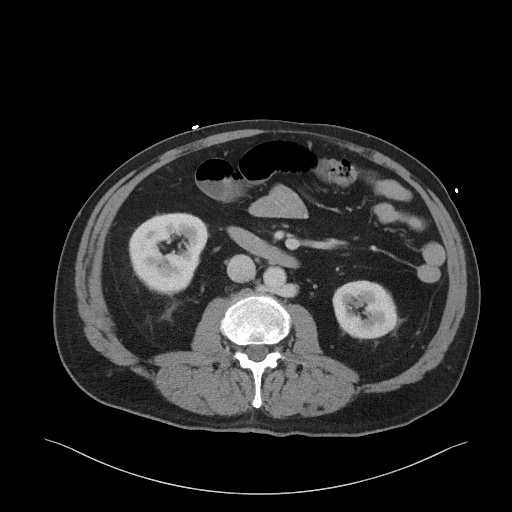
[im 74/119  bone]
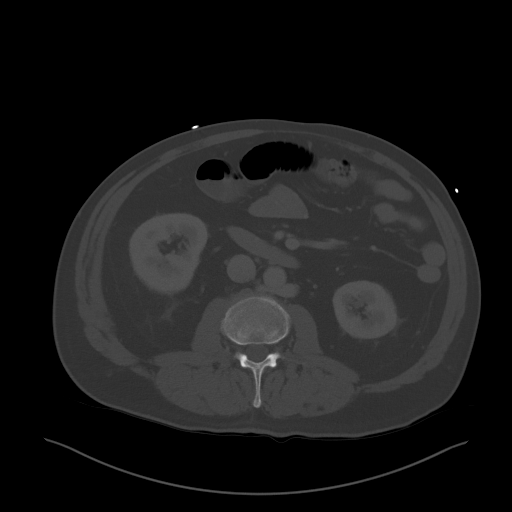
[im 82/119  soft-tissue]
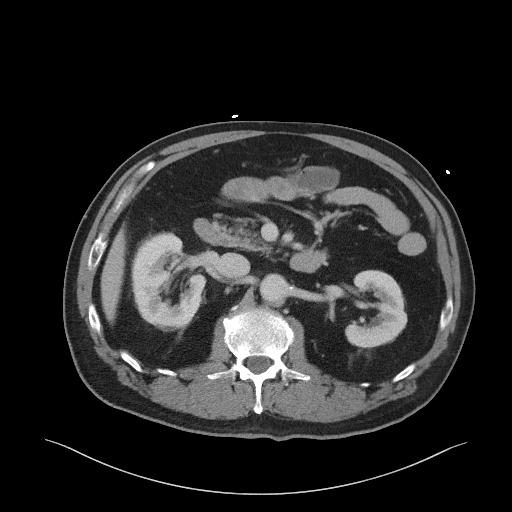
[im 96/119  soft-tissue]
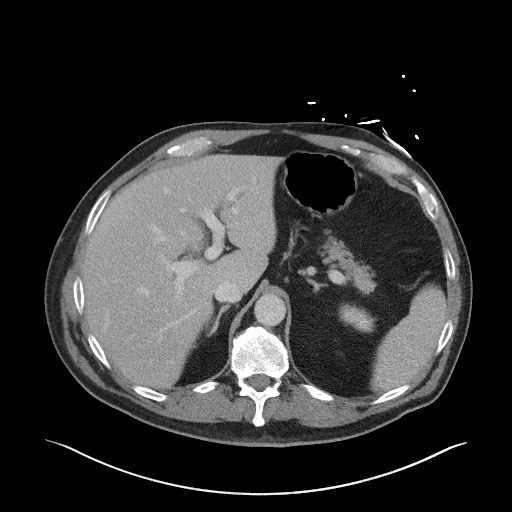
[im 104/119  soft-tissue]
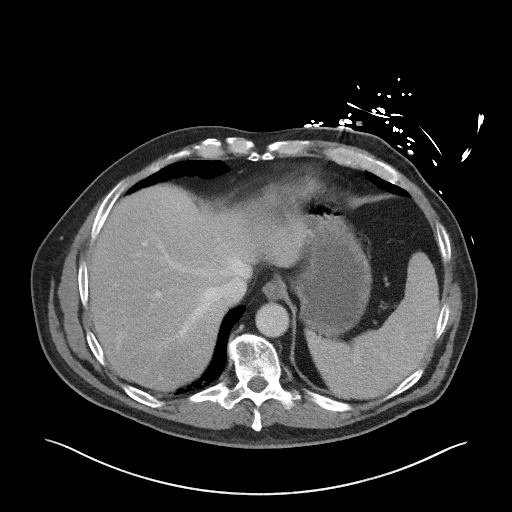
[im 111/119  soft-tissue]
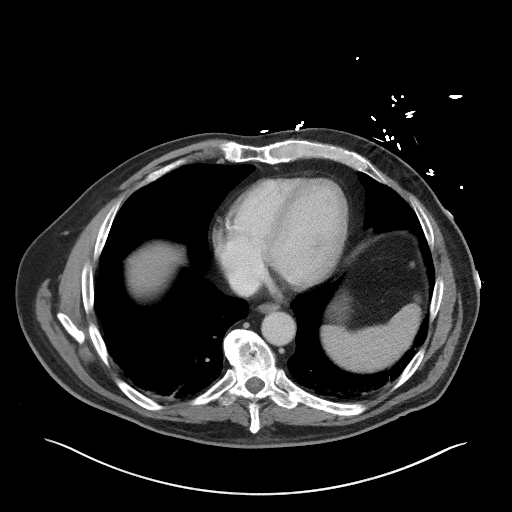

[Series 5: coronal st · coronal · 0.92mm/px · 3 of 109 slices shown]
[im 37/109  soft-tissue]
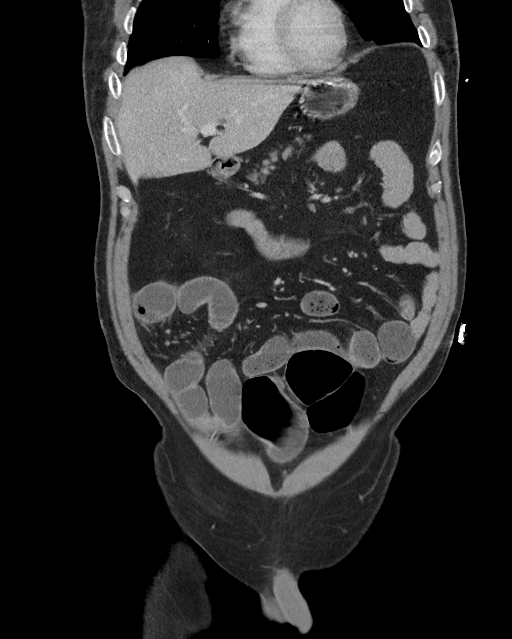
[im 49/109  soft-tissue]
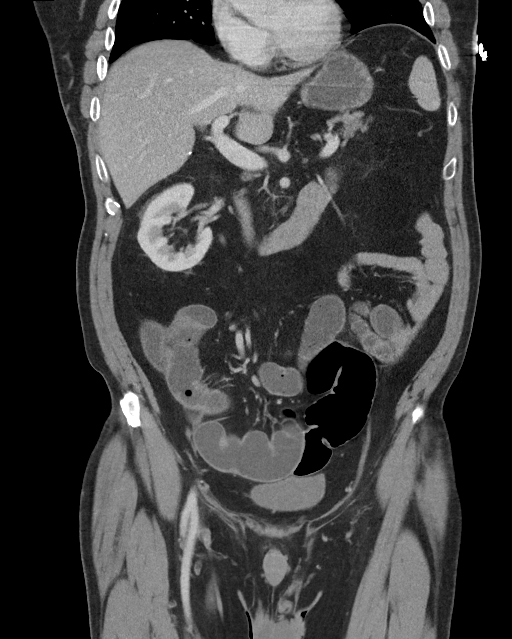
[im 61/109  soft-tissue]
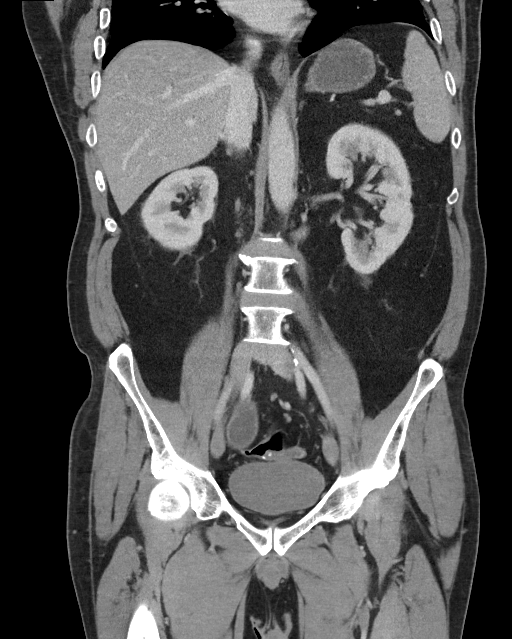

[16 of 46 positions shown; findings below may reference images not displayed]

FINDINGS: Lower chest: The visualized heart size within normal limits. No
pericardial fluid/thickening.

No hiatal hernia.

The visualized portions of the lungs are clear.

Hepatobiliary: The liver is normal in density without focal
abnormality.The main portal vein is patent. The patient is status
post cholecystectomy. No biliary ductal dilation.

Pancreas: Unremarkable. No pancreatic ductal dilatation or
surrounding inflammatory changes.

Spleen: Normal in size without focal abnormality.

Adrenals/Urinary Tract: Both adrenal glands appear normal. The
kidneys and collecting system appear normal without evidence of
urinary tract calculus or hydronephrosis. Bladder is unremarkable.

Stomach/Bowel: The stomach and proximal small bowel are
unremarkable. There are mildly dilated air and fluid-filled loops of
ileum measuring up to 3.2 cm. No clear transition point or focal
wall narrowing is noted. No surrounding inflammatory changes are
seen. There is been a prior colectomy with an ileocolonic
anastomosis within the deep pelvis.

Vascular/Lymphatic: There are no enlarged mesenteric,
retroperitoneal, or pelvic lymph nodes. Scattered aortic
atherosclerotic calcifications are seen without aneurysmal
dilatation.

Reproductive: The prostate is unremarkable.

Other: No evidence of abdominal wall mass or hernia.

Musculoskeletal: No acute or significant osseous findings.
IMPRESSION: Mildly dilated fluid and air-filled ileal loops without a clear
transition point. This is likely due to a partial small bowel
obstruction.

Prior colectomy with anastomosis within the deep pelvis.

Aortic Atherosclerosis ([id number]).

## 2022-09-30 IMAGING — DX DG ABDOMEN 2V
3 series · 3 of 3 positions shown · non-contrast
Comparison: 03/09/2020.  CT 03/06/2020.

CLINICAL DATA: Small-bowel obstruction.

EXAM:
ABDOMEN - 2 VIEW

[abdomen erect ap]
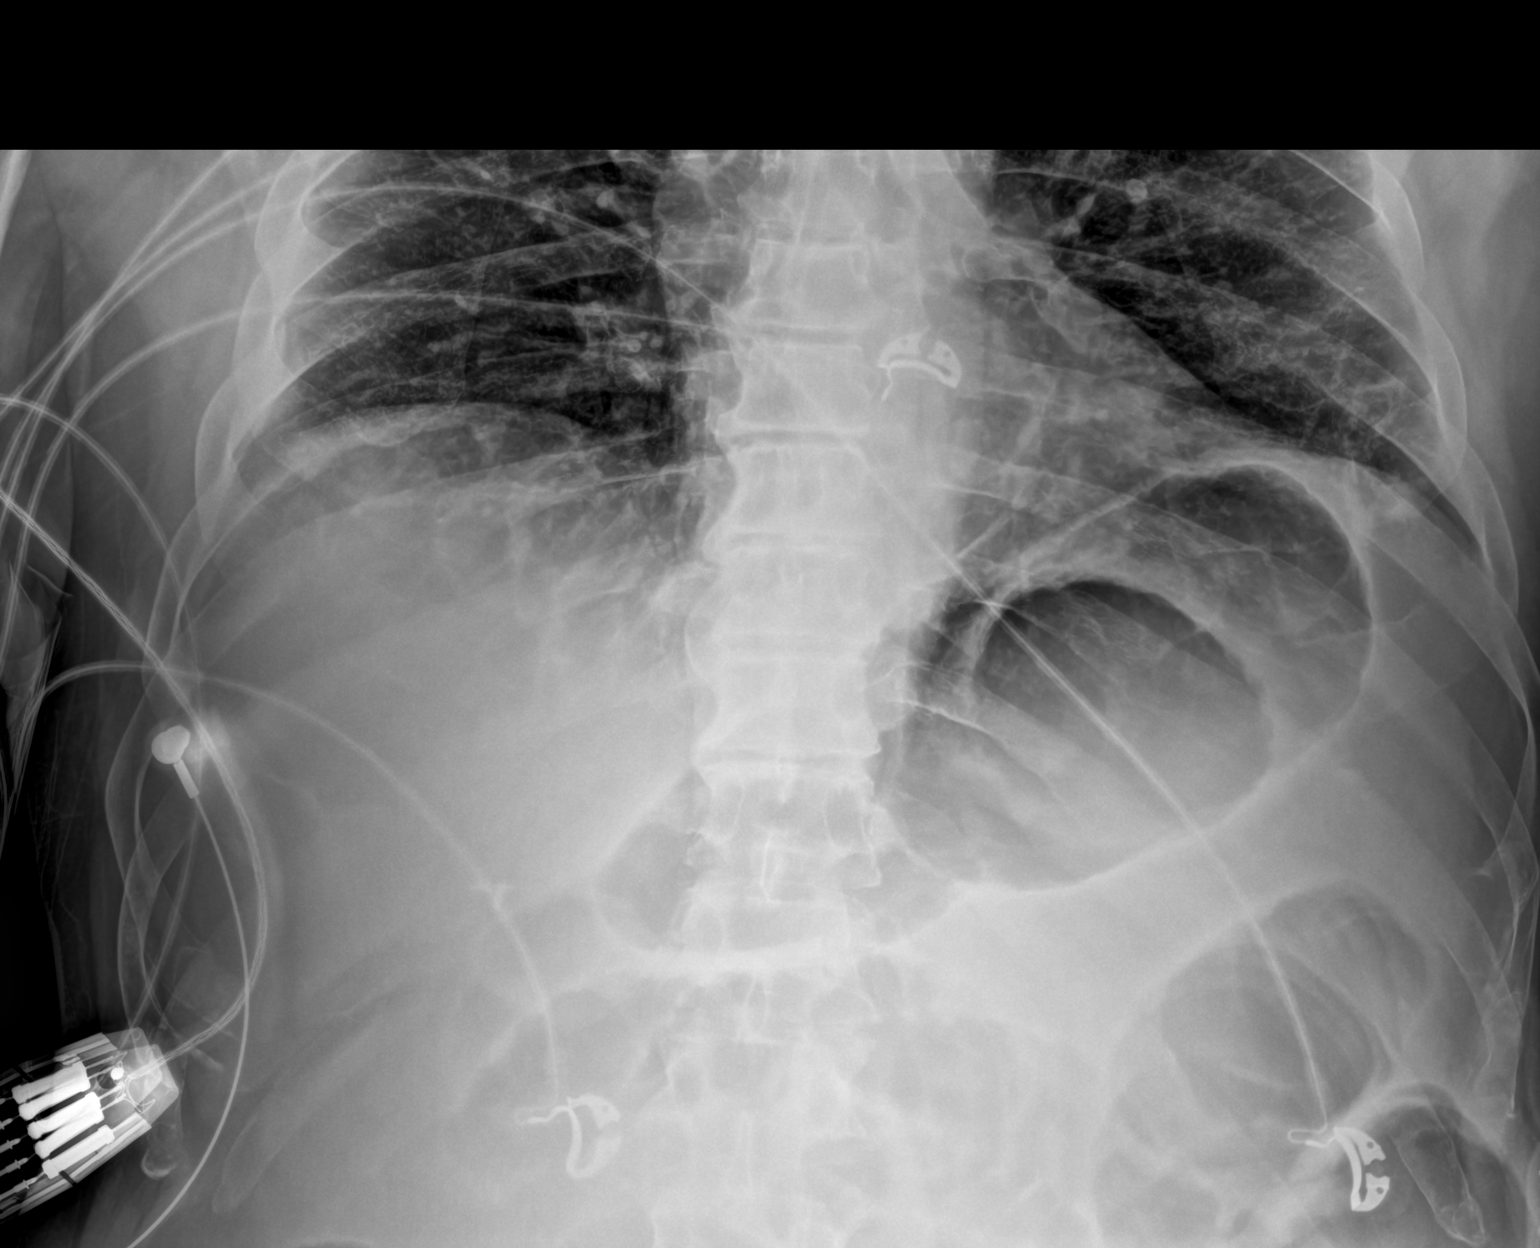

[abdomen supine (1 of 2)]
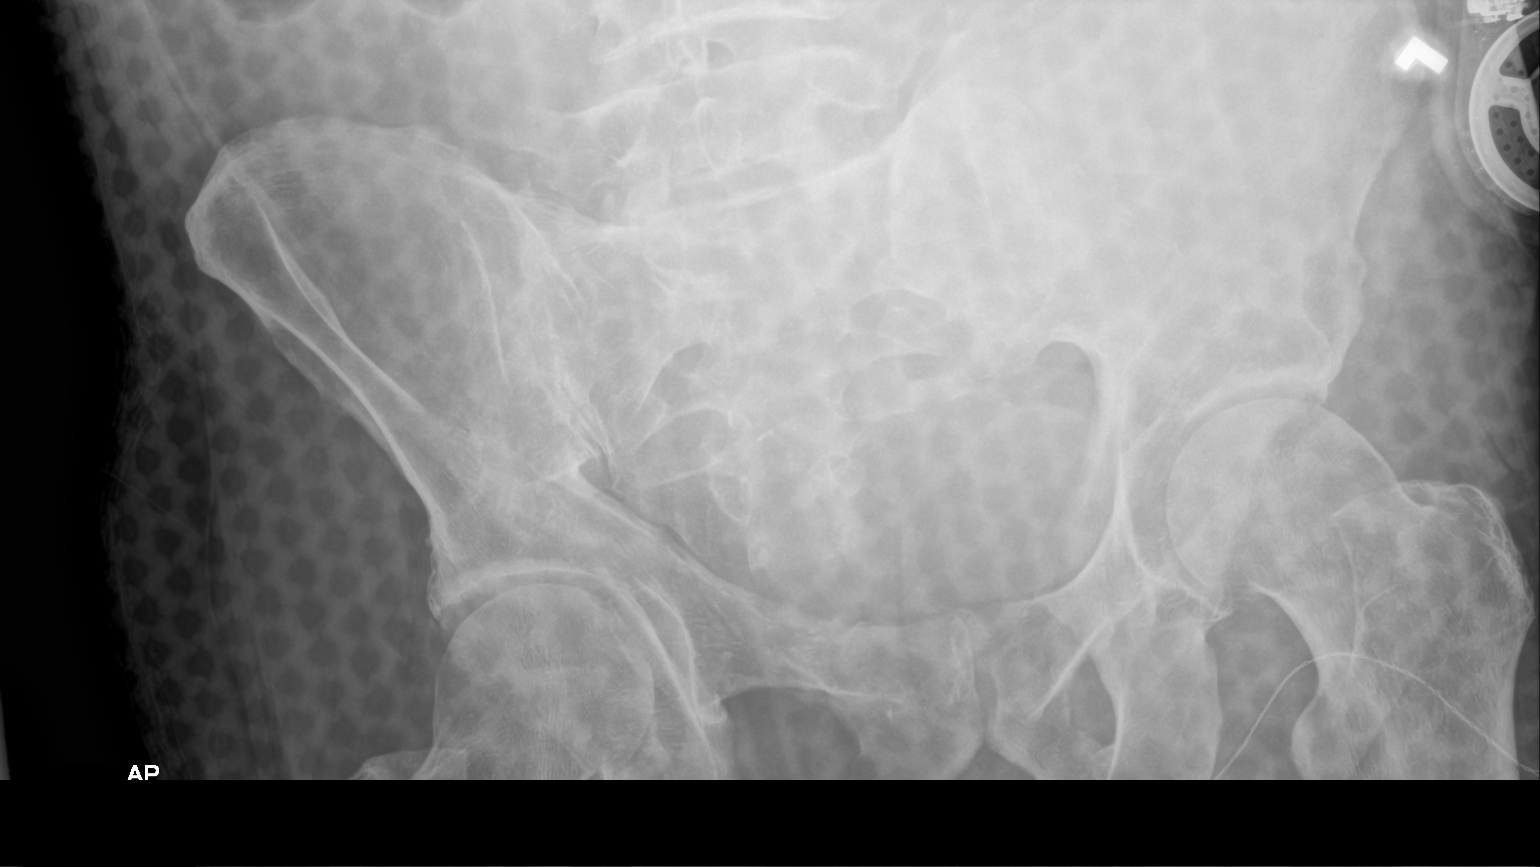

[abdomen supine (2 of 2)]
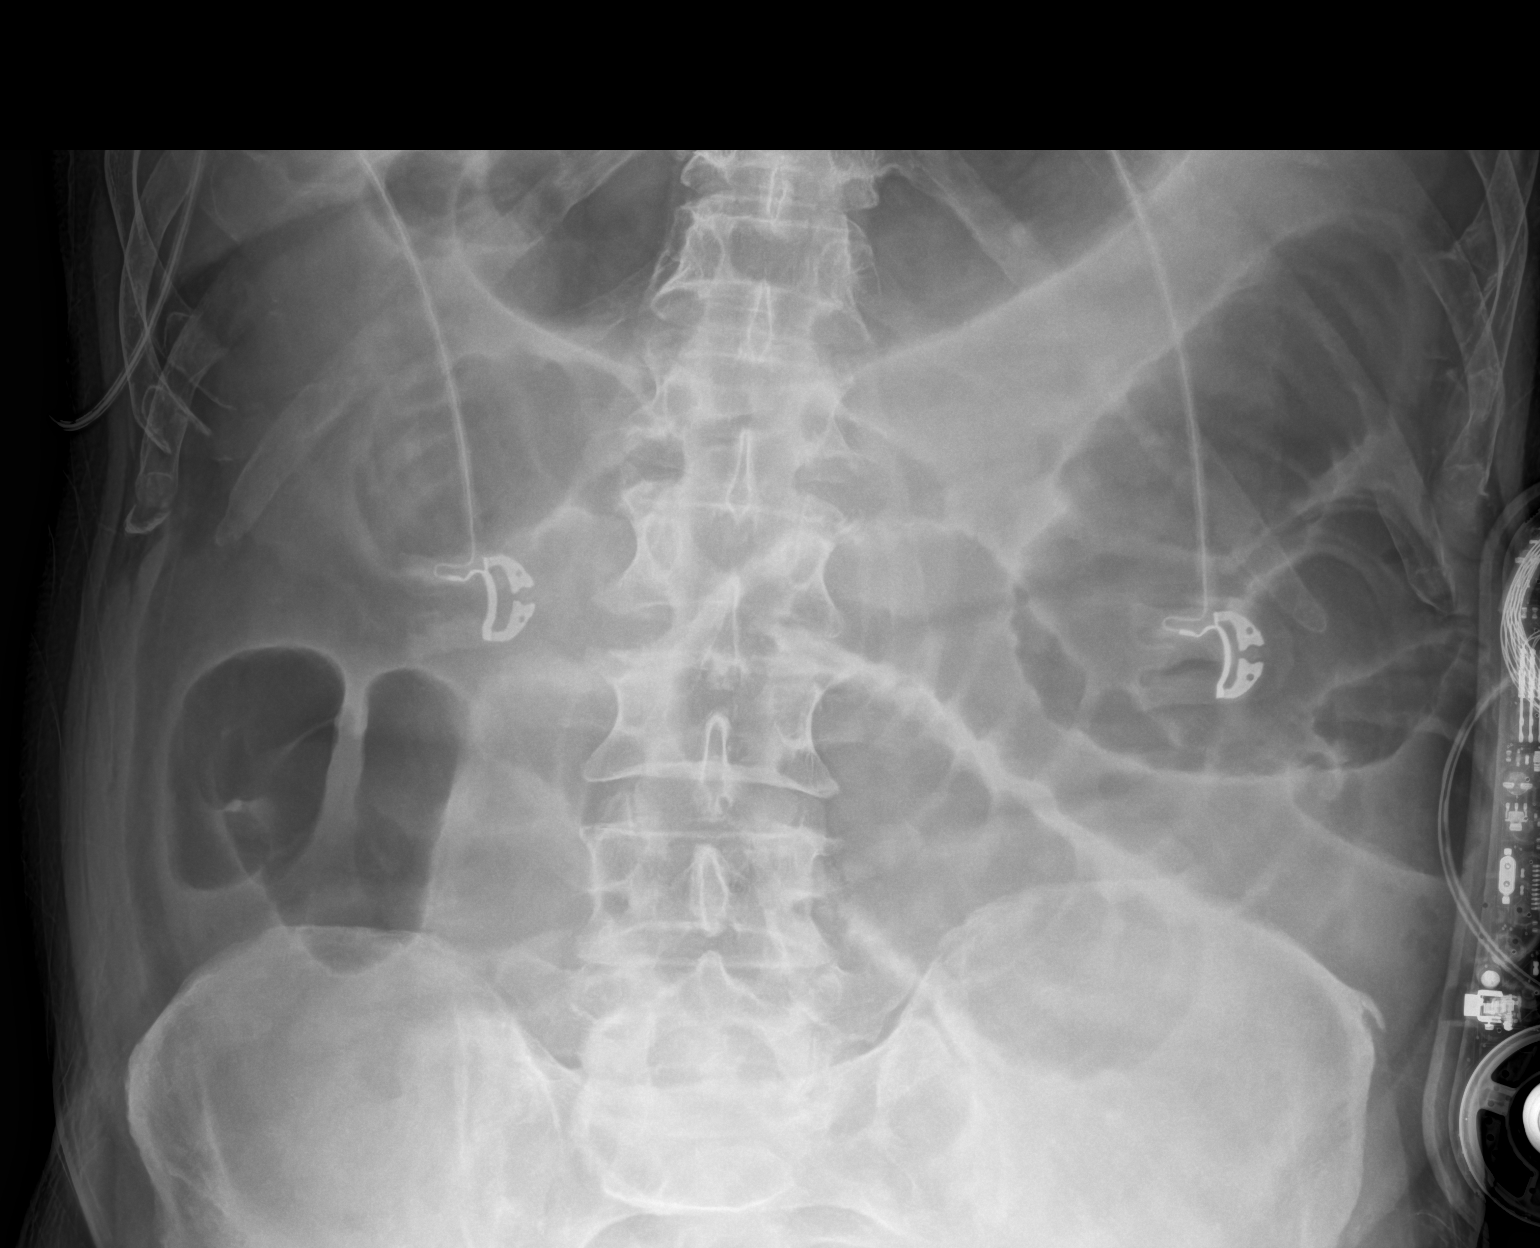

[3 of 3 positions shown; findings below may reference images not displayed]

FINDINGS: Surgical clips right upper quadrant. Persistent prominent small
bowel distention noted. Prior colectomy. No free air. Degenerative
changes lumbar spine and both hips.
IMPRESSION: Persistent prominent small bowel distention. Findings consistent
with persistent small bowel obstruction. Prior colectomy. No free
air.
